# Patient Record
Sex: Male | Born: 1955 | Race: White | Hispanic: No | Marital: Married | Smoking: Never smoker
Health system: Southern US, Community
[De-identification: ages and names within clinical notes are randomized; demographics above are authoritative.]

## PROBLEM LIST (undated history)

## (undated) DIAGNOSIS — E785 Hyperlipidemia, unspecified: Secondary | ICD-10-CM

## (undated) DIAGNOSIS — I10 Essential (primary) hypertension: Secondary | ICD-10-CM

## (undated) DIAGNOSIS — I219 Acute myocardial infarction, unspecified: Secondary | ICD-10-CM

## (undated) DIAGNOSIS — E663 Overweight: Secondary | ICD-10-CM

## (undated) DIAGNOSIS — R361 Hematospermia: Secondary | ICD-10-CM

## (undated) DIAGNOSIS — T8859XA Other complications of anesthesia, initial encounter: Secondary | ICD-10-CM

## (undated) DIAGNOSIS — N529 Male erectile dysfunction, unspecified: Secondary | ICD-10-CM

## (undated) DIAGNOSIS — F32A Depression, unspecified: Secondary | ICD-10-CM

## (undated) DIAGNOSIS — F319 Bipolar disorder, unspecified: Secondary | ICD-10-CM

## (undated) DIAGNOSIS — D352 Benign neoplasm of pituitary gland: Secondary | ICD-10-CM

## (undated) DIAGNOSIS — Z87442 Personal history of urinary calculi: Secondary | ICD-10-CM

## (undated) DIAGNOSIS — M1990 Unspecified osteoarthritis, unspecified site: Secondary | ICD-10-CM

## (undated) DIAGNOSIS — I2510 Atherosclerotic heart disease of native coronary artery without angina pectoris: Secondary | ICD-10-CM

## (undated) HISTORY — DX: Hematospermia: R36.1

## (undated) HISTORY — DX: Male erectile dysfunction, unspecified: N52.9

## (undated) HISTORY — DX: Overweight: E66.3

## (undated) HISTORY — PX: KNEE SURGERY: SHX244

## (undated) HISTORY — DX: Atherosclerotic heart disease of native coronary artery without angina pectoris: I25.10

## (undated) HISTORY — DX: Hyperlipidemia, unspecified: E78.5

## (undated) HISTORY — DX: Benign neoplasm of pituitary gland: D35.2

## (undated) HISTORY — DX: Essential (primary) hypertension: I10

---

## 1997-09-30 DIAGNOSIS — D352 Benign neoplasm of pituitary gland: Secondary | ICD-10-CM

## 1997-09-30 HISTORY — DX: Benign neoplasm of pituitary gland: D35.2

## 2001-09-30 HISTORY — PX: REFRACTIVE SURGERY: SHX103

## 2001-09-30 HISTORY — PX: VASECTOMY: SHX75

## 2005-09-30 HISTORY — PX: OTHER SURGICAL HISTORY: SHX169

## 2006-09-30 HISTORY — PX: COLONOSCOPY: SHX174

## 2008-09-30 HISTORY — PX: HERNIA REPAIR: SHX51

## 2011-06-05 ENCOUNTER — Other Ambulatory Visit: Payer: Self-pay | Admitting: Endocrinology

## 2011-06-05 DIAGNOSIS — D352 Benign neoplasm of pituitary gland: Secondary | ICD-10-CM

## 2012-09-30 DIAGNOSIS — I10 Essential (primary) hypertension: Secondary | ICD-10-CM

## 2012-09-30 HISTORY — DX: Essential (primary) hypertension: I10

## 2012-09-30 HISTORY — PX: CARDIAC CATHETERIZATION: SHX172

## 2013-01-27 ENCOUNTER — Encounter: Payer: Self-pay | Admitting: *Deleted

## 2013-01-27 ENCOUNTER — Ambulatory Visit (INDEPENDENT_AMBULATORY_CARE_PROVIDER_SITE_OTHER): Payer: 59 | Admitting: Cardiology

## 2013-01-27 ENCOUNTER — Encounter: Payer: Self-pay | Admitting: Cardiology

## 2013-01-27 VITALS — BP 144/87 | HR 78 | Ht 70.0 in | Wt 242.0 lb

## 2013-01-27 DIAGNOSIS — R079 Chest pain, unspecified: Secondary | ICD-10-CM

## 2013-01-27 DIAGNOSIS — Z01818 Encounter for other preprocedural examination: Secondary | ICD-10-CM

## 2013-01-27 DIAGNOSIS — I208 Other forms of angina pectoris: Secondary | ICD-10-CM

## 2013-01-27 DIAGNOSIS — I10 Essential (primary) hypertension: Secondary | ICD-10-CM

## 2013-01-27 DIAGNOSIS — D352 Benign neoplasm of pituitary gland: Secondary | ICD-10-CM

## 2013-01-27 NOTE — Patient Instructions (Addendum)
Your physician recommends that you schedule a follow-up appointment in: TO BE DETERMINED FROM TEST  Your physician has requested that you have a stress echocardiogram. For further information please visit [email or web address] Please follow instruction sheet as given.WE WILL CALL YOU WITH RESULTS/NEXT STEPS  Your physician has requested that you have a cardiac catheterization. Cardiac catheterization is used to diagnose and/or treat various heart conditions. Doctors may recommend this procedure for a number of different reasons. The most common reason is to evaluate chest pain. Chest pain can be a symptom of coronary artery disease (CAD), and cardiac catheterization can show whether plaque is narrowing or blocking your heart's arteries. This procedure is also used to evaluate the valves, as well as measure the blood flow and oxygen levels in different parts of your heart. For further information please visit [email or web address] Please follow instruction sheet, as given.  Your physician recommends that you return for lab work in: this week (cbc, bmet,pt, pt/inr) slips printed

## 2013-01-27 NOTE — Progress Notes (Deleted)
Name: Jesse Coleman    DOB: 1956-04-05  Age: 57 y.o.  MR#: [id number]       PCP:  Michiel Sites, MD      Insurance: Payor: Cleatrice Burke  Plan: [place] [employer]  Product Type: *No Product type*    CC:   No chief complaint on file. LIST  VS Filed Vitals:   01/27/13 1317  BP: 144/87  Pulse: 78  Height: 5\' 10"  (1.778 m)  Weight: 242 lb (109.77 kg)    Weights Current Weight  01/27/13 242 lb (109.77 kg)    Blood Pressure  BP Readings from Last 3 Encounters:  01/27/13 144/87     Admit date:  (Not on file) Last encounter with RMR:  Visit date not found   Allergy Latex  Current Outpatient Prescriptions  Medication Sig Dispense Refill  . aspirin EC 81 MG tablet Take 81 mg by mouth daily.      Marland Kitchen atorvastatin (LIPITOR) 40 MG tablet Take 40 mg by mouth daily.       . bromocriptine (PARLODEL) 2.5 MG tablet Take 2.5 mg by mouth daily.       Marland Kitchen LORazepam (ATIVAN) 1 MG tablet Take 1 mg by mouth every 6 (six) hours as needed.       . naproxen (NAPROSYN) 500 MG tablet Take 500 mg by mouth 2 (two) times daily with a meal.      . NITROSTAT 0.4 MG SL tablet Place 0.4 mg under the tongue every 5 (five) minutes as needed.       . vardenafil (LEVITRA) 10 MG tablet Take 10 mg by mouth daily as needed.       No current facility-administered medications for this visit.    Discontinued Meds:   There are no discontinued medications.  There are no active problems to display for this patient.   LABS No results found for this basename: na, k, cl, co2, glucose, bun, creatinine, calcium, gfrnonaa, gfraa   CMP  No results found for this basename: na, k, cl, co2, glucose, bun, creatinine, calcium, prot, albumin, ast, alt, alkphos, bilitot, gfrnonaa, gfraa    No results found for this basename: wbc, hgb, hct, mcv, platelets    Lipid Panel  No results found for this basename: chol, trig, hdl, cholhdl, vldl, ldlcalc    ABG No results found for this basename: phart, pco2, pco2art,  po2, po2art, hco3, tco2, acidbasedef, o2sat     No results found for this basename: TSH   BNP (last 3 results) No results found for this basename: PROBNP,  in the last 8760 hours Cardiac Panel (last 3 results) No results found for this basename: CKTOTAL, CKMB, TROPONINI, RELINDX,  in the last 72 hours  Iron/TIBC/Ferritin No results found for this basename: iron, tibc, ferritin     EKG No orders found for this or any previous visit.   Prior Assessment and Plan    Imaging: No results found.

## 2013-01-28 ENCOUNTER — Encounter (HOSPITAL_COMMUNITY): Payer: Self-pay | Admitting: Cardiology

## 2013-01-28 ENCOUNTER — Ambulatory Visit (HOSPITAL_COMMUNITY)
Admission: RE | Admit: 2013-01-28 | Discharge: 2013-01-28 | Disposition: A | Discharge status: Home / Self Care | Payer: 59 | Source: Ambulatory Visit | Attending: Cardiology | Admitting: Cardiology

## 2013-01-28 ENCOUNTER — Encounter: Payer: Self-pay | Admitting: Cardiology

## 2013-01-28 DIAGNOSIS — R079 Chest pain, unspecified: Secondary | ICD-10-CM | POA: Insufficient documentation

## 2013-01-28 DIAGNOSIS — I10 Essential (primary) hypertension: Secondary | ICD-10-CM | POA: Insufficient documentation

## 2013-01-28 DIAGNOSIS — D352 Benign neoplasm of pituitary gland: Secondary | ICD-10-CM | POA: Insufficient documentation

## 2013-01-28 DIAGNOSIS — I209 Angina pectoris, unspecified: Secondary | ICD-10-CM | POA: Insufficient documentation

## 2013-01-28 NOTE — Progress Notes (Signed)
Stress Lab Nurses Notes - [facility] 01/28/2013 Reason for doing test: Chest Pain Type of test: Stress Echo Nurse performing test: Parke Poisson, RN Nuclear Medicine Tech: Not Applicable Echo Tech: Karrie Doffing MD performing test: R. Dietrich Pates Family MD: Dr. Juleen China Test explained and consent signed: yes IV started: No IV started Symptoms: Chest pain & SOB Treatment/Intervention: None Reason test stopped: fatigue After recovery IV was: NA Patient to return to Nuc. Med at : NA Patient discharged: Home Patient's Condition upon discharge was: stable Comments: During test peak BP 202/82 & HR 141.  Recovery BP 148/88 & HR 88. Symptoms resolved in recovery. Erskine Speed T

## 2013-01-28 NOTE — Progress Notes (Signed)
Patient ID: Jesse Coleman, male   DOB: [birth year], 57 y.o.   MRN: [id number] HPI: Initial Cardiology evaluation for this very nice gentleman referred by Jesse Needle, MD for assessment of exertional angina.  Patient describes a fairly classic history with burning mid substernal chest discomfort elicited reliably by moderate exercise and relieved promptly with rest. He has taken nitroglycerin with perhaps some benefit, but not immediate relief. He denies radiation of discomfort, associated symptoms or any GI symptomatology or history. He has not previously been evaluated by cardiologists except just recently by an associate of Dr. Jacinto Coleman.  Jesse Coleman felt that he is being pressured to undergo cardiac catheterization and is now seeking a second opinion. Symptoms have been present for approximately 6 months and have not been progressive.  Current Outpatient Prescriptions  Medication Sig Dispense Refill  . aspirin EC 81 MG tablet Take 81 mg by mouth daily.      Marland Kitchen atorvastatin (LIPITOR) 40 MG tablet Take 40 mg by mouth daily.       . bromocriptine (PARLODEL) 2.5 MG tablet Take 2.5 mg by mouth daily.       Marland Kitchen LORazepam (ATIVAN) 1 MG tablet Take 1 mg by mouth every 6 (six) hours as needed.       . naproxen (NAPROSYN) 500 MG tablet Take 500 mg by mouth 2 (two) times daily with a meal.      . NITROSTAT 0.4 MG SL tablet Place 0.4 mg under the tongue every 5 (five) minutes as needed.       . vardenafil (LEVITRA) 10 MG tablet Take 10 mg by mouth daily as needed.       No current facility-administered medications for this visit.   Allergies  Allergen Reactions  . Latex     Past Medical History  Diagnosis Date  . Chest pain     History reviewed. No pertinent past surgical history.  History reviewed. No pertinent family history.  History   Social History  . Marital Status: Married    Spouse Name: N/A    Number of Children: N/A  . Years of Education: N/A   Occupational History  . Not on file.   Social  History Main Topics  . Smoking status: Never Smoker   . Smokeless tobacco: Not on file  . Alcohol Use: No  . Drug Use: No  . Sexually Active: Not on file   Other Topics Concern  . Not on file   Social History Narrative  . No narrative on file    ROS: Denies orthopnea, PND, palpitations, lightheadedness or syncope..  All other systems reviewed and are negative.  PHYSICAL EXAM: BP 144/87  Pulse 78  Ht 5\' 10"  (1.778 m)  Wt 109.77 kg (242 lb)  BMI 34.72 kg/m2;  Body mass index is 34.72 kg/(m^2).   General-Well-developed; no acute distress Body Habitus-proportionate weight and height HEENT-[place]/AT; PERRL; EOM intact; conjunctiva and lids nl Neck-No JVD; no carotid bruits Endocrine-No thyromegaly Lungs-Clear lung fields; resonant percussion; normal I-to-E ratio Cardiovascular- normal PMI; normal S1 and S2; systolic ejection murmur Abdomen-BS normal; soft and non-tender without masses or organomegaly Musculoskeletal-No deformities, cyanosis or clubbing Neurologic-Nl cranial nerves; symmetric strength and tone Skin- Warm, no significant lesions Extremities-Nl distal pulses; no edema  EKG:  Tracing performed 01/18/13 obtained and reviewed. Normal sinus rhythm present; borderline left atrial abnormality; slightly delayed R-wave progression; T-wave inversion in leads V3-V4 raising the question of anterior ischemia. No previous tracing for comparison.  Medicine Bow Bing, MD 01/28/2013  9:29 AM  ASSESSMENT AND PLAN

## 2013-01-28 NOTE — Assessment & Plan Note (Addendum)
Current symptoms are highly suggestive of classic angina and thus suggest a very high likelihood of coronary artery disease. The pathophysiology of CAD and implications of developing it were discussed at length with patient and his wife. Possible next steps for testing including stress testing and cardiac catheterization were also considered in detail. Patient is very cognizant of the premature death of his father at an age only one year greater than his current age, and is inclined to ultimately undergo coronary angiography; however, he would feel more comfortable starting with a stress test, which will be performed within the next few days.  I anticipate that the results of that study will be positive then we will proceed expeditiously to an outpatient catheterization. In the meantime, cardiovascular risk factors will be controlled. He's recently been started on moderate doses of atorvastatin, which should be adequate. He is taking aspirin daily. Criteria for pharmacologic treatment of hypertension have been borderline, but a beta blocker will be added once stress testing has been completed.

## 2013-01-28 NOTE — Progress Notes (Signed)
*  PRELIMINARY RESULTS* Echocardiogram 2D Echocardiogram has been performed.  Conrad Atwood 01/28/2013, 10:38 AM

## 2013-01-29 ENCOUNTER — Ambulatory Visit (HOSPITAL_COMMUNITY)
Admission: RE | Admit: 2013-01-29 | Discharge: 2013-01-29 | Disposition: A | Discharge status: Home / Self Care | Payer: 59 | Source: Ambulatory Visit | Attending: Cardiology | Admitting: Cardiology

## 2013-01-29 ENCOUNTER — Encounter: Payer: Self-pay | Admitting: *Deleted

## 2013-01-29 ENCOUNTER — Telehealth: Payer: Self-pay | Admitting: *Deleted

## 2013-01-29 DIAGNOSIS — R079 Chest pain, unspecified: Secondary | ICD-10-CM | POA: Insufficient documentation

## 2013-01-29 NOTE — Telephone Encounter (Signed)
Spoke to pt to advise results/instructions. Pt understood. Pt stated he would come to the office to pick up the packet and have tests performed

## 2013-01-29 NOTE — Telephone Encounter (Signed)
.  left message to have patient return my call to advise Dr RR would like for him to have a Left Heart Cath with [facility] on 02-02-13 at 7:30am, with Dr Dolphus Jenny, pt will need to pick up instruction letter, lab slips and chest x ray slip to have performed today or tomorrow to allow time for results to come back prior to cath apt, placed pt packet at front desk for pick up

## 2013-01-29 NOTE — Addendum Note (Signed)
Addended by: Derry Lory A on: 01/29/2013 12:05 PM   Modules accepted: Orders

## 2013-01-30 LAB — BASIC METABOLIC PANEL
BUN: 13 mg/dL (ref 6–23)
CO2: 26 mEq/L (ref 19–32)
Calcium: 9.4 mg/dL (ref 8.4–10.5)
Chloride: 106 mEq/L (ref 96–112)
Creat: 1.19 mg/dL (ref 0.50–1.35)
Glucose, Bld: 87 mg/dL (ref 70–99)
Potassium: 4.2 mEq/L (ref 3.5–5.3)
Sodium: 139 mEq/L (ref 135–145)

## 2013-01-30 LAB — CBC
HCT: 43 % (ref 39.0–52.0)
Hemoglobin: 15.1 g/dL (ref 13.0–17.0)
MCH: 30.5 pg (ref 26.0–34.0)
MCHC: 35.1 g/dL (ref 30.0–36.0)
MCV: 86.9 fL (ref 78.0–100.0)
Platelets: 178 10*3/uL (ref 150–400)
RBC: 4.95 MIL/uL (ref 4.22–5.81)
RDW: 13.5 % (ref 11.5–15.5)
WBC: 5.7 10*3/uL (ref 4.0–10.5)

## 2013-01-30 LAB — PROTIME-INR
INR: 1.05 (ref <1.50)
Prothrombin Time: 13.7 seconds (ref 11.6–15.2)

## 2013-01-30 LAB — APTT: aPTT: 47 seconds — ABNORMAL HIGH (ref 24–37)

## 2013-02-01 ENCOUNTER — Encounter: Payer: Self-pay | Admitting: *Deleted

## 2013-02-02 ENCOUNTER — Encounter (HOSPITAL_BASED_OUTPATIENT_CLINIC_OR_DEPARTMENT_OTHER): Payer: Self-pay | Admitting: *Deleted

## 2013-02-02 ENCOUNTER — Telehealth: Payer: Self-pay | Admitting: Physician Assistant

## 2013-02-02 ENCOUNTER — Inpatient Hospital Stay (HOSPITAL_BASED_OUTPATIENT_CLINIC_OR_DEPARTMENT_OTHER)
Admission: RE | Admit: 2013-02-02 | Discharge: 2013-02-02 | Disposition: A | Discharge status: Home / Self Care | Payer: 59 | Source: Ambulatory Visit | Attending: Cardiovascular Disease | Admitting: Cardiovascular Disease

## 2013-02-02 ENCOUNTER — Encounter (HOSPITAL_BASED_OUTPATIENT_CLINIC_OR_DEPARTMENT_OTHER)
Admission: RE | Disposition: A | Discharge status: Home / Self Care | Payer: Self-pay | Source: Ambulatory Visit | Attending: Cardiovascular Disease

## 2013-02-02 DIAGNOSIS — I2510 Atherosclerotic heart disease of native coronary artery without angina pectoris: Secondary | ICD-10-CM | POA: Insufficient documentation

## 2013-02-02 DIAGNOSIS — I519 Heart disease, unspecified: Secondary | ICD-10-CM | POA: Insufficient documentation

## 2013-02-02 DIAGNOSIS — I10 Essential (primary) hypertension: Secondary | ICD-10-CM | POA: Diagnosis present

## 2013-02-02 DIAGNOSIS — R079 Chest pain, unspecified: Secondary | ICD-10-CM | POA: Diagnosis present

## 2013-02-02 DIAGNOSIS — I209 Angina pectoris, unspecified: Secondary | ICD-10-CM | POA: Insufficient documentation

## 2013-02-02 SURGERY — JV LEFT HEART CATHETERIZATION WITH CORONARY ANGIOGRAM
Anesthesia: Moderate Sedation

## 2013-02-02 MED ORDER — ONDANSETRON HCL 4 MG/2ML IJ SOLN: 4.0000 mg | Freq: Four times a day (QID) | INTRAMUSCULAR | Status: DC | PRN

## 2013-02-02 MED ORDER — ASPIRIN 81 MG PO CHEW
324.0000 mg | CHEWABLE_TABLET | ORAL | Status: AC
  Administered 2013-02-02: 324 mg via ORAL

## 2013-02-02 MED ORDER — SODIUM CHLORIDE 0.9 % IV SOLN: 250.0000 mL | INTRAVENOUS | Status: DC | PRN

## 2013-02-02 MED ORDER — SODIUM CHLORIDE 0.9 % IV SOLN: INTRAVENOUS | Status: AC

## 2013-02-02 MED ORDER — ACETAMINOPHEN 325 MG PO TABS: 650.0000 mg | ORAL_TABLET | ORAL | Status: DC | PRN

## 2013-02-02 MED ORDER — SODIUM CHLORIDE 0.9 % IJ SOLN: 3.0000 mL | INTRAMUSCULAR | Status: DC | PRN

## 2013-02-02 MED ORDER — SODIUM CHLORIDE 0.9 % IJ SOLN: 3.0000 mL | Freq: Two times a day (BID) | INTRAMUSCULAR | Status: DC

## 2013-02-02 MED ORDER — SODIUM CHLORIDE 0.9 % IV SOLN
INTRAVENOUS | Status: DC
  Administered 2013-02-02: 08:00:00 via INTRAVENOUS

## 2013-02-02 NOTE — Interval H&P Note (Signed)
History and Physical Interval Note:  02/02/2013 8:17 AM  Jesse Coleman  has presented today for cardiac cath with abnormal stress echo, UA. Possible PCI.  The various methods of treatment have been discussed with the patient and family. After consideration of risks, benefits and other options for treatment, the patient has consented to  Procedure(s): JV LEFT HEART CATHETERIZATION WITH CORONARY ANGIOGRAM (N/A) as a surgical intervention .  The patient's history has been reviewed, patient examined, no change in status, stable for surgery.  I have reviewed the patient's chart and labs.  Questions were answered to the patient's satisfaction.     MCALHANY,CHRISTOPHER

## 2013-02-02 NOTE — CV Procedure (Signed)
Cardiac Catheterization Operative Report  Jesse Coleman [id number] 5/6/20148:58 AM Michiel Sites, MD  Procedure Performed:  1. Left Heart Catheterization 2. Selective Coronary Angiography 3. Left ventricular angiogram  Operator: Verne Carrow, MD  Indication:   57 yo male with history of recent chest pains with exertion. Stress echo with LVEF 40%, no worsening of EF with exercise but possible wall motion abnormalities with exercise. Cardiac cath to exclude obstructive CAD.                                    Procedure Details: The risks, benefits, complications, treatment options, and expected outcomes were discussed with the patient. The patient and/or family concurred with the proposed plan, giving informed consent. The patient was brought to the cath lab after IV hydration was begun and oral premedication was given. The patient was further sedated with Versed and Fentanyl. The right groin was prepped and draped in the usual manner. Using the modified Seldinger access technique, a 5 French sheath was placed in the right femoral artery. The RCA had a high, anterior takeoff and was engaged with an AL-1 catheter. The left main was engaged with a JL5 catheter. Selective angiography was performed. A pigtail catheter was used to perform a left ventricular angiogram.  There were no immediate complications. The patient was taken to the recovery area in stable condition.   Hemodynamic Findings: Central aortic pressure: 131/68 Left ventricular pressure: 135/12/23  Angiographic Findings:  Left main: No obstructive disease.   Left Anterior Descending Artery: Large caliber vessel that courses to the apex. The entire proximal and mid vessel has severe calcification. There is diffuse 40% stenosis throughout the proximal and mid vessel but this does not appear to be flow limiting. The distal LAD becomes smaller in caliber and has diffuse 30% stenosis. There are 3 very small caliber  diagonal branches. The first two diagonal branches are 1.0 mm in diameter and each has 70-80% ostial stenosis (too small for PCI).   Circumflex Artery: Large caliber vessel with 20% proximal stenosis. The AV groove Circumflex leads into a moderate caliber left sided posterolateral branch with mild plaque. The first obtuse marginal branch is very small in caliber (1. 0 mm) and has diffuse 90% proximal stenosis. The second obtuse marginal branch is very small in caliber and has severe distal stenosis. (too small for PCI). The third obtuse marginal branch is small to moderate in caliber and has proximal 50% stenosis, mod 40% stenosis.   Right Coronary Artery: Small caliber, non-dominant vessel with 90% proximal stenosis. This vessel appears to be 1.5-1.75 mm in caliber (too small for PCI)  Left Ventricular Angiogram: LVEF 40-45%, global hypokinesis.   Impression: 1. 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 The RCA is small and non-dominant. The branches with high grade disease are too small for PCI.  2. Mild to moderate LV systolic dysfunction  Recommendations: His major epicardial vessels are free of flow limiting disease. His angina is likely from the high grade disease in the very small caliber branch vessels. Will continue medical management. Will add Imdur 30 mg po QHS and Toprol 50 mg po Qdaily. He will need to stop using Levitra while he is on Imdur.        Complications:  None. The patient tolerated the procedure well.

## 2013-02-02 NOTE — Progress Notes (Signed)
Bedrest begins @ 0910, tegaderm dressing applied to right groin bt Army Melia, site level 0.

## 2013-02-02 NOTE — Telephone Encounter (Addendum)
Patient had cardiac catheterization today and was discharged home. He coughed this evening and about a half hour ago, his wife noticed purple ecchymosis coming out very slightly (<1cm) from the top corner and bottom corner of the R side of the gauze. The gauze itself is completely white. There is no seepage of blood. They have not seen any groin swelling. He has no groin pain/discomfort, dizziness, lightheadedness or any other symptoms. He was instructed to leave pressure dressing on until morning so they have not disturbed it. This ecchymosis is nonpalpable and has not changed within the last half hour. She volunteered without any request to send in a picture which we reviewed and promptly deleted, appears to be ecchymosis flush with the skin. I advised that he should remain on bedrest for this evening, avoid any lifting whatsoever, try to avoid coughing, and if he does cough, to hold pressure on his groin and cough very gently. If there is any progression of bruising, any change in being able to palpate the bruising, any groin swelling, staining of gauze with blood, dizziness, groin pain, back/abdominal pain, or any other symptoms concerning to them, they are to proceed to ED. I also told her that if she is welcome to bring him to the ER regardless to have it looked at. She has elected to remain at home with very careful monitoring of the catheterization site, and if there are any changes, they know to go to the ER. She will keep the pressure dressing in place. I also told her to please call our office in the morning to let us know how he is doing. She verbalized understanding and gratitude. Dayna Dunn PA-C   ADDENDUM 02/03/13:  Called patient to see how he was doing. Ecchymosis was marginally larger this morning compared to last night, but no further progression since this morning. They took his bandage off and there was no oozing or swelling. No groin pain. He is taking it easy. I advised him again of the  warning signs above, but told him to continue to monitor and we should hope for improvement. He knows to call back if there is any change from current state. Dayna Dunn PA-C

## 2013-02-02 NOTE — H&P (View-Only) (Signed)
Patient ID: Jesse Coleman, male   DOB: 10/17/1955, 57 y.o.   MRN: [id number] HPI: Initial Cardiology evaluation for this very nice gentleman referred by Walter Kohut, MD for assessment of exertional angina.  Patient describes a fairly classic history with burning mid substernal chest discomfort elicited reliably by moderate exercise and relieved promptly with rest. He has taken nitroglycerin with perhaps some benefit, but not immediate relief. He denies radiation of discomfort, associated symptoms or any GI symptomatology or history. He has not previously been evaluated by cardiologists except just recently by an associate of Dr. Ganji.  Kendrew felt that he is being pressured to undergo cardiac catheterization and is now seeking a second opinion. Symptoms have been present for approximately 6 months and have not been progressive.  Current Outpatient Prescriptions  Medication Sig Dispense Refill  . aspirin EC 81 MG tablet Take 81 mg by mouth daily.      . atorvastatin (LIPITOR) 40 MG tablet Take 40 mg by mouth daily.       . bromocriptine (PARLODEL) 2.5 MG tablet Take 2.5 mg by mouth daily.       . LORazepam (ATIVAN) 1 MG tablet Take 1 mg by mouth every 6 (six) hours as needed.       . naproxen (NAPROSYN) 500 MG tablet Take 500 mg by mouth 2 (two) times daily with a meal.      . NITROSTAT 0.4 MG SL tablet Place 0.4 mg under the tongue every 5 (five) minutes as needed.       . vardenafil (LEVITRA) 10 MG tablet Take 10 mg by mouth daily as needed.       No current facility-administered medications for this visit.   Allergies  Allergen Reactions  . Latex     Past Medical History  Diagnosis Date  . Chest pain     History reviewed. No pertinent past surgical history.  History reviewed. No pertinent family history.  History   Social History  . Marital Status: Married    Spouse Name: N/A    Number of Children: N/A  . Years of Education: N/A   Occupational History  . Not on file.   Social  History Main Topics  . Smoking status: Never Smoker   . Smokeless tobacco: Not on file  . Alcohol Use: No  . Drug Use: No  . Sexually Active: Not on file   Other Topics Concern  . Not on file   Social History Narrative  . No narrative on file    ROS: Denies orthopnea, PND, palpitations, lightheadedness or syncope..  All other systems reviewed and are negative.  PHYSICAL EXAM: BP 144/87  Pulse 78  Ht 5' 10" (1.778 m)  Wt 109.77 kg (242 lb)  BMI 34.72 kg/m2;  Body mass index is 34.72 kg/(m^2).   General-Well-developed; no acute distress Body Habitus-proportionate weight and height HEENT-[place]/AT; PERRL; EOM intact; conjunctiva and lids nl Neck-No JVD; no carotid bruits Endocrine-No thyromegaly Lungs-Clear lung fields; resonant percussion; normal I-to-E ratio Cardiovascular- normal PMI; normal S1 and S2; systolic ejection murmur Abdomen-BS normal; soft and non-tender without masses or organomegaly Musculoskeletal-No deformities, cyanosis or clubbing Neurologic-Nl cranial nerves; symmetric strength and tone Skin- Warm, no significant lesions Extremities-Nl distal pulses; no edema  EKG:  Tracing performed 01/18/13 obtained and reviewed. Normal sinus rhythm present; borderline left atrial abnormality; slightly delayed R-wave progression; T-wave inversion in leads V3-V4 raising the question of anterior ischemia. No previous tracing for comparison.  Brynn Reznik, MD 01/28/2013  9:29 AM    ASSESSMENT AND PLAN  

## 2013-02-02 NOTE — Progress Notes (Signed)
Pt alert and oriented.  Dr Alyson Ingles in to talk to pt and family about the results of test and plan of care.  New RX given to CG for pt.

## 2013-02-05 ENCOUNTER — Emergency Department (HOSPITAL_COMMUNITY)
Admission: EM | Admit: 2013-02-05 | Discharge: 2013-02-05 | Disposition: A | Discharge status: Home / Self Care | Payer: 59 | Attending: Emergency Medicine | Admitting: Emergency Medicine

## 2013-02-05 ENCOUNTER — Encounter (HOSPITAL_COMMUNITY): Payer: Self-pay | Admitting: Emergency Medicine

## 2013-02-05 ENCOUNTER — Telehealth: Payer: Self-pay | Admitting: Physician Assistant

## 2013-02-05 ENCOUNTER — Telehealth: Payer: Self-pay | Admitting: Cardiology

## 2013-02-05 DIAGNOSIS — IMO0002 Reserved for concepts with insufficient information to code with codable children: Secondary | ICD-10-CM | POA: Insufficient documentation

## 2013-02-05 DIAGNOSIS — Z7982 Long term (current) use of aspirin: Secondary | ICD-10-CM | POA: Insufficient documentation

## 2013-02-05 DIAGNOSIS — Y838 Other surgical procedures as the cause of abnormal reaction of the patient, or of later complication, without mention of misadventure at the time of the procedure: Secondary | ICD-10-CM | POA: Insufficient documentation

## 2013-02-05 DIAGNOSIS — Z9889 Other specified postprocedural states: Secondary | ICD-10-CM | POA: Insufficient documentation

## 2013-02-05 DIAGNOSIS — Z79899 Other long term (current) drug therapy: Secondary | ICD-10-CM | POA: Insufficient documentation

## 2013-02-05 DIAGNOSIS — I10 Essential (primary) hypertension: Secondary | ICD-10-CM | POA: Insufficient documentation

## 2013-02-05 DIAGNOSIS — Z87448 Personal history of other diseases of urinary system: Secondary | ICD-10-CM | POA: Insufficient documentation

## 2013-02-05 DIAGNOSIS — T819XXA Unspecified complication of procedure, initial encounter: Secondary | ICD-10-CM

## 2013-02-05 DIAGNOSIS — E669 Obesity, unspecified: Secondary | ICD-10-CM | POA: Insufficient documentation

## 2013-02-05 DIAGNOSIS — T148XXA Other injury of unspecified body region, initial encounter: Secondary | ICD-10-CM

## 2013-02-05 NOTE — ED Provider Notes (Signed)
History     CSN: [id number]  Arrival date & time 02/05/13  1211   First MD Initiated Contact with Patient 02/05/13 1231      Chief Complaint  Patient presents with  . Bleeding/Bruising    (Consider location/radiation/quality/duration/timing/severity/associated sxs/prior treatment) HPI  57 y/o male presents to the ed with concern for his surgical site  After cardiac catheterization on May 6 of 2014.  Patient denies any pain, swelling, tenderness, discharge from the surgical site.  The patient denies any numbness or tingling in his leg.  He denies any shortness of breath, fever, chest pain, nausea, vomiting or diaphoresis.  It does have some abrasion and bruising which has spread laterally out from the groin and over the hip.  Patient states that he was working out yesterday.  He will one-mile outside and then walked 1 mile on the treadmill at a quick pace.  Denies any chest pain or burning at that time.  Patient was concerned that because of the increased bruising the clot at the site of incision may have dislodged.  Past Medical History  Diagnosis Date  . Chest pain     12/2012: Nl CXR  . Hypertension 2014    Lipid profile in 12/2012:190, 104, 65, 104; recent blood pressures marginally elevated  . Prolactin-secreting pituitary adenoma 1999    Medical therapy-1999  . Erectile dysfunction   . Overweight   . Hematospermia   . H/O knee surgery 1989    Past Surgical History  Procedure Laterality Date  . Hernia repair  [address] [place]  . Colonoscopy  [address], [place]  . Dental implant  2007  . Refractive surgery Right 2003  . Vasectomy  2003    Family History  Problem Relation Age of Onset  . Coronary artery disease Father     History  Substance Use Topics  . Smoking status: Never Smoker   . Smokeless tobacco: Not on file  . Alcohol Use: 0.5 oz/week    1 drink(s) per week      Review of Systems Ten systems reviewed and are negative for  acute change, except as noted in the HPI.   Allergies  Latex  Home Medications   Current Outpatient Rx  Name  Route  Sig  Dispense  Refill  . aspirin EC 81 MG tablet   Oral   Take 81 mg by mouth daily.         Marland Kitchen atorvastatin (LIPITOR) 40 MG tablet   Oral   Take 40 mg by mouth daily.          . bromocriptine (PARLODEL) 2.5 MG tablet   Oral   Take 2.5 mg by mouth daily.          Marland Kitchen LORazepam (ATIVAN) 1 MG tablet   Oral   Take 1 mg by mouth every 6 (six) hours as needed.          . naproxen (NAPROSYN) 500 MG tablet   Oral   Take 500 mg by mouth 2 (two) times daily with a meal.         . NITROSTAT 0.4 MG SL tablet   Sublingual   Place 0.4 mg under the tongue every 5 (five) minutes as needed.            BP 134/78  Pulse 78  Temp(Src) 98.5 F (36.9 C)  Resp 16  SpO2 99%  Physical Exam  Nursing note and vitals reviewed.  Constitutional: He appears well-developed and well-nourished. No distress.  HENT:  Head: Normocephalic and atraumatic.  Eyes: Conjunctivae are normal. No scleral icterus.  Neck: Normal range of motion. Neck supple.  Cardiovascular: Normal rate, regular rhythm and normal heart sounds.   Pulmonary/Chest: Effort normal and breath sounds normal. No respiratory distress.  Abdominal: Soft. There is no tenderness.  Genitourinary:     Musculoskeletal: He exhibits no edema.  Neurological: He is alert.  Skin: Skin is warm and dry. He is not diaphoretic.  Psychiatric: His behavior is normal.    ED Course  Procedures (including critical care time)  Labs Reviewed - No data to display No results found.   No diagnosis found.    MDM  1:19 PM BP 137/85  Pulse 63  Temp(Src) 98.5 F (36.9 C)  Resp 16  SpO2 97% Patient with bruising at site of cath in t right groin no pai. No concern for hematoma, seroma, or pseudoaneurysm. No cp/ SOB. No signs of infection. I have advisted tha patient that he should not be exercising unless directed  otherwise by his cardiologist.   The patient appears reasonably screened and/or stabilized for discharge and I doubt any other medical condition or other [facility] requiring further screening, evaluation, or treatment in the ED at this time prior to discharge.        Arthor Captain, PA-C 02/05/13 1321

## 2013-02-05 NOTE — Telephone Encounter (Addendum)
Pt's wife contacted me on my cell phone this morning (which was the phone I had used to call her back the night I was on call) to inquire how to be seen for persistent groin bruising. I attempted to call her back at the number provided but got voicemail twice. On second attempt, I left message instructing her to contact the [place] office for further instructions as he may be able to be seen in office today. Dayna Dunn PA-C  Addendum: was able to reach wife. They have called the [place] office and are awaiting further instructions from their office. Dayna Dunn PA-C

## 2013-02-05 NOTE — Telephone Encounter (Signed)
See prior phone note. Pt's wife contacted me on my cell phone this morning (which was the phone I had used to call her back the night I was on call) to inquire how to be seen for persistent groin bruising. She contacted the  office as I instructed but was waiting to hear back. I then received a followup text and picture of bruising to my cell phone, which the patient stated was per our office's instructions. I spoke with Cala Bradford at the [place] office to confirm that he definitely needs to be seen today to lay the issue to rest since his bruising has persisted and extended. I also explained to the patient's wife that because our cell phones are not linked to the medical record, we generally do not communicate with patients via cell phone/text unless absolutely necessary, thus I will defer further communication to the [place] office. Cala Bradford will speak with Dr. Tenny Craw regarding possible appointment today vs going to ER to be seen. Lashaya Kienitz PA-C

## 2013-02-05 NOTE — ED Notes (Signed)
Had a cardiac cath on 5/6 now having bruising and swelling rt groin pain

## 2013-02-05 NOTE — Telephone Encounter (Signed)
PT HAS CATH ON 02/02/13 AND THE BRUISING IS SPREADING FROM SIGHT TO ACROSS THE HIP AND IN THROUGH THE THIGH AREA, DEEP DARK COLOR. NO PAIN. NO FEVER. JUST VERY BIG.

## 2013-02-05 NOTE — Telephone Encounter (Signed)
Pt wife denies chest pain, SOB,no other sxs, notes BP is running normal, notes bruise is traveling linear, notes this started the night of the procedure, noted inch and a half from port area, discussed with Annabelle Harman on 02-02-13 after discharge and picture sent to [facility] via text, this nurse called Annabelle Harman and she reviewed the picture via test that was seen a few minutes ago, noted pt needs to be seen today and was advised if no apt available for the pt to be seen in the ED, called pt wife and advised our schedule is full and Annabelle Harman reviewed recent picture and advised if pt can not be seen pt needs to evaluated today in the ER, pt agreed and will go to [facility] ER, advised if pt sxs worsen closer to [facility] to please come here, pt wife understood and will call office back with any further assistance if needed

## 2013-02-05 NOTE — ED Notes (Signed)
Pt d/c home in NAD. Pt offered wheelchair to d/c, refused. Pt ambulated with steady gait. NAD noted. Pt verbalized understanding of d/c instructions and importance of follow-up.

## 2013-02-05 NOTE — ED Notes (Signed)
Patient had a cardiac cath done on Feb 02, 2013 at the heart center here at [facility].  The patient said he started working out today and noticed he has some bruising to his right groin.  The patient said he had some bruising to the site, but now it has spread to the right leg and some of his upper thigh.  The patient denies pain.  The bruising appears to be in different stages of healing.

## 2013-02-06 NOTE — ED Provider Notes (Signed)
Medical screening examination/treatment/procedure(s) were performed by non-physician practitioner and as supervising physician I was immediately available for consultation/collaboration.   Benny Lennert, MD 02/06/13 1247

## 2013-02-16 ENCOUNTER — Ambulatory Visit (INDEPENDENT_AMBULATORY_CARE_PROVIDER_SITE_OTHER): Payer: 59 | Admitting: Urology

## 2013-02-16 ENCOUNTER — Encounter: Payer: Self-pay | Admitting: Adult Health

## 2013-02-16 ENCOUNTER — Ambulatory Visit (INDEPENDENT_AMBULATORY_CARE_PROVIDER_SITE_OTHER): Payer: 59 | Admitting: Adult Health

## 2013-02-16 VITALS — BP 140/76 | HR 78 | Ht 70.0 in | Wt 243.0 lb

## 2013-02-16 DIAGNOSIS — I10 Essential (primary) hypertension: Secondary | ICD-10-CM

## 2013-02-16 DIAGNOSIS — N529 Male erectile dysfunction, unspecified: Secondary | ICD-10-CM

## 2013-02-16 DIAGNOSIS — R079 Chest pain, unspecified: Secondary | ICD-10-CM

## 2013-02-16 DIAGNOSIS — R361 Hematospermia: Secondary | ICD-10-CM

## 2013-02-16 MED ORDER — ATORVASTATIN CALCIUM 40 MG PO TABS: 40.0000 mg | ORAL_TABLET | Freq: Every day | ORAL | Status: DC

## 2013-02-16 MED ORDER — METOPROLOL SUCCINATE ER 50 MG PO TB24: 50.0000 mg | ORAL_TABLET | Freq: Every day | ORAL | Status: DC

## 2013-02-16 MED ORDER — RANOLAZINE ER 500 MG PO TB12: 500.0000 mg | ORAL_TABLET | Freq: Two times a day (BID) | ORAL | Status: DC

## 2013-02-16 NOTE — Assessment & Plan Note (Signed)
Slightly elevated on this visit. Will not change any medications unless I see that BP remains elevated on future visits. He is also followed by Dr. Juleen China as PCP. If necessary, will add low dose ACE inhibitor or diuretic.

## 2013-02-16 NOTE — Assessment & Plan Note (Signed)
I have spent over 25 minutes with this patient to discuss his concerns, explain our treatment regimen, and discuss his symptoms. He is given a copy of his EKG, and cath results. He is on MY CHART.  I will d/c the imdur as he refuses to take it. I will start him on Ranexa 500 mg BID. I have explained to him that with multi-vessel disease, he will have angina, but this medication may help with decreasing intensity.We can titrate up to 1000 mg BID if necessary. He is willing to try it and give me feed back after a few days.  He will begin taking metoprolol at HS to avoid daytime fatigue. He will take lipitor at HS as well. NTG is refilled and he is advised to have it refilled every 3-6 months.  He verbalizes understanding of all of these instructions and explanation of his procedure and medications.

## 2013-02-16 NOTE — Patient Instructions (Addendum)
Please take Lipitor at bedtime Take your Metoprolol at bedtime Start Ranexa 500 mg one twice a day. Continue all other medications as listed  Follow up in 3 months with Joni Reining, NP

## 2013-02-16 NOTE — Progress Notes (Signed)
HPI Jesse Coleman is a 57 year old male patient of Dr. Dietrich Pates we are following for ongoing assessment and management of chronic angina, and hypertension. He was last in the office by Dr. Dietrich Pates on 01/28/2013. The patient's symptoms were suggestive of classic angina and high likelihood for coronary artery disease. He was plan for cardiac catheterization.   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 The RCA was small and nondominant. The branches with high-grade disease were too small for PCI. Mild to moderate LV dysfunction was noted. His angina noted to be from high-grade disease in very small-caliber branch vessels. Medical management was recommended .   He has refused isosorbide as he wishes to be able to take Levetra. He is requesting another antianginal instead.He also comes with a lot of questions concerning the cardiac cath results, and is angry that he was not told about the bruising he was experiencing post procedure. He is mistrustful of doctors he says. He continues to have exertional angina. He works out daily in an effort to lose wt. Heavy exertion such as stair climbing is the main culprit. Walking or elliptical is better.   Allergies  Allergen Reactions  . Latex     Current Outpatient Prescriptions  Medication Sig Dispense Refill  . aspirin EC 81 MG tablet Take 81 mg by mouth daily.      Marland Kitchen atorvastatin (LIPITOR) 40 MG tablet Take 40 mg by mouth daily.       . bromocriptine (PARLODEL) 2.5 MG tablet Take 2.5 mg by mouth daily.       Marland Kitchen LORazepam (ATIVAN) 1 MG tablet Take 1 mg by mouth every 6 (six) hours as needed.       . naproxen (NAPROSYN) 500 MG tablet Take 500 mg by mouth 2 (two) times daily with a meal.      . NITROSTAT 0.4 MG SL tablet Place 0.4 mg under the tongue every 5 (five) minutes as needed.       . metoprolol succinate (TOPROL-XL) 50 MG 24 hr tablet        No current  facility-administered medications for this visit.    Past Medical History  Diagnosis Date  . Chest pain     12/2012: Nl CXR  . Hypertension 2014    Lipid profile in 12/2012:190, 104, 65, 104; recent blood pressures marginally elevated  . Prolactin-secreting pituitary adenoma 1999    Medical therapy-1999  . Erectile dysfunction   . Overweight   . Hematospermia   . H/O knee surgery 1989    Past Surgical History  Procedure Laterality Date  . Hernia repair  [address] [place]  . Colonoscopy  [address] [place]  . Dental implant  2007  . Refractive surgery Right 2003  . Vasectomy  2003    ROS:.NROs  PHYSICAL EXAM BP 140/76  Pulse 78  Ht 5\' 10"  (1.778 m)  Wt 243 lb (110.224 kg)  BMI 34.87 kg/m2  General: Well developed, well nourished, in no acute distress Head: Eyes PERRLA, No xanthomas.   Normal cephalic and atramatic  Lungs: Clear bilaterally to auscultation and percussion. Heart: HRRR S1 S2, without MRG.  Pulses are 2+ & equal.            No carotid bruit. No JVD.  No abdominal bruits. No femoral bruits. Abdomen: Bowel sounds are positive, abdomen soft and non-tender without masses  or                  Hernia's noted.Obese. Msk:  Back normal, normal gait. Normal strength and tone for age. Extremities: No clubbing, cyanosis or edema.Old ecchymosis is noted at the cath insertion site, and the right hip.  DP +1 Neuro: Alert and oriented X 3. Psych:  Good affect, responds appropriately  EKG: NSR, LBBB. Rate of 78 bpm. ASSESSMENT AND PLAN

## 2013-05-21 ENCOUNTER — Ambulatory Visit: Payer: 59 | Admitting: Adult Health

## 2013-05-28 ENCOUNTER — Encounter: Payer: Self-pay | Admitting: Adult Health

## 2013-05-28 ENCOUNTER — Ambulatory Visit (INDEPENDENT_AMBULATORY_CARE_PROVIDER_SITE_OTHER): Payer: 59 | Admitting: Adult Health

## 2013-05-28 ENCOUNTER — Ambulatory Visit: Payer: 59 | Admitting: Adult Health

## 2013-05-28 VITALS — BP 144/82 | HR 67 | Ht 70.0 in | Wt 241.0 lb

## 2013-05-28 DIAGNOSIS — I10 Essential (primary) hypertension: Secondary | ICD-10-CM

## 2013-05-28 DIAGNOSIS — R079 Chest pain, unspecified: Secondary | ICD-10-CM

## 2013-05-28 MED ORDER — METOPROLOL SUCCINATE ER 25 MG PO TB24: 25.0000 mg | ORAL_TABLET | Freq: Every day | ORAL | Status: DC

## 2013-05-28 NOTE — Patient Instructions (Addendum)
Your physician recommends that you schedule a follow-up appointment in: 1 month   Your physician has recommended you make the following change in your medication:  1. Decreased metoprolol to 25 mg daily at bedtime.

## 2013-05-28 NOTE — Progress Notes (Deleted)
Name: Jesse Coleman    DOB: 10-29-1955  Age: 57 y.o.  MR#: [id number]       PCP:  Michiel Sites, MD      Insurance: Payor: Cleatrice Burke / Plan: Advertising copywriter / Product Type: *No Product type* /   CC:    Chief Complaint  Patient presents with  . Chest Pain  . Hypertension    VS Filed Vitals:   05/28/13 1320  BP: 144/82  Pulse: 67  Height: 5\' 10"  (1.778 m)  Weight: 241 lb (109.317 kg)    Weights Current Weight  05/28/13 241 lb (109.317 kg)  02/16/13 243 lb (110.224 kg)  02/02/13 242 lb (109.77 kg)    Blood Pressure  BP Readings from Last 3 Encounters:  05/28/13 144/82  02/16/13 140/76  02/05/13 137/85     Admit date:  (Not on file) Last encounter with RMR:  02/16/2013   Allergy Latex  Current Outpatient Prescriptions  Medication Sig Dispense Refill  . aspirin EC 81 MG tablet Take 81 mg by mouth daily.      Marland Kitchen atorvastatin (LIPITOR) 40 MG tablet Take 1 tablet (40 mg total) by mouth at bedtime.      . bromocriptine (PARLODEL) 2.5 MG tablet Take 2.5 mg by mouth daily.       Marland Kitchen LORazepam (ATIVAN) 1 MG tablet Take 1 mg by mouth every 6 (six) hours as needed.       . metoprolol succinate (TOPROL-XL) 50 MG 24 hr tablet Take 1 tablet (50 mg total) by mouth at bedtime.      Marland Kitchen NITROSTAT 0.4 MG SL tablet Place 0.4 mg under the tongue every 5 (five) minutes as needed.       . ranolazine (RANEXA) 500 MG 12 hr tablet Take 1 tablet (500 mg total) by mouth 2 (two) times daily.  60 tablet  6   No current facility-administered medications for this visit.    Discontinued Meds:    Medications Discontinued During This Encounter  Medication Reason  . naproxen (NAPROSYN) 500 MG tablet Error    Patient Active Problem List   Diagnosis Date Noted  . Chest pain   . Hypertension   . Prolactin-secreting pituitary adenoma     LABS    Component Value Date/Time   NA 139 01/29/2013 1204   K 4.2 01/29/2013 1204   CL 106 01/29/2013 1204   CO2 26 01/29/2013 1204   GLUCOSE 87  01/29/2013 1204   BUN 13 01/29/2013 1204   CREATININE 1.19 01/29/2013 1204   CALCIUM 9.4 01/29/2013 1204   CMP     Component Value Date/Time   NA 139 01/29/2013 1204   K 4.2 01/29/2013 1204   CL 106 01/29/2013 1204   CO2 26 01/29/2013 1204   GLUCOSE 87 01/29/2013 1204   BUN 13 01/29/2013 1204   CREATININE 1.19 01/29/2013 1204   CALCIUM 9.4 01/29/2013 1204       Component Value Date/Time   WBC 5.7 01/29/2013 1204   HGB 15.1 01/29/2013 1204   HCT 43.0 01/29/2013 1204   MCV 86.9 01/29/2013 1204    Lipid Panel  No results found for this basename: chol, trig, hdl, cholhdl, vldl, ldlcalc    ABG No results found for this basename: phart, pco2, pco2art, po2, po2art, hco3, tco2, acidbasedef, o2sat     No results found for this basename: TSH   BNP (last 3 results) No results found for this basename: PROBNP,  in the last 8760 hours Cardiac Panel (last  3 results) No results found for this basename: CKTOTAL, CKMB, TROPONINI, RELINDX,  in the last 72 hours  Iron/TIBC/Ferritin No results found for this basename: iron, tibc, ferritin     EKG Orders placed in visit on 02/16/13  . EKG 12-LEAD     Prior Assessment and Plan Problem List as of 05/28/2013     Cardiovascular and Mediastinum   Hypertension   Last Assessment & Plan   02/16/2013 Office Visit Written 02/16/2013  3:40 PM by Jodelle Gross, NP     Slightly elevated on this visit. Will not change any medications unless I see that BP remains elevated on future visits. He is also followed by Dr. Juleen China as PCP. If necessary, will add low dose ACE inhibitor or diuretic.      Endocrine   Prolactin-secreting pituitary adenoma     Other   Chest pain   Last Assessment & Plan   02/16/2013 Office Visit Written 02/16/2013  3:39 PM by Jodelle Gross, NP     I have spent over 25 minutes with this patient to discuss his concerns, explain our treatment regimen, and discuss his symptoms. He is given a copy of his EKG, and cath results. He is on MY CHART.  I  will d/c the imdur as he refuses to take it. I will start him on Ranexa 500 mg BID. I have explained to him that with multi-vessel disease, he will have angina, but this medication may help with decreasing intensity.We can titrate up to 1000 mg BID if necessary. He is willing to try it and give me feed back after a few days.  He will begin taking metoprolol at HS to avoid daytime fatigue. He will take lipitor at HS as well. NTG is refilled and he is advised to have it refilled every 3-6 months.  He verbalizes understanding of all of these instructions and explanation of his procedure and medications.        Imaging: No results found.

## 2013-05-28 NOTE — Progress Notes (Signed)
HPI: [name] is a 57 y/o patient of Dr.Mcalany we are seeing for ongoing assessment and management of CAD, s/p cardiac cath in May of 2014 demonstrating triple vessel disease and ongoing angina.   He is treated medically due to small caliber vessels and is not amenable to PCI. He refused isosorbide as he has ED and is taking Levitra. We started him on Ranexa 500 mg BID on last visit.    He comes today without recurrent angina.  He is able to exercise, walk, and use elliptical trainer without chest pain. His main complaint today is fatigue and depression. He changed his medications to pm dosing which helped some but he states he has no libido and is easily angry and agitated. He wonders if some of the medications are causing his symptoms. Otherwise he is without complaint.   Allergies  Allergen Reactions  . Latex     Current Outpatient Prescriptions  Medication Sig Dispense Refill  . aspirin EC 81 MG tablet Take 81 mg by mouth daily.      Marland Kitchen atorvastatin (LIPITOR) 40 MG tablet Take 1 tablet (40 mg total) by mouth at bedtime.      . bromocriptine (PARLODEL) 2.5 MG tablet Take 2.5 mg by mouth daily.       Marland Kitchen LORazepam (ATIVAN) 1 MG tablet Take 1 mg by mouth every 6 (six) hours as needed.       . metoprolol succinate (TOPROL-XL) 25 MG 24 hr tablet Take 1 tablet (25 mg total) by mouth at bedtime.  30 tablet  6  . NITROSTAT 0.4 MG SL tablet Place 0.4 mg under the tongue every 5 (five) minutes as needed.       . ranolazine (RANEXA) 500 MG 12 hr tablet Take 1 tablet (500 mg total) by mouth 2 (two) times daily.  60 tablet  6   No current facility-administered medications for this visit.    Past Medical History  Diagnosis Date  . Chest pain     12/2012: Nl CXR  . Hypertension 2014    Lipid profile in 12/2012:190, 104, 65, 104; recent blood pressures marginally elevated  . Prolactin-secreting pituitary adenoma 1999    Medical therapy-1999  . Erectile dysfunction   . Overweight(278.02)   .  Hematospermia   . H/O knee surgery 1989    Past Surgical History  Procedure Laterality Date  . Hernia repair  [address] [place]  . Colonoscopy  [address] [place]  . Dental implant  2007  . Refractive surgery Right 2003  . Vasectomy  2003    UEA:VWUJWJ of systems complete and found to be negative unless listed above  PHYSICAL EXAM BP 144/82  Pulse 67  Ht 5\' 10"  (1.778 m)  Wt 241 lb (109.317 kg)  BMI 34.58 kg/m2 General: Well developed, well nourished, in no acute distress Head: Eyes PERRLA, No xanthomas.   Normal cephalic and atramatic  Lungs: Clear bilaterally to auscultation and percussion. Heart: HRRR S1 S2, without MRG.  Pulses are 2+ & equal.            No carotid bruit. No JVD.  No abdominal bruits. No femoral bruits. Abdomen: Bowel sounds are positive, abdomen soft and non-tender without masses or                  Hernia's noted. Msk:  Back normal, normal gait. Normal strength and tone for age. Extremities: No clubbing, cyanosis or edema.  DP +1 Neuro: Alert and oriented X 3. Psych:  Good affect, responds appropriately  ASSESSMENT AND PLAN

## 2013-05-28 NOTE — Assessment & Plan Note (Signed)
He is without further complaint of chest pain. However, he is feeling fatigue and depression with metoprolol, with some changes in his libido. I will decrease metoprolol to 25 mg daily with the hopes of helping him feel better with these BB side affects. If angina returns, can increase his ranexa. Will check BMET first for kidney fx before doing so.   Concerning depression and anger. He may need to discuss with PCP antidepressant or counseling at their descretion.

## 2013-05-28 NOTE — Assessment & Plan Note (Signed)
Blood pressure is well controlled currently. No changes other than decrease in BB.

## 2013-06-01 ENCOUNTER — Ambulatory Visit: Payer: 59 | Admitting: Adult Health

## 2013-06-18 ENCOUNTER — Telehealth: Payer: Self-pay | Admitting: Adult Health

## 2013-06-18 NOTE — Telephone Encounter (Signed)
Patient states that he was to call in a couple weeks to speak with Samara Deist. / tgs

## 2013-06-18 NOTE — Telephone Encounter (Signed)
Called patient back but no answer. Left message on answering machine.

## 2013-06-29 ENCOUNTER — Institutional Professional Consult (permissible substitution): Payer: 59 | Admitting: Internal Medicine

## 2013-07-02 ENCOUNTER — Ambulatory Visit: Payer: 59 | Admitting: Adult Health

## 2013-07-07 ENCOUNTER — Encounter: Payer: Self-pay | Admitting: Internal Medicine

## 2013-07-07 ENCOUNTER — Ambulatory Visit (INDEPENDENT_AMBULATORY_CARE_PROVIDER_SITE_OTHER): Payer: 59 | Admitting: Internal Medicine

## 2013-07-07 VITALS — BP 140/80 | HR 66 | Temp 98.2°F | Ht 70.0 in | Wt 237.0 lb

## 2013-07-07 DIAGNOSIS — R079 Chest pain, unspecified: Secondary | ICD-10-CM

## 2013-07-07 MED ORDER — PANTOPRAZOLE SODIUM 40 MG PO TBEC: 40.0000 mg | DELAYED_RELEASE_TABLET | Freq: Every day | ORAL | Status: DC

## 2013-07-07 NOTE — Patient Instructions (Addendum)
Pantoprazole (protonix) 40 mg   Take 30-60 min before first meal of the day plus take pepcid 20 mg at bedtime if coughing   GERD (REFLUX)  is an extremely common cause of respiratory symptoms, many times with no significant heartburn at all.    It can be treated with medication, but also with lifestyle changes including avoidance of late meals, excessive alcohol, smoking cessation, and avoid fatty foods, chocolate, peppermint, colas, red wine, and acidic juices such as orange juice.  NO MINT OR MENTHOL PRODUCTS SO NO COUGH DROPS  USE SUGARLESS CANDY INSTEAD (jolley ranchers or Stover's)  NO OIL BASED VITAMINS - use powdered substitutes.   Weight control is simply a matter of calorie balance which needs to be tilted in your favor by eating less and exercising more.  To get the most out of exercise, you need to be continuously aware that you are short of breath, but never out of breath, for 30 minutes daily. As you improve, it will actually be easier for you to do the same amount of exercise  in  30 minutes so always push to the level where you are short of breath.  If this does not result in gradual weight reduction then I strongly recommend you see a nutritionist with a food diary x 2 weeks so that we can work out a negative calorie balance which is universally effective in steady weight loss programs.  Think of your calorie balance like you do your bank account where in this case you want the balance to go down so you must take in less calories than you burn up.  It's just that simple:  Hard to do, but easy to understand.  Good luck!

## 2013-07-07 NOTE — Progress Notes (Signed)
Subjective:     Patient ID: Jesse Coleman, male   DOB: 1956-01-25, 57 y.o.   MRN: [id number]  HPI  75 yowm never smoker with tendency ever since he remember of head colds into chest every other year with recurrent episode in Oct 2013 but persistent chest burning with exertion never at rest with w/u consistent with stable AP but can't take ntg due to viagra and referred 07/07/13  by Dr Juleen China for ? Asthma component    07/07/2013 1st [facility] office visit/ [facility] one year hx of  recurrent cp if does more than walking a mile     Not limted by breathing.  No active sinus complaints at present nor cough on no inhalers or pulmonary meds.   His "asthma" symptoms and CP have resolved on lopressor though note he has dramatically curtailed his ex x last 6 months.   No obvious  Pattern in mild day to day or daytime variabilty or assoc   subjective wheeze overt sinus or hb symptoms. No unusual exp hx or h/o childhood pna/ asthma or knowledge of premature birth.  Sleeping ok without nocturnal  or early am exacerbation  of respiratory  c/o's or need for noct saba. Also denies any obvious fluctuation of symptoms with weather or environmental changes or other aggravating or alleviating factors except as outlined above   Current Medications, Allergies, Complete Past Medical History, Past Surgical History, Family History, and Social History were reviewed in [employer] record.  ROS  The following are not active complaints unless bolded sore throat, dysphagia, dental problems, itching, sneezing,  nasal congestion or excess/ purulent secretions, ear ache,   fever, chills, sweats, unintended wt loss, pleuritic or exertional cp, hemoptysis,  orthopnea pnd or leg swelling, presyncope, palpitations, heartburn, abdominal pain, anorexia, nausea, vomiting, diarrhea  or change in bowel or urinary habits, change in stools or urine, dysuria,hematuria,  rash, arthralgias, visual complaints, headache,  numbness weakness or ataxia or problems with walking or coordination,  change in mood/affect or memory.       Review of Systems     Objective:   Physical Exam  Wt Readings from Last 3 Encounters:  07/07/13 237 lb (107.502 kg)  05/28/13 241 lb (109.317 kg)  02/16/13 243 lb (110.224 kg)        HEENT: nl dentition, turbinates, and orophanx. Nl external ear canals without cough reflex   NECK :  without JVD/Nodes/TM/ nl carotid upstrokes bilaterally   LUNGS: no acc muscle use, clear to A and P bilaterally without cough on insp or exp maneuvers   CV:  RRR  no s3 or murmur or increase in P2, no edema   ABD:  soft and nontender with nl excursion in the supine position. No bruits or organomegaly, bowel sounds nl  MS:  warm without deformities, calf tenderness, cyanosis or clubbing  SKIN: warm and dry without lesions    NEURO:  alert, approp, no deficits   cxr 01/29/13  No change in low lung volumes. No acute findings.   Assessment:

## 2013-07-09 ENCOUNTER — Ambulatory Visit (INDEPENDENT_AMBULATORY_CARE_PROVIDER_SITE_OTHER): Payer: 59 | Admitting: Adult Health

## 2013-07-09 ENCOUNTER — Encounter: Payer: Self-pay | Admitting: Adult Health

## 2013-07-09 VITALS — BP 167/90 | HR 63 | Ht 70.0 in | Wt 238.0 lb

## 2013-07-09 DIAGNOSIS — R079 Chest pain, unspecified: Secondary | ICD-10-CM

## 2013-07-09 DIAGNOSIS — I10 Essential (primary) hypertension: Secondary | ICD-10-CM

## 2013-07-09 MED ORDER — RANOLAZINE ER 500 MG PO TB12: 500.0000 mg | ORAL_TABLET | Freq: Two times a day (BID) | ORAL | Status: DC

## 2013-07-09 NOTE — Assessment & Plan Note (Signed)
BP is elevated on this visit. Recheck at 138/88. Will continue medication regimen. He will be seen in one year.

## 2013-07-09 NOTE — Progress Notes (Signed)
HPI: [name] is a 57 year old patient of Dr. Clifton James we are following for ongoing assessment and management of CAD  and hypertension. The patient is status post cardiac catheterization in May of 2014 demonstrating triple vessel disease and ongoing angina. He is being treated medically due to small caliber vessels which were not amenable to PCI. The patient was last in the office in August of 2014, he was able exercise walking elliptical trainer without chest pain. His main complaint on last visit was seen depression. His metoprolol was changed to by mouth dosing but which helped but did not assist with complete resolution of fatigue. He was also having issues with changes in libido and is easily angry and agitated. As the Toprol at that time was decreased to 25 mg daily in the evening with hopes of making him feel better with a walker side effects. The patient returned the plan was to increase his Ranexa. He was also recommended to speak with PCP concerning ongoing depression and need for antidepressant vs. counseling at their discretion.   He is without complaints today. Feels better with lower dose of metoprolol. BP is lower than triage recording.  Allergies  Allergen Reactions  . Latex     Current Outpatient Prescriptions  Medication Sig Dispense Refill  . aspirin EC 81 MG tablet Take 81 mg by mouth daily.      Marland Kitchen atorvastatin (LIPITOR) 40 MG tablet Take 1 tablet (40 mg total) by mouth at bedtime.      . bromocriptine (PARLODEL) 2.5 MG tablet Take 1.25 mg by mouth daily.       Marland Kitchen LORazepam (ATIVAN) 1 MG tablet Take 1 mg by mouth every 6 (six) hours as needed.       . metoprolol succinate (TOPROL-XL) 25 MG 24 hr tablet Take 1 tablet (25 mg total) by mouth at bedtime.  30 tablet  6  . NITROSTAT 0.4 MG SL tablet Place 0.4 mg under the tongue every 5 (five) minutes as needed.       . pantoprazole (PROTONIX) 40 MG tablet Take 1 tablet (40 mg total) by mouth daily. Take 30-60 min before first  meal of the day  30 tablet  2  . ranolazine (RANEXA) 500 MG 12 hr tablet Take 1 tablet (500 mg total) by mouth 2 (two) times daily.  60 tablet  6  . vardenafil (LEVITRA) 10 MG tablet Take 10 mg by mouth daily as needed for erectile dysfunction.       No current facility-administered medications for this visit.    Past Medical History  Diagnosis Date  . Chest pain     12/2012: Nl CXR  . Hypertension 2014    Lipid profile in 12/2012:190, 104, 65, 104; recent blood pressures marginally elevated  . Prolactin-secreting pituitary adenoma 1999    Medical therapy-1999  . Erectile dysfunction   . Overweight(278.02)   . Hematospermia   . H/O knee surgery 1989    Past Surgical History  Procedure Laterality Date  . Hernia repair  [address], [place]  . Colonoscopy  [address], [place]  . Dental implant  2007  . Refractive surgery Right 2003  . Vasectomy  2003    ROS: Review of systems complete and found to be negative unless listed above  PHYSICAL EXAM BP 167/90  Pulse 63  Ht 5\' 10"  (1.778 m)  Wt 238 lb (107.956 kg)  BMI 34.15 kg/m2 General: Well developed, well  nourished, in no acute distress Head: Eyes PERRLA, No xanthomas.   Normal cephalic and atramatic  Lungs: Clear bilaterally to auscultation and percussion. Heart: HRRR S1 S2, without MRG.  Pulses are 2+ & equal.            No carotid bruit. No JVD.  No abdominal bruits. No femoral bruits. Abdomen: Bowel sounds are positive, abdomen soft and non-tender without masses or                  Hernia's noted. Msk:  Back normal, normal gait. Normal strength and tone for age. Extremities: No clubbing, cyanosis or edema.  DP +1 Neuro: Alert and oriented X 3. Psych:  Good affect, responds appropriately    ASSESSMENT AND PLAN

## 2013-07-09 NOTE — Progress Notes (Deleted)
Name: Jesse Coleman    DOB: June 25, 1956  Age: 57 y.o.  MR#: [id number]       PCP:  Michiel Sites, MD      Insurance: Payor: Cleatrice Burke / Plan: Advertising copywriter / Product Type: *No Product type* /   CC:    Chief Complaint  Patient presents with  . Chest Pain  . Hypertension    VS Filed Vitals:   07/09/13 1425  BP: 167/90  Pulse: 63  Height: 5\' 10"  (1.778 m)  Weight: 238 lb (107.956 kg)    Weights Current Weight  07/09/13 238 lb (107.956 kg)  07/07/13 237 lb (107.502 kg)  05/28/13 241 lb (109.317 kg)    Blood Pressure  BP Readings from Last 3 Encounters:  07/09/13 167/90  07/07/13 140/80  05/28/13 144/82     Admit date:  (Not on file) Last encounter with RMR:  06/18/2013   Allergy Latex  Current Outpatient Prescriptions  Medication Sig Dispense Refill  . aspirin EC 81 MG tablet Take 81 mg by mouth daily.      Marland Kitchen atorvastatin (LIPITOR) 40 MG tablet Take 1 tablet (40 mg total) by mouth at bedtime.      . bromocriptine (PARLODEL) 2.5 MG tablet Take 1.25 mg by mouth daily.       Marland Kitchen LORazepam (ATIVAN) 1 MG tablet Take 1 mg by mouth every 6 (six) hours as needed.       . metoprolol succinate (TOPROL-XL) 25 MG 24 hr tablet Take 1 tablet (25 mg total) by mouth at bedtime.  30 tablet  6  . NITROSTAT 0.4 MG SL tablet Place 0.4 mg under the tongue every 5 (five) minutes as needed.       . pantoprazole (PROTONIX) 40 MG tablet Take 1 tablet (40 mg total) by mouth daily. Take 30-60 min before first meal of the day  30 tablet  2  . ranolazine (RANEXA) 500 MG 12 hr tablet Take 1 tablet (500 mg total) by mouth 2 (two) times daily.  60 tablet  6  . vardenafil (LEVITRA) 10 MG tablet Take 10 mg by mouth daily as needed for erectile dysfunction.       No current facility-administered medications for this visit.    Discontinued Meds:   There are no discontinued medications.  Patient Active Problem List   Diagnosis Date Noted  . Chest pain   . Hypertension   .  Prolactin-secreting pituitary adenoma     LABS    Component Value Date/Time   NA 139 01/29/2013 1204   K 4.2 01/29/2013 1204   CL 106 01/29/2013 1204   CO2 26 01/29/2013 1204   GLUCOSE 87 01/29/2013 1204   BUN 13 01/29/2013 1204   CREATININE 1.19 01/29/2013 1204   CALCIUM 9.4 01/29/2013 1204   CMP     Component Value Date/Time   NA 139 01/29/2013 1204   K 4.2 01/29/2013 1204   CL 106 01/29/2013 1204   CO2 26 01/29/2013 1204   GLUCOSE 87 01/29/2013 1204   BUN 13 01/29/2013 1204   CREATININE 1.19 01/29/2013 1204   CALCIUM 9.4 01/29/2013 1204       Component Value Date/Time   WBC 5.7 01/29/2013 1204   HGB 15.1 01/29/2013 1204   HCT 43.0 01/29/2013 1204   MCV 86.9 01/29/2013 1204    Lipid Panel  No results found for this basename: chol, trig, hdl, cholhdl, vldl, ldlcalc    ABG No results found for this basename: phart, pco2, pco2art,  po2, po2art, hco3, tco2, acidbasedef, o2sat     No results found for this basename: TSH   BNP (last 3 results) No results found for this basename: PROBNP,  in the last 8760 hours Cardiac Panel (last 3 results) No results found for this basename: CKTOTAL, CKMB, TROPONINI, RELINDX,  in the last 72 hours  Iron/TIBC/Ferritin No results found for this basename: iron, tibc, ferritin     EKG Orders placed in visit on 02/16/13  . EKG 12-LEAD     Prior Assessment and Plan Problem List as of 07/09/2013     Cardiovascular and Mediastinum   Hypertension   Last Assessment & Plan   05/28/2013 Office Visit Written 05/28/2013  2:23 PM by Jodelle Gross, NP     Blood pressure is well controlled currently. No changes other than decrease in BB.       Endocrine   Prolactin-secreting pituitary adenoma     Other   Chest pain   Last Assessment & Plan   05/28/2013 Office Visit Written 05/28/2013  2:22 PM by Jodelle Gross, NP     He is without further complaint of chest pain. However, he is feeling fatigue and depression with metoprolol, with some changes in his libido. I  will decrease metoprolol to 25 mg daily with the hopes of helping him feel better with these BB side affects. If angina returns, can increase his ranexa. Will check BMET first for kidney fx before doing so.   Concerning depression and anger. He may need to discuss with PCP antidepressant or counseling at their descretion.        Imaging: No results found.

## 2013-07-09 NOTE — Patient Instructions (Signed)
Your physician recommends that you schedule a follow-up appointment in: 1 year You will receive a reminder letter two months in advance reminding you to call and schedule your appointment. If you don't receive this letter, please contact our office.  Your physician recommends that you continue on your current medications as directed. Please refer to the Current Medication list given to you today.   

## 2013-07-09 NOTE — Assessment & Plan Note (Signed)
He is without complaints of chest pain, and is tolerating the Ranexa without difficulty. Metoprolol is now at 25 mg daily. Will not increase the dose. He is requesting samples of Ranexa. We will see him in one month unless he is symptomatic.

## 2013-07-10 ENCOUNTER — Encounter: Payer: Self-pay | Admitting: Internal Medicine

## 2013-07-10 NOTE — Assessment & Plan Note (Addendum)
12/2012: Nl CXR 01/2013 Cardiac cath Impression:  1. 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 The RCA is small and non-dominant. The branches with high grade disease are too small for PCI.  2. Mild to moderate LV systolic dysfunction  Recommendations: His major epicardial vessels are free of flow limiting disease. His angina is likely from the high grade disease in the very small caliber branch vessels. Will continue medical management. Will add Imdur 30 mg po QHS and Toprol 50 mg po Qdaily. He will need to stop using Levitra while he is on Imdur.    The reason he is not longer having cp is that he is no longer exerting.  One of the possibilities here is that some of his atypical cp and "asthma" like symptoms are actually GERD related as I stronlgy doubt he has "cardiac asthma" nor for that matter late onset primary asthma in a never smoker  and since he's never tried gerd rx it is reasonable to try it and gradually increase back his ex as limited by cp to see what if any difference this makes.   Pulmonary f/u however can be prn   See instructions for specific recommendations which were reviewed directly with the patient who was given a copy with highlighter outlining the key components.

## 2013-07-12 ENCOUNTER — Ambulatory Visit: Payer: 59 | Admitting: Adult Health

## 2013-09-06 ENCOUNTER — Ambulatory Visit: Payer: 59 | Admitting: Adult Health

## 2013-09-27 ENCOUNTER — Other Ambulatory Visit: Payer: Self-pay | Admitting: Endocrinology

## 2013-09-27 DIAGNOSIS — R05 Cough: Secondary | ICD-10-CM

## 2013-09-27 DIAGNOSIS — R059 Cough, unspecified: Secondary | ICD-10-CM

## 2013-09-28 ENCOUNTER — Ambulatory Visit
Admission: RE | Admit: 2013-09-28 | Discharge: 2013-09-28 | Disposition: A | Discharge status: Home / Self Care | Payer: 59 | Source: Ambulatory Visit | Attending: Endocrinology | Admitting: Endocrinology

## 2013-09-28 DIAGNOSIS — R05 Cough: Secondary | ICD-10-CM

## 2013-09-28 DIAGNOSIS — R059 Cough, unspecified: Secondary | ICD-10-CM

## 2013-09-28 MED ORDER — IOHEXOL 300 MG/ML  SOLN
75.0000 mL | Freq: Once | INTRAMUSCULAR | Status: AC | PRN
  Administered 2013-09-28: 75 mL via INTRAVENOUS

## 2013-10-21 ENCOUNTER — Telehealth: Payer: Self-pay | Admitting: *Deleted

## 2013-10-21 NOTE — Telephone Encounter (Signed)
Pt needs some renexa samples

## 2013-10-21 NOTE — Telephone Encounter (Signed)
Called pt and left message that we had no Renexa samples at this time.

## 2013-10-28 ENCOUNTER — Encounter: Payer: Self-pay | Admitting: Adult Health

## 2013-10-28 ENCOUNTER — Ambulatory Visit (INDEPENDENT_AMBULATORY_CARE_PROVIDER_SITE_OTHER): Payer: 59 | Admitting: Adult Health

## 2013-10-28 VITALS — BP 137/84 | HR 68 | Ht 70.0 in | Wt 239.0 lb

## 2013-10-28 DIAGNOSIS — R079 Chest pain, unspecified: Secondary | ICD-10-CM

## 2013-10-28 DIAGNOSIS — I10 Essential (primary) hypertension: Secondary | ICD-10-CM

## 2013-10-28 MED ORDER — ISOSORBIDE MONONITRATE ER 30 MG PO TB24: 30.0000 mg | ORAL_TABLET | Freq: Every day | ORAL | Status: DC

## 2013-10-28 NOTE — Assessment & Plan Note (Addendum)
I had a long cough with this patient concerning use of nitrates and Levitra. I discussed with him that the combination of both of these medications can cause severe hypotension. He is willing to take the risk, if he stops the nitrates a few days prior to sexual encounter. I discussed my concerns about recurrent angina off of nitrates. Also discussed that taking him off a medication that is clearly working for him and not interfering with his ED medication is probably the better option.  He does not wish to continue taking Ranexa due to the cost. I have discussed with him the short acting nitrates, but he does not wish to take them 3 times a day . I have given him a prescription for indoor 30 mg daily, with the warning that he does not need to take Levitra and Imdur  on the same day, or within at least 48 hours of use of either. He verbalizes understanding. I doubt that this change will be acceptable to him long-term. He is willing to try it, but I have warned him about possible side effects and reiterated the need not to take these medications together. He verbalizes understanding.

## 2013-10-28 NOTE — Progress Notes (Deleted)
Name: Jesse Coleman    DOB: January 25, 1956  Age: 58 y.o.  MR#: [id number]       PCP:  Dwan Bolt, MD      Insurance: Payor: Onnie Boer / Plan: Theme park manager / Product Type: *No Product type* /   CC:    Chief Complaint  Patient presents with  . Coronary Artery Disease  . Hypertension  Discuss Meds  VS Filed Vitals:   10/28/13 1534  BP: 137/84  Pulse: 68  Height: 5\' 10"  (1.778 m)  Weight: 239 lb (108.41 kg)    Weights Current Weight  10/28/13 239 lb (108.41 kg)  07/09/13 238 lb (107.956 kg)  07/07/13 237 lb (107.502 kg)    Blood Pressure  BP Readings from Last 3 Encounters:  10/28/13 137/84  07/09/13 167/90  07/07/13 140/80     Admit date:  (Not on file) Last encounter with RMR:  07/09/2013   Allergy Latex  Current Outpatient Prescriptions  Medication Sig Dispense Refill  . aspirin EC 81 MG tablet Take 81 mg by mouth daily.      Marland Kitchen atorvastatin (LIPITOR) 40 MG tablet Take 1 tablet (40 mg total) by mouth at bedtime.      . bromocriptine (PARLODEL) 2.5 MG tablet Take 1.25 mg by mouth daily.       Marland Kitchen LORazepam (ATIVAN) 1 MG tablet Take 1 mg by mouth every 6 (six) hours as needed.       . metoprolol succinate (TOPROL-XL) 50 MG 24 hr tablet Take 50 mg by mouth daily. Take with or immediately following a meal.      . NITROSTAT 0.4 MG SL tablet Place 0.4 mg under the tongue every 5 (five) minutes as needed.       . pantoprazole (PROTONIX) 40 MG tablet Take 1 tablet (40 mg total) by mouth daily. Take 30-60 min before first meal of the day  30 tablet  2  . ranolazine (RANEXA) 500 MG 12 hr tablet Take 1 tablet (500 mg total) by mouth 2 (two) times daily.  60 tablet  6  . vardenafil (LEVITRA) 10 MG tablet Take 10 mg by mouth daily as needed for erectile dysfunction.       No current facility-administered medications for this visit.    Discontinued Meds:    Medications Discontinued During This Encounter  Medication Reason  . metoprolol succinate (TOPROL-XL) 25  MG 24 hr tablet Error    Patient Active Problem List   Diagnosis Date Noted  . Chest pain   . Hypertension   . Prolactin-secreting pituitary adenoma     LABS    Component Value Date/Time   NA 139 01/29/2013 1204   K 4.2 01/29/2013 1204   CL 106 01/29/2013 1204   CO2 26 01/29/2013 1204   GLUCOSE 87 01/29/2013 1204   BUN 13 01/29/2013 1204   CREATININE 1.19 01/29/2013 1204   CALCIUM 9.4 01/29/2013 1204   CMP     Component Value Date/Time   NA 139 01/29/2013 1204   K 4.2 01/29/2013 1204   CL 106 01/29/2013 1204   CO2 26 01/29/2013 1204   GLUCOSE 87 01/29/2013 1204   BUN 13 01/29/2013 1204   CREATININE 1.19 01/29/2013 1204   CALCIUM 9.4 01/29/2013 1204       Component Value Date/Time   WBC 5.7 01/29/2013 1204   HGB 15.1 01/29/2013 1204   HCT 43.0 01/29/2013 1204   MCV 86.9 01/29/2013 1204    Lipid Panel  No results found for  this basename: chol, trig, hdl, cholhdl, vldl, ldlcalc    ABG No results found for this basename: phart, pco2, pco2art, po2, po2art, hco3, tco2, acidbasedef, o2sat     No results found for this basename: TSH   BNP (last 3 results) No results found for this basename: PROBNP,  in the last 8760 hours Cardiac Panel (last 3 results) No results found for this basename: CKTOTAL, CKMB, TROPONINI, RELINDX,  in the last 72 hours  Iron/TIBC/Ferritin No results found for this basename: iron, tibc, ferritin     EKG Orders placed in visit on 02/16/13  . EKG 12-LEAD     Prior Assessment and Plan Problem List as of 10/28/2013     Cardiovascular and Mediastinum   Hypertension   Last Assessment & Plan   07/09/2013 Office Visit Written 07/09/2013  2:49 PM by Lendon Colonel, NP     BP is elevated on this visit. Recheck at 138/88. Will continue medication regimen. He will be seen in one year.       Endocrine   Prolactin-secreting pituitary adenoma     Other   Chest pain   Last Assessment & Plan   07/07/2013 Office Visit Edited 07/10/2013  2:42 PM by Tanda Rockers, MD      12/2012: Nl CXR 01/2013 Cardiac cath Impression:  1. 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 The RCA is small and non-dominant. The branches with high grade disease are too small for PCI.  2. Mild to moderate LV systolic dysfunction  Recommendations: His major epicardial vessels are free of flow limiting disease. His angina is likely from the high grade disease in the very small caliber branch vessels. Will continue medical management. Will add Imdur 30 mg po QHS and Toprol 50 mg po Qdaily. He will need to stop using Levitra while he is on Imdur.    The reason he is not longer having cp is that he is no longer exerting.  One of the possibilities here is that some of his atypical cp and "asthma" like symptoms are actually GERD related as I stronlgy doubt he has "cardiac asthma" nor for that matter late onset primary asthma in a never smoker  and since he's never tried gerd rx it is reasonable to try it and gradually increase back his ex as limited by cp to see what if any difference this makes.   Pulmonary f/u however can be prn   See instructions for specific recommendations which were reviewed directly with the patient who was given a copy with highlighter outlining the key components.         Imaging: No results found.

## 2013-10-28 NOTE — Patient Instructions (Addendum)
Your physician recommends that you schedule a follow-up appointment in: 6 months with Dr Virgina Jock will receive a reminder letter two months in advance reminding you to call and schedule your appointment. If you don't receive this letter, please contact our office.  Your physician has recommended you make the following change in your medication:  1. STOP Renexa 2. Start Imdur 30 mg daily      Take with Tylenol the first week  Do not take with Levitra!

## 2013-10-28 NOTE — Progress Notes (Signed)
HPI: [name] is a 58 year old patient of Dr. Angelena Form is going to be followed in [place] office and be est. with Dr. Pennelope Bracken, we are following for ongoing assessment and management of CAD and hypertension. The patient has a history of triple-vessel disease post cardiac catheterization in May of 2014 with ongoing angina. He is being treated medically in the setting of small caliber vessels which are not amenable to PCI. He is being treated with low-dose beta blocker and Ranexa at the last visit in October of 2014 the patient was without discomfort in his chest tolerating medications well.   He comes today unhappy about the cost of Ranexa. He states it is becoming too expensive for him and he does not wish to take it anymore. In the past the patient has not wanted to be on him to work because he states he was interfering with his use of Levitra. He is more interested now in restarting nitrates, and working around the use of Levitra in order to decrease his cost of medications.    He denies recurrent chest pain dyspnea or shortness of breath. He is very irritated concerning the cost of medications and wants [place] to work with him concerning meds adjustments so that he can continue his ED medication.      Allergies  Allergen Reactions  . Latex     Current Outpatient Prescriptions  Medication Sig Dispense Refill  . aspirin EC 81 MG tablet Take 81 mg by mouth daily.      Marland Kitchen atorvastatin (LIPITOR) 40 MG tablet Take 1 tablet (40 mg total) by mouth at bedtime.      . bromocriptine (PARLODEL) 2.5 MG tablet Take 1.25 mg by mouth daily.       Marland Kitchen LORazepam (ATIVAN) 1 MG tablet Take 1 mg by mouth every 6 (six) hours as needed.       . metoprolol succinate (TOPROL-XL) 50 MG 24 hr tablet Take 50 mg by mouth daily. Take with or immediately following a meal.      . NITROSTAT 0.4 MG SL tablet Place 0.4 mg under the tongue every 5 (five) minutes as needed.       . vardenafil (LEVITRA) 10 MG tablet Take 10 mg  by mouth daily as needed for erectile dysfunction.      . isosorbide mononitrate (IMDUR) 30 MG 24 hr tablet Take 1 tablet (30 mg total) by mouth daily.  30 tablet  6   No current facility-administered medications for this visit.    Past Medical History  Diagnosis Date  . Chest pain     12/2012: Nl CXR  . Hypertension 2014    Lipid profile in 12/2012:190, 104, 65, 104; recent blood pressures marginally elevated  . Prolactin-secreting pituitary adenoma 1999    Medical therapy-1999  . Erectile dysfunction   . Overweight   . Hematospermia   . H/O knee surgery 1989    Past Surgical History  Procedure Laterality Date  . Hernia repair  [address] [place]  . Colonoscopy  [address], [place]  . Dental implant  2007  . Refractive surgery Right 2003  . Vasectomy  2003    ROS: Review of systems complete and found to be negative unless listed above  PHYSICAL EXAM BP 137/84  Pulse 68  Ht 5\' 10"  (1.778 m)  Wt 239 lb (108.41 kg)  BMI 34.29 kg/m2  General: Well developed, well nourished, in no acute distress Head:  Eyes PERRLA, No xanthomas.   Normal cephalic and atramatic  Lungs: Clear bilaterally to auscultation and percussion. Heart: HRRR S1 S2, without MRG.  Pulses are 2+ & equal.            No carotid bruit. No JVD.  No abdominal bruits. No femoral bruits. Abdomen: Bowel sounds are positive, abdomen soft and non-tender without masses or                  Hernia's noted. Msk:  Back normal, normal gait. Normal strength and tone for age. Extremities: No clubbing, cyanosis or edema.  DP +1 Neuro: Alert and oriented X 3. Psych:  Good affect, responds appropriately  :  ASSESSMENT AND PLAN

## 2013-10-28 NOTE — Assessment & Plan Note (Signed)
Currently well controlled. Will not make any other changes.

## 2013-12-06 ENCOUNTER — Encounter: Payer: Self-pay | Admitting: Adult Health

## 2013-12-07 ENCOUNTER — Encounter: Payer: Self-pay | Admitting: *Deleted

## 2013-12-07 MED ORDER — ISOSORBIDE MONONITRATE ER 30 MG PO TB24: 30.0000 mg | ORAL_TABLET | Freq: Every day | ORAL | Status: DC

## 2014-03-30 ENCOUNTER — Encounter: Payer: Self-pay | Admitting: Cardiovascular Disease

## 2014-03-30 ENCOUNTER — Ambulatory Visit (INDEPENDENT_AMBULATORY_CARE_PROVIDER_SITE_OTHER): Payer: 59 | Admitting: Cardiovascular Disease

## 2014-03-30 VITALS — BP 128/77 | HR 67 | Ht 70.0 in | Wt 241.0 lb

## 2014-03-30 DIAGNOSIS — I2510 Atherosclerotic heart disease of native coronary artery without angina pectoris: Secondary | ICD-10-CM

## 2014-03-30 DIAGNOSIS — I10 Essential (primary) hypertension: Secondary | ICD-10-CM

## 2014-03-30 NOTE — Patient Instructions (Signed)
Your physician wants you to follow-up in:  6 months. You will receive a reminder letter in the mail two months in advance. If you don't receive a letter, please call our office to schedule the follow-up appointment.   

## 2014-03-30 NOTE — Progress Notes (Signed)
History of Present Illness: 58 yo male with history of CAD, HTN who is here today for cardiology follow up. Cardiac cath 02/02/13 with severe disease in the small non-dominant RCA, small Diagonal branches and small caliber obtuse marginal branches. He has had chronic stable angina while being treated medically in the setting of small caliber vessels which are not amenable to PCI. He has been followed in the [place] office. He was most recently seen in the [place] office 10/28/13. He has been on Ranexa but stopped due to cost. He tried Imdur but did not tolerate due to headaches. He is now back on Ranexa.   He is here today for follow up. He denies chest pain or shortness of breath. He has been walking one mile every 3-4 days. Tolerating all meds.   Primary Care Physician: Anda Kraft  Lipid profile: Followed in primary care  Past Medical History  Diagnosis Date  . Hypertension 2014  . Prolactin-secreting pituitary adenoma 1999    Medical therapy-1999  . Erectile dysfunction   . Overweight(278.02)   . Hematospermia   . CAD (coronary artery disease)     Past Surgical History  Procedure Laterality Date  . Hernia repair  [address], [place]  . Colonoscopy  [address], [place]  . Dental implant  2007  . Refractive surgery Right 2003  . Vasectomy  2003  . Knee surgery      Current Outpatient Prescriptions  Medication Sig Dispense Refill  . aspirin EC 81 MG tablet Take 81 mg by mouth daily.      Marland Kitchen atorvastatin (LIPITOR) 40 MG tablet Take 1 tablet (40 mg total) by mouth at bedtime.      . bromocriptine (PARLODEL) 2.5 MG tablet Take 1.25 mg by mouth daily.       Marland Kitchen LORazepam (ATIVAN) 1 MG tablet Take 1 mg by mouth every 6 (six) hours as needed.       . metoprolol succinate (TOPROL-XL) 50 MG 24 hr tablet Take 50 mg by mouth daily. Take with or immediately following a meal.      . NITROSTAT 0.4 MG SL tablet Place 0.4 mg under the tongue every 5 (five) minutes  as needed.       . ranolazine (RANEXA) 500 MG 12 hr tablet Take 500 mg by mouth 2 (two) times daily.      . vardenafil (LEVITRA) 10 MG tablet Take 10 mg by mouth daily as needed for erectile dysfunction.       No current facility-administered medications for this visit.    Allergies  Allergen Reactions  . Latex     History   Social History  . Marital Status: Married    Spouse Name: N/A    Number of Children: 2  . Years of Education: N/A   Occupational History  . [employer] MANAGER    Social History Main Topics  . Smoking status: Never Smoker   . Smokeless tobacco: Never Used  . Alcohol Use: 0.5 oz/week    1 drink(s) per week  . Drug Use: No  . Sexual Activity: Not on file   Other Topics Concern  . Not on file   Social History Narrative  . No narrative on file    Family History  Problem Relation Age of Onset  . Coronary artery disease Father   . Emphysema Mother     smoked    Review of Systems:  As  stated in the HPI and otherwise negative.   BP 128/77  Pulse 67  Ht 5\' 10"  (1.778 m)  Wt 241 lb (109.317 kg)  BMI 34.58 kg/m2  Physical Examination: General: Well developed, well nourished, NAD HEENT: OP clear, mucus membranes moist SKIN: warm, dry. No rashes. Neuro: No focal deficits Musculoskeletal: Muscle strength 5/5 all ext Psychiatric: Mood and affect normal Neck: No JVD, no carotid bruits, no thyromegaly, no lymphadenopathy. Lungs:Clear bilaterally, no wheezes, rhonci, crackles Cardiovascular: Regular rate and rhythm. No murmurs, gallops or rubs. Abdomen:Soft. Bowel sounds present. Non-tender.  Extremities: No lower extremity edema. Pulses are 2 + in the bilateral DP/PT.  Cardiac cath 02/02/13: Left main: No obstructive disease.  Left Anterior Descending Artery: Large caliber vessel that courses to the apex. The entire proximal and mid vessel has severe calcification. There is diffuse 40% stenosis throughout the proximal and mid vessel but this  does not appear to be flow limiting. The distal LAD becomes smaller in caliber and has diffuse 30% stenosis. There are 3 very small caliber diagonal branches. The first two diagonal branches are 1.0 mm in diameter and each has 70-80% ostial stenosis (too small for PCI).  Circumflex Artery: Large caliber vessel with 20% proximal stenosis. The AV groove Circumflex leads into a moderate caliber left sided posterolateral branch with mild plaque. The first obtuse marginal branch is very small in caliber (1. 0 mm) and has diffuse 90% proximal stenosis. The second obtuse marginal branch is very small in caliber and has severe distal stenosis. (too small for PCI). The third obtuse marginal branch is small to moderate in caliber and has proximal 50% stenosis, mod 40% stenosis.  Right Coronary Artery: Small caliber, non-dominant vessel with 90% proximal stenosis. This vessel appears to be 1.5-1.75 mm in caliber (too small for PCI)  Left Ventricular Angiogram: LVEF 40-45%, global hypokinesis.   EKG: NSR, rate 67 bpm. Poor R wave progression pre-cordial leads.   Assessment and Plan:   1. CAD: Stable. Continue current meds.   2. HTN: BP controlled. No changes.

## 2016-02-13 ENCOUNTER — Ambulatory Visit (INDEPENDENT_AMBULATORY_CARE_PROVIDER_SITE_OTHER): Payer: BLUE CROSS/BLUE SHIELD | Admitting: Urology

## 2016-02-13 DIAGNOSIS — N5201 Erectile dysfunction due to arterial insufficiency: Secondary | ICD-10-CM | POA: Diagnosis not present

## 2018-03-17 ENCOUNTER — Encounter: Payer: Self-pay | Admitting: Cardiology

## 2018-03-23 ENCOUNTER — Telehealth: Payer: Self-pay | Admitting: Cardiology

## 2018-03-23 NOTE — Telephone Encounter (Signed)
Received records from [place] on 03/23/18, Appt 05/05/18 @ 8:40AM. [place]

## 2018-04-29 NOTE — Progress Notes (Signed)
Referring-Jesse Wilson Singer, MD Reason for referral-coronary artery disease  HPI: 62 year old male for evaluation of coronary artery disease at request of Anda Kraft, MD.  Patient was previously followed at [facility].  Stress echocardiogram 2014 showed left bundle branch block and ejection fraction 40 to 45%. Previous cardiac catheterization in 2014 showed stenosis in a small nondominant right coronary artery, small diagonal and small obtuse marginal none of which were deemed candidates for PCI.  He has been treated medically.  Patient states that when he is taking his medications he does not have exertional chest pain.  He denies dyspnea, orthopnea, PND, pedal edema, palpitations or syncope.  Cardiac cath 02/02/13: Left main: No obstructive disease.  Left Anterior Descending Artery: Large caliber vessel that courses to the apex. The entire proximal and mid vessel has severe calcification. There is diffuse 40% stenosis throughout the proximal and mid vessel but this does not appear to be flow limiting. The distal LAD becomes smaller in caliber and has diffuse 30% stenosis. There are 3 very small caliber diagonal branches. The first two diagonal branches are 1.0 mm in diameter and each has 70-80% ostial stenosis (too small for PCI).  Circumflex Artery: Large caliber vessel with 20% proximal stenosis. The AV groove Circumflex leads into a moderate caliber left sided posterolateral branch with mild plaque. The first obtuse marginal branch is very small in caliber (1. 0 mm) and has diffuse 90% proximal stenosis. The second obtuse marginal branch is very small in caliber and has severe distal stenosis. (too small for PCI). The third obtuse marginal branch is small to moderate in caliber and has proximal 50% stenosis, mod 40% stenosis.  Right Coronary Artery: Small caliber, non-dominant vessel with 90% proximal stenosis. This vessel appears to be 1.5-1.75 mm in caliber (too small for PCI)  Left  Ventricular Angiogram: LVEF 40-45%, global hypokinesis.   Current Outpatient Medications  Medication Sig Dispense Refill  . aspirin EC 81 MG tablet Take 81 mg by mouth daily.    . bromocriptine (PARLODEL) 2.5 MG tablet Take 1.25 mg by mouth daily.     . carisoprodol (SOMA) 350 MG tablet   1  . HYDROcodone-acetaminophen (NORCO/VICODIN) 5-325 MG tablet hydrocodone 5 mg-acetaminophen 325 mg tablet    . isosorbide mononitrate (IMDUR) 60 MG 24 hr tablet Take by mouth.    Marland Kitchen lisinopril (PRINIVIL,ZESTRIL) 5 MG tablet lisinopril 5 mg tablet    . LORazepam (ATIVAN) 1 MG tablet Take 1 mg by mouth every 6 (six) hours as needed.     . meloxicam (MOBIC) 15 MG tablet meloxicam 15 mg tablet    . metoprolol succinate (TOPROL-XL) 50 MG 24 hr tablet Take 50 mg by mouth daily. Take with or immediately following a meal.    . NITROSTAT 0.4 MG SL tablet Place 0.4 mg under the tongue every 5 (five) minutes as needed.     . vardenafil (LEVITRA) 10 MG tablet Take 10 mg by mouth daily as needed for erectile dysfunction.    Marland Kitchen atorvastatin (LIPITOR) 40 MG tablet Take 1 tablet (40 mg total) by mouth at bedtime. (Patient not taking: Reported on 05/05/2018)    . ranolazine (RANEXA) 500 MG 12 hr tablet Take 500 mg by mouth 2 (two) times daily.     No current facility-administered medications for this visit.     Allergies  Allergen Reactions  . Latex      Past Medical History:  Diagnosis Date  . CAD (coronary artery disease)   . Erectile  dysfunction   . Hematospermia   . Hyperlipidemia   . Hypertension 2014  . Overweight(278.02)   . Prolactin-secreting pituitary adenoma [facility]) 1999   Medical therapy-1999    Past Surgical History:  Procedure Laterality Date  . COLONOSCOPY  [address] [place]  . Dental implant  2007  . [place]   [place], [place]  . KNEE SURGERY    . REFRACTIVE SURGERY Right 2003  . VASECTOMY  2003    Social History   Socioeconomic History  . Marital status:  Married    Spouse name: Not on file  . Number of children: 2  . Years of education: Not on file  . Highest education level: Not on file  Occupational History  . Occupation: WHOLE SALE PARTS MANAGER    Employer: [employer]  Social Needs  . Financial resource strain: Not on file  . Food insecurity:    Worry: Not on file    Inability: Not on file  . Transportation needs:    Medical: Not on file    Non-medical: Not on file  Tobacco Use  . Smoking status: Never Smoker  . Smokeless tobacco: Never Used  Substance and Sexual Activity  . Alcohol use: Yes    Alcohol/week: 0.6 oz    Types: 1 drink(s) per week  . Drug use: No  . Sexual activity: Not on file  Lifestyle  . Physical activity:    Days per week: Not on file    Minutes per session: Not on file  . Stress: Not on file  Relationships  . Social connections:    Talks on phone: Not on file    Gets together: Not on file    Attends religious service: Not on file    Active member of club or organization: Not on file    Attends meetings of clubs or organizations: Not on file    Relationship status: Not on file  . Intimate partner violence:    Fear of current or ex partner: Not on file    Emotionally abused: Not on file    Physically abused: Not on file    Forced sexual activity: Not on file  Other Topics Concern  . Not on file  Social History Narrative  . Not on file    Family History  Problem Relation Age of Onset  . Coronary artery disease Father   . Emphysema Mother        smoked    ROS: no fevers or chills, productive cough, hemoptysis, dysphasia, odynophagia, melena, hematochezia, dysuria, hematuria, rash, seizure activity, orthopnea, PND, pedal edema, claudication. Remaining systems are negative.  Physical Exam:   Blood pressure (!) 147/86, pulse (!) 55, height 5\' 10"  (1.778 m), weight 240 lb 3.2 oz (109 kg).  General:  Well developed/well nourished in NAD Skin warm/dry Patient not depressed No peripheral  clubbing Back-normal HEENT-normal/normal eyelids Neck supple/normal carotid upstroke bilaterally; no bruits; no JVD; no thyromegaly chest - CTA/ normal expansion CV - RRR/normal S1 and S2; no murmurs, rubs or gallops;  PMI nondisplaced Abdomen -NT/ND, no HSM, no mass, + bowel sounds, no bruit 2+ femoral pulses, no bruits Ext-no edema, chords, 2+ DP Neuro-grossly nonfocal  ECG -sinus rhythm at a rate of 55.  First-degree AV block.  Cannot rule out prior anterior infarct.  Personally reviewed  A/P  1 coronary artery disease-patient has known coronary disease.  He is on a good antianginal regimen.  He is not having exertional  chest pain.  We will therefore continue medical therapy.  Continue aspirin, beta-blocker and nitrates.  Intolerant to statins.  Would have low threshold for catheterization in the future.  2 hyperlipidemia-patient is intolerant to all statins.  I will obtain his most recent lipids from Dr. Baldwin Crown office.  If LDL greater than 70 I will ask him to be evaluated in lipid clinic for [place].  3 hypertension-but pressure is mildly elevated.  Increase lisinopril to 10 mg daily.  In 1 week check potassium and renal function.  Kirk Ruths, MD

## 2018-05-05 ENCOUNTER — Ambulatory Visit (INDEPENDENT_AMBULATORY_CARE_PROVIDER_SITE_OTHER): Payer: BLUE CROSS/BLUE SHIELD | Admitting: Cardiology

## 2018-05-05 ENCOUNTER — Encounter: Payer: Self-pay | Admitting: Cardiology

## 2018-05-05 VITALS — BP 147/86 | HR 55 | Ht 70.0 in | Wt 240.2 lb

## 2018-05-05 DIAGNOSIS — E7800 Pure hypercholesterolemia, unspecified: Secondary | ICD-10-CM | POA: Diagnosis not present

## 2018-05-05 DIAGNOSIS — I10 Essential (primary) hypertension: Secondary | ICD-10-CM

## 2018-05-05 DIAGNOSIS — I2510 Atherosclerotic heart disease of native coronary artery without angina pectoris: Secondary | ICD-10-CM

## 2018-05-05 MED ORDER — LISINOPRIL 10 MG PO TABS: 10.0000 mg | ORAL_TABLET | Freq: Every day | ORAL | 3 refills | Status: DC

## 2018-05-05 NOTE — Patient Instructions (Signed)
Medication Instructions:   INCREASE LISINOPRIL TO 10 MG ONCE DAILY= 2 OF THE 5 MG TABLETS ONCE DAILY  Labwork:  Your physician recommends that you return for lab work in: [place]:  Your physician wants you to follow-up in: [place] will receive a reminder letter in the mail two months in advance. If you don't receive a letter, please call our office to schedule the follow-up appointment.   If you need a refill on your cardiac medications before your next appointment, please call your pharmacy.

## 2018-06-03 ENCOUNTER — Ambulatory Visit (INDEPENDENT_AMBULATORY_CARE_PROVIDER_SITE_OTHER): Payer: BLUE CROSS/BLUE SHIELD | Admitting: Pharmacist Clinician (PhC)/ Clinical Pharmacy Specialist

## 2018-06-03 DIAGNOSIS — E785 Hyperlipidemia, unspecified: Secondary | ICD-10-CM | POA: Insufficient documentation

## 2018-06-03 MED ORDER — EZETIMIBE 10 MG PO TABS: 10.0000 mg | ORAL_TABLET | Freq: Every day | ORAL | 6 refills | Status: DC

## 2018-06-03 NOTE — Progress Notes (Signed)
06/03/2018 Jesse Coleman 11-08-1955 [id number]   HPI:  Jesse Coleman is a 62 y.o. male patient of Dr Stanford Breed, who presents today for a lipid clinic evaluation.  In addition to hyperlipidemia, his medical history is significant for CAD with multiple small vessel stenoses (70-90%), LBBB and hypertension.    Patient has continued to have exertional chest pain over the past few years, but otherwise is feeling well.  His medication list was reviewed and updated in the office today.    Current Medications:  none  Cholesterol Goals:   LDL < 70  Intolerant/previously tried:  Atorvastatin 40 mg - myalgias  Tried other statin drugs while living in [place], no record here of doses   Family history:   Father died from [place] - at 14  2 sisters, 1 fine, 1 died from kidney/liver failure  Diet:   Mostly home cooked; fried food twice weekly; occasional fish; fresh f/v; plenty of rice and potatoes, occasional pasta and bread; not many snack foods  Exercise:    No regular exercise; stamina diminished with heartburn; still no more than 1 mile d/t chest pain startng  Labs:   04/2018:  TC 190, TC 85, HDL 62, LDL 111  07/2017:  TC 214, TG 63, HDL 80, LDL 121  Current Outpatient Medications  Medication Sig Dispense Refill  . aspirin EC 81 MG tablet Take 81 mg by mouth daily.    . bromocriptine (PARLODEL) 2.5 MG tablet Take 1.25 mg by mouth daily.     . carisoprodol (SOMA) 350 MG tablet   1  . HYDROcodone-acetaminophen (NORCO/VICODIN) 5-325 MG tablet hydrocodone 5 mg-acetaminophen 325 mg tablet    . isosorbide mononitrate (IMDUR) 60 MG 24 hr tablet Take by mouth.    Marland Kitchen lisinopril (PRINIVIL,ZESTRIL) 10 MG tablet Take 1 tablet (10 mg total) by mouth daily. 90 tablet 3  . LORazepam (ATIVAN) 1 MG tablet Take 1 mg by mouth every 6 (six) hours as needed.     . meloxicam (MOBIC) 15 MG tablet meloxicam 15 mg tablet    . metoprolol succinate (TOPROL-XL) 50 MG 24 hr tablet Take 100 mg by mouth daily. Take  with or immediately following a meal.     . NITROSTAT 0.4 MG SL tablet Place 0.4 mg under the tongue every 5 (five) minutes as needed.     . vardenafil (LEVITRA) 10 MG tablet Take 10 mg by mouth daily as needed for erectile dysfunction.    Marland Kitchen ezetimibe (ZETIA) 10 MG tablet Take 1 tablet (10 mg total) by mouth daily. 30 tablet 6   No current facility-administered medications for this visit.     Allergies  Allergen Reactions  . Latex     Past Medical History:  Diagnosis Date  . CAD (coronary artery disease)   . Erectile dysfunction   . Hematospermia   . Hyperlipidemia   . Hypertension 2014  . Overweight(278.02)   . Prolactin-secreting pituitary adenoma [facility]) 1999   Medical therapy-1999    There were no vitals taken for this visit.   Hyperlipidemia LDL goal <70 Patient with hyperlipidemia and CAD, treated medically.  Patient has not done well with statins in the past and has no interest in trying low dose.  Reviewed options for ezetimibe, PCSK-9 inhibitors as well as potential bempedoid acid research options.  Patient not interested in PCSK-9 inhibitors at this time, concerned about expense.  Is interested in research option, however has never had prior CV event, so would not qualify for ORION 4 at  this time.  Will start him on ezetimibe 10 mg once daily and repeat labs in 3 months.  He would also like to work on lifestyle modifications.  In three months will repeat labs and determine next step   Tommy Medal PharmD CPP [place] [address] [place], [place] [address]

## 2018-06-03 NOTE — Assessment & Plan Note (Signed)
Patient with hyperlipidemia and CAD, treated medically.  Patient has not done well with statins in the past and has no interest in trying low dose.  Reviewed options for ezetimibe, PCSK-9 inhibitors as well as potential bempedoid acid research options.  Patient not interested in PCSK-9 inhibitors at this time, concerned about expense.  Is interested in research option, however has never had prior CV event, so would not qualify for ORION 4 at this time.  Will start him on ezetimibe 10 mg once daily and repeat labs in 3 months.  He would also like to work on lifestyle modifications.  In three months will repeat labs and determine next step

## 2018-06-03 NOTE — Patient Instructions (Addendum)
Your most recent lipid panel  Total cholesterol  190   (goal < 200)  Triglycerides         85     (goal < 150)  HDL          66     (goal > 40)  LDL         111    (goal < 70)  Start ezetimibe 10 mg once daily.  Repeat labs in 3 months (about the first of December)    Cholesterol Cholesterol is a fat. Your body needs a small amount of cholesterol. Cholesterol (plaque) may build up in your blood vessels (arteries). That makes you more likely to have a heart attack or stroke. You cannot feel your cholesterol level. Having a blood test is the only way to find out if your level is high. Keep your test results. Work with your doctor to keep your cholesterol at a good level. What do the results mean?  Total cholesterol is how much cholesterol is in your blood.  LDL is bad cholesterol. This is the type that can build up. Try to have low LDL.  HDL is good cholesterol. It cleans your blood vessels and carries LDL away. Try to have high HDL.  Triglycerides are fat that the body can store or burn for energy. What are good levels of cholesterol?  Total cholesterol below 200.  LDL below 100 is good for people who have health risks. LDL below 70 is good for people who have very high risks.  HDL above 40 is good. It is best to have HDL of 60 or higher.  Triglycerides below 150. How can I lower my cholesterol? Diet Follow your diet program as told by your doctor.  Choose fish, white meat chicken, or [place] that is roasted or baked. Try not to eat red meat, fried foods, sausage, or lunch meats.  Eat lots of fresh fruits and vegetables.  Choose whole grains, beans, pasta, potatoes, and cereals.  Choose olive oil, corn oil, or canola oil. Only use small amounts.  Try not to eat butter, mayonnaise, shortening, or palm kernel oils.  Try not to eat foods with trans fats.  Choose low-fat or nonfat dairy foods. ? Drink skim or nonfat milk. ? Eat low-fat or nonfat yogurt and cheeses. ? Try  not to drink whole milk or cream. ? Try not to eat ice cream, egg yolks, or full-fat cheeses.  Healthy desserts include angel food cake, ginger snaps, animal crackers, hard candy, popsicles, and low-fat or nonfat frozen yogurt. Try not to eat pastries, cakes, pies, and cookies.  Exercise Follow your exercise program as told by your doctor.  Be more active. Try gardening, walking, and taking the stairs.  Ask your doctor about ways that you can be more active.  Medicine  Take over-the-counter and prescription medicines only as told by your doctor. This information is not intended to replace advice given to you by your health care provider. Make sure you discuss any questions you have with your health care provider. Document Released: 12/13/2008 Document Revised: 04/17/2016 Document Reviewed: 03/28/2016 Elsevier Interactive Patient Education  [employer].

## 2018-06-19 ENCOUNTER — Telehealth: Payer: Self-pay | Admitting: *Deleted

## 2018-06-19 NOTE — Telephone Encounter (Signed)
Left message for patient to call and schedule Lipid consult with the Pharm D

## 2018-06-24 NOTE — Telephone Encounter (Signed)
Left message for patient to call and schedule LIPID consult with Pharm D 

## 2018-07-16 ENCOUNTER — Encounter: Payer: Self-pay | Admitting: Cardiology

## 2018-08-20 ENCOUNTER — Other Ambulatory Visit: Payer: Self-pay | Admitting: Pharmacist Clinician (PhC)/ Clinical Pharmacy Specialist

## 2018-08-20 DIAGNOSIS — E785 Hyperlipidemia, unspecified: Secondary | ICD-10-CM

## 2018-08-20 NOTE — Telephone Encounter (Signed)
Lipid labs, 3 months on ezetimibe and lifestyle changes.

## 2019-01-20 ENCOUNTER — Telehealth: Payer: Self-pay | Admitting: *Deleted

## 2019-01-20 NOTE — Telephone Encounter (Signed)
12/31/18 LMOM @ 10:29 am, re: follow up appointment.

## 2019-04-22 ENCOUNTER — Encounter (HOSPITAL_COMMUNITY): Payer: Self-pay | Admitting: Emergency Medicine

## 2019-04-22 ENCOUNTER — Other Ambulatory Visit: Payer: Self-pay

## 2019-04-22 ENCOUNTER — Inpatient Hospital Stay (HOSPITAL_COMMUNITY)
Admission: EM | Admit: 2019-04-22 | Discharge: 2019-04-24 | DRG: 916 | Disposition: A | Discharge status: Home / Self Care | Payer: BC Managed Care – PPO | Attending: Family Medicine | Admitting: Family Medicine

## 2019-04-22 ENCOUNTER — Emergency Department (HOSPITAL_COMMUNITY): Payer: BC Managed Care – PPO

## 2019-04-22 DIAGNOSIS — T782XXA Anaphylactic shock, unspecified, initial encounter: Secondary | ICD-10-CM

## 2019-04-22 DIAGNOSIS — Z7982 Long term (current) use of aspirin: Secondary | ICD-10-CM

## 2019-04-22 DIAGNOSIS — T7804XA Anaphylactic reaction due to fruits and vegetables, initial encounter: Secondary | ICD-10-CM | POA: Diagnosis not present

## 2019-04-22 DIAGNOSIS — Z79899 Other long term (current) drug therapy: Secondary | ICD-10-CM

## 2019-04-22 DIAGNOSIS — I248 Other forms of acute ischemic heart disease: Secondary | ICD-10-CM | POA: Diagnosis present

## 2019-04-22 DIAGNOSIS — I2510 Atherosclerotic heart disease of native coronary artery without angina pectoris: Secondary | ICD-10-CM | POA: Diagnosis present

## 2019-04-22 DIAGNOSIS — D352 Benign neoplasm of pituitary gland: Secondary | ICD-10-CM | POA: Diagnosis present

## 2019-04-22 DIAGNOSIS — E785 Hyperlipidemia, unspecified: Secondary | ICD-10-CM | POA: Diagnosis present

## 2019-04-22 DIAGNOSIS — I10 Essential (primary) hypertension: Secondary | ICD-10-CM | POA: Diagnosis present

## 2019-04-22 DIAGNOSIS — N179 Acute kidney failure, unspecified: Secondary | ICD-10-CM | POA: Diagnosis present

## 2019-04-22 DIAGNOSIS — Z20828 Contact with and (suspected) exposure to other viral communicable diseases: Secondary | ICD-10-CM | POA: Diagnosis present

## 2019-04-22 DIAGNOSIS — E875 Hyperkalemia: Secondary | ICD-10-CM | POA: Diagnosis present

## 2019-04-22 DIAGNOSIS — R079 Chest pain, unspecified: Secondary | ICD-10-CM

## 2019-04-22 MED ORDER — SODIUM CHLORIDE 0.9 % IV BOLUS
1000.0000 mL | Freq: Once | INTRAVENOUS | Status: AC
  Administered 2019-04-22: 1000 mL via INTRAVENOUS

## 2019-04-22 MED ORDER — EPINEPHRINE 0.3 MG/0.3ML IJ SOAJ
INTRAMUSCULAR | Status: AC
  Administered 2019-04-22: 0.3 mg
  Filled 2019-04-22: qty 0.3

## 2019-04-22 MED ORDER — ASPIRIN 81 MG PO CHEW
324.0000 mg | CHEWABLE_TABLET | Freq: Once | ORAL | Status: AC
  Administered 2019-04-22: 324 mg via ORAL
  Filled 2019-04-22: qty 4

## 2019-04-22 NOTE — ED Notes (Signed)
Pt's son Aaron Edelman  Can be contacted at [phone], and pt's wife Marlowe Kays at [phone]. Please contact Marlowe Kays first.

## 2019-04-22 NOTE — ED Provider Notes (Addendum)
[facility] EMERGENCY DEPARTMENT Provider Note  CSN: [id number] Arrival date & time: 04/22/19 2246  Chief Complaint(s) Allergic Reaction  HPI Jesse Coleman is a 63 y.o. male with a past medical history listed below including hypertension, hyperlipidemia, CAD who presents to the emergency department for allergic reaction to unknown substance, but believes this might be due to home grown tomatoes.  He reports that the urticaria began yesterday morning at 4:00.  These gradually worsened throughout the day.  Around 4 PM he began to have chest pain and shortness of breath described as anginal pain that was substernal pressure, nonradiating.  He reports that typically his anginal pain is with exertion however this was at rest.  Additionally patient felt nauseated and lightheaded.  Reports that his wife took his blood pressure and he had systolics in the 00P.  Instead of calling EMS he went to go lay down.  When he woke up did not feel better, EMS was called.  He took nitroglycerin just prior to arrival.  Patient denies any oral swelling or throat closure.  Denies any prior allergic reactions.  In route with EMS patient was hypotensive.  Was given 500 cc of IV fluids and 50 mg of Benadryl.  HPI   Past Medical History Past Medical History:  Diagnosis Date  . CAD (coronary artery disease)   . Erectile dysfunction   . Hematospermia   . Hyperlipidemia   . Hypertension 2014  . Overweight(278.02)   . 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 Hyperlipidemia LDL goal <70 06/03/2018  . Chest pain   . Hypertension   . Prolactin-secreting pituitary adenoma ([place])    Home Medication(s) Prior to Admission medications   Medication Sig Start Date End Date Taking? Authorizing Provider  amLODipine (NORVASC) 5 MG tablet Take 5 mg by mouth daily.   Yes [provider]  aspirin EC 81 MG  tablet Take 81 mg by mouth daily.   Yes [provider]  bromocriptine (PARLODEL) 2.5 MG tablet Take 1.25 mg by mouth daily.  01/19/13  Yes [provider]  buPROPion (WELLBUTRIN XL) 150 MG 24 hr tablet Take 150 mg by mouth daily.   Yes [provider]  famotidine (PEPCID) 20 MG tablet Take 20 mg by mouth daily.   Yes [provider]  isosorbide mononitrate (IMDUR) 30 MG 24 hr tablet Take 30 mg by mouth daily.   Yes [provider]  lisinopril (PRINIVIL,ZESTRIL) 10 MG tablet Take 1 tablet (10 mg total) by mouth daily. 05/05/18  Yes Lelon Perla, MD  LORazepam (ATIVAN) 1 MG tablet Take 1 mg by mouth every 8 (eight) hours.  01/19/13  Yes [provider]  meloxicam (MOBIC) 15 MG tablet Take 15 mg by mouth daily.    Yes [provider]  metoprolol succinate (TOPROL-XL) 100 MG 24 hr tablet Take 100 mg by mouth daily. Take with or immediately following a meal.   Yes [provider]  NITROSTAT 0.4 MG SL tablet Place 0.4 mg under the tongue every 5 (five) minutes as needed for chest pain.  01/19/13  Yes [provider]  ezetimibe (ZETIA) 10 MG tablet Take 1 tablet (10 mg total) by mouth daily. Patient not taking: Reported on 04/23/2019 06/03/18 04/22/28  Lelon Perla, MD  Past Surgical History Past Surgical History:  Procedure Laterality Date  . COLONOSCOPY  [address] [place]  . Dental implant  2007  . [place]   [place], [place]  . KNEE SURGERY    . REFRACTIVE SURGERY Right 2003  . VASECTOMY  2003   Family History Family History  Problem Relation Age of Onset  . Coronary artery disease Father   . Emphysema Mother        smoked    Social History Social History   Tobacco Use  . Smoking status: Never Smoker  . Smokeless tobacco: Never Used  Substance Use  Topics  . Alcohol use: Yes    Alcohol/week: 1.0 standard drinks    Types: 1 drink(s) per week  . Drug use: No   Allergies Latex  Review of Systems Review of Systems All other systems are reviewed and are negative for acute change except as noted in the HPI  Physical Exam Vital Signs  I have reviewed the triage vital signs BP (!) 80/47   Pulse 71   Resp 18   Ht 5\' 10"  (1.778 m)   Wt 109.8 kg   SpO2 97%   BMI 34.72 kg/m   Physical Exam Vitals signs reviewed.  Constitutional:      General: He is not in acute distress.    Appearance: He is well-developed. He is not diaphoretic.  HENT:     Head: Normocephalic and atraumatic.     Nose: Nose normal.     Mouth/Throat:     Mouth: No angioedema.     Pharynx: No pharyngeal swelling or uvula swelling.  Eyes:     General: No scleral icterus.       Right eye: No discharge.        Left eye: No discharge.     Conjunctiva/sclera: Conjunctivae normal.     Pupils: Pupils are equal, round, and reactive to light.  Neck:     Musculoskeletal: Normal range of motion and neck supple.  Cardiovascular:     Rate and Rhythm: Normal rate and regular rhythm.     Heart sounds: No murmur. No friction rub. No gallop.   Pulmonary:     Effort: Pulmonary effort is normal. No respiratory distress.     Breath sounds: Normal breath sounds. No stridor. No rales.  Abdominal:     General: There is no distension.     Palpations: Abdomen is soft.     Tenderness: There is no abdominal tenderness.  Musculoskeletal:        General: No tenderness.  Skin:    General: Skin is warm and dry.     Findings: Rash present. No erythema. Rash is urticarial (to torso and proximal extremities).  Neurological:     Mental Status: He is alert and oriented to person, place, and time.     ED Results and Treatments Labs (all labs ordered are listed, but only abnormal results are displayed) Labs Reviewed  CBC - Abnormal; Notable for the following components:       Result Value   RBC 2.31 (*)    Hemoglobin 7.5 (*)    HCT 24.4 (*)    MCV 105.6 (*)    Platelets 100 (*)    All other components within normal limits  COMPREHENSIVE METABOLIC PANEL - Abnormal; Notable for the following components:   Chloride 120 (*)    CO2 13 (*)    BUN 24 (*)    Creatinine, Ser  1.42 (*)    Calcium 5.5 (*)    Total Protein 3.1 (*)    Albumin 1.9 (*)    AST 13 (*)    Alkaline Phosphatase 25 (*)    GFR calc non Af Amer 52 (*)    All other components within normal limits  COMPREHENSIVE METABOLIC PANEL - Abnormal; Notable for the following components:   Potassium 5.5 (*)    Chloride 114 (*)    CO2 17 (*)    Glucose, Bld 112 (*)    BUN 33 (*)    Creatinine, Ser 2.34 (*)    Calcium 7.6 (*)    Total Protein 4.4 (*)    Albumin 2.8 (*)    Alkaline Phosphatase 33 (*)    GFR calc non Af Amer 28 (*)    GFR calc Af Amer 33 (*)    All other components within normal limits  MAGNESIUM - Abnormal; Notable for the following components:   Magnesium 1.5 (*)    All other components within normal limits  CBC - Abnormal; Notable for the following components:   WBC 11.4 (*)    All other components within normal limits  POCT I-STAT EG7 - Abnormal; Notable for the following components:   pCO2, Ven 30.9 (*)    pO2, Ven 54.0 (*)    Bicarbonate 16.9 (*)    TCO2 18 (*)    Acid-base deficit 7.0 (*)    Potassium 5.4 (*)    Calcium, Ion 1.06 (*)    All other components within normal limits  TROPONIN I (HIGH SENSITIVITY) - Abnormal; Notable for the following components:   Troponin I (High Sensitivity) 145 (*)    All other components within normal limits  SARS CORONAVIRUS 2 (HOSPITAL ORDER, [facility])  TROPONIN I (HIGH SENSITIVITY)                                                                                                                         EKG  EKG Interpretation  Date/Time:  Thursday April 22 2019 23:10:43 EDT Ventricular Rate:  73 PR  Interval:    QRS Duration: 102 QT Interval:  363 QTC Calculation: 400 R Axis:   50 Text Interpretation:  Sinus rhythm Anterior infarct, old Minimal ST depression, lateral leads ST elevation, consider inferior injury, new from previous Confirmed by Addison Lank [phone]) on 04/23/2019 12:19:35 AM       EKG Interpretation  Date/Time:  Thursday April 22 2019 23:31:43 EDT Ventricular Rate:  72 PR Interval:    QRS Duration: 94 QT Interval:  354 QTC Calculation: 388 R Axis:   77 Text Interpretation:  Sinus rhythm Anterior infarct, old resolved inferior changes Confirmed by Addison Lank [phone]) on 04/23/2019 12:20:19 AM       Radiology Dg Chest Port 1 View  Result Date: 04/22/2019 CLINICAL DATA:  Chest pain EXAM: PORTABLE CHEST 1 VIEW COMPARISON:  01/29/2013 FINDINGS: The heart size and mediastinal contours are within normal  limits. Both lungs are clear. The visualized skeletal structures are unremarkable. IMPRESSION: No active disease. Electronically Signed   By: Inez Catalina M.D.   On: 04/22/2019 23:42    Pertinent labs & imaging results that were available during my care of the patient were reviewed by me and considered in my medical decision making (see chart for details).  Medications Ordered in ED Medications  sodium chloride 0.9 % bolus 1,000 mL (0 mLs Intravenous Stopped 04/23/19 0129)    Followed by  sodium chloride 0.9 % bolus 1,000 mL (1,000 mLs Intravenous New Bag/Given 04/23/19 0044)    Followed by  0.9 %  sodium chloride infusion (has no administration in time range)  EPINEPHrine (ADRENALIN) 4 mg in dextrose 5 % 250 mL (0.016 mg/mL) infusion (has no administration in time range)  sodium chloride 0.9 % bolus 1,000 mL (has no administration in time range)  EPINEPHrine (EPI-PEN) 0.3 mg/0.3 mL injection (0.3 mg  Given 04/22/19 2315)  aspirin chewable tablet 324 mg (324 mg Oral Given 04/22/19 2337)  sodium chloride 0.9 % bolus 1,000 mL (1,000 mLs Intravenous New Bag/Given  04/22/19 2339)  EPINEPHrine (EPI-PEN) injection 0.3 mg (0.3 mg Intramuscular Given 04/23/19 0045)  methylPREDNISolone sodium succinate (SOLU-MEDROL) 125 mg/2 mL injection 125 mg (125 mg Intravenous Given 04/23/19 0052)  famotidine (PEPCID) IVPB 20 mg premix (0 mg Intravenous Stopped 04/23/19 0129)                                                                                                                                    Procedures .Critical Care Performed by: Fatima Blank, MD Authorized by: Fatima Blank, MD   Critical care provider statement:    Critical care time (minutes):  55   Critical care was necessary to treat or prevent imminent or life-threatening deterioration of the following conditions:  Circulatory failure and shock (anaphylactic shock)   Critical care was time spent personally by me on the following activities:  Discussions with consultants, evaluation of patient's response to treatment, examination of patient, ordering and performing treatments and interventions, ordering and review of laboratory studies, ordering and review of radiographic studies, pulse oximetry, re-evaluation of patient's condition, obtaining history from patient or surrogate and review of old charts    (including critical care time)  Medical Decision Making / ED Course I have reviewed the nursing notes for this encounter and the patient's prior records (if available in EHR or on provided paperwork).   Jesse Coleman was evaluated in Emergency Department on 04/23/2019 for the symptoms described in the history of present illness. He was evaluated in the context of the global COVID-19 pandemic, which necessitated consideration that the patient might be at risk for infection with the SARS-CoV-2 virus that causes COVID-19. Institutional protocols and algorithms that pertain to the evaluation of patients at risk for COVID-19 are in a state of rapid change based on information released by  regulatory  bodies including the CDC and federal and state organizations. These policies and algorithms were followed during the patient's care in the ED.  1.  Allergic reaction. Patient is hypotensive which is concerning for anaphylactic reaction. Given additional bolus.  Given epinephrine. Solu-Medrol and Pepcid ordered.  EKG with mild inferior ST segment elevation with reciprocal changes.  This was discussed with Dr. Illene Bolus who was able to see the patient's prior cath that revealed 90% stenosis of the nondominant RCA.  In the setting of hypotension and anaphylaxis he believes that this is attributing to the changes.  Since the EKG was performed prior to the epinephrine he recommended repeating the EKG, which revealed resolved inferior changes.  He did not feel that this meant STEMI criteria and recommended continued medical management.  1:25 AM  Patient initially responded to IV fluids and epinephrine however when IV fluids were completed, patient became hypotensive again.  Additional IV fluids and second dose of epinephrine were ordered.  Initial troponin negative.  Labs notable for hemoglobin of 7.5 and platelets of 100.  Patient also noted to have electrolyte derangements with a calcium of 5.5 (corrected to 7.6).  Also noted to have AKI.  Will repeat labs to confirm.   Regardless it appears that patient will require admission for continued management.  He is showing some improvement on IV fluids.  Will reassess to establish the need for pressors.   2:24 AM  After 3L IVF completed, he became hypotensive again. Will add additional IVF and start pressor. Will admit to ICU.  Repeat labs with better CBC and Ca++, but AKI worse. Trop now elevated.     Final Clinical Impression(s) / ED Diagnoses Final diagnoses:  Chest pain  Anaphylactic shock      This chart was dictated using voice recognition software.  Despite best efforts to proofread,  errors can occur which can change the  documentation meaning.     Fatima Blank, MD 04/23/19 [phone]

## 2019-04-22 NOTE — ED Triage Notes (Addendum)
Pt coming by EMS after developing an allergic reaction early this morning around 0400. Hives began then and got worse throughout the day. Dizziness and nausea began this night. Pt unaware of what could have caused it. States he ate a locally grown tomato Wednesday and wonders if this is what caused the allergic reaction. Pt took 1 nitro at 2210 for shob/chest pain. Received 50mg  Benadryl IV, 4mg  Zofran IV, and 500cc bolus. Airway patent. Pt hypotensive, when fire arrived on scene systolic 24-46. Systolic increased to 28M for EMS

## 2019-04-22 NOTE — ED Notes (Signed)
Pt has hx of left bundle branch block

## 2019-04-23 ENCOUNTER — Inpatient Hospital Stay (HOSPITAL_COMMUNITY): Payer: BC Managed Care – PPO

## 2019-04-23 ENCOUNTER — Encounter (HOSPITAL_COMMUNITY): Payer: Self-pay | Admitting: Physician Assistant

## 2019-04-23 DIAGNOSIS — Z789 Other specified health status: Secondary | ICD-10-CM

## 2019-04-23 DIAGNOSIS — Z79899 Other long term (current) drug therapy: Secondary | ICD-10-CM | POA: Diagnosis not present

## 2019-04-23 DIAGNOSIS — Z20828 Contact with and (suspected) exposure to other viral communicable diseases: Secondary | ICD-10-CM | POA: Diagnosis present

## 2019-04-23 DIAGNOSIS — I361 Nonrheumatic tricuspid (valve) insufficiency: Secondary | ICD-10-CM

## 2019-04-23 DIAGNOSIS — I340 Nonrheumatic mitral (valve) insufficiency: Secondary | ICD-10-CM

## 2019-04-23 DIAGNOSIS — I248 Other forms of acute ischemic heart disease: Secondary | ICD-10-CM

## 2019-04-23 DIAGNOSIS — T7804XA Anaphylactic reaction due to fruits and vegetables, initial encounter: Secondary | ICD-10-CM | POA: Diagnosis present

## 2019-04-23 DIAGNOSIS — I10 Essential (primary) hypertension: Secondary | ICD-10-CM | POA: Diagnosis present

## 2019-04-23 DIAGNOSIS — Z7982 Long term (current) use of aspirin: Secondary | ICD-10-CM | POA: Diagnosis not present

## 2019-04-23 DIAGNOSIS — N179 Acute kidney failure, unspecified: Secondary | ICD-10-CM

## 2019-04-23 DIAGNOSIS — E875 Hyperkalemia: Secondary | ICD-10-CM | POA: Diagnosis present

## 2019-04-23 DIAGNOSIS — I214 Non-ST elevation (NSTEMI) myocardial infarction: Secondary | ICD-10-CM | POA: Diagnosis not present

## 2019-04-23 DIAGNOSIS — E7800 Pure hypercholesterolemia, unspecified: Secondary | ICD-10-CM

## 2019-04-23 DIAGNOSIS — T782XXA Anaphylactic shock, unspecified, initial encounter: Secondary | ICD-10-CM

## 2019-04-23 DIAGNOSIS — D352 Benign neoplasm of pituitary gland: Secondary | ICD-10-CM | POA: Diagnosis present

## 2019-04-23 DIAGNOSIS — I2510 Atherosclerotic heart disease of native coronary artery without angina pectoris: Secondary | ICD-10-CM | POA: Diagnosis present

## 2019-04-23 DIAGNOSIS — E785 Hyperlipidemia, unspecified: Secondary | ICD-10-CM | POA: Diagnosis present

## 2019-04-23 DIAGNOSIS — I25119 Atherosclerotic heart disease of native coronary artery with unspecified angina pectoris: Secondary | ICD-10-CM | POA: Diagnosis not present

## 2019-04-23 LAB — COMPREHENSIVE METABOLIC PANEL
ALT: 10 U/L (ref 0–44)
AST: 26 U/L (ref 15–41)
Albumin: 3 g/dL — ABNORMAL LOW (ref 3.5–5.0)
Alkaline Phosphatase: 34 U/L — ABNORMAL LOW (ref 38–126)
Anion gap: 10 (ref 5–15)
BUN: 35 mg/dL — ABNORMAL HIGH (ref 8–23)
CO2: 13 mmol/L — ABNORMAL LOW (ref 22–32)
Calcium: 7.8 mg/dL — ABNORMAL LOW (ref 8.9–10.3)
Chloride: 113 mmol/L — ABNORMAL HIGH (ref 98–111)
Creatinine, Ser: 2.5 mg/dL — ABNORMAL HIGH (ref 0.61–1.24)
GFR calc Af Amer: 30 mL/min — ABNORMAL LOW (ref >60)
GFR calc non Af Amer: 26 mL/min — ABNORMAL LOW (ref >60)
Glucose, Bld: 243 mg/dL — ABNORMAL HIGH (ref 70–99)
Potassium: 5.1 mmol/L (ref 3.5–5.1)
Sodium: 136 mmol/L (ref 135–145)
Total Bilirubin: 0.4 mg/dL (ref 0.3–1.2)
Total Protein: 4.8 g/dL — ABNORMAL LOW (ref 6.5–8.1)

## 2019-04-23 LAB — CBC
HCT: 24.4 % — ABNORMAL LOW (ref 39.0–52.0)
HCT: 46.3 % (ref 39.0–52.0)
Hemoglobin: 7.5 g/dL — ABNORMAL LOW (ref 13.0–17.0)
Hemoglobin: 15.2 g/dL (ref 13.0–17.0)
MCH: 32.5 pg (ref 26.0–34.0)
MCH: 32 pg (ref 26.0–34.0)
MCHC: 30.7 g/dL (ref 30.0–36.0)
MCHC: 32.8 g/dL (ref 30.0–36.0)
MCV: 105.6 fL — ABNORMAL HIGH (ref 80.0–100.0)
MCV: 97.5 fL (ref 80.0–100.0)
Platelets: 100 K/uL — ABNORMAL LOW (ref 150–400)
Platelets: 226 10*3/uL (ref 150–400)
RBC: 2.31 MIL/uL — ABNORMAL LOW (ref 4.22–5.81)
RBC: 4.75 MIL/uL (ref 4.22–5.81)
RDW: 13.4 % (ref 11.5–15.5)
RDW: 13.5 % (ref 11.5–15.5)
WBC: 4.2 K/uL (ref 4.0–10.5)
WBC: 11.4 10*3/uL — ABNORMAL HIGH (ref 4.0–10.5)
nRBC: 0 % (ref 0.0–0.2)
nRBC: 0 % (ref 0.0–0.2)

## 2019-04-23 LAB — POCT I-STAT EG7
Acid-base deficit: 7 mmol/L — ABNORMAL HIGH (ref 0.0–2.0)
Bicarbonate: 16.9 mmol/L — ABNORMAL LOW (ref 20.0–28.0)
Calcium, Ion: 1.06 mmol/L — ABNORMAL LOW (ref 1.15–1.40)
HCT: 45 % (ref 39.0–52.0)
Hemoglobin: 15.3 g/dL (ref 13.0–17.0)
O2 Saturation: 87 %
Potassium: 5.4 mmol/L — ABNORMAL HIGH (ref 3.5–5.1)
Sodium: 138 mmol/L (ref 135–145)
TCO2: 18 mmol/L — ABNORMAL LOW (ref 22–32)
pCO2, Ven: 30.9 mmHg — ABNORMAL LOW (ref 44.0–60.0)
pH, Ven: 7.347 (ref 7.250–7.430)
pO2, Ven: 54 mmHg — ABNORMAL HIGH (ref 32.0–45.0)

## 2019-04-23 LAB — TROPONIN I (HIGH SENSITIVITY)
Troponin I (High Sensitivity): 2 ng/L
Troponin I (High Sensitivity): 145 ng/L
Troponin I (High Sensitivity): 934 ng/L (ref <18)
Troponin I (High Sensitivity): 1275 ng/L (ref <18)

## 2019-04-23 LAB — COMPREHENSIVE METABOLIC PANEL WITH GFR
ALT: 7 U/L (ref 0–44)
ALT: 9 U/L (ref 0–44)
AST: 13 U/L — ABNORMAL LOW (ref 15–41)
AST: 23 U/L (ref 15–41)
Albumin: 1.9 g/dL — ABNORMAL LOW (ref 3.5–5.0)
Albumin: 2.8 g/dL — ABNORMAL LOW (ref 3.5–5.0)
Alkaline Phosphatase: 25 U/L — ABNORMAL LOW (ref 38–126)
Alkaline Phosphatase: 33 U/L — ABNORMAL LOW (ref 38–126)
Anion gap: 5 (ref 5–15)
Anion gap: 6 (ref 5–15)
BUN: 24 mg/dL — ABNORMAL HIGH (ref 8–23)
BUN: 33 mg/dL — ABNORMAL HIGH (ref 8–23)
CO2: 13 mmol/L — ABNORMAL LOW (ref 22–32)
CO2: 17 mmol/L — ABNORMAL LOW (ref 22–32)
Calcium: 5.5 mg/dL — CL (ref 8.9–10.3)
Calcium: 7.6 mg/dL — ABNORMAL LOW (ref 8.9–10.3)
Chloride: 120 mmol/L — ABNORMAL HIGH (ref 98–111)
Chloride: 114 mmol/L — ABNORMAL HIGH (ref 98–111)
Creatinine, Ser: 1.42 mg/dL — ABNORMAL HIGH (ref 0.61–1.24)
Creatinine, Ser: 2.34 mg/dL — ABNORMAL HIGH (ref 0.61–1.24)
GFR calc Af Amer: >60 mL/min
GFR calc Af Amer: 33 mL/min — ABNORMAL LOW
GFR calc non Af Amer: 52 mL/min — ABNORMAL LOW
GFR calc non Af Amer: 28 mL/min — ABNORMAL LOW
Glucose, Bld: 77 mg/dL (ref 70–99)
Glucose, Bld: 112 mg/dL — ABNORMAL HIGH (ref 70–99)
Potassium: 3.6 mmol/L (ref 3.5–5.1)
Potassium: 5.5 mmol/L — ABNORMAL HIGH (ref 3.5–5.1)
Sodium: 138 mmol/L (ref 135–145)
Sodium: 137 mmol/L (ref 135–145)
Total Bilirubin: 0.8 mg/dL (ref 0.3–1.2)
Total Bilirubin: 0.9 mg/dL (ref 0.3–1.2)
Total Protein: 3.1 g/dL — ABNORMAL LOW (ref 6.5–8.1)
Total Protein: 4.4 g/dL — ABNORMAL LOW (ref 6.5–8.1)

## 2019-04-23 LAB — GLUCOSE, CAPILLARY
Glucose-Capillary: 119 mg/dL — ABNORMAL HIGH (ref 70–99)
Glucose-Capillary: 123 mg/dL — ABNORMAL HIGH (ref 70–99)
Glucose-Capillary: 131 mg/dL — ABNORMAL HIGH (ref 70–99)
Glucose-Capillary: 146 mg/dL — ABNORMAL HIGH (ref 70–99)

## 2019-04-23 LAB — ECHOCARDIOGRAM COMPLETE
Height: 70 in
Weight: 3992.97 oz

## 2019-04-23 LAB — LACTIC ACID, PLASMA
Lactic Acid, Venous: 3.7 mmol/L (ref 0.5–1.9)
Lactic Acid, Venous: 1.9 mmol/L (ref 0.5–1.9)

## 2019-04-23 LAB — HIV ANTIBODY (ROUTINE TESTING W REFLEX): HIV Screen 4th Generation wRfx: NONREACTIVE

## 2019-04-23 LAB — MRSA PCR SCREENING: MRSA by PCR: NEGATIVE

## 2019-04-23 LAB — SARS CORONAVIRUS 2 BY RT PCR (HOSPITAL ORDER, PERFORMED IN ~~LOC~~ HOSPITAL LAB): SARS Coronavirus 2: NEGATIVE

## 2019-04-23 LAB — MAGNESIUM
Magnesium: 1.5 mg/dL — ABNORMAL LOW (ref 1.7–2.4)
Magnesium: 1.8 mg/dL (ref 1.7–2.4)

## 2019-04-23 LAB — HEPARIN LEVEL (UNFRACTIONATED)
Heparin Unfractionated: 0.42 IU/mL (ref 0.30–0.70)
Heparin Unfractionated: 0.58 IU/mL (ref 0.30–0.70)

## 2019-04-23 LAB — HEMOGLOBIN A1C
Hgb A1c MFr Bld: 5.4 % (ref 4.8–5.6)
Mean Plasma Glucose: 108.28 mg/dL

## 2019-04-23 MED ORDER — BUPROPION HCL ER (XL) 150 MG PO TB24
150.0000 mg | ORAL_TABLET | Freq: Every day | ORAL | Status: DC
  Administered 2019-04-23 – 2019-04-24 (×2): 150 mg via ORAL
  Filled 2019-04-23 (×2): qty 1

## 2019-04-23 MED ORDER — FAMOTIDINE IN NACL 20-0.9 MG/50ML-% IV SOLN
20.0000 mg | Freq: Two times a day (BID) | INTRAVENOUS | Status: DC
  Administered 2019-04-23 (×2): 20 mg via INTRAVENOUS
  Filled 2019-04-23 (×3): qty 50

## 2019-04-23 MED ORDER — CALCIUM GLUCONATE-NACL 1-0.675 GM/50ML-% IV SOLN
1.0000 g | Freq: Once | INTRAVENOUS | Status: AC
  Administered 2019-04-23: 09:00:00 1000 mg via INTRAVENOUS
  Filled 2019-04-23: qty 50

## 2019-04-23 MED ORDER — SODIUM CHLORIDE 0.9 % IV SOLN
1000.0000 mL | INTRAVENOUS | Status: DC
  Administered 2019-04-23 – 2019-04-24 (×2): 1000 mL via INTRAVENOUS

## 2019-04-23 MED ORDER — LORAZEPAM 1 MG PO TABS
1.0000 mg | ORAL_TABLET | Freq: Three times a day (TID) | ORAL | Status: DC
  Administered 2019-04-23 – 2019-04-24 (×4): 1 mg via ORAL
  Filled 2019-04-23 (×4): qty 1

## 2019-04-23 MED ORDER — DIPHENHYDRAMINE HCL 50 MG/ML IJ SOLN
25.0000 mg | Freq: Four times a day (QID) | INTRAMUSCULAR | Status: DC
  Administered 2019-04-23 – 2019-04-24 (×5): 25 mg via INTRAVENOUS
  Filled 2019-04-23 (×6): qty 1

## 2019-04-23 MED ORDER — HEPARIN SODIUM (PORCINE) 5000 UNIT/ML IJ SOLN: 5000.0000 [IU] | Freq: Three times a day (TID) | INTRAMUSCULAR | Status: DC

## 2019-04-23 MED ORDER — INSULIN ASPART 100 UNIT/ML ~~LOC~~ SOLN
0.0000 [IU] | SUBCUTANEOUS | Status: DC
  Administered 2019-04-23: 1 [IU] via SUBCUTANEOUS

## 2019-04-23 MED ORDER — FENTANYL CITRATE (PF) 100 MCG/2ML IJ SOLN
50.0000 ug | Freq: Once | INTRAMUSCULAR | Status: DC
  Filled 2019-04-23: qty 2

## 2019-04-23 MED ORDER — HEPARIN BOLUS VIA INFUSION
4000.0000 [IU] | Freq: Once | INTRAVENOUS | Status: AC
  Administered 2019-04-23: 4000 [IU] via INTRAVENOUS
  Filled 2019-04-23: qty 4000

## 2019-04-23 MED ORDER — ISOSORBIDE MONONITRATE ER 30 MG PO TB24: 30.0000 mg | ORAL_TABLET | Freq: Every day | ORAL | Status: DC

## 2019-04-23 MED ORDER — MORPHINE SULFATE (PF) 2 MG/ML IV SOLN
1.0000 mg | INTRAVENOUS | Status: DC | PRN
  Administered 2019-04-23 – 2019-04-24 (×3): 2 mg via INTRAVENOUS
  Filled 2019-04-23 (×3): qty 1

## 2019-04-23 MED ORDER — SODIUM CHLORIDE 0.9 % IV BOLUS (SEPSIS)
1000.0000 mL | Freq: Once | INTRAVENOUS | Status: AC
  Administered 2019-04-23: 1000 mL via INTRAVENOUS

## 2019-04-23 MED ORDER — BROMOCRIPTINE MESYLATE 2.5 MG PO TABS
1.2500 mg | ORAL_TABLET | Freq: Every day | ORAL | Status: DC
  Administered 2019-04-23 – 2019-04-24 (×2): 1.25 mg via ORAL
  Filled 2019-04-23 (×2): qty 1

## 2019-04-23 MED ORDER — FENTANYL CITRATE (PF) 100 MCG/2ML IJ SOLN
50.0000 ug | Freq: Once | INTRAMUSCULAR | Status: AC
  Administered 2019-04-23: 50 ug via INTRAVENOUS

## 2019-04-23 MED ORDER — HEPARIN (PORCINE) 25000 UT/250ML-% IV SOLN
1300.0000 [IU]/h | INTRAVENOUS | Status: DC
  Administered 2019-04-23 (×2): 1400 [IU]/h via INTRAVENOUS
  Filled 2019-04-23 (×3): qty 250

## 2019-04-23 MED ORDER — FAMOTIDINE IN NACL 20-0.9 MG/50ML-% IV SOLN
20.0000 mg | Freq: Once | INTRAVENOUS | Status: AC
Start: 2019-04-23 — End: 2019-04-23
  Administered 2019-04-23: 20 mg via INTRAVENOUS
  Filled 2019-04-23: qty 50

## 2019-04-23 MED ORDER — SODIUM CHLORIDE 0.9 % IV BOLUS
1000.0000 mL | Freq: Once | INTRAVENOUS | Status: AC
  Administered 2019-04-23: 1000 mL via INTRAVENOUS

## 2019-04-23 MED ORDER — METHYLPREDNISOLONE SODIUM SUCC 125 MG IJ SOLR
125.0000 mg | Freq: Once | INTRAMUSCULAR | Status: AC
Start: 2019-04-23 — End: 2019-04-23
  Administered 2019-04-23: 125 mg via INTRAVENOUS
  Filled 2019-04-23: qty 2

## 2019-04-23 MED ORDER — MAGNESIUM SULFATE 2 GM/50ML IV SOLN
2.0000 g | Freq: Once | INTRAVENOUS | Status: AC
  Administered 2019-04-23: 2 g via INTRAVENOUS
  Filled 2019-04-23: qty 50

## 2019-04-23 MED ORDER — ISOSORBIDE MONONITRATE ER 30 MG PO TB24
30.0000 mg | ORAL_TABLET | Freq: Every day | ORAL | Status: DC
  Administered 2019-04-23: 30 mg via ORAL
  Filled 2019-04-23: qty 1

## 2019-04-23 MED ORDER — NITROGLYCERIN 0.4 MG SL SUBL: 0.4000 mg | SUBLINGUAL_TABLET | SUBLINGUAL | Status: DC | PRN

## 2019-04-23 MED ORDER — ASPIRIN EC 81 MG PO TBEC
81.0000 mg | DELAYED_RELEASE_TABLET | Freq: Every day | ORAL | Status: DC
  Administered 2019-04-23 – 2019-04-24 (×2): 81 mg via ORAL
  Filled 2019-04-23 (×2): qty 1

## 2019-04-23 MED ORDER — METHYLPREDNISOLONE SODIUM SUCC 40 MG IJ SOLR
40.0000 mg | Freq: Four times a day (QID) | INTRAMUSCULAR | Status: DC
  Administered 2019-04-23 – 2019-04-24 (×4): 40 mg via INTRAVENOUS
  Filled 2019-04-23 (×4): qty 1

## 2019-04-23 MED ORDER — EPINEPHRINE 0.3 MG/0.3ML IJ SOAJ
0.3000 mg | Freq: Once | INTRAMUSCULAR | Status: AC
Start: 2019-04-23 — End: 2019-04-23
  Administered 2019-04-23: 0.3 mg via INTRAMUSCULAR
  Filled 2019-04-23: qty 0.3

## 2019-04-23 MED ORDER — EPINEPHRINE PF 1 MG/ML IJ SOLN
0.5000 ug/min | INTRAVENOUS | Status: DC
  Administered 2019-04-23: 0.5 ug/min via INTRAVENOUS
  Filled 2019-04-23: qty 4

## 2019-04-23 NOTE — Progress Notes (Signed)
[facility] Progress Note Patient Name: Jesse Coleman DOB: 10/17/1955 MRN: [id number]   Date of Service  04/23/2019  HPI/Events of Note  Pt with intermittent chest pain  eICU Interventions  Morphine 1-2 mg iv Q 3 hours prn chest pain        Frederik Pear 04/23/2019, 6:44 AM

## 2019-04-23 NOTE — Progress Notes (Signed)
ANTICOAGULATION CONSULT NOTE - [facility] for Heparin Indication: chest pain/ACS  Allergies  Allergen Reactions  . Latex Swelling    Patient Measurements: Height: 5\' 10"  (177.8 cm) Weight: 249 lb 9 oz (113.2 kg) IBW/kg (Calculated) : 73 Heparin Dosing Weight: 100kg  Vital Signs: Temp: 98.5 F (36.9 C) (07/24 1200) Temp Source: Oral (07/24 1200) BP: 117/70 (07/24 1000) Pulse Rate: 79 (07/24 1000)  Labs: Recent Labs    04/22/19 2329 04/23/19 0120 04/23/19 0127 04/23/19 0148 04/23/19 0546 04/23/19 0810  HGB 7.5*  --  15.3 15.2  --   --   HCT 24.4*  --  45.0 46.3  --   --   PLT 100*  --   --  226  --   --   CREATININE 1.42* 2.34*  --   --   --  2.50*  TROPONINIHS 2 145*  --   --  934* 1,275*    Estimated Creatinine Clearance: 37.6 mL/min (A) (by C-G formula based on SCr of 2.5 mg/dL (H)).   Medical History: Past Medical History:  Diagnosis Date  . CAD (coronary artery disease)   . Erectile dysfunction   . Hematospermia   . Hyperlipidemia   . Hypertension 2014  . Overweight(278.02)   . Prolactin-secreting pituitary adenoma [facility]) 1999   Medical therapy-1999    Medications:  Medications Prior to Admission  Medication Sig Dispense Refill Last Dose  . amLODipine (NORVASC) 5 MG tablet Take 5 mg by mouth daily.   04/22/2019 at Unknown time  . aspirin EC 81 MG tablet Take 81 mg by mouth daily.   04/22/2019 at Unknown time  . bromocriptine (PARLODEL) 2.5 MG tablet Take 1.25 mg by mouth daily.    04/22/2019 at Unknown time  . buPROPion (WELLBUTRIN XL) 150 MG 24 hr tablet Take 150 mg by mouth daily.   04/22/2019 at Unknown time  . famotidine (PEPCID) 20 MG tablet Take 20 mg by mouth daily.   04/22/2019 at Unknown time  . isosorbide mononitrate (IMDUR) 30 MG 24 hr tablet Take 30 mg by mouth daily.   04/22/2019 at Unknown time  . lisinopril (PRINIVIL,ZESTRIL) 10 MG tablet Take 1 tablet (10 mg total) by mouth daily. 90 tablet 3 04/22/2019 at Unknown  time  . LORazepam (ATIVAN) 1 MG tablet Take 1 mg by mouth every 8 (eight) hours.    04/22/2019 at Unknown time  . meloxicam (MOBIC) 15 MG tablet Take 15 mg by mouth daily.    04/22/2019 at Unknown time  . metoprolol succinate (TOPROL-XL) 100 MG 24 hr tablet Take 100 mg by mouth daily. Take with or immediately following a meal.   04/22/2019 at 0800  . NITROSTAT 0.4 MG SL tablet Place 0.4 mg under the tongue every 5 (five) minutes as needed for chest pain.    04/22/2019 at Unknown time  . ezetimibe (ZETIA) 10 MG tablet Take 1 tablet (10 mg total) by mouth daily. (Patient not taking: Reported on 04/23/2019) 30 tablet 6 Not Taking at Unknown time   Scheduled:  . aspirin EC  81 mg Oral Daily  . bromocriptine  1.25 mg Oral Daily  . buPROPion  150 mg Oral Daily  . diphenhydrAMINE  25 mg Intravenous Q6H  . insulin aspart  0-9 Units Subcutaneous Q4H  . isosorbide mononitrate  30 mg Oral Daily  . LORazepam  1 mg Oral Q8H  . methylPREDNISolone (SOLU-MEDROL) injection  40 mg Intravenous Q6H   Infusions:  . sodium chloride 125  mL/hr at 04/23/19 1000  . famotidine (PEPCID) IV 100 mL/hr at 04/23/19 1000  . heparin 1,400 Units/hr (04/23/19 1000)    Assessment: 63yo male admitted for allergic reaction and hypotension, reports CP associated with SOB though at rest unlike his usual angina which occurs with activity, troponin rising with EKG changes which may be d/t allergic reaction, to begin heparin pending further w/u.  A Heparin level this afternoon is therapeutic (HL 0.42, goal of 0.3-0.7). Cardiology consulting and evaluating for need to take for cardiac cath. No bleeding noted at this time.   Goal of Therapy:  Heparin level 0.3-0.7 units/ml Monitor platelets by anticoagulation protocol: Yes   Plan:  - Continue Heparin drip at 1400 units/hr (14 ml/hr) - Will continue to monitor for any signs/symptoms of bleeding and will follow up with heparin level in 6 hours to confirm therapeutic  Thank you for  allowing pharmacy to be a part of this patient's care.  Alycia Rossetti, PharmD, BCPS Clinical Pharmacist Clinical phone for 04/23/2019: [phone] 04/23/2019 2:35 PM   **Pharmacist phone directory can now be found on [email or web address] ([email or web address]).  Listed under [place].

## 2019-04-23 NOTE — ED Notes (Signed)
Informed NP about pt's tongue swelling. Awaiting orders

## 2019-04-23 NOTE — ED Provider Notes (Addendum)
Called to patient's room for chest pain described as substernal pressure that is nonradiating.  Typical for his anginal pain though now at rest.    Patient is diaphoretic in obvious discomfort.   Radial pulses intact bilaterally   Patient's blood pressure now with systolics in the 588F.  Repeat EKG notable for left bundle branch block, which she has a history of however this is an acute change from his EKG earlier in his encounter today.  We will titrate epi drip down to try to wean him off as this may be contributing to stress related demand ischemia.  Patient provided with 50 mics of fentanyl.  Contacted critical care and informed of patient's status.  They agreed with steps taken.  Asked that we continue to wean him off the epi and start hep.   CRITICAL CARE Performed by: Grayce Sessions Cardama Total critical care time: 20 minutes Critical care time was exclusive of separately billable procedures and treating other patients. Critical care was necessary to treat or prevent imminent or life-threatening deterioration. Critical care was time spent personally by me on the following activities: development of treatment plan with patient and/or surrogate as well as nursing, discussions with consultants, evaluation of patient's response to treatment, examination of patient, obtaining history from patient or surrogate, ordering and performing treatments and interventions, ordering and review of laboratory studies, ordering and review of radiographic studies, pulse oximetry and re-evaluation of patient's condition.       Fatima Blank, MD 04/23/19 [phone]

## 2019-04-23 NOTE — Progress Notes (Addendum)
VSS. Pt remains off epi infusion. Appreciate cardiology following and recs. Echo pending. Pt may transfer to [facility]. PCCM will sign off and [facility] will assume care 7/25. D/W Dr Doristine Bosworth.   Francine Graven, MSN, AGACNP  Pager [phone] or if no answer [phone] [facility] Pulmonary & Critical Care

## 2019-04-23 NOTE — Progress Notes (Signed)
ANTICOAGULATION CONSULT NOTE - Initial Consult  Pharmacy Consult for heparin Indication: chest pain/ACS  Allergies  Allergen Reactions  . Latex Swelling    Patient Measurements: Height: 5\' 10"  (177.8 cm) Weight: 242 lb (109.8 kg) IBW/kg (Calculated) : 73 Heparin Dosing Weight: 100kg  Vital Signs: BP: 129/81 (07/24 0505) Pulse Rate: 82 (07/24 0507)  Labs: Recent Labs    04/22/19 2329 04/23/19 0120 04/23/19 0127 04/23/19 0148  HGB 7.5*  --  15.3 15.2  HCT 24.4*  --  45.0 46.3  PLT 100*  --   --  226  CREATININE 1.42* 2.34*  --   --   TROPONINIHS 2 145*  --   --     Estimated Creatinine Clearance: 39.6 mL/min (A) (by C-G formula based on SCr of 2.34 mg/dL (H)).   Medical History: Past Medical History:  Diagnosis Date  . CAD (coronary artery disease)   . Erectile dysfunction   . Hematospermia   . Hyperlipidemia   . Hypertension 2014  . Overweight(278.02)   . Prolactin-secreting pituitary adenoma [facility]) 1999   Medical therapy-1999    Medications:  (Not in a hospital admission)  Scheduled:  . diphenhydrAMINE  25 mg Intravenous Q6H  . methylPREDNISolone (SOLU-MEDROL) injection  40 mg Intravenous Q6H   Infusions:  . sodium chloride 1,000 mL (04/23/19 0130)  . epinephrine 3.5 mcg/min (04/23/19 0410)  . famotidine (PEPCID) IV    . magnesium sulfate bolus IVPB 2 g (04/23/19 0439)    Assessment: 63yo male admitted for allergic reaction and hypotension, reports CP associated with SOB though at rest unlike his usual angina which occurs with activity, troponin rising with EKG changes which may be d/t allergic reaction, to begin heparin during further w/u.  Goal of Therapy:  Heparin level 0.3-0.7 units/ml Monitor platelets by anticoagulation protocol: Yes   Plan:  Will give heparin 4000 units IV bolus x1 followed by gtt at 1400 units/hr and monitor heparin levels and CBC.  Wynona Neat, PharmD, BCPS  04/23/2019,5:21 AM

## 2019-04-23 NOTE — H&P (Signed)
NAME:  Jesse Coleman, MRN:  [id number], DOB:  08-01-1956, LOS: 0 ADMISSION DATE:  04/22/2019, CONSULTATION DATE: 7/24 REFERRING MD:  Dr. Leonette Monarch EDP, CHIEF COMPLAINT: Anaphylaxis  Brief History   63 year old male admitted with presumed anaphylactic shock after consuming a new variety of tomato.  Required epinephrine infusion.  Admitted to ICU.  History of present illness   63 year old male with past medical history as below, which is significant for coronary artery disease, hypertension, and hyperlipidemia.  On 7/22 he and his wife were at the grocery store and purchased a variety of tomatoes which were new to him.  He ate them in the p.m. hours of 7/22.  Around 4 AM on 7/23 he noticed hives forming.  He did take Benadryl at that time however, has continued to worsen throughout the day.  Around 4 PM he became very tired and was having episodes of dizziness which prompted him to call EMS.  He also complained of chest pain and shortness of breath which prompted him to take sublingual nitroglycerin.  Upon EMS arrival he was hypotensive with systolic blood pressure to the 60s.  He was treated with IV fluids and IM epinephrine injection which were initially successful at improving his blood pressure.  However, it did continue to decline requiring the initiation of epinephrine infusion after 4 total liters of normal saline.  PCCM asked to admit to ICU due to epinephrine infusion.  Of note the patient never experienced any oral symptoms or throat swelling.  Laboratory evaluation in the emergency department significant for creatinine 2.34, potassium 5.5, magnesium 1.5, high-sensitivity troponin 145.  EKG in the emergency department significant for mild inferior segment ST elevation.  This was discussed with cardiologist on-call who felt that known lesions in the setting of profound hypotension were the likely culprit.  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   Significant Hospital Events   7/24 admit  Consults:    Procedures:    Significant Diagnostic Tests:    Micro Data:    Antimicrobials:     Interim history/subjective:    Objective   Blood pressure (!) 89/52, pulse 70, resp. rate 15, height 5\' 10"  (1.778 m), weight 109.8 kg, SpO2 97 %.       No intake or output data in the 24 hours ending 04/23/19 0356 Filed Weights   04/22/19 2253  Weight: 109.8 kg    Examination: General: middle aged adult male in NAD HENT: [place]/AT, PERRL, no JVD Lungs: Clear bilateral breath sounds Cardiovascular: RRR, no MRG Abdomen: Soft, non-tender, nondistended Extremities: Urticaria to arms, legs, groin.  Neuro: Alert, oriented, non-focal  Resolved Hospital Problem list     Assessment & Plan:   Anaphylactic shock: felt to be secondary to "local organic tomatoes" he has never had before. He cannot describe any other new food/substance he has encountered.  - Admit to ICU - Telemetry monitoring - Continue epinpehrine infusion for MAP goal >/ 42mmHg - If epi dose continues to rise will need CVL placed.  - Benadryl, solumedrol, pepcid scheduled  Inferior ST segment elevation: mild. 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 Cardiology has reviewed EKG and feel his old lesion in the setting of shock is responsible for EKG change.  - support BP - Repeat EKG in AM - Continue to trend troponin.   AKI: Hypomagnesemia Hypocalcemia (corrects to  8.6) Hyperkalemia - continue to support blood pressure - s/p 4 L IVF - Repeat BMP in a few hours.   History of hypertension - hold home amlodipine, imdur, lisinopril, metoprolol  Pituitary tumor: not amenable to surgical intervention - holding home bromocriptine for now.    Best practice:  Diet: NPO Pain/Anxiety/Delirium  protocol (if indicated): NA VAP protocol (if indicated): NA DVT prophylaxis: SQ heparin GI prophylaxis: Pepcid Glucose control: NA Mobility: BR Code Status: FULL Family Communication: Patient updated at length Disposition: ICU   Labs   CBC: Recent Labs  Lab 04/22/19 2329 04/23/19 0127 04/23/19 0148  WBC 4.2  --  11.4*  HGB 7.5* 15.3 15.2  HCT 24.4* 45.0 46.3  MCV 105.6*  --  97.5  PLT 100*  --  536    Basic Metabolic Panel: Recent Labs  Lab 04/22/19 2329 04/23/19 0120 04/23/19 0127  NA 138 137 138  K 3.6 5.5* 5.4*  CL 120* 114*  --   CO2 13* 17*  --   GLUCOSE 77 112*  --   BUN 24* 33*  --   CREATININE 1.42* 2.34*  --   CALCIUM 5.5* 7.6*  --   MG  --  1.5*  --    GFR: Estimated Creatinine Clearance: 39.6 mL/min (A) (by C-G formula based on SCr of 2.34 mg/dL (H)). Recent Labs  Lab 04/22/19 2329 04/23/19 0148  WBC 4.2 11.4*    Liver Function Tests: Recent Labs  Lab 04/22/19 2329 04/23/19 0120  AST 13* 23  ALT 7 9  ALKPHOS 25* 33*  BILITOT 0.8 0.9  PROT 3.1* 4.4*  ALBUMIN 1.9* 2.8*   No results for input(s): LIPASE, AMYLASE in the last 168 hours. No results for input(s): AMMONIA in the last 168 hours.  ABG    Component Value Date/Time   HCO3 16.9 (L) 04/23/2019 0127   TCO2 18 (L) 04/23/2019 0127   ACIDBASEDEF 7.0 (H) 04/23/2019 0127   O2SAT 87.0 04/23/2019 0127     Coagulation Profile: No results for input(s): INR, PROTIME in the last 168 hours.  Cardiac Enzymes: No results for input(s): CKTOTAL, CKMB, CKMBINDEX, TROPONINI in the last 168 hours.  HbA1C: No results found for: HGBA1C  CBG: No results for input(s): GLUCAP in the last 168 hours.  Review of Systems:   Bolds are positive at time of patient encounter Constitutional: weight loss, gain, night sweats, Fevers, chills, fatigue .  HEENT: headaches, Sore throat, sneezing, nasal congestion, post nasal drip, Difficulty swallowing, Tooth/dental problems, visual complaints visual  changes, ear ache CV:  chest pain, radiates:,Orthopnea, PND, swelling in lower extremities, dizziness, palpitations, syncope.  GI  heartburn, indigestion, abdominal pain, nausea, vomiting, diarrhea, change in bowel habits, loss of appetite, bloody stools.  Resp: cough, productive:, hemoptysis, dyspnea, chest pain, pleuritic.  Skin: rash urticaria or itching or icterus GU: dysuria, change in color of urine, urgency or frequency. flank pain, hematuria  MS: joint pain or swelling. decreased range of motion  Psych: change in mood or affect. depression or anxiety.  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   Surgical History    Past Surgical History:  Procedure Laterality Date  . COLONOSCOPY  [address] [place]  . Dental implant  2007  . [place]   [place], [place]  .  KNEE SURGERY    . REFRACTIVE SURGERY Right 2003  . VASECTOMY  2003     Social History   reports that he has never smoked. He has never used smokeless tobacco. He reports current alcohol use of about 1.0 standard drinks of alcohol per week. He reports that he does not use drugs.   Family History   His family history includes Coronary artery disease in his father; Emphysema in his mother.   Allergies Allergies  Allergen Reactions  . Latex Swelling     Home Medications  Prior to Admission medications   Medication Sig Start Date End Date Taking? Authorizing Provider  amLODipine (NORVASC) 5 MG tablet Take 5 mg by mouth daily.   Yes [provider]  aspirin EC 81 MG tablet Take 81 mg by mouth daily.   Yes [provider]  bromocriptine (PARLODEL) 2.5 MG tablet Take 1.25 mg by mouth daily.  01/19/13  Yes [provider]  buPROPion (WELLBUTRIN XL) 150 MG 24 hr tablet Take 150 mg by mouth daily.   Yes [provider]  famotidine (PEPCID) 20 MG tablet Take 20 mg by mouth daily.   Yes [provider]  isosorbide mononitrate (IMDUR) 30 MG 24 hr tablet Take 30 mg by mouth daily.   Yes [provider]  lisinopril (PRINIVIL,ZESTRIL) 10 MG tablet Take 1 tablet (10 mg total) by mouth daily. 05/05/18  Yes Lelon Perla, MD  LORazepam (ATIVAN) 1 MG tablet Take 1 mg by mouth every 8 (eight) hours.  01/19/13  Yes [provider]  meloxicam (MOBIC) 15 MG tablet Take 15 mg by mouth daily.    Yes [provider]  metoprolol succinate (TOPROL-XL) 100 MG 24 hr tablet Take 100 mg by mouth daily. Take with or immediately following a meal.   Yes [provider]  NITROSTAT 0.4 MG SL tablet Place 0.4 mg under the tongue every 5 (five) minutes as needed for chest pain.  01/19/13  Yes [provider]  ezetimibe (ZETIA) 10 MG tablet Take 1 tablet (10 mg total) by mouth daily. Patient not taking: Reported on 04/23/2019 06/03/18 04/22/28  Lelon Perla, MD     Critical care time: 30 mins     Georgann Housekeeper, AGACNP-BC [place] Pager [phone] or [phone]  04/23/2019 4:28 AM

## 2019-04-23 NOTE — Progress Notes (Addendum)
[facility] Progress Note Patient Name: Jesse Coleman DOB: 1956/08/10 MRN: [id number]   Date of Service  04/23/2019  HPI/Events of Note  Anaphylactic shock on epinephrine infusion.  eICU Interventions  Admit to ICU, wean off epi as tolerated, iv fluid bolus as needed, Monitor blood pressure closely. Continue with steroids, Benadryl, Pepcid.  New patient evaluation completed.        Kerry Kass Ogan 04/23/2019, 3:22 AM

## 2019-04-23 NOTE — ED Notes (Signed)
Report given to [facility] RN. All questions answered

## 2019-04-23 NOTE — Progress Notes (Signed)
ANTICOAGULATION CONSULT NOTE - [facility] for Heparin Indication: chest pain/ACS  Allergies  Allergen Reactions  . Latex Swelling    Patient Measurements: Height: 5\' 10"  (177.8 cm) Weight: 249 lb 9 oz (113.2 kg) IBW/kg (Calculated) : 73 Heparin Dosing Weight: 100kg  Vital Signs: Temp: 98.2 F (36.8 C) (07/24 1929) Temp Source: Oral (07/24 1929) BP: 130/76 (07/24 1929) Pulse Rate: 91 (07/24 1929)  Labs: Recent Labs    04/22/19 2329 04/23/19 0120 04/23/19 0127 04/23/19 0148 04/23/19 0546 04/23/19 0810 04/23/19 1356 04/23/19 2026  HGB 7.5*  --  15.3 15.2  --   --   --   --   HCT 24.4*  --  45.0 46.3  --   --   --   --   PLT 100*  --   --  226  --   --   --   --   HEPARINUNFRC  --   --   --   --   --   --  0.42 0.58  CREATININE 1.42* 2.34*  --   --   --  2.50*  --   --   TROPONINIHS 2 145*  --   --  934* 1,275*  --   --     Estimated Creatinine Clearance: 37.6 mL/min (A) (by C-G formula based on SCr of 2.5 mg/dL (H)).   Medical History: Past Medical History:  Diagnosis Date  . CAD (coronary artery disease)   . Erectile dysfunction   . Hematospermia   . Hyperlipidemia   . Hypertension 2014  . Overweight(278.02)   . Prolactin-secreting pituitary adenoma [facility]) 1999   Medical therapy-1999    Medications:  Medications Prior to Admission  Medication Sig Dispense Refill Last Dose  . amLODipine (NORVASC) 5 MG tablet Take 5 mg by mouth daily.   04/22/2019 at Unknown time  . aspirin EC 81 MG tablet Take 81 mg by mouth daily.   04/22/2019 at Unknown time  . bromocriptine (PARLODEL) 2.5 MG tablet Take 1.25 mg by mouth daily.    04/22/2019 at Unknown time  . buPROPion (WELLBUTRIN XL) 150 MG 24 hr tablet Take 150 mg by mouth daily.   04/22/2019 at Unknown time  . famotidine (PEPCID) 20 MG tablet Take 20 mg by mouth daily.   04/22/2019 at Unknown time  . isosorbide mononitrate (IMDUR) 30 MG 24 hr tablet Take 30 mg by mouth daily.   04/22/2019 at  Unknown time  . lisinopril (PRINIVIL,ZESTRIL) 10 MG tablet Take 1 tablet (10 mg total) by mouth daily. 90 tablet 3 04/22/2019 at Unknown time  . LORazepam (ATIVAN) 1 MG tablet Take 1 mg by mouth every 8 (eight) hours.    04/22/2019 at Unknown time  . meloxicam (MOBIC) 15 MG tablet Take 15 mg by mouth daily.    04/22/2019 at Unknown time  . metoprolol succinate (TOPROL-XL) 100 MG 24 hr tablet Take 100 mg by mouth daily. Take with or immediately following a meal.   04/22/2019 at 0800  . NITROSTAT 0.4 MG SL tablet Place 0.4 mg under the tongue every 5 (five) minutes as needed for chest pain.    04/22/2019 at Unknown time  . ezetimibe (ZETIA) 10 MG tablet Take 1 tablet (10 mg total) by mouth daily. (Patient not taking: Reported on 04/23/2019) 30 tablet 6 Not Taking at Unknown time   Scheduled:  . aspirin EC  81 mg Oral Daily  . bromocriptine  1.25 mg Oral Daily  . buPROPion  150 mg Oral Daily  . diphenhydrAMINE  25 mg Intravenous Q6H  . insulin aspart  0-9 Units Subcutaneous Q4H  . LORazepam  1 mg Oral Q8H  . methylPREDNISolone (SOLU-MEDROL) injection  40 mg Intravenous Q6H   Infusions:  . sodium chloride 125 mL/hr at 04/23/19 1400  . famotidine (PEPCID) IV Stopped (04/23/19 1024)  . heparin 1,400 Units/hr (04/23/19 2050)    Assessment: 63yo male admitted for allergic reaction and hypotension, reports CP associated with SOB though at rest unlike his usual angina which occurs with activity, troponin rising with EKG changes which may be d/t allergic reaction, to begin heparin pending further w/u.  A Heparin level this evening is therapeutic (HL 0.58, goal of 0.3-0.7). Cardiology consulting and evaluating for need to take for cardiac cath. No bleeding noted at this time.   Goal of Therapy:  Heparin level 0.3-0.7 units/ml Monitor platelets by anticoagulation protocol: Yes   Plan:  - Continue Heparin drip at 1400 units/hr (14 ml/hr) - Will continue to monitor for any signs/symptoms of bleeding   -Daily heparin level and CBC  Thank you for allowing pharmacy to be a part of this patient's care.  Marguerite Olea, [facility] Clinical Pharmacist Phone [phone]  04/23/2019 9:01 PM

## 2019-04-23 NOTE — ED Notes (Signed)
Pt's epip drip decreased per verbal MD orders

## 2019-04-23 NOTE — Progress Notes (Addendum)
NAME:  Jesse Coleman, MRN:  [id number], DOB:  [birth year], LOS: 0 ADMISSION DATE:  04/22/2019, CONSULTATION DATE: 7/24 REFERRING MD:  Dr. Leonette Monarch EDP, CHIEF COMPLAINT: Anaphylaxis  Brief History   63 year old male admitted with presumed anaphylactic shock after consuming a new variety of tomato.  Required epinephrine infusion.  Admitted to ICU.  History of present illness   63 year old male with past medical history as below, which is significant for coronary artery disease, hypertension, and hyperlipidemia.  On 7/22 he and his wife were at the grocery store and purchased a variety of tomatoes which were new to him.  He ate them in the p.m. hours of 7/22.  Around 4 AM on 7/23 he noticed hives forming.  He did take Benadryl at that time however, has continued to worsen throughout the day.  Around 4 PM he became very tired and was having episodes of dizziness which prompted him to call EMS.  He also complained of chest pain and shortness of breath which prompted him to take sublingual nitroglycerin.  Upon EMS arrival he was hypotensive with systolic blood pressure to the 60s.  He was treated with IV fluids and IM epinephrine injection which were initially successful at improving his blood pressure.  However, it did continue to decline requiring the initiation of epinephrine infusion after 4 total liters of normal saline.  PCCM asked to admit to ICU due to epinephrine infusion.  Of note the patient never experienced any oral symptoms or throat swelling.  Laboratory evaluation in the emergency department significant for creatinine 2.34, potassium 5.5, magnesium 1.5, high-sensitivity troponin 145.  EKG in the emergency department significant for mild inferior segment ST elevation.  This was discussed with cardiologist on-call who felt that known lesions in the setting of profound hypotension were the likely culprit.  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   Significant Hospital Events   7/24 admit  Consults:  Cardiology   Procedures:    Significant Diagnostic Tests:    Micro Data:  MRSA surveillance negative COVID-19 negative  Antimicrobials:   NA  Interim history/subjective:  C/O angina. "I know what fixes it. I need my little blue bill." Pt identifies Imdur in home med bag at bedside as the medicine he takes for chronic angina. Epi gtt weaned off.  VSS.  Continues to have mild hives.  No pruritus.  Objective   Blood pressure 127/74, pulse 88, temperature 98.4 F (36.9 C), temperature source Oral, resp. rate 16, height 5\' 10"  (1.778 m), weight 113.2 kg, SpO2 95 %.        Intake/Output Summary (Last 24 hours) at 04/23/2019 0758 Last data filed at 04/23/2019 0700 Gross per 24 hour  Intake 6687.41 ml  Output -  Net 6687.41 ml   Filed Weights   04/22/19 2253 04/23/19 0628  Weight: 109.8 kg 113.2 kg    Examination: General: Well-developed, well-nourished middle-age male, semi-Fowlers in bed.  No apparent distress. HENT: Normocephalic.  Pupils equal round reactive to light.  Moist mucous membranes. Neck: No JVD Lungs: BBS present, clear, full, nonlabored Cardiovascular: RRR. S1S2. No MRG Abdomen: + BS x4. SNT/ND Extremities: No edema.  Moves all extremities well.   Skin: Mild urticaria to arms, legs, groin and across upper back.  Neuro: A&Ox4. No focal deficits.   Resolved Hospital Problem list     Assessment & Plan:   Anaphylactic  shock: felt to be secondary to "local organic tomatoes" he has never had before. He cannot describe any other new food/substance he has encountered.  He has never had episodes like this before.  Been off of epi infusion.  Vital signs stable. - Telemetry monitoring -Continue benadryl, solumedrol, pepcid scheduled. Change to PO 7/25.    Inferior ST segment elevation: mild. 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 Cardiology reviewed EKG and felt his old lesion in the setting of shock is responsible for EKG change.  -Cardiology to see today. Appreciate consultation.  -Restart Imdur, aspirin.  -prn nitroglycerin sublingual  -Hold off restart of ACE inhibitor due to AKI and beta-blocker for now -Continue to trend troponin.  -Continue heparin infusion for now   AKI: A.m. labs pending Hypomagnesemia-labs pending Hypocalcemia Hyperkalemia -Follow-up renal panel and electrolytes, replace as needed  History of hypertension - hold home amlodipine, lisinopril, metoprolol now.  Restart as appropriate.  Pituitary tumor: not amenable to surgical intervention -resume home bromocriptine     Best practice:  Diet:NPO for now until eval by cardiology  Pain/Anxiety/Delirium protocol (if indicated): NA VAP protocol (if indicated): NA DVT prophylaxis: heparin GI prophylaxis: Pepcid Glucose control: SSI while on steroids.  Mobility: BR Code Status: FULL Family Communication: Patient updated at length Disposition: Continue to monitor in ICU.   Labs   CBC: Recent Labs  Lab 04/22/19 2329 04/23/19 0127 04/23/19 0148  WBC 4.2  --  11.4*  HGB 7.5* 15.3 15.2  HCT 24.4* 45.0 46.3  MCV 105.6*  --  97.5  PLT 100*  --  086    Basic Metabolic Panel: Recent Labs  Lab 04/22/19 2329 04/23/19 0120 04/23/19 0127  NA 138 137 138  K 3.6 5.5* 5.4*  CL 120* 114*  --   CO2 13* 17*  --   GLUCOSE 77 112*  --   BUN 24* 33*  --   CREATININE 1.42* 2.34*  --   CALCIUM 5.5* 7.6*  --   MG  --  1.5*  --    GFR: Estimated Creatinine Clearance: 40.2 mL/min (A) (by C-G formula based on SCr of 2.34 mg/dL (H)). Recent Labs  Lab 04/22/19 2329 04/23/19 0148  WBC 4.2 11.4*    Liver Function Tests: Recent Labs  Lab 04/22/19 2329 04/23/19 0120  AST 13* 23  ALT 7 9  ALKPHOS 25* 33*  BILITOT 0.8 0.9  PROT 3.1*  4.4*  ALBUMIN 1.9* 2.8*  Troponin 145>>934   Review of Systems:   Bolds are positive at time of patient encounter Constitutional: weight loss, gain, night sweats, Fevers, chills, fatigue .  HEENT: headaches, Sore throat, sneezing, nasal congestion, post nasal drip, Difficulty swallowing, Tooth/dental problems, visual complaints visual changes, ear ache CV:  chest pain, radiates:,Orthopnea, PND, swelling in lower extremities, dizziness, palpitations, syncope.  GI  heartburn, indigestion, abdominal pain, nausea, vomiting, diarrhea, change in bowel habits, loss of appetite, bloody stools.  Resp: cough, productive:, hemoptysis, dyspnea, chest pain, pleuritic.  Skin: rash urticaria or itching or icterus GU: dysuria, change in color of urine, urgency or frequency. flank pain, hematuria  MS: joint pain or swelling. decreased range of motion  Psych: change in mood or affect. depression or anxiety.  Neuro: difficulty with speech, weakness, numbness, ataxia     I spent 45 minutes in direct patient care including reviewing data,  discussing with other providers, assessment, planning and stabilization and documentation. Time is exclusive to this patient and does not include  procedures.    Francine Graven, MSN, AGACNP  Pager [phone] or if no answer [phone] [facility] Pulmonary & Critical Care

## 2019-04-23 NOTE — ED Notes (Signed)
ED TO INPATIENT HANDOFF REPORT  ED Nurse Name and Phone #: [phone] Lucita Ferrara Name/Age/Gender Jesse Coleman 63 y.o. male Room/Bed: 028C/028C  Code Status   Code Status: Full Code  Home/SNF/Other Home Patient oriented to: self, place, time and situation Is this baseline? Yes   Triage Complete: Triage complete  Chief Complaint Allergic Reaction   Triage Note Pt coming by EMS after developing an allergic reaction early this morning around 0400. Hives began then and got worse throughout the day. Dizziness and nausea began this night. Pt unaware of what could have caused it. States he ate a locally grown tomato Wednesday and wonders if this is what caused the allergic reaction. Pt took 1 nitro at 2210 for shob/chest pain. Received 50mg  Benadryl IV, 4mg  Zofran IV, and 500cc bolus. Airway patent. Pt hypotensive, when fire arrived on scene systolic 45-40. Systolic increased to 98J for EMS   Allergies Allergies  Allergen Reactions  . Latex Swelling    Level of Care/Admitting Diagnosis ED Disposition    ED Disposition Condition [facility] Area: [place] [[id number]]  Level of Care: ICU [6]  Covid Evaluation: Confirmed COVID Negative  Diagnosis: Anaphylaxis [[id number]]  Admitting Physician: Aldean Jewett [[id number]]  Attending Physician: Aldean Jewett [[id number]]  Estimated length of stay: 3 - 4 days  Certification:: I certify this patient will need inpatient services for at least 2 midnights  PT Class (Do Not Modify): Inpatient [101]  PT Acc Code (Do Not Modify): Private [1]       B Medical/Surgery History Past Medical History:  Diagnosis Date  . CAD (coronary artery disease)   . Erectile dysfunction   . Hematospermia   . Hyperlipidemia   . Hypertension 2014  . Overweight(278.02)   . 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 COLONOSCOPY  [address] [place]  . Dental implant  2007  . [facility]   [place], [place]  . KNEE SURGERY    . REFRACTIVE SURGERY Right 2003  . VASECTOMY  2003     A IV Location/Drains/Wounds Patient Lines/Drains/Airways Status   Active Line/Drains/Airways    Name:   Placement date:   Placement time:   Site:   Days:   Peripheral IV 02/02/13 Left Wrist   02/02/13    0741    Wrist   2271   Peripheral IV 04/22/19 Left;Posterior Hand   04/22/19    -    Hand   1   Peripheral IV 04/23/19 Right Antecubital   04/23/19    0050    Antecubital   less than 1          Intake/Output Last 24 hours No intake or output data in the 24 hours ending 04/23/19 0442  Labs/Imaging Results for orders placed or performed during the hospital encounter of 04/22/19 (from the past 48 hour(s))  CBC     Status: Abnormal   Collection Time: 04/22/19 11:29 PM  Result Value Ref Range   WBC 4.2 4.0 - 10.5 K/uL    Comment: QUESTIONABLE RESULTS, RECOMMEND RECOLLECT TO VERIFY RN Amora Sheehy GRINDSTASS AT 0145 ON 04/23/2019 BY MESSAN HOUEGNIFIO    RBC 2.31 (L) 4.22 - 5.81 MIL/uL    Comment: QUESTIONABLE RESULTS, RECOMMEND RECOLLECT TO VERIFY   Hemoglobin 7.5 (L) 13.0 - 17.0 g/dL    Comment: QUESTIONABLE RESULTS, RECOMMEND RECOLLECT TO VERIFY  HCT 24.4 (L) 39.0 - 52.0 %    Comment: QUESTIONABLE RESULTS, RECOMMEND RECOLLECT TO VERIFY   MCV 105.6 (H) 80.0 - 100.0 fL    Comment: QUESTIONABLE RESULTS, RECOMMEND RECOLLECT TO VERIFY   MCH 32.5 26.0 - 34.0 pg    Comment: QUESTIONABLE RESULTS, RECOMMEND RECOLLECT TO VERIFY   MCHC 30.7 30.0 - 36.0 g/dL    Comment: QUESTIONABLE RESULTS, RECOMMEND RECOLLECT TO VERIFY   RDW 13.4 11.5 - 15.5 %    Comment: QUESTIONABLE RESULTS, RECOMMEND RECOLLECT TO VERIFY   Platelets 100 (L) 150 - 400 K/uL    Comment: REPEATED TO VERIFY PLATELET COUNT CONFIRMED BY SMEAR SPECIMEN CHECKED FOR CLOTS Immature Platelet Fraction may be clinically indicated, consider ordering this additional  test [id number] QUESTIONABLE RESULTS, RECOMMEND RECOLLECT TO VERIFY    nRBC 0.0 0.0 - 0.2 %    Comment: QUESTIONABLE RESULTS, RECOMMEND RECOLLECT TO VERIFY Performed at [facility], [place] [address], [place], [place] [address]   Comprehensive metabolic panel     Status: Abnormal   Collection Time: 04/22/19 11:29 PM  Result Value Ref Range   Sodium 138 135 - 145 mmol/L   Potassium 3.6 3.5 - 5.1 mmol/L   Chloride 120 (H) 98 - 111 mmol/L   CO2 13 (L) 22 - 32 mmol/L   Glucose, Bld 77 70 - 99 mg/dL   BUN 24 (H) 8 - 23 mg/dL   Creatinine, Ser 1.42 (H) 0.61 - 1.24 mg/dL   Calcium 5.5 (LL) 8.9 - 10.3 mg/dL    Comment: CRITICAL RESULT CALLED TO, READ BACK BY AND VERIFIED WITH: Musab Wingard,S RN 04/23/2019 0054 JORDANS    Total Protein 3.1 (L) 6.5 - 8.1 g/dL   Albumin 1.9 (L) 3.5 - 5.0 g/dL   AST 13 (L) 15 - 41 U/L   ALT 7 0 - 44 U/L   Alkaline Phosphatase 25 (L) 38 - 126 U/L   Total Bilirubin 0.8 0.3 - 1.2 mg/dL   GFR calc non Af Amer 52 (L) >60 mL/min   GFR calc Af Amer >60 >60 mL/min   Anion gap 5 5 - 15    Comment: Performed at [facility], [place] [address], [place], [place] [address]  Troponin I (High Sensitivity)     Status: None   Collection Time: 04/22/19 11:29 PM  Result Value Ref Range   Troponin I (High Sensitivity) 2 <18 ng/L    Comment: (NOTE) Elevated high sensitivity troponin I (hsTnI) values and significant  changes across serial measurements may suggest ACS but many other  chronic and acute conditions are known to elevate hsTnI results.  Refer to the Links section for chest pain algorithms and additional  guidance. Performed at [facility], [place] [address], [place], [place] [address]   Troponin I (High Sensitivity)     Status: Abnormal   Collection Time: 04/23/19  1:20 AM  Result Value Ref Range   Troponin I (High Sensitivity) 145 (HH) <18 ng/L    Comment: CRITICAL RESULT CALLED TO, READ BACK BY AND VERIFIED WITH: Pedram Goodchild,S RN 04/23/2019 0213  JORDANS (NOTE) Elevated high sensitivity troponin I (hsTnI) values and significant  changes across serial measurements may suggest ACS but many other  chronic and acute conditions are known to elevate hsTnI results.  Refer to the Links section for chest pain algorithms and additional  guidance. Performed at [facility], [place] [address], [place], [place] [address]   Comprehensive metabolic panel     Status:  Abnormal   Collection Time: 04/23/19  1:20 AM  Result Value Ref Range   Sodium 137 135 - 145 mmol/L   Potassium 5.5 (H) 3.5 - 5.1 mmol/L    Comment: DELTA CHECK NOTED   Chloride 114 (H) 98 - 111 mmol/L   CO2 17 (L) 22 - 32 mmol/L   Glucose, Bld 112 (H) 70 - 99 mg/dL   BUN 33 (H) 8 - 23 mg/dL   Creatinine, Ser 2.34 (H) 0.61 - 1.24 mg/dL   Calcium 7.6 (L) 8.9 - 10.3 mg/dL    Comment: DELTA CHECK NOTED   Total Protein 4.4 (L) 6.5 - 8.1 g/dL   Albumin 2.8 (L) 3.5 - 5.0 g/dL   AST 23 15 - 41 U/L   ALT 9 0 - 44 U/L   Alkaline Phosphatase 33 (L) 38 - 126 U/L   Total Bilirubin 0.9 0.3 - 1.2 mg/dL   GFR calc non Af Amer 28 (L) >60 mL/min   GFR calc Af Amer 33 (L) >60 mL/min   Anion gap 6 5 - 15    Comment: Performed at [facility], [place] [address], [place], [place] [address]  Magnesium     Status: Abnormal   Collection Time: 04/23/19  1:20 AM  Result Value Ref Range   Magnesium 1.5 (L) 1.7 - 2.4 mg/dL    Comment: Performed at [facility] [address] [place], [place] [address]  SARS Coronavirus 2 (CEPHEID - Performed in [place] [facility] lab), Hosp Order     Status: None   Collection Time: 04/23/19  1:24 AM   Specimen: Nasopharyngeal Swab  Result Value Ref Range   SARS Coronavirus 2 NEGATIVE NEGATIVE    Comment: (NOTE) If result is NEGATIVE SARS-CoV-2 target nucleic acids are NOT DETECTED. The SARS-CoV-2 RNA is generally detectable in upper and lower  respiratory specimens during the acute phase of infection. The lowest  concentration of SARS-CoV-2  viral copies this assay can detect is 250  copies / mL. A negative result does not preclude SARS-CoV-2 infection  and should not be used as the sole basis for treatment or other  patient management decisions.  A negative result may occur with  improper specimen collection / handling, submission of specimen other  than nasopharyngeal swab, presence of viral mutation(s) within the  areas targeted by this assay, and inadequate number of viral copies  (<250 copies / mL). A negative result must be combined with clinical  observations, patient history, and epidemiological information. If result is POSITIVE SARS-CoV-2 target nucleic acids are DETECTED. The SARS-CoV-2 RNA is generally detectable in upper and lower  respiratory specimens dur ing the acute phase of infection.  Positive  results are indicative of active infection with SARS-CoV-2.  Clinical  correlation with patient history and other diagnostic information is  necessary to determine patient infection status.  Positive results do  not rule out bacterial infection or co-infection with other viruses. If result is PRESUMPTIVE POSTIVE SARS-CoV-2 nucleic acids MAY BE PRESENT.   A presumptive positive result was obtained on the submitted specimen  and confirmed on repeat testing.  While 2019 novel coronavirus  (SARS-CoV-2) nucleic acids may be present in the submitted sample  additional confirmatory testing may be necessary for epidemiological  and / or clinical management purposes  to differentiate between  SARS-CoV-2 and other Sarbecovirus currently known to infect humans.  If clinically indicated additional testing with an alternate test  methodology [phone]) is advised. The SARS-CoV-2 RNA  is generally  detectable in upper and lower respiratory sp ecimens during the acute  phase of infection. The expected result is Negative. Fact Sheet for Patients:  StrictlyIdeas.no Fact Sheet for Healthcare  Providers: BankingDealers.co.za This test is not yet approved or cleared by the [place] FDA and has been authorized for detection and/or diagnosis of SARS-CoV-2 by FDA under an Emergency Use Authorization (EUA).  This EUA will remain in effect (meaning this test can be used) for the duration of the COVID-19 declaration under Section 564(b)(1) of the Act, 21 U.S.C. section 360bbb-3(b)(1), unless the authorization is terminated or revoked sooner. Performed at [facility], [place] [address], [place], [place] [address]   POCT I-Stat EG7     Status: Abnormal   Collection Time: 04/23/19  1:27 AM  Result Value Ref Range   pH, Ven 7.347 7.250 - 7.430   pCO2, Ven 30.9 (L) 44.0 - 60.0 mmHg   pO2, Ven 54.0 (H) 32.0 - 45.0 mmHg   Bicarbonate 16.9 (L) 20.0 - 28.0 mmol/L   TCO2 18 (L) 22 - 32 mmol/L   O2 Saturation 87.0 %   Acid-base deficit 7.0 (H) 0.0 - 2.0 mmol/L   Sodium 138 135 - 145 mmol/L   Potassium 5.4 (H) 3.5 - 5.1 mmol/L   Calcium, Ion 1.06 (L) 1.15 - 1.40 mmol/L   HCT 45.0 39.0 - 52.0 %   Hemoglobin 15.3 13.0 - 17.0 g/dL   Patient temperature HIDE    Sample type VENOUS   CBC     Status: Abnormal   Collection Time: 04/23/19  1:48 AM  Result Value Ref Range   WBC 11.4 (H) 4.0 - 10.5 K/uL   RBC 4.75 4.22 - 5.81 MIL/uL   Hemoglobin 15.2 13.0 - 17.0 g/dL   HCT 46.3 39.0 - 52.0 %   MCV 97.5 80.0 - 100.0 fL   MCH 32.0 26.0 - 34.0 pg   MCHC 32.8 30.0 - 36.0 g/dL   RDW 13.5 11.5 - 15.5 %   Platelets 226 150 - 400 K/uL   nRBC 0.0 0.0 - 0.2 %    Comment: Performed at [facility], [place] [address], [place], [place] [address]   Dg Chest Port 1 View  Result Date: 04/22/2019 CLINICAL DATA:  Chest pain EXAM: PORTABLE CHEST 1 VIEW COMPARISON:  01/29/2013 FINDINGS: The heart size and mediastinal contours are within normal limits. Both lungs are clear. The visualized skeletal structures are unremarkable. IMPRESSION: No active disease. Electronically Signed    By: Inez Catalina M.D.   On: 04/22/2019 23:42    Pending Labs Unresulted Labs (From admission, onward)    Start     Ordered   04/24/19 0500  CBC  Tomorrow morning,   R     04/23/19 0422   04/24/19 0354  Basic metabolic panel  Tomorrow morning,   R     04/23/19 0422   04/24/19 0500  Magnesium  Tomorrow morning,   R     04/23/19 0422   04/23/19 0800  Comprehensive metabolic panel  Once,   STAT     04/23/19 0422   04/23/19 0800  Magnesium  Once,   STAT     04/23/19 0422   04/23/19 0800  Lactic acid, plasma  STAT Now then every 3 hours,   R (with STAT occurrences)     04/23/19 0422   04/23/19 0420  HIV antibody (Routine Testing)  Once,   STAT     04/23/19 0422  Vitals/Pain Today's Vitals   04/23/19 0345 04/23/19 0350 04/23/19 0400 04/23/19 0405  BP: (!) 86/53 (!) 89/52 (!) 85/65 (!) 87/65  Pulse: 67 70 70 67  Resp: 16 15 20 16   SpO2: 96% 97% 96% 95%  Weight:      Height:      PainSc:        Isolation Precautions No active isolations  Medications Medications  sodium chloride 0.9 % bolus 1,000 mL (0 mLs Intravenous Stopped 04/23/19 0129)    Followed by  sodium chloride 0.9 % bolus 1,000 mL (0 mLs Intravenous Stopped 04/23/19 0140)    Followed by  0.9 %  sodium chloride infusion (1,000 mLs Intravenous New Bag/Given 04/23/19 0130)  EPINEPHrine (ADRENALIN) 4 mg in dextrose 5 % 250 mL (0.016 mg/mL) infusion (3.5 mcg/min Intravenous Rate/Dose Change 04/23/19 0410)  magnesium sulfate IVPB 2 g 50 mL (2 g Intravenous New Bag/Given 04/23/19 0439)  heparin injection 5,000 Units (has no administration in time range)  famotidine (PEPCID) IVPB 20 mg premix (has no administration in time range)  methylPREDNISolone sodium succinate (SOLU-MEDROL) 40 mg/mL injection 40 mg (has no administration in time range)  diphenhydrAMINE (BENADRYL) injection 25 mg (25 mg Intravenous Given 04/23/19 0429)  EPINEPHrine (EPI-PEN) 0.3 mg/0.3 mL injection (0.3 mg  Given 04/22/19 2315)  aspirin  chewable tablet 324 mg (324 mg Oral Given 04/22/19 2337)  sodium chloride 0.9 % bolus 1,000 mL (1,000 mLs Intravenous New Bag/Given 04/22/19 2339)  EPINEPHrine (EPI-PEN) injection 0.3 mg (0.3 mg Intramuscular Given 04/23/19 0045)  methylPREDNISolone sodium succinate (SOLU-MEDROL) 125 mg/2 mL injection 125 mg (125 mg Intravenous Given 04/23/19 0052)  famotidine (PEPCID) IVPB 20 mg premix (0 mg Intravenous Stopped 04/23/19 0129)  sodium chloride 0.9 % bolus 1,000 mL (1,000 mLs Intravenous New Bag/Given 04/23/19 0233)    Mobility walks with person assist Low fall risk   Focused Assessments Cardiac Assessment Handoff:    No results found for: CKTOTAL, CKMB, CKMBINDEX, TROPONINI No results found for: DDIMER Does the Patient currently have chest pain? No     R Recommendations: See Admitting Provider Note  Report given to:   Additional Notes: N/A

## 2019-04-23 NOTE — Progress Notes (Signed)
Patient arrived via stretcher to [phone]. Patient alert and oriented X4. Pupils equal and reactive. Moves all four extremities. Patient walked from stretcher to bed with  No issues. Patient hooked up to cardiac monitor. VSS. Afebrile. Patient has two PIV's, 20g L hand and 20g R AC, Flushes with flahsback. Rt PIV has Heparin @1400  units and NS@125 , LT PIV has Epi @1 .5. Patient is on room air with O2 sats of 96%. Rash noted on chest, bilateral arms, back, groin and neck. Patient c/o chest pain. Elink notified and morphine ordered and given. Patient resting in bed with urinal, call bell, bedside table, and personal cell phone within reach. Patient also has home meds on bedside table in a ziplock bag. This passed on to primary day shift nurse.

## 2019-04-23 NOTE — ED Notes (Signed)
MD informed of trop

## 2019-04-23 NOTE — Consult Note (Addendum)
Cardiology Consultation:   Patient ID: Jesse Coleman; [id number]; Mar 03, 1956   Admit date: 04/22/2019 Date of Consult: 04/23/2019  Primary Care Provider: Reynold Bowen, MD Primary Cardiologist: Kirk Ruths, MD 05/05/2018 Primary Electrophysiologist:  None   Patient Profile:   Jesse Coleman is a 63 y.o. male with a hx of LBBB, CAD >>2014 cath treated medically, HTN, HLD intol statins, remote hx pituitary adenoma, who is being seen today for the evaluation of abnormal ECG, chest pain at the request of Dr Vaughan Browner.  History of Present Illness:   Jesse Coleman was admitted 07/24 am w/ anaphylaxis reaction to tomatoes. Required Epi gtt. He developed intermittent CP overnight>>ECG abnl>>cards asked to see.   The allergic reaction started about 4 AM yesterday.  He took Benadryl and dealt with the symptoms.    At approximately 10 PM yesterday, he was feeling lightheaded and dizzy.  His wife and he both tried to get a blood pressure, but could not.    He developed severe chest pain, 10/10, his usual angina.  He took a sublingual nitroglycerin which helped the chest pain and it eventually resolved.  He is not sure if the chest pain and nitroglycerin were before or after he was unable to get a blood pressure.  EMS was called.  Systolic blood pressure was in the 60s when EMS arrived, he has been given IV fluids and epinephrine with improvement in his condition.  He has never had an allergic reaction like that before.  He thinks it was from some local tomatoes, but has never had any kind of allergic reaction to tomatoes before.  Currently, he has hives over the upper two thirds of his body including his entire torso, that itch.  He is not currently having chest pain or shortness of breath.  Prior to his acute illness, he would rarely get chest pain.  His medications do a good job at controlling his angina.  He is careful to take them as directed.  When he wants to do something strenuous, he will  take half an Imdur prior to the exertion and then can take the other half later if he needs to.  He has not had to take a nitroglycerin in probably 6 months.  Does not wake with lower extremity edema, no orthopnea or PND.   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 Authorizing Provider  amLODipine (NORVASC) 5 MG tablet Take 5 mg by mouth daily.   Yes [provider]  aspirin EC 81 MG tablet Take 81 mg by mouth daily.   Yes [provider]  bromocriptine (PARLODEL) 2.5 MG tablet Take 1.25 mg by mouth daily.  01/19/13  Yes [provider]  buPROPion (WELLBUTRIN XL) 150 MG 24 hr tablet Take 150 mg by mouth daily.   Yes [provider]  famotidine (PEPCID) 20 MG tablet Take 20 mg by mouth daily.   Yes [provider]  isosorbide mononitrate (IMDUR) 30 MG 24 hr tablet Take 30 mg by mouth daily.   Yes [provider]  lisinopril (PRINIVIL,ZESTRIL) 10 MG tablet Take 1 tablet (10 mg total) by mouth daily. 05/05/18  Yes Lelon Perla, MD  LORazepam (ATIVAN) 1 MG tablet Take 1 mg by mouth every 8 (eight) hours.  01/19/13  Yes [provider]  meloxicam (MOBIC) 15 MG tablet Take 15 mg by mouth daily.    Yes [provider]  metoprolol succinate  (TOPROL-XL) 100 MG 24 hr tablet Take 100 mg by mouth daily. Take with or immediately following a meal.   Yes [provider]  NITROSTAT 0.4 MG SL tablet Place 0.4 mg under the tongue every 5 (five) minutes as needed for chest pain.  01/19/13  Yes [provider]  ezetimibe (ZETIA) 10 MG tablet Take 1 tablet (10 mg total) by mouth daily. Patient not taking: Reported on 04/23/2019 06/03/18 04/22/28  Lelon Perla, MD    Inpatient Medications: Scheduled Meds:  aspirin EC  81 mg Oral Daily   bromocriptine  1.25 mg Oral Daily   buPROPion  150 mg Oral Daily   diphenhydrAMINE  25 mg Intravenous Q6H   insulin aspart  0-9 Units Subcutaneous Q4H   isosorbide mononitrate  30 mg Oral Daily   LORazepam  1 mg Oral Q8H   methylPREDNISolone (SOLU-MEDROL) injection  40 mg Intravenous Q6H   Continuous Infusions:  sodium chloride 125 mL/hr at 04/23/19 1000   famotidine (PEPCID) IV 100 mL/hr at 04/23/19 1000   heparin 1,400 Units/hr (04/23/19 1000)   PRN Meds: morphine injection, nitroGLYCERIN  Allergies:    Allergies  Allergen Reactions   Latex Swelling    Social History:   Social History   Socioeconomic History   Marital status: Married    Spouse name: Not on file   Number of children: 2   Years of education: Not on file   Highest education level: Not on file  Occupational History   Occupation: Real estate Scientist, clinical (histocompatibility and immunogenetics): [employer]: Not on file   Food insecurity    Worry: Not on file    Inability: Not on file   Transportation needs    Medical: Not on file    Non-medical: Not on file  Tobacco Use   Smoking status: Never Smoker   Smokeless tobacco: Never Used  Substance and Sexual Activity   Alcohol use: Yes    Alcohol/week: 1.0 standard drinks    Types: 1 drink(s) per week   Drug use: No   Sexual activity: Not on file  Lifestyle   Physical activity    Days per week: Not on file     Minutes per session: Not on file   Stress: Not on file  Relationships   Social connections    Talks on phone: Not on file    Gets together: Not on file    Attends religious service: Not on file    Active member of club or organization: Not on file    Attends meetings of clubs or organizations: Not on file    Relationship status: Not on file   Intimate partner violence    Fear of  current or ex partner: Not on file    Emotionally abused: Not on file    Physically abused: Not on file    Forced sexual activity: Not on file  Other Topics Concern   Not on file  Social History Narrative   Not on file    Family History:   Family History  Problem Relation Age of Onset   Coronary artery disease Father 22       sudden cardiac death   Emphysema Mother        smoked   Family Status:  Family Status  Relation Name Status   Father  Deceased at age 34       Coronary artery disease   Mother  Deceased       Unknown    ROS:  Please see the history of present illness.  All other ROS reviewed and negative.     Physical Exam/Data:   Vitals:   04/23/19 0720 04/23/19 0800 04/23/19 0900 04/23/19 1000  BP: 127/74 119/63 120/73 117/70  Pulse: 88 86 82 79  Resp: 16 14 17 15   Temp:  97.9 F (36.6 C)    TempSrc:  Oral    SpO2: 95% 97% 96% 94%  Weight:      Height:        Intake/Output Summary (Last 24 hours) at 04/23/2019 1054 Last data filed at 04/23/2019 1000 Gross per 24 hour  Intake 7114.09 ml  Output --  Net 7114.09 ml   Filed Weights   04/22/19 2253 04/23/19 0628  Weight: 109.8 kg 113.2 kg   Body mass index is 35.81 kg/m.  General:  Well nourished, well developed, male in mild distress HEENT: normal Lymph: no adenopathy Neck: no JVD Endocrine:  No thryomegaly Vascular: No carotid bruits; 4/4 extremity pulses 2+, without bruits  Cardiac:  normal S1, S2; RRR; no murmur  Lungs:  clear to auscultation bilaterally, no wheezing, rhonchi or rales  Abd: soft,  nontender, no hepatomegaly  Ext: no edema Musculoskeletal:  No deformities, BUE and BLE strength normal and equal Skin: warm and dry; Hives over entire torso and head, some on legs as well Neuro:  CNs 2-12 intact, no focal abnormalities noted Psych:  Normal affect   EKG:  The EKG was personally reviewed and demonstrates:  07/23 23:10, SR, HR 73, subtle inferior ST elevation, hard to see due to wavy baseline w/ upright T waves, no LBBB w/ QRS duration 102 ms 07/23, 23:31 ECG is SR, inferior ST changes have resolved, lead III T wave is inverted 07/24 ECG is pending  05/05/2018 ECG is S brady, HR 55, QRS duration 132 ms w/ IVCD vs LBBB morphology, T waves inverted in lead III Telemetry:  Telemetry was personally reviewed and demonstrates:  SR  Relevant CV Studies:  STRESS ECHO: 01/28/2013 - Stress ECG conclusions: QRS electrical alternans occurred  during exercise, with shortening of PR interval and  possible prexcitation. Marked increase in baseline ST-T  abnormalities The stress ECG wasnon-diagnostic for  ischemia due to the presence of LBBB.  - Staged echo: There was echocardiographic evidence for  stress-induced ischemia.   CATH: 02/02/2013 Angiographic Findings: Left main: No obstructive disease.  Left Anterior Descending Artery: Large caliber vessel that courses to the apex. The entire proximal and mid vessel has severe calcification. There is diffuse 40% stenosis throughout the proximal and mid vessel but this does not appear to be flow limiting. The distal LAD becomes smaller in caliber and has diffuse 30% stenosis. There  are 3 very small caliber diagonal branches. The first two diagonal branches are 1.0 mm in diameter and each has 70-80% ostial stenosis (too small for PCI).  Circumflex Artery: Large caliber vessel with 20% proximal stenosis. The AV groove Circumflex leads into a moderate caliber left sided posterolateral branch with mild plaque. The first obtuse marginal  branch is very small in caliber (1. 0 mm) and has diffuse 90% proximal stenosis. The second obtuse marginal branch is very small in caliber and has severe distal stenosis. (too small for PCI). The third obtuse marginal branch is small to moderate in caliber and has proximal 50% stenosis, mod 40% stenosis.  Right Coronary Artery: Small caliber, non-dominant vessel with 90% proximal stenosis. This vessel appears to be 1.5-1.75 mm in caliber (too small for PCI)  Left Ventricular Angiogram: LVEF 40-45%, global hypokinesis.  Impression: 1. 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 The RCA is small and non-dominant. The branches with high grade disease are too small for PCI.  2. Mild to moderate LV systolic dysfunction  Recommendations: His major epicardial vessels are free of flow limiting disease. His angina is likely from the high grade disease in the very small caliber branch vessels. Will continue medical management. Will add Imdur 30 mg po QHS and Toprol 50 mg po Qdaily. He will need to stop using Levitra while he is on Imdur.   Laboratory Data:  Chemistry Recent Labs  Lab 04/22/19 2329 04/23/19 0120 04/23/19 0127 04/23/19 0810  NA 138 137 138 136  K 3.6 5.5* 5.4* 5.1  CL 120* 114*  --  113*  CO2 13* 17*  --  13*  GLUCOSE 77 112*  --  243*  BUN 24* 33*  --  35*  CREATININE 1.42* 2.34*  --  2.50*  CALCIUM 5.5* 7.6*  --  7.8*  GFRNONAA 52* 28*  --  26*  GFRAA >60 33*  --  30*  ANIONGAP 5 6  --  10    Lab Results  Component Value Date   ALT 10 04/23/2019   AST 26 04/23/2019   ALKPHOS 34 (L) 04/23/2019   BILITOT 0.4 04/23/2019   Hematology Recent Labs  Lab 04/22/19 2329 04/23/19 0127 04/23/19 0148  WBC 4.2  --  11.4*  RBC 2.31*  --  4.75  HGB 7.5* 15.3 15.2  HCT 24.4* 45.0 46.3  MCV 105.6*  --  97.5  MCH 32.5  --  32.0  MCHC 30.7  --  32.8  RDW 13.4  --  13.5  PLT 100*  --  226   Cardiac Enzymes  High Sensitivity Troponin:     Recent Labs  Lab 04/22/19 2329 04/23/19 0120 04/23/19 0546 04/23/19 0810  TROPONINIHS 2 145* 934* 1,275*      Magnesium:  Magnesium  Date Value Ref Range Status  04/23/2019 1.8 1.7 - 2.4 mg/dL Final    Comment:    Performed at [facility], [place] [address] [place], [place] [address]     Radiology/Studies:  Dg Chest Port 1 View  Result Date: 04/22/2019 CLINICAL DATA:  Chest pain EXAM: PORTABLE CHEST 1 VIEW COMPARISON:  01/29/2013 FINDINGS: The heart size and mediastinal contours are within normal limits. Both lungs are clear. The visualized skeletal structures are unremarkable. IMPRESSION: No active disease. Electronically Signed   By: Inez Catalina M.D.   On: 04/22/2019 23:42    Assessment and Plan:   1. NSTEMI - severe CP yesterday in the  setting of anaphylactic shock and low BP - may have occluded severely diseased but small RCA. - sx improved w/ SL NTG x 1 - no additional CP despite home rx held and pt on Epi gtt - on ASA, heparin, intol statins - no nitrates or BB or BP meds 2nd shock - would restart BB first and other meds gradually as pt improves  - will order echo - MD to review cath films and see if repeat cath would be helpful - otherwise, continue medical therapy - he is currently NPO  2. Hyperlipidemia, intol all statins - sent staff message to PharmD about [place] study - otherwise, will refer to lipid clinic to try to get Repatha at reasonable cost.  Otherwise, per CCM Active Problems:   Anaphylactic shock   For questions or updates, please contact [facility] Please consult [email or web address] for contact info under Cardiology/STEMI.   SignedRosaria Ferries, PA-C  04/23/2019 10:54 AM

## 2019-04-24 DIAGNOSIS — I25119 Atherosclerotic heart disease of native coronary artery with unspecified angina pectoris: Secondary | ICD-10-CM

## 2019-04-24 LAB — CBC
HCT: 38.6 % — ABNORMAL LOW (ref 39.0–52.0)
Hemoglobin: 12.8 g/dL — ABNORMAL LOW (ref 13.0–17.0)
MCH: 31.8 pg (ref 26.0–34.0)
MCHC: 33.2 g/dL (ref 30.0–36.0)
MCV: 96 fL (ref 80.0–100.0)
Platelets: 149 10*3/uL — ABNORMAL LOW (ref 150–400)
RBC: 4.02 MIL/uL — ABNORMAL LOW (ref 4.22–5.81)
RDW: 13.8 % (ref 11.5–15.5)
WBC: 7 10*3/uL (ref 4.0–10.5)
nRBC: 0 % (ref 0.0–0.2)

## 2019-04-24 LAB — BASIC METABOLIC PANEL
Anion gap: 5 (ref 5–15)
BUN: 31 mg/dL — ABNORMAL HIGH (ref 8–23)
CO2: 18 mmol/L — ABNORMAL LOW (ref 22–32)
Calcium: 8.4 mg/dL — ABNORMAL LOW (ref 8.9–10.3)
Chloride: 115 mmol/L — ABNORMAL HIGH (ref 98–111)
Creatinine, Ser: 1.67 mg/dL — ABNORMAL HIGH (ref 0.61–1.24)
GFR calc Af Amer: 49 mL/min — ABNORMAL LOW (ref >60)
GFR calc non Af Amer: 43 mL/min — ABNORMAL LOW (ref >60)
Glucose, Bld: 137 mg/dL — ABNORMAL HIGH (ref 70–99)
Potassium: 4.6 mmol/L (ref 3.5–5.1)
Sodium: 138 mmol/L (ref 135–145)

## 2019-04-24 LAB — GLUCOSE, CAPILLARY
Glucose-Capillary: 113 mg/dL — ABNORMAL HIGH (ref 70–99)
Glucose-Capillary: 119 mg/dL — ABNORMAL HIGH (ref 70–99)

## 2019-04-24 LAB — MAGNESIUM: Magnesium: 2 mg/dL (ref 1.7–2.4)

## 2019-04-24 LAB — HEPARIN LEVEL (UNFRACTIONATED)
Heparin Unfractionated: 0.78 IU/mL — ABNORMAL HIGH (ref 0.30–0.70)
Heparin Unfractionated: 0.6 IU/mL (ref 0.30–0.70)

## 2019-04-24 MED ORDER — DIPHENHYDRAMINE HCL 50 MG/ML IJ SOLN
25.0000 mg | Freq: Four times a day (QID) | INTRAMUSCULAR | Status: DC | PRN
  Administered 2019-04-24: 25 mg via INTRAVENOUS

## 2019-04-24 MED ORDER — FAMOTIDINE 20 MG PO TABS: 20.0000 mg | ORAL_TABLET | Freq: Two times a day (BID) | ORAL | 0 refills | Status: DC

## 2019-04-24 MED ORDER — EZETIMIBE 10 MG PO TABS: 10.0000 mg | ORAL_TABLET | Freq: Every day | ORAL | Status: DC

## 2019-04-24 MED ORDER — PREDNISONE 50 MG PO TABS: 60.0000 mg | ORAL_TABLET | Freq: Every day | ORAL | Status: DC

## 2019-04-24 MED ORDER — METOPROLOL SUCCINATE ER 100 MG PO TB24
100.0000 mg | ORAL_TABLET | Freq: Every day | ORAL | Status: DC
  Administered 2019-04-24: 100 mg via ORAL
  Filled 2019-04-24: qty 1

## 2019-04-24 MED ORDER — PREDNISONE 10 MG PO TABS: ORAL_TABLET | ORAL | 0 refills | Status: DC

## 2019-04-24 MED ORDER — FAMOTIDINE 20 MG PO TABS
20.0000 mg | ORAL_TABLET | Freq: Two times a day (BID) | ORAL | Status: DC
  Filled 2019-04-24 (×2): qty 1

## 2019-04-24 MED ORDER — EPINEPHRINE 0.3 MG/0.3ML IJ SOAJ: 0.3000 mg | INTRAMUSCULAR | 0 refills | Status: AC | PRN

## 2019-04-24 MED ORDER — ISOSORBIDE MONONITRATE ER 30 MG PO TB24
15.0000 mg | ORAL_TABLET | Freq: Every day | ORAL | Status: DC
  Administered 2019-04-24: 15 mg via ORAL
  Filled 2019-04-24: qty 1

## 2019-04-24 MED ORDER — EZETIMIBE 10 MG PO TABS: 10.0000 mg | ORAL_TABLET | Freq: Every day | ORAL | 0 refills | Status: DC

## 2019-04-24 NOTE — Progress Notes (Signed)
ANTICOAGULATION CONSULT NOTE - [place] for Heparin Indication: chest pain/ACS  Allergies  Allergen Reactions  . Latex Swelling    Patient Measurements: Height: 5\' 10"  (177.8 cm) Weight: 249 lb 1.9 oz (113 kg) IBW/kg (Calculated) : 73 Heparin Dosing Weight: 100kg  Vital Signs: Temp: 97.7 F (36.5 C) (07/25 0243) Temp Source: Oral (07/25 0243) BP: 134/79 (07/25 0243) Pulse Rate: 94 (07/25 0243)  Labs: Recent Labs    04/22/19 2329 04/23/19 0120 04/23/19 0127 04/23/19 0148 04/23/19 0546 04/23/19 0810 04/23/19 1356 04/23/19 2026 04/24/19 0247  HGB 7.5*  --  15.3 15.2  --   --   --   --  12.8*  HCT 24.4*  --  45.0 46.3  --   --   --   --  38.6*  PLT 100*  --   --  226  --   --   --   --  149*  HEPARINUNFRC  --   --   --   --   --   --  0.42 0.58 0.78*  CREATININE 1.42* 2.34*  --   --   --  2.50*  --   --  1.67*  TROPONINIHS 2 145*  --   --  934* 1,275*  --   --   --     Estimated Creatinine Clearance: 56.3 mL/min (A) (by C-G formula based on SCr of 1.67 mg/dL (H)).   Medical History: Past Medical History:  Diagnosis Date  . CAD (coronary artery disease)   . Erectile dysfunction   . Hematospermia   . Hyperlipidemia   . Hypertension 2014  . Overweight(278.02)   . 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 amLODipine (NORVASC) 5 MG tablet Take 5 mg by mouth daily.   04/22/2019 at Unknown time  . aspirin EC 81 MG tablet Take 81 mg by mouth daily.   04/22/2019 at Unknown time  . bromocriptine (PARLODEL) 2.5 MG tablet Take 1.25 mg by mouth daily.    04/22/2019 at Unknown time  . buPROPion (WELLBUTRIN XL) 150 MG 24 hr tablet Take 150 mg by mouth daily.   04/22/2019 at Unknown time  . famotidine (PEPCID) 20 MG tablet Take 20 mg by mouth daily.   04/22/2019 at Unknown time  . isosorbide mononitrate (IMDUR) 30 MG 24 hr  tablet Take 30 mg by mouth daily.   04/22/2019 at Unknown time  . lisinopril (PRINIVIL,ZESTRIL) 10 MG tablet Take 1 tablet (10 mg total) by mouth daily. 90 tablet 3 04/22/2019 at Unknown time  . LORazepam (ATIVAN) 1 MG tablet Take 1 mg by mouth every 8 (eight) hours.    04/22/2019 at Unknown time  . meloxicam (MOBIC) 15 MG tablet Take 15 mg by mouth daily.    04/22/2019 at Unknown time  . metoprolol succinate (TOPROL-XL) 100 MG 24 hr tablet Take 100 mg by mouth daily. Take with or immediately following a meal.   04/22/2019 at 0800  . NITROSTAT 0.4 MG SL tablet Place 0.4 mg under the tongue every 5 (five) minutes as needed for chest pain.    04/22/2019 at Unknown time  . ezetimibe (ZETIA) 10 MG tablet Take 1 tablet (10 mg total) by mouth daily. (Patient not taking: Reported on 04/23/2019) 30 tablet 6 Not Taking at Unknown time   Scheduled:  . aspirin EC  81 mg Oral Daily  .  bromocriptine  1.25 mg Oral Daily  . buPROPion  150 mg Oral Daily  . famotidine  20 mg Oral BID  . insulin aspart  0-9 Units Subcutaneous Q4H  . isosorbide mononitrate  15 mg Oral Daily  . LORazepam  1 mg Oral Q8H  . methylPREDNISolone (SOLU-MEDROL) injection  40 mg Intravenous Q6H  . metoprolol succinate  100 mg Oral Daily  . [START ON 04/25/2019] predniSONE  60 mg Oral Q breakfast   Infusions:  . heparin 1,300 Units/hr (04/24/19 0600)    Assessment: 63yo male admitted for allergic reaction and hypotension, reports CP associated with SOB though at rest unlike his usual angina which occurs with activity, troponin rising with EKG changes which may be d/t allergic reaction, to begin heparin pending further w/u.  Heparin level this morning is therapeutic (HL 0.6, goal of 0.3-0.7). Cardiology consulting and evaluating for need to take for cardiac cath. No bleeding noted at this time.   Goal of Therapy:  Heparin level 0.3-0.7 units/ml Monitor platelets by anticoagulation protocol: Yes   Plan:  - Continue Heparin drip at 1400  units/hr (14 ml/hr) - Will continue to monitor for any signs/symptoms of bleeding  - Daily heparin level and CBC  Thank you for allowing pharmacy to be a part of this patient's care.  Erin Hearing PharmD., BCPS Clinical Pharmacist 04/24/2019 9:28 AM

## 2019-04-24 NOTE — Plan of Care (Signed)
  Problem: Education: Goal: Knowledge of General Education information will improve Description: Including pain rating scale, medication(s)/side effects and non-pharmacologic comfort measures Outcome: Adequate for Discharge   Problem: Health Behavior/Discharge Planning: Goal: Ability to manage health-related needs will improve Outcome: Adequate for Discharge   Problem: Clinical Measurements: Goal: Ability to maintain clinical measurements within normal limits will improve Outcome: Adequate for Discharge Goal: Will remain free from infection Outcome: Adequate for Discharge Goal: Diagnostic test results will improve Outcome: Adequate for Discharge Goal: Respiratory complications will improve Outcome: Adequate for Discharge Goal: Cardiovascular complication will be avoided Outcome: Adequate for Discharge   Problem: Nutrition: Goal: Adequate nutrition will be maintained Outcome: Adequate for Discharge   Problem: Activity: Goal: Risk for activity intolerance will decrease Outcome: Adequate for Discharge   Problem: Coping: Goal: Level of anxiety will decrease Outcome: Adequate for Discharge   Problem: Elimination: Goal: Will not experience complications related to bowel motility Outcome: Adequate for Discharge Goal: Will not experience complications related to urinary retention Outcome: Adequate for Discharge   Problem: Pain Managment: Goal: General experience of comfort will improve Outcome: Adequate for Discharge   Problem: Safety: Goal: Ability to remain free from injury will improve Outcome: Adequate for Discharge   Problem: Skin Integrity: Goal: Risk for impaired skin integrity will decrease Outcome: Adequate for Discharge

## 2019-04-24 NOTE — Discharge Summary (Signed)
Physician Discharge Summary  Jesse Coleman  QPY:[id number]  DOB: 02/17/1956  DOA: 04/22/2019 PCP: Reynold Bowen, MD  Admit date: 04/22/2019 Discharge date: 04/24/2019  Admitted From: Home Disposition: Home  Recommendations for Outpatient Follow-up:  1. Follow up with PCP in 1-2 weeks 2. Please obtain BMP/CBC in one week to monitor renal function. 3. Follow-up with cardiology  Discharge Condition: None CODE STATUS: Full code Diet recommendation: Heart Healthy   Brief/Interim Summary: For full details see H&P/Progress note, but in brief, Jesse Coleman is a 63 year old male with past medical history of CAD, hypertension, pituitary adenoma, and hyper lipidemia who was brought in via ambulance to the emergency department after having episodes of dizziness and weakness after having a variety of tomatoes which were new to him.  He also was having chest pain and shortness of breath which prompted him to take a sublingual nitroglycerin.  Upon EMS arrival he was found to be hypotensive with systolic blood pressure to the 60s he was treated with IV fluids and epinephrine which improved his blood pressure however subsequently blood pressure started to decline and epinephrine infusion was started.  Patient was admitted to the ICU with working diagnosis of possible anaphylaxis shock due to tomatoes.  ED work-up showed creatinine of 2.34, potassium 5.5, troponin I 45, EKG with mild inferior segment ST elevation.  Cardiology was consulted who felt that this was due to demand ischemia and no further cardiology work-up was needed.  Patient was treated with Solu-Medrol, Pepcid and Benadryl IV, along with IV fluids.  He was weaned off epi infusion, taper off steroids will monitor.  Patient was transferred out of the ICU, renal function improved and cardiology recommended outpatient follow-up.  Patient was deemed stable for discharge home.  Subjective: Patient seen and examined, he reported having mild chest pain  this morning which he related to the stress of being here, and not having his medications.  Patient does not have any respiratory concerns.  He denies any shortness of breath, cough and rash.  His blood pressure is up.  Renal function improved.  Discharge Diagnoses/Hospital Course:  Active Problems:   Anaphylactic shock  Anaphylactic shock Felt to be secondary to tomatoes.  Will discharge on prednisone taper for 14 days.  Will prescribe EpiPen.  Recommended to avoid tomatoes and tomato based products. Continue OTC Benadryl as needed and Pepcid 20 mg twice daily..  Commended to follow-up with PCP within the next week.  Demand ischemia in setting of anaphylaxis and hypotension, leading to ST segment depression/CAD Cardiology was consulted felt to be secondary to anaphylaxis shock.  Echocardiogram showed normal LVEF at 60 to 65% without regional wall motion abnormality.  They recommended follow-up as an outpatient and continue home medication.  Will resume ASA, Imdur and Toprol XL.  Will hold on lisinopril due to AKI, until creatinine return back to baseline.  AKI Due to hypotensive episode, creatinine peaked at 2.34, creatinine upon discharge 1.67.  We will continue to hold lisinopril and meloxicam.  Avoid nephrotoxic agent.  Advised to stay well-hydrated.  He needs to follow-up with PCP for renal function check.  Hypertension Initially admitted for hypotension, blood pressure medications were hold during hospital stay.  At discharge blood pressure was normalized and Imdur and metoprolol were resumed.  Amlodipine still on hold, can resume if blood pressure start rising, otherwise await until seen by PCP.  Lisinopril will continue on hold until creatinine is back to baseline.    Hyperlipidemia Intolerant to statin, currently on Zetia.  Cardiology will check if patient can be placed on PCSK9 inhibitor as an outpatient.  Pituitary adenoma Stable on bromocriptine.  All other chronic medical  condition were stable during the hospitalization.  On the day of the discharge the patient's vitals were stable, and no other acute medical condition were reported by patient. the patient was felt safe to be discharge to home  Discharge Instructions  You were cared for by a hospitalist during your hospital stay. If you have any questions about your discharge medications or the care you received while you were in the hospital after you are discharged, you can call the unit and asked to speak with the hospitalist on call if the hospitalist that took care of you is not available. Once you are discharged, your primary care physician will handle any further medical issues. Please note that NO REFILLS for any discharge medications will be authorized once you are discharged, as it is imperative that you return to your primary care physician (or establish a relationship with a primary care physician if you do not have one) for your aftercare needs so that they can reassess your need for medications and monitor your lab values.  Discharge Instructions    Call MD for:  difficulty breathing, headache or visual disturbances   Complete by: As directed    Call MD for:  extreme fatigue   Complete by: As directed    Call MD for:  hives   Complete by: As directed    Call MD for:  persistant dizziness or light-headedness   Complete by: As directed    Call MD for:  persistant nausea and vomiting   Complete by: As directed    Call MD for:  redness, tenderness, or signs of infection (pain, swelling, redness, odor or green/yellow discharge around incision site)   Complete by: As directed    Call MD for:  severe uncontrolled pain   Complete by: As directed    Call MD for:  temperature >100.4   Complete by: As directed    Diet - low sodium heart healthy   Complete by: As directed    Increase activity slowly   Complete by: As directed      Allergies as of 04/24/2019      Reactions   Latex Swelling       Medication List    STOP taking these medications   amLODipine 5 MG tablet Commonly known as: NORVASC   lisinopril 10 MG tablet Commonly known as: ZESTRIL   meloxicam 15 MG tablet Commonly known as: MOBIC     TAKE these medications   aspirin EC 81 MG tablet Take 81 mg by mouth daily.   bromocriptine 2.5 MG tablet Commonly known as: PARLODEL Take 1.25 mg by mouth daily.   buPROPion 150 MG 24 hr tablet Commonly known as: WELLBUTRIN XL Take 150 mg by mouth daily.   EPINEPHrine 0.3 mg/0.3 mL Soaj injection Commonly known as: EPI-PEN Inject 0.3 mLs (0.3 mg total) into the muscle as needed for anaphylaxis.   ezetimibe 10 MG tablet Commonly known as: ZETIA Take 1 tablet (10 mg total) by mouth daily.   famotidine 20 MG tablet Commonly known as: PEPCID Take 1 tablet (20 mg total) by mouth 2 (two) times daily. What changed: when to take this   isosorbide mononitrate 30 MG 24 hr tablet Commonly known as: IMDUR Take 30 mg by mouth daily.   LORazepam 1 MG tablet Commonly known as: ATIVAN Take 1 mg by mouth  every 8 (eight) hours.   metoprolol succinate 100 MG 24 hr tablet Commonly known as: TOPROL-XL Take 100 mg by mouth daily. Take with or immediately following a meal.   Nitrostat 0.4 MG SL tablet Generic drug: nitroGLYCERIN Place 0.4 mg under the tongue every 5 (five) minutes as needed for chest pain.   predniSONE 10 MG tablet Commonly known as: DELTASONE Take 4 tablets for 3 days; Take 3 tablets for 4 days; Take 2 tablets for 3 days; Take 1 tablet for 4 days       Allergies  Allergen Reactions  . Latex Swelling    Consultations:  Cardiology   Procedures/Studies: Dg Chest Port 1 View  Result Date: 04/22/2019 CLINICAL DATA:  Chest pain EXAM: PORTABLE CHEST 1 VIEW COMPARISON:  01/29/2013 FINDINGS: The heart size and mediastinal contours are within normal limits. Both lungs are clear. The visualized skeletal structures are unremarkable. IMPRESSION: No  active disease. Electronically Signed   By: Inez Catalina M.D.   On: 04/22/2019 23:42   Echocardiogram done on 7/24 shows LVEF 60 to 65%.   Discharge Exam: Vitals:   04/24/19 0243 04/24/19 1015  BP: 134/79 (!) 149/74  Pulse: 94   Resp: (!) 24 (!) 22  Temp: 97.7 F (36.5 C)   SpO2: 97% 97%   Vitals:   04/23/19 1929 04/23/19 2333 04/24/19 0243 04/24/19 1015  BP: 130/76 131/83 134/79 (!) 149/74  Pulse: 91 88 94   Resp: 18 18 (!) 24 (!) 22  Temp: 98.2 F (36.8 C) 98.6 F (37 C) 97.7 F (36.5 C)   TempSrc: Oral Oral Oral   SpO2: 98% 98% 97% 97%  Weight:   113 kg   Height:        General: Pt is alert, awake, not in acute distress Cardiovascular: RRR, S1/S2 +, no rubs, no gallops Respiratory: CTA bilaterally, no wheezing, no rhonchi Abdominal: Soft, NT, ND, bowel sounds + Extremities: no edema, no cyanosis   The results of significant diagnostics from this hospitalization (including imaging, microbiology, ancillary and laboratory) are listed below for reference.     Microbiology: Recent Results (from the past 240 hour(s))  SARS Coronavirus 2 (CEPHEID - Performed in [place] hospital lab), Hosp Order     Status: None   Collection Time: 04/23/19  1:24 AM   Specimen: Nasopharyngeal Swab  Result Value Ref Range Status   SARS Coronavirus 2 NEGATIVE NEGATIVE Final    Comment: (NOTE) If result is NEGATIVE SARS-CoV-2 target nucleic acids are NOT DETECTED. The SARS-CoV-2 RNA is generally detectable in upper and lower  respiratory specimens during the acute phase of infection. The lowest  concentration of SARS-CoV-2 viral copies this assay can detect is 250  copies / mL. A negative result does not preclude SARS-CoV-2 infection  and should not be used as the sole basis for treatment or other  patient management decisions.  A negative result may occur with  improper specimen collection / handling, submission of specimen other  than nasopharyngeal swab, presence of viral  mutation(s) within the  areas targeted by this assay, and inadequate number of viral copies  (<250 copies / mL). A negative result must be combined with clinical  observations, patient history, and epidemiological information. If result is POSITIVE SARS-CoV-2 target nucleic acids are DETECTED. The SARS-CoV-2 RNA is generally detectable in upper and lower  respiratory specimens dur ing the acute phase of infection.  Positive  results are indicative of active infection with SARS-CoV-2.  Clinical  correlation with  patient history and other diagnostic information is  necessary to determine patient infection status.  Positive results do  not rule out bacterial infection or co-infection with other viruses. If result is PRESUMPTIVE POSTIVE SARS-CoV-2 nucleic acids MAY BE PRESENT.   A presumptive positive result was obtained on the submitted specimen  and confirmed on repeat testing.  While 2019 novel coronavirus  (SARS-CoV-2) nucleic acids may be present in the submitted sample  additional confirmatory testing may be necessary for epidemiological  and / or clinical management purposes  to differentiate between  SARS-CoV-2 and other Sarbecovirus currently known to infect humans.  If clinically indicated additional testing with an alternate test  methodology [phone]) is advised. The SARS-CoV-2 RNA is generally  detectable in upper and lower respiratory sp ecimens during the acute  phase of infection. The expected result is Negative. Fact Sheet for Patients:  StrictlyIdeas.no Fact Sheet for Healthcare Providers: BankingDealers.co.za This test is not yet approved or cleared by the [place] FDA and has been authorized for detection and/or diagnosis of SARS-CoV-2 by FDA under an Emergency Use Authorization (EUA).  This EUA will remain in effect (meaning this test can be used) for the duration of the COVID-19 declaration under Section 564(b)(1)  of the Act, 21 U.S.C. section 360bbb-3(b)(1), unless the authorization is terminated or revoked sooner. Performed at [facility], [place] [address], [place], [place] [address]   MRSA PCR Screening     Status: None   Collection Time: 04/23/19  6:33 AM   Specimen: Nasal Mucosa; Nasopharyngeal  Result Value Ref Range Status   MRSA by PCR NEGATIVE NEGATIVE Final    Comment:        The GeneXpert MRSA Assay (FDA approved for NASAL specimens only), is one component of a comprehensive MRSA colonization surveillance program. It is not intended to diagnose MRSA infection nor to guide or monitor treatment for MRSA infections. Performed at [facility], [place] [address], [place], [place] [address]      Labs: BNP (last 3 results) No results for input(s): BNP in the last 8760 hours. Basic Metabolic Panel: Recent Labs  Lab 04/22/19 2329 04/23/19 0120 04/23/19 0127 04/23/19 0810 04/24/19 0247  NA 138 137 138 136 138  K 3.6 5.5* 5.4* 5.1 4.6  CL 120* 114*  --  113* 115*  CO2 13* 17*  --  13* 18*  GLUCOSE 77 112*  --  243* 137*  BUN 24* 33*  --  35* 31*  CREATININE 1.42* 2.34*  --  2.50* 1.67*  CALCIUM 5.5* 7.6*  --  7.8* 8.4*  MG  --  1.5*  --  1.8 2.0   Liver Function Tests: Recent Labs  Lab 04/22/19 2329 04/23/19 0120 04/23/19 0810  AST 13* 23 26  ALT 7 9 10   ALKPHOS 25* 33* 34*  BILITOT 0.8 0.9 0.4  PROT 3.1* 4.4* 4.8*  ALBUMIN 1.9* 2.8* 3.0*   No results for input(s): LIPASE, AMYLASE in the last 168 hours. No results for input(s): AMMONIA in the last 168 hours. CBC: Recent Labs  Lab 04/22/19 2329 04/23/19 0127 04/23/19 0148 04/24/19 0247  WBC 4.2  --  11.4* 7.0  HGB 7.5* 15.3 15.2 12.8*  HCT 24.4* 45.0 46.3 38.6*  MCV 105.6*  --  97.5 96.0  PLT 100*  --  226 149*   Cardiac Enzymes: No results for input(s): CKTOTAL, CKMB, CKMBINDEX, TROPONINI in the last 168 hours. BNP: Invalid input(s): POCBNP CBG: Recent Labs  Lab 04/23/19  1609  04/23/19 2003 04/23/19 2334 04/24/19 0304 04/24/19 0812  GLUCAP 123* 131* 119* 113* 119*   D-Dimer No results for input(s): DDIMER in the last 72 hours. Hgb A1c Recent Labs    04/23/19 1100  HGBA1C 5.4   Lipid Profile No results for input(s): CHOL, HDL, LDLCALC, TRIG, CHOLHDL, LDLDIRECT in the last 72 hours. Thyroid function studies No results for input(s): TSH, T4TOTAL, T3FREE, THYROIDAB in the last 72 hours.  Invalid input(s): FREET3 Anemia work up No results for input(s): VITAMINB12, FOLATE, FERRITIN, TIBC, IRON, RETICCTPCT in the last 72 hours. Urinalysis No results found for: COLORURINE, APPEARANCEUR, [place], [place], GLUCOSEU, [place], [place], KETONESUR, PROTEINUR, UROBILINOGEN, NITRITE, LEUKOCYTESUR Sepsis Labs Invalid input(s): PROCALCITONIN,  WBC,  LACTICIDVEN Microbiology Recent Results (from the past 240 hour(s))  SARS Coronavirus 2 (CEPHEID - Performed in [place] hospital lab), Hosp Order     Status: None   Collection Time: 04/23/19  1:24 AM   Specimen: Nasopharyngeal Swab  Result Value Ref Range Status   SARS Coronavirus 2 NEGATIVE NEGATIVE Final    Comment: (NOTE) If result is NEGATIVE SARS-CoV-2 target nucleic acids are NOT DETECTED. The SARS-CoV-2 RNA is generally detectable in upper and lower  respiratory specimens during the acute phase of infection. The lowest  concentration of SARS-CoV-2 viral copies this assay can detect is 250  copies / mL. A negative result does not preclude SARS-CoV-2 infection  and should not be used as the sole basis for treatment or other  patient management decisions.  A negative result may occur with  improper specimen collection / handling, submission of specimen other  than nasopharyngeal swab, presence of viral mutation(s) within the  areas targeted by this assay, and inadequate number of viral copies  (<250 copies / mL). A negative result must be combined with clinical  observations, patient history, and  epidemiological information. If result is POSITIVE SARS-CoV-2 target nucleic acids are DETECTED. The SARS-CoV-2 RNA is generally detectable in upper and lower  respiratory specimens dur ing the acute phase of infection.  Positive  results are indicative of active infection with SARS-CoV-2.  Clinical  correlation with patient history and other diagnostic information is  necessary to determine patient infection status.  Positive results do  not rule out bacterial infection or co-infection with other viruses. If result is PRESUMPTIVE POSTIVE SARS-CoV-2 nucleic acids MAY BE PRESENT.   A presumptive positive result was obtained on the submitted specimen  and confirmed on repeat testing.  While 2019 novel coronavirus  (SARS-CoV-2) nucleic acids may be present in the submitted sample  additional confirmatory testing may be necessary for epidemiological  and / or clinical management purposes  to differentiate between  SARS-CoV-2 and other Sarbecovirus currently known to infect humans.  If clinically indicated additional testing with an alternate test  methodology [phone]) is advised. The SARS-CoV-2 RNA is generally  detectable in upper and lower respiratory sp ecimens during the acute  phase of infection. The expected result is Negative. Fact Sheet for Patients:  StrictlyIdeas.no Fact Sheet for Healthcare Providers: BankingDealers.co.za This test is not yet approved or cleared by the [place] FDA and has been authorized for detection and/or diagnosis of SARS-CoV-2 by FDA under an Emergency Use Authorization (EUA).  This EUA will remain in effect (meaning this test can be used) for the duration of the COVID-19 declaration under Section 564(b)(1) of the Act, 21 U.S.C. section 360bbb-3(b)(1), unless the authorization is terminated or revoked sooner. Performed at [facility], [place]  [address], [place], [place] [address]   MRSA PCR  Screening     Status: None   Collection Time: 04/23/19  6:33 AM   Specimen: Nasal Mucosa; Nasopharyngeal  Result Value Ref Range Status   MRSA by PCR NEGATIVE NEGATIVE Final    Comment:        The GeneXpert MRSA Assay (FDA approved for NASAL specimens only), is one component of a comprehensive MRSA colonization surveillance program. It is not intended to diagnose MRSA infection nor to guide or monitor treatment for MRSA infections. Performed at [facility], [place] [address], [place], [place] [address]      Time coordinating discharge:  30 minutes  SIGNED:  Chipper Oman, MD  [facility] 04/24/2019, 3:15 PM  Pager please text page via  [email or web address]  Note - This record has been created using [employer]. Chart creation errors have been sought, but may not always have been located. Such creation errors do not reflect on the standard of medical care.

## 2019-04-24 NOTE — Progress Notes (Signed)
Pt d/c home, discharge instructions given, all questions answered to pt satisfaction.

## 2019-04-24 NOTE — Progress Notes (Signed)
Progress Note  Patient Name: Jesse Coleman Date of Encounter: 04/24/2019  Primary Cardiologist: Buford Dresser, MD  Subjective   No chest pain or shortness of breath, no pruritus or nausea.  Has not ambulated yet.  Inpatient Medications    Scheduled Meds: . aspirin EC  81 mg Oral Daily  . bromocriptine  1.25 mg Oral Daily  . buPROPion  150 mg Oral Daily  . famotidine  20 mg Oral BID  . insulin aspart  0-9 Units Subcutaneous Q4H  . isosorbide mononitrate  15 mg Oral Daily  . LORazepam  1 mg Oral Q8H  . methylPREDNISolone (SOLU-MEDROL) injection  40 mg Intravenous Q6H  . metoprolol succinate  100 mg Oral Daily  . [START ON 04/25/2019] predniSONE  60 mg Oral Q breakfast    PRN Meds: diphenhydrAMINE, morphine injection, nitroGLYCERIN   Vital Signs    Vitals:   04/23/19 1850 04/23/19 1929 04/23/19 2333 04/24/19 0243  BP: (!) 141/81 130/76 131/83 134/79  Pulse: 94 91 88 94  Resp: '13 18 18 ' (!) 24  Temp:  98.2 F (36.8 C) 98.6 F (37 C) 97.7 F (36.5 C)  TempSrc:  Oral Oral Oral  SpO2: 96% 98% 98% 97%  Weight:    113 kg  Height:        Intake/Output Summary (Last 24 hours) at 04/24/2019 1058 Last data filed at 04/24/2019 0600 Gross per 24 hour  Intake 2358.81 ml  Output 1000 ml  Net 1358.81 ml   Last 3 Weights 04/24/2019 04/23/2019 04/22/2019  Weight (lbs) 249 lb 1.9 oz 249 lb 9 oz 242 lb  Weight (kg) 113 kg 113.2 kg 109.77 kg      Telemetry    Sinus rhythm. Personally Reviewed.  ECG    Tracing from 04/22/2019 shows sinus rhythm with possible old infarct pattern.  Personally Reviewed.  Physical Exam   GEN: No acute distress.   Neck: No JVD Cardiac: RRR, no murmurs or gallops.  Respiratory: Clear to auscultation bilaterally. GI: Soft, nontender, non-distended  MS: No edema; No deformity. Neuro:  Nonfocal  Psych: Normal affect   Labs    High Sensitivity Troponin:   Recent Labs  Lab 04/22/19 2329 04/23/19 0120 04/23/19 0546 04/23/19 0810   TROPONINIHS 2 145* 934* 1,275*      Chemistry Recent Labs  Lab 04/22/19 2329 04/23/19 0120 04/23/19 0127 04/23/19 0810 04/24/19 0247  NA 138 137 138 136 138  K 3.6 5.5* 5.4* 5.1 4.6  CL 120* 114*  --  113* 115*  CO2 13* 17*  --  13* 18*  GLUCOSE 77 112*  --  243* 137*  BUN 24* 33*  --  35* 31*  CREATININE 1.42* 2.34*  --  2.50* 1.67*  CALCIUM 5.5* 7.6*  --  7.8* 8.4*  PROT 3.1* 4.4*  --  4.8*  --   ALBUMIN 1.9* 2.8*  --  3.0*  --   AST 13* 23  --  26  --   ALT 7 9  --  10  --   ALKPHOS 25* 33*  --  34*  --   BILITOT 0.8 0.9  --  0.4  --   GFRNONAA 52* 28*  --  26* 43*  GFRAA >60 33*  --  30* 49*  ANIONGAP 5 6  --  10 5     Hematology Recent Labs  Lab 04/22/19 2329 04/23/19 0127 04/23/19 0148 04/24/19 0247  WBC 4.2  --  11.4* 7.0  RBC 2.31*  --  4.75 4.02*  HGB 7.5* 15.3 15.2 12.8*  HCT 24.4* 45.0 46.3 38.6*  MCV 105.6*  --  97.5 96.0  MCH 32.5  --  32.0 31.8  MCHC 30.7  --  32.8 33.2  RDW 13.4  --  13.5 13.8  PLT 100*  --  226 149*    Radiology    Dg Chest Port 1 View  Result Date: 04/22/2019 CLINICAL DATA:  Chest pain EXAM: PORTABLE CHEST 1 VIEW COMPARISON:  01/29/2013 FINDINGS: The heart size and mediastinal contours are within normal limits. Both lungs are clear. The visualized skeletal structures are unremarkable. IMPRESSION: No active disease. Electronically Signed   By: Inez Catalina M.D.   On: 04/22/2019 23:42    Cardiac Studies   Echocardiogram 04/23/2019: 1. The left ventricle has normal systolic function with an ejection fraction of 60-65%. The cavity size was normal. There is moderate concentric left ventricular hypertrophy. Left ventricular diastolic Doppler parameters are consistent with impaired  relaxation.  2. The right ventricle has normal systolic function. The cavity was normal. There is no increase in right ventricular wall thickness. Right ventricular systolic pressure is normal with an estimated pressure of 30.9 mmHg.  3. Left atrial  size was moderately dilated.  4. The aortic valve is tricuspid. Mild thickening of the aortic valve. Mild calcification of the aortic valve. No stenosis of the aortic valve.  5. The aorta is normal in size and structure.  Patient Profile     63 y.o. male with a history of previously documented cardiomyopathy, left bundle branch block, hypertension, hyperlipidemia with statin intolerance, and CAD with most significant stenoses involving small branch vessels and a small nondominant RCA that has been managed medically.  He presents with anaphylactic reaction requiring epinephrine due to severe hypotension, possibly related to tomatoes.  In the setting he has manifested evidence of demand ischemia and ECG abnormalities although echocardiography shows normal LVEF and he is currently symptomatically stable.  Baseline medical therapy was reintroduced today.  Assessment & Plan    1.  Demand ischemia in the setting of anaphylaxis and hypotension, high-sensitivity troponin I up to 1275 (think 1.2 by prior troponin scale).  He does not report any active chest pain at this time and was started back on his baseline cardiac regimen today including ASA, Imdur, and Toprol-XL.  Follow-up echocardiogram shows normal LVEF at 60-65% without regional wall motion abnormalities.  2.  Hyperlipidemia with statin intolerance.  He has been on Zetia as an outpatient and also had follow-up in the lipid clinic last year.  May be a candidate for PCSK 9 inhibitor if adequately covered by insurance at this point.  3.  CAD by previous cardiac catheterization in 2014.  At that time he had obstructive stenoses in branch vessels including diagonal distribution, obtuse marginal, also a small nondominant RCA.  He has been managed medically.  4.  Status post anaphylactic reaction with shock, possibly secondary to tomatoes.  He has been off epinephrine and otherwise treated with Benadryl, Solu-Medrol, and H2 blockers.  He is symptomatically  stable at this time.  Discussed with patient and also via Facetime with his wife at home.  Continue aspirin, Imdur, Toprol-XL, add back Zetia as well.  Depending on blood pressure would add back Norvasc next.  He has had transient worsening in renal insufficiency with creatinine up to 2.5, down to 1.67 today.  Hold off on reintroducing lisinopril until back to baseline.  We will get a follow-up ECG today and he should  ambulate in the hall.  No further cardiac testing is anticipated at this time in the absence of worsening angina symptoms in normal physiologic state.  He states that he would like to go home later this afternoon, if that is the case he will need close outpatient follow-up and should have an EpiPen at discharge.  Treatment regimen taper for recent allergic reaction to be determined by primary team.  He would like to see Dr. Harrell Gave for cardiology follow-up having met her yesterday and this should be arranged within the next few weeks.   For questions or updates, please contact [place] Please consult [email or web address] for contact info under     Signed, Rozann Lesches, MD  04/24/2019, 10:58 AM

## 2019-04-24 NOTE — Progress Notes (Signed)
ANTICOAGULATION CONSULT NOTE - Follow Up Consult  Pharmacy Consult for heparin Indication: chest pain/ACS  Labs: Recent Labs    04/22/19 2329 04/23/19 0120 04/23/19 0127 04/23/19 0148 04/23/19 0546 04/23/19 0810 04/23/19 1356 04/23/19 2026 04/24/19 0247  HGB 7.5*  --  15.3 15.2  --   --   --   --  12.8*  HCT 24.4*  --  45.0 46.3  --   --   --   --  38.6*  PLT 100*  --   --  226  --   --   --   --  149*  HEPARINUNFRC  --   --   --   --   --   --  0.42 0.58 0.78*  CREATININE 1.42* 2.34*  --   --   --  2.50*  --   --  1.67*  TROPONINIHS 2 145*  --   --  934* 1,275*  --   --   --     Assessment: 63yo male supratherapeutic on heparin after two levels at goal though h had been trending up; no gtt issues or signs of bleeding per RN.  Goal of Therapy:  Heparin level 0.3-0.7 units/ml   Plan:  Will decrease heparin gtt by 1 unit/kg/hr to 1300 units/hr and check level in 6 hours.    Wynona Neat, PharmD, BCPS  04/24/2019,3:47 AM

## 2019-05-21 ENCOUNTER — Encounter: Payer: Self-pay | Admitting: Cardiology

## 2019-05-21 ENCOUNTER — Other Ambulatory Visit: Payer: Self-pay

## 2019-05-21 ENCOUNTER — Ambulatory Visit (INDEPENDENT_AMBULATORY_CARE_PROVIDER_SITE_OTHER): Payer: Medicare Other | Admitting: Cardiology

## 2019-05-21 VITALS — BP 161/93 | HR 63 | Ht 70.0 in | Wt 231.0 lb

## 2019-05-21 DIAGNOSIS — R0602 Shortness of breath: Secondary | ICD-10-CM | POA: Diagnosis not present

## 2019-05-21 DIAGNOSIS — I255 Ischemic cardiomyopathy: Secondary | ICD-10-CM

## 2019-05-21 DIAGNOSIS — I2510 Atherosclerotic heart disease of native coronary artery without angina pectoris: Secondary | ICD-10-CM | POA: Diagnosis not present

## 2019-05-21 DIAGNOSIS — Z7189 Other specified counseling: Secondary | ICD-10-CM

## 2019-05-21 DIAGNOSIS — E785 Hyperlipidemia, unspecified: Secondary | ICD-10-CM

## 2019-05-21 DIAGNOSIS — I119 Hypertensive heart disease without heart failure: Secondary | ICD-10-CM

## 2019-05-21 DIAGNOSIS — I10 Essential (primary) hypertension: Secondary | ICD-10-CM

## 2019-05-21 DIAGNOSIS — I5189 Other ill-defined heart diseases: Secondary | ICD-10-CM

## 2019-05-21 NOTE — Progress Notes (Signed)
Cardiology Office Note:    Date:  05/21/2019   ID:  Jesse Coleman, DOB April 19, 1956, MRN [id number]  PCP:  Reynold Bowen, MD  Cardiologist:  Buford Dresser, MD PhD  Referring MD: Reynold Bowen, MD   CC: Post hospitalization follow up, shortness of breath  History of Present Illness:    Jesse Coleman is a 63 y.o. male with a hx of LBBB, CAD (2014), HTN, obesity who is seen for post hospital follow up. I met him during a recent hospitalization for anaphylaxis, for which he was discharged on 04/24/2019. Notes reviewed. He had demand ischemia during this event 2/2 anaphylaxis.  Cardiac history, obtained from the patient and Care Everywhere: -History of chest pain starting 06/2012, progressive. Symptoms were burning chest pain that spread to shoulders, arms, and back. Worse with exertion, better with rest. Associated with shortness of breath and diaphoresis -Echo stress test 01/2013 showed LBBB, EF 40-45% with global hypokinesis.  -Cath 01/2013 showed small caliber CAD in nondominant RCA, diagonal, and OM not amenable to PCI.  -treated with medical therapy (metoprolol, aspirin, atorvastatin, ranolazine). Declined imdur due to interaction with PDE5 inhibitors. Had SL NG, felt terrible when he took this. -amlodipine added by Dr. Baron Hamper at [facility], also recommended medical management. Amlodipine stopped due to LE edema. Metoprolol succinate uptitrated.  -was on lisinopril and metoprolol succinate for systolic dysfunction. No repeat echo available.  Patient concerns today: Shortness of breath: feels like he can't walk more than 30 yards without shortness of breath. Feels like his airways are "fuzzy", more severe since. Used to be able to walk more than a mile easily. No chest tightness during th.ese events. Doesn't think he has had an echo since 2014 at his initial stress echo  BP: has been tracking at home. Wife is tracking with cuff (not machine), 120/70-80. Reviewed best ways to check  BP and he is following this. Hrs in the 60s as well. Has a history of white coat hypertension.   Lipids: He is not sure why he stopped zetia, does not recall any reaction to it. Multiple statin intolerances in the past. Discussed that guidelines suggest high intensity statin with LDL <70. Per review of records: Atorvastatin--myalgias Rosuvastatin 2018 ([facility])--myalgias  Discussed PCSK9 inhibitors and bemepdoic acid today. He is nervous about an injection that he can't "take back" if he has a reaction. He went through a prior authorization for repatha in 2018 but it was going to be very expensive.   CAD/angina: Was on ranexa in the past, not sure why he stopped it. Was on metoprolol and amlodipine for anti anginal medication prior to his anaphylaxis. Still on imdur. Has not had any chest pain recently. Reviewed today contraindication for meloxicam in known CAD.  CV risk: Family history (father with MI late 109s), HTN, HLD, age, gender, obesity. Never smoker, no diabetes. No CKD, had AKI during anaphylaxis admission.  Denies chest pain, shortness of breath at rest. No PND, orthopnea, LE edema or unexpected weight gain. No syncope or palpitations.  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   bromocriptine (PARLODEL) 2.5 MG tablet Take 1.25 mg by mouth daily.    buPROPion (WELLBUTRIN XL) 150 MG 24 hr tablet Take 150 mg by mouth daily.   EPINEPHrine 0.3 mg/0.3 mL IJ SOAJ injection Inject 0.3 mLs (0.3 mg total) into the muscle as needed for anaphylaxis.   famotidine (PEPCID) 20 MG tablet Take 1 tablet (20 mg total) by mouth 2 (two) times daily.   isosorbide mononitrate (IMDUR) 30 MG 24 hr tablet Take 30 mg by mouth daily.   LORazepam (ATIVAN) 1 MG tablet Take 1 mg by mouth every 8 (eight) hours.    metoprolol succinate (TOPROL-XL) 100 MG 24 hr tablet Take 100 mg by mouth daily. Take with or immediately following a meal.   NITROSTAT 0.4 MG SL tablet Place 0.4 mg under the tongue every 5 (five) minutes as needed for chest pain.    No current facility-administered medications on file prior to visit.      Allergies:   Latex   Social History   Socioeconomic History   Marital status: Married    Spouse name: Not on file   Number of children: 2   Years of education: Not on file   Highest education level: Not on file  Occupational History   Occupation: Real estate Scientist, clinical (histocompatibility and immunogenetics): [place] resource strain: Not on file   Food insecurity    Worry: Not on file    Inability: Not on file   Transportation needs    Medical: Not on file    Non-medical: Not on file  Tobacco Use   Smoking status: Never Smoker   Smokeless tobacco: Never Used  Substance and Sexual Activity   Alcohol use: Yes    Alcohol/week: 1.0 standard drinks    Types: 1 drink(s) per week   Drug use: No   Sexual activity: Not on file  Lifestyle   Physical activity    Days per week: Not on file    Minutes per session: Not on file   Stress: Not on file  Relationships   Social connections    Talks on phone: Not on file    Gets together: Not on file    Attends religious service: Not on file     Active member of club or organization: Not on file    Attends meetings of clubs or organizations: Not on file    Relationship status: Not on file  Other Topics Concern   Not on file  Social History Narrative   Not on file     Family History: The patient's family history includes Coronary artery disease (age of onset: 55) in his father; Emphysema in his mother.  ROS:   Please see the history of present illness.  Additional pertinent ROS: Constitutional: Negative for chills, fever, night sweats, unintentional weight loss  HENT: Negative for ear pain and hearing loss.   Eyes: Negative for loss of vision and eye pain.  Respiratory: Negative for cough, sputum, wheezing. Positive for dyspnea on  exertion.   Cardiovascular: See HPI. Gastrointestinal: Negative for abdominal pain, melena, and hematochezia.  Genitourinary: Negative for dysuria and hematuria.  Musculoskeletal: Negative for falls and myalgias.  Skin: Negative for itching and rash since recent anaphylaxis. Neurological: Negative for focal weakness, focal sensory changes and loss of consciousness.  Endo/Heme/Allergies: Does not bruise/bleed easily.     EKGs/Labs/Other Studies Reviewed:    The following studies were reviewed today: Prior notes and studies in care everywhere  EKG:  EKG is personally reviewed.  The ekg ordered 04/24/2019 demonstrates NSR, LBBB  Recent Labs: 04/23/2019: ALT 10 04/24/2019: BUN 31; Creatinine, Ser 1.67; Hemoglobin 12.8; Magnesium 2.0; Platelets 149; Potassium 4.6; Sodium 138  Recent Lipid Panel No results found for: CHOL, TRIG, HDL, CHOLHDL, VLDL, LDLCALC, LDLDIRECT  Physical Exam:    VS:  BP (!) 161/93    Pulse 63    Ht '5\' 10"'  (1.778 m)    Wt 231 lb (104.8 kg)    SpO2 97%    BMI 33.15 kg/m     Wt Readings from Last 3 Encounters:  05/21/19 231 lb (104.8 kg)  04/24/19 249 lb 1.9 oz (113 kg)  05/05/18 240 lb 3.2 oz (109 kg)     GEN: Well nourished, well developed in no acute  distress HEENT: Normal, moist mucous membranes NECK: No JVD CARDIAC: regular rhythm, normal S1 and S2, no murmurs, rubs, gallops.  VASCULAR: Radial and DP pulses 2+ bilaterally. No carotid bruits RESPIRATORY:  Clear to auscultation without rales, wheezing or rhonchi  ABDOMEN: Soft, non-tender, non-distended MUSCULOSKELETAL:  Ambulates independently SKIN: Warm and dry, no edema NEUROLOGIC:  Alert and oriented x 3. No focal neuro deficits noted. PSYCHIATRIC:  Normal affect    ASSESSMENT:    1. SOB (shortness of breath)   2. Hyperlipidemia LDL goal <70   3. Essential hypertension   4. Coronary artery disease involving native coronary artery of native heart without angina pectoris   5. Cardiac risk counseling   6. Counseling on health promotion and disease prevention   7. Ischemic cardiomyopathy   8. 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    PLAN:   Shortness of breath: normal exam today. Primarily dyspnea on exertion, though he does report a "fuzzy" feeling in his breathing at baseline. -appears euvolemic on exam today -given progressive symptoms, reported prior systolic dysfunction (EF 33-82%), will recheck echo -has known distal CAD not amenable to PCI. Could consider lexiscan (given LBBB) to determine extent of ischemia -has appt with his PCP next week, had labs drawn today. Plans to discuss PFTs with PCP -question whether this is residual recovery from his anaphylaxis, though he did not have significant respiratory symptoms at the time  CAD, with history of refractory angina: now without angina -continue imdur -continue metoprolol succinate -amlodipine stopped while admitted. Currently chest pain free, but consider restarting if angina recurs -on ranexa in the past, unclear why he stopped -counseled that NSAIDs are contraindicated in CAD, including meloxicam  Ischemic cardiomyopathy with history of systolic dysfunction (EF 50-53%) -NYHA class II-III today -appears  euvolemic on exam -continue metoprolol succinate -was on lisinopril prior to hospital admission. Held due to AKI from anaphylaxis. Had labs drawn for PCP today, follow. If kidney function normalized, restarted lisinopril vs. Change to ARB -follow echo results as above  Hypertension: reports good control at home, history of white coat hypertension. Given his concomitant CV disease, would be helpful to have actual log (does not use machine) or possibly ambulatory BP monitoring. -elevated BP here  but reports good control at home -anticipate restarting ACEi/ARB after PCP labs sent, which will help with BP. Will need to monitor kidney function after  Hyperlipidemia: goal LDL <70. On no meds. Had lipids drawn for PCP today, he will send me results -intolerant of atorvastatin and rosuvastatin in the past (myalgia) -he is not sure why he stopped ezetimibe -nervous re: PCSK9 inhibitors, both cost and side effects -discussed nexletol pros/cons. He will consider. Can refer to PharmD clinic -Wishes to discuss lipid options at our follow up visit -if TG >150, candidate for vascepa  Cardiac risk counseling and prevention recommendations: -recommend heart healthy/Mediterranean diet, with whole grains, fruits, vegetable, fish, lean meats, nuts, and olive oil. Limit salt. -recommend moderate walking, 3-5 times/week for 30-50 minutes each session. Aim for at least 150 minutes.week. Goal should be pace of 3 miles/hours, or walking 1.5 miles in 30 minutes -recommend avoidance of tobacco products. Avoid excess alcohol.  Plan for follow up: 6 weeks  TIME SPENT WITH PATIENT: 40 minutes of direct patient care. More than 50% of that time was spent on coordination of care and counseling regarding history, symptoms, risk factor management, medication options.  Buford Dresser, MD, PhD [place]   [facility]   Medication Adjustments/Labs and Tests Ordered: Current medicines are reviewed at length with  the patient today.  Concerns regarding medicines are outlined above.  Orders Placed This Encounter  Procedures   ECHOCARDIOGRAM COMPLETE   No orders of the defined types were placed in this encounter.   Patient Instructions  Medication Instructions:  Your Physician recommend you continue on your current medication as directed.    If you need a refill on your cardiac medications before your next appointment, please call your pharmacy.   Lab work: None  Testing/Procedures: Your physician has requested that you have an echocardiogram. Echocardiography is a painless test that uses sound waves to create images of your heart. It provides your doctor with information about the size and shape of your heart and how well your hearts chambers and valves are working. This procedure takes approximately one hour. There are no restrictions for this procedure. [place] [address]   Follow-Up: At [facility], you and your health needs are our priority.  As part of our continuing mission to provide you with exceptional heart care, we have created designated Provider Care Teams.  These Care Teams include your primary Cardiologist (physician) and Advanced Practice Providers (APPs -  Physician Assistants and Nurse Practitioners) who all work together to provide you with the care you need, when you need it. You will need a follow up appointment in 6 weeks.  Please call our office 2 months in advance to schedule this appointment.  You may see Buford Dresser, MD or one of the following Advanced Practice Providers on your designated Care Team:   Rosaria Ferries, PA-C  Jory Sims, DNP, ANP  Nexletol (bempedoic acid) as pill for cholesterol, versus PCSK9 inhibitors.    Signed, Buford Dresser, MD PhD 05/21/2019 5:33 PM    [facility]

## 2019-05-21 NOTE — Patient Instructions (Addendum)
Medication Instructions:  Your Physician recommend you continue on your current medication as directed.    If you need a refill on your cardiac medications before your next appointment, please call your pharmacy.   Lab work: None  Testing/Procedures: Your physician has requested that you have an echocardiogram. Echocardiography is a painless test that uses sound waves to create images of your heart. It provides your doctor with information about the size and shape of your heart and how well your heart's chambers and valves are working. This procedure takes approximately one hour. There are no restrictions for this procedure. [place] [address]   Follow-Up: At [facility], you and your health needs are our priority.  As part of our continuing mission to provide you with exceptional heart care, we have created designated Provider Care Teams.  These Care Teams include your primary Cardiologist (physician) and Advanced Practice Providers (APPs -  Physician Assistants and Nurse Practitioners) who all work together to provide you with the care you need, when you need it. You will need a follow up appointment in 6 weeks.  Please call our office 2 months in advance to schedule this appointment.  You may see Buford Dresser, MD or one of the following Advanced Practice Providers on your designated Care Team:   Rosaria Ferries, PA-C . Jory Sims, DNP, ANP  Nexletol (bempedoic acid) as pill for cholesterol, versus PCSK9 inhibitors.

## 2019-05-26 ENCOUNTER — Other Ambulatory Visit: Payer: Self-pay

## 2019-05-26 ENCOUNTER — Ambulatory Visit (HOSPITAL_COMMUNITY): Payer: Medicare Other | Attending: Cardiovascular Disease

## 2019-05-26 DIAGNOSIS — R0602 Shortness of breath: Secondary | ICD-10-CM | POA: Insufficient documentation

## 2019-05-27 ENCOUNTER — Other Ambulatory Visit (HOSPITAL_COMMUNITY): Payer: Medicare Other

## 2019-06-09 ENCOUNTER — Ambulatory Visit (INDEPENDENT_AMBULATORY_CARE_PROVIDER_SITE_OTHER): Payer: Medicare Other | Admitting: Pulmonary Disease

## 2019-06-09 ENCOUNTER — Encounter: Payer: Self-pay | Admitting: Pulmonary Disease

## 2019-06-09 ENCOUNTER — Other Ambulatory Visit: Payer: Self-pay

## 2019-06-09 VITALS — BP 132/80 | HR 67 | Temp 97.4°F | Ht 70.0 in | Wt 230.8 lb

## 2019-06-09 DIAGNOSIS — R0600 Dyspnea, unspecified: Secondary | ICD-10-CM

## 2019-06-09 LAB — CBC WITH DIFFERENTIAL/PLATELET
Basophils Absolute: 0 10*3/uL (ref 0.0–0.1)
Basophils Relative: 0.4 % (ref 0.0–3.0)
Eosinophils Absolute: 0.1 10*3/uL (ref 0.0–0.7)
Eosinophils Relative: 1.9 % (ref 0.0–5.0)
HCT: 45.9 % (ref 39.0–52.0)
Hemoglobin: 15.2 g/dL (ref 13.0–17.0)
Lymphocytes Relative: 28.9 % (ref 12.0–46.0)
Lymphs Abs: 1.6 10*3/uL (ref 0.7–4.0)
MCHC: 33.1 g/dL (ref 30.0–36.0)
MCV: 96.1 fl (ref 78.0–100.0)
Monocytes Absolute: 0.7 10*3/uL (ref 0.1–1.0)
Monocytes Relative: 12.8 % — ABNORMAL HIGH (ref 3.0–12.0)
Neutro Abs: 3.1 10*3/uL (ref 1.4–7.7)
Neutrophils Relative %: 56 % (ref 43.0–77.0)
Platelets: 203 10*3/uL (ref 150.0–400.0)
RBC: 4.77 Mil/uL (ref 4.22–5.81)
RDW: 14.7 % (ref 11.5–15.5)
WBC: 5.5 10*3/uL (ref 4.0–10.5)

## 2019-06-09 LAB — BRAIN NATRIURETIC PEPTIDE: Pro B Natriuretic peptide (BNP): 102 pg/mL — ABNORMAL HIGH (ref 0.0–100.0)

## 2019-06-09 NOTE — Progress Notes (Addendum)
Jesse Coleman    [id number]    10/04/1955  Primary Care Physician:South, Annie Main, MD  Referring Physician: Reynold Bowen, [address],  [place] [address]  Chief complaint: Consult for dyspnea  HPI: 63 year old with history of hypertension, coronary artery disease, hyperlipidemia.  Complains of dyspnea for the past 3 to 4 years.  Reports an aspiration event about 4 years ago before onset of symptoms.  Has occasional hoarseness of voice, wheezing, chest congestion.  Denies any cough  He was evaluated at [facility] pulmonary a couple of years ago and was thought he had acid reflux.  He does have occasional heartburn symptoms but is well controlled on antiacid medication.  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  He was on epinephrine drip at that time.  Noted to have chest pain with elevated troponins and evaluated by cardiology for coronary artery disease which is treated medically.    Pets: Has a dog.  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  Currently works as a Cabin crew Exposures: No known exposures.  No mold, hot tub, Jacuzzi Smoking history: Never smoker Travel history: Previously lived in [place].  Moved to [place] around 2010.  No significant recent travel Relevant family history: No significant family history of lung disease  Outpatient Encounter Medications as of 06/09/2019  Medication Sig  . aspirin EC 81 MG tablet Take 81 mg by mouth daily.  . bromocriptine (PARLODEL) 2.5 MG tablet Take 1.25 mg by mouth daily.   Marland Kitchen buPROPion (WELLBUTRIN XL) 150 MG 24 hr tablet Take 150 mg by mouth daily.  . cholecalciferol (VITAMIN D3) 25 MCG (1000 UT) tablet Take 1,000 Units by mouth daily.  Marland Kitchen EPINEPHrine 0.3 mg/0.3 mL IJ SOAJ injection Inject 0.3 mLs (0.3 mg total) into the muscle as needed for anaphylaxis.  . famotidine (PEPCID) 20 MG tablet Take 1 tablet (20 mg total) by mouth 2 (two) times daily.   . isosorbide mononitrate (IMDUR) 30 MG 24 hr tablet Take 30 mg by mouth daily.  Marland Kitchen LORazepam (ATIVAN) 1 MG tablet Take 1 mg by mouth every 8 (eight) hours.   . metoprolol succinate (TOPROL-XL) 100 MG 24 hr tablet Take 100 mg by mouth daily. Take with or immediately following a meal.  . Multiple Vitamin (MULTIVITAMIN) capsule Take 1 capsule by mouth daily.  Marland Kitchen NITROSTAT 0.4 MG SL tablet Place 0.4 mg under the tongue every 5 (five) minutes as needed for chest pain.   . vitamin B-12 (CYANOCOBALAMIN) 1000 MCG tablet Take 1,000 mcg by mouth daily.   No facility-administered encounter medications on file as of 06/09/2019.     Allergies as of 06/09/2019 - Review Complete 06/09/2019  Allergen Reaction Noted  . Latex Swelling 01/27/2013    Past Medical History:  Diagnosis Date  . CAD (coronary artery disease)   . Erectile dysfunction   . Hematospermia   . Hyperlipidemia   . Hypertension 2014  . Overweight(278.02)   . 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 CARDIAC CATHETERIZATION  2014   LAD 40%, D1&D2 70-80% (1.57mm), CFX 20%, OM1 & OM2 90% (1.O mm), OM3 50%, RCA 90% (1.5-1.75 mm), EF 40-45%, med rx  . COLONOSCOPY  [address] [place]  . Dental implant  2007  . [place]   [place], [place]  . KNEE  SURGERY    . REFRACTIVE SURGERY Right 2003  . VASECTOMY  2003    Family History  Problem Relation Age of Onset  . Coronary artery disease Father 28       sudden cardiac death  . Emphysema Mother        smoked    Social History   Socioeconomic History  . Marital status: Married    Spouse name: Not on file  . Number of children: 2  . Years of education: Not on file  . Highest education level: Not on file  Occupational History  . Occupation: Real estate Scientist, clinical (histocompatibility and immunogenetics): [employer]   Social Needs  . Financial resource strain: Not on file  . Food insecurity     Worry: Not on file    Inability: Not on file  . Transportation needs    Medical: Not on file    Non-medical: Not on file  Tobacco Use  . Smoking status: Never Smoker  . Smokeless tobacco: Never Used  Substance and Sexual Activity  . Alcohol use: Yes    Alcohol/week: 1.0 standard drinks    Types: 1 drink(s) per week  . Drug use: No  . Sexual activity: Not on file  Lifestyle  . Physical activity    Days per week: Not on file    Minutes per session: Not on file  . Stress: Not on file  Relationships  . Social Herbalist on phone: Not on file    Gets together: Not on file    Attends religious service: Not on file    Active member of club or organization: Not on file    Attends meetings of clubs or organizations: Not on file    Relationship status: Not on file  . Intimate partner violence    Fear of current or ex partner: Not on file    Emotionally abused: Not on file    Physically abused: Not on file    Forced sexual activity: Not on file  Other Topics Concern  . Not on file  Social History Narrative  . Not on file    Review of systems: Review of Systems  Constitutional: Negative for fever and chills.  HENT: Negative.   Eyes: Negative for blurred vision.  Respiratory: as per HPI  Cardiovascular: Negative for chest pain and palpitations.  Gastrointestinal: Negative for vomiting, diarrhea, blood per rectum. Genitourinary: Negative for dysuria, urgency, frequency and hematuria.  Musculoskeletal: Negative for myalgias, back pain and joint pain.  Skin: Negative for itching and rash.  Neurological: Negative for dizziness, tremors, focal weakness, seizures and loss of consciousness.  Endo/Heme/Allergies: Negative for environmental allergies.  Psychiatric/Behavioral: Negative for depression, suicidal ideas and hallucinations.  All other systems reviewed and are negative.  Physical Exam: Blood pressure 132/80, pulse 67, temperature (!) 97.4 F (36.3 C),  temperature source Temporal, height 5\' 10"  (1.778 m), weight 230 lb 12.8 oz (104.7 kg), SpO2 99 %. Gen:      No acute distress HEENT:  EOMI, sclera anicteric Neck:     No masses; no thyromegaly Lungs:    Clear to auscultation bilaterally; normal respiratory effort CV:         Regular rate and rhythm; no murmurs Abd:      + bowel sounds; soft, non-tender; no palpable masses, no distension Ext:    No edema; adequate peripheral perfusion Skin:      Warm and dry; no rash Neuro: alert and oriented x  3 Psych: normal mood and affect  Data Reviewed: Imaging: CT chest 09/28/2013- no pulmonary abnormalities, coronary atherosclerosis Chest x-ray 04/22/2019- no active cardiopulmonary disease I have reviewed the images personally  Labs: CBC 06/09/2019-WBC 5.5, eos 1.9%, absolute eosinophil count 104  SARS-CoV-2 04/23/2019-negative  Assessment:  Consult for dyspnea Unclear etiology of his symptoms He is a non-smoker, symptoms atypical for asthma Check labs including CBC differential, IgE, d-dimer today Schedule pulmonary function test and CT chest for further evaluation  Discussed trial of inhalers today with patient but he would like to hold off until we get the results back on these tests.  Plan/Recommendations: - CBC, IgE, d-dimer - PFTs, CT chest  Marshell Garfinkel MD [facility] 06/09/2019, 2:25 PM  CC: Reynold Bowen, MD

## 2019-06-09 NOTE — Patient Instructions (Addendum)
We will check some blood test today including CBC differential, IgE, d-dimer and BNP We will schedule you for pulmonary function testing high-resolution CT for evaluation of your persistent symptoms of dyspnea Follow-up in 2 to 4 weeks for review of tests and plan for next labs.

## 2019-06-10 ENCOUNTER — Encounter (HOSPITAL_COMMUNITY): Payer: Self-pay | Admitting: Emergency Medicine

## 2019-06-10 ENCOUNTER — Inpatient Hospital Stay (HOSPITAL_COMMUNITY): Payer: Medicare Other

## 2019-06-10 ENCOUNTER — Ambulatory Visit
Admission: RE | Admit: 2019-06-10 | Discharge: 2019-06-10 | Disposition: A | Discharge status: Home / Self Care | Payer: Medicare Other | Source: Ambulatory Visit | Attending: Pulmonary Disease | Admitting: Pulmonary Disease

## 2019-06-10 ENCOUNTER — Inpatient Hospital Stay (HOSPITAL_COMMUNITY)
Admission: EM | Admit: 2019-06-10 | Discharge: 2019-06-13 | DRG: 175 | Disposition: A | Discharge status: Home / Self Care | Payer: Medicare Other | Attending: Internal Medicine | Admitting: Internal Medicine

## 2019-06-10 ENCOUNTER — Telehealth: Payer: Self-pay | Admitting: *Deleted

## 2019-06-10 ENCOUNTER — Other Ambulatory Visit: Payer: Self-pay

## 2019-06-10 ENCOUNTER — Other Ambulatory Visit: Payer: Self-pay | Admitting: *Deleted

## 2019-06-10 DIAGNOSIS — R7989 Other specified abnormal findings of blood chemistry: Secondary | ICD-10-CM

## 2019-06-10 DIAGNOSIS — E785 Hyperlipidemia, unspecified: Secondary | ICD-10-CM | POA: Diagnosis present

## 2019-06-10 DIAGNOSIS — Z86711 Personal history of pulmonary embolism: Secondary | ICD-10-CM

## 2019-06-10 DIAGNOSIS — N189 Chronic kidney disease, unspecified: Secondary | ICD-10-CM | POA: Diagnosis present

## 2019-06-10 DIAGNOSIS — I10 Essential (primary) hypertension: Secondary | ICD-10-CM | POA: Diagnosis not present

## 2019-06-10 DIAGNOSIS — R0902 Hypoxemia: Secondary | ICD-10-CM | POA: Diagnosis present

## 2019-06-10 DIAGNOSIS — I361 Nonrheumatic tricuspid (valve) insufficiency: Secondary | ICD-10-CM | POA: Diagnosis not present

## 2019-06-10 DIAGNOSIS — Z8249 Family history of ischemic heart disease and other diseases of the circulatory system: Secondary | ICD-10-CM

## 2019-06-10 DIAGNOSIS — I2699 Other pulmonary embolism without acute cor pulmonale: Secondary | ICD-10-CM | POA: Diagnosis not present

## 2019-06-10 DIAGNOSIS — Z87892 Personal history of anaphylaxis: Secondary | ICD-10-CM | POA: Diagnosis not present

## 2019-06-10 DIAGNOSIS — Z825 Family history of asthma and other chronic lower respiratory diseases: Secondary | ICD-10-CM | POA: Diagnosis not present

## 2019-06-10 DIAGNOSIS — I255 Ischemic cardiomyopathy: Secondary | ICD-10-CM | POA: Diagnosis present

## 2019-06-10 DIAGNOSIS — I2609 Other pulmonary embolism with acute cor pulmonale: Principal | ICD-10-CM | POA: Diagnosis present

## 2019-06-10 DIAGNOSIS — K219 Gastro-esophageal reflux disease without esophagitis: Secondary | ICD-10-CM | POA: Diagnosis present

## 2019-06-10 DIAGNOSIS — D352 Benign neoplasm of pituitary gland: Secondary | ICD-10-CM | POA: Diagnosis present

## 2019-06-10 DIAGNOSIS — I25119 Atherosclerotic heart disease of native coronary artery with unspecified angina pectoris: Secondary | ICD-10-CM | POA: Diagnosis present

## 2019-06-10 DIAGNOSIS — I340 Nonrheumatic mitral (valve) insufficiency: Secondary | ICD-10-CM

## 2019-06-10 DIAGNOSIS — I447 Left bundle-branch block, unspecified: Secondary | ICD-10-CM | POA: Diagnosis present

## 2019-06-10 DIAGNOSIS — I8290 Acute embolism and thrombosis of unspecified vein: Secondary | ICD-10-CM

## 2019-06-10 DIAGNOSIS — Z20828 Contact with and (suspected) exposure to other viral communicable diseases: Secondary | ICD-10-CM | POA: Diagnosis present

## 2019-06-10 DIAGNOSIS — Z91040 Latex allergy status: Secondary | ICD-10-CM | POA: Diagnosis not present

## 2019-06-10 DIAGNOSIS — I129 Hypertensive chronic kidney disease with stage 1 through stage 4 chronic kidney disease, or unspecified chronic kidney disease: Secondary | ICD-10-CM | POA: Diagnosis present

## 2019-06-10 DIAGNOSIS — I2694 Multiple subsegmental pulmonary emboli without acute cor pulmonale: Secondary | ICD-10-CM | POA: Diagnosis not present

## 2019-06-10 LAB — CBC WITH DIFFERENTIAL/PLATELET
Abs Immature Granulocytes: 0.02 10*3/uL (ref 0.00–0.07)
Basophils Absolute: 0 10*3/uL (ref 0.0–0.1)
Basophils Relative: 0 %
Eosinophils Absolute: 0.1 10*3/uL (ref 0.0–0.5)
Eosinophils Relative: 1 %
HCT: 46.4 % (ref 39.0–52.0)
Hemoglobin: 14.9 g/dL (ref 13.0–17.0)
Immature Granulocytes: 0 %
Lymphocytes Relative: 27 %
Lymphs Abs: 1.6 10*3/uL (ref 0.7–4.0)
MCH: 31.8 pg (ref 26.0–34.0)
MCHC: 32.1 g/dL (ref 30.0–36.0)
MCV: 98.9 fL (ref 80.0–100.0)
Monocytes Absolute: 0.6 10*3/uL (ref 0.1–1.0)
Monocytes Relative: 11 %
Neutro Abs: 3.5 10*3/uL (ref 1.7–7.7)
Neutrophils Relative %: 61 %
Platelets: 187 10*3/uL (ref 150–400)
RBC: 4.69 MIL/uL (ref 4.22–5.81)
RDW: 13.6 % (ref 11.5–15.5)
WBC: 5.8 10*3/uL (ref 4.0–10.5)
nRBC: 0 % (ref 0.0–0.2)

## 2019-06-10 LAB — TROPONIN I (HIGH SENSITIVITY)
Troponin I (High Sensitivity): 12 ng/L (ref <18)
Troponin I (High Sensitivity): 12 ng/L (ref <18)

## 2019-06-10 LAB — BRAIN NATRIURETIC PEPTIDE: B Natriuretic Peptide: 111.6 pg/mL — ABNORMAL HIGH (ref 0.0–100.0)

## 2019-06-10 LAB — SARS CORONAVIRUS 2 BY RT PCR (HOSPITAL ORDER, PERFORMED IN ~~LOC~~ HOSPITAL LAB): SARS Coronavirus 2: NEGATIVE

## 2019-06-10 LAB — D-DIMER, QUANTITATIVE: D-Dimer, Quant: 4.44 mcg/mL FEU — ABNORMAL HIGH (ref <0.50)

## 2019-06-10 LAB — BASIC METABOLIC PANEL
Anion gap: 7 (ref 5–15)
BUN: 14 mg/dL (ref 8–23)
CO2: 22 mmol/L (ref 22–32)
Calcium: 9.5 mg/dL (ref 8.9–10.3)
Chloride: 107 mmol/L (ref 98–111)
Creatinine, Ser: 1.45 mg/dL — ABNORMAL HIGH (ref 0.61–1.24)
GFR calc Af Amer: 58 mL/min — ABNORMAL LOW (ref >60)
GFR calc non Af Amer: 50 mL/min — ABNORMAL LOW (ref >60)
Glucose, Bld: 88 mg/dL (ref 70–99)
Potassium: 4.3 mmol/L (ref 3.5–5.1)
Sodium: 136 mmol/L (ref 135–145)

## 2019-06-10 LAB — ECHOCARDIOGRAM LIMITED
Height: 70 in
Weight: 3692.79 oz

## 2019-06-10 LAB — IGE: IgE (Immunoglobulin E), Serum: 204 kU/L — ABNORMAL HIGH (ref <=114)

## 2019-06-10 LAB — HEPARIN LEVEL (UNFRACTIONATED): Heparin Unfractionated: 1.01 IU/mL — ABNORMAL HIGH (ref 0.30–0.70)

## 2019-06-10 MED ORDER — SODIUM CHLORIDE 0.9 % IV SOLN: 250.0000 mL | INTRAVENOUS | Status: DC | PRN

## 2019-06-10 MED ORDER — ONDANSETRON HCL 4 MG/2ML IJ SOLN: 4.0000 mg | Freq: Four times a day (QID) | INTRAMUSCULAR | Status: DC | PRN

## 2019-06-10 MED ORDER — HEPARIN (PORCINE) 25000 UT/250ML-% IV SOLN
1700.0000 [IU]/h | INTRAVENOUS | Status: DC
  Administered 2019-06-10: 1700 [IU]/h via INTRAVENOUS
  Filled 2019-06-10: qty 250

## 2019-06-10 MED ORDER — LORAZEPAM 1 MG PO TABS
1.0000 mg | ORAL_TABLET | Freq: Three times a day (TID) | ORAL | Status: DC | PRN
  Administered 2019-06-10 – 2019-06-12 (×3): 1 mg via ORAL
  Filled 2019-06-10 (×3): qty 1

## 2019-06-10 MED ORDER — SODIUM CHLORIDE 0.9% FLUSH: 3.0000 mL | INTRAVENOUS | Status: DC | PRN

## 2019-06-10 MED ORDER — ISOSORBIDE MONONITRATE ER 30 MG PO TB24: 30.0000 mg | ORAL_TABLET | Freq: Every day | ORAL | Status: DC

## 2019-06-10 MED ORDER — SODIUM CHLORIDE 0.9% FLUSH
3.0000 mL | Freq: Two times a day (BID) | INTRAVENOUS | Status: DC
  Administered 2019-06-12 – 2019-06-13 (×2): 3 mL via INTRAVENOUS

## 2019-06-10 MED ORDER — ADULT MULTIVITAMIN W/MINERALS CH
1.0000 | ORAL_TABLET | Freq: Every day | ORAL | Status: DC
  Administered 2019-06-10 – 2019-06-12 (×3): 1 via ORAL
  Filled 2019-06-10 (×3): qty 1

## 2019-06-10 MED ORDER — FAMOTIDINE 20 MG PO TABS
20.0000 mg | ORAL_TABLET | Freq: Two times a day (BID) | ORAL | Status: DC
  Administered 2019-06-10 – 2019-06-13 (×6): 20 mg via ORAL
  Filled 2019-06-10 (×6): qty 1

## 2019-06-10 MED ORDER — VITAMIN D 25 MCG (1000 UNIT) PO TABS
1000.0000 [IU] | ORAL_TABLET | Freq: Every day | ORAL | Status: DC
  Administered 2019-06-10 – 2019-06-12 (×3): 1000 [IU] via ORAL
  Filled 2019-06-10 (×3): qty 1

## 2019-06-10 MED ORDER — ALBUTEROL SULFATE (2.5 MG/3ML) 0.083% IN NEBU: 2.5000 mg | INHALATION_SOLUTION | RESPIRATORY_TRACT | Status: DC | PRN

## 2019-06-10 MED ORDER — BUPROPION HCL ER (XL) 150 MG PO TB24
150.0000 mg | ORAL_TABLET | Freq: Every day | ORAL | Status: DC
  Administered 2019-06-10 – 2019-06-12 (×3): 150 mg via ORAL
  Filled 2019-06-10 (×4): qty 1

## 2019-06-10 MED ORDER — ONDANSETRON HCL 4 MG PO TABS: 4.0000 mg | ORAL_TABLET | Freq: Four times a day (QID) | ORAL | Status: DC | PRN

## 2019-06-10 MED ORDER — MULTIVITAMINS PO CAPS: 1.0000 | ORAL_CAPSULE | Freq: Every day | ORAL | Status: DC

## 2019-06-10 MED ORDER — HEPARIN BOLUS VIA INFUSION
6500.0000 [IU] | Freq: Once | INTRAVENOUS | Status: AC
  Administered 2019-06-10: 6500 [IU] via INTRAVENOUS
  Filled 2019-06-10: qty 6500

## 2019-06-10 MED ORDER — METOPROLOL SUCCINATE ER 100 MG PO TB24
100.0000 mg | ORAL_TABLET | Freq: Every day | ORAL | Status: DC
  Administered 2019-06-11 – 2019-06-13 (×3): 100 mg via ORAL
  Filled 2019-06-10 (×3): qty 1

## 2019-06-10 MED ORDER — VITAMIN B-12 1000 MCG PO TABS
1000.0000 ug | ORAL_TABLET | Freq: Every day | ORAL | Status: DC
  Administered 2019-06-10 – 2019-06-12 (×3): 1000 ug via ORAL
  Filled 2019-06-10 (×3): qty 1

## 2019-06-10 MED ORDER — HEPARIN (PORCINE) 25000 UT/250ML-% IV SOLN
1250.0000 [IU]/h | INTRAVENOUS | Status: DC
  Administered 2019-06-10 – 2019-06-11 (×2): 1400 [IU]/h via INTRAVENOUS
  Administered 2019-06-12: 1250 [IU]/h via INTRAVENOUS
  Filled 2019-06-10 (×3): qty 250

## 2019-06-10 MED ORDER — BROMOCRIPTINE MESYLATE 2.5 MG PO TABS
1.2500 mg | ORAL_TABLET | Freq: Every day | ORAL | Status: DC
  Administered 2019-06-10 – 2019-06-12 (×3): 1.25 mg via ORAL
  Filled 2019-06-10 (×4): qty 1

## 2019-06-10 MED ORDER — NITROGLYCERIN 0.4 MG SL SUBL: 0.4000 mg | SUBLINGUAL_TABLET | SUBLINGUAL | Status: DC | PRN

## 2019-06-10 MED ORDER — ISOSORBIDE DINITRATE 30 MG PO TABS
30.0000 mg | ORAL_TABLET | Freq: Two times a day (BID) | ORAL | Status: DC
  Administered 2019-06-10 – 2019-06-13 (×6): 30 mg via ORAL
  Filled 2019-06-10 (×8): qty 1

## 2019-06-10 MED ORDER — IOPAMIDOL (ISOVUE-370) INJECTION 76%
75.0000 mL | Freq: Once | INTRAVENOUS | Status: AC | PRN
  Administered 2019-06-10: 75 mL via INTRAVENOUS

## 2019-06-10 NOTE — Consult Note (Addendum)
Cardiology Consultation:   Patient ID: Jesse Coleman MRN: [id number]; DOB: Dec 21, 1955  Admit date: 06/10/2019 Date of Consult: 06/10/2019  Primary Care Provider: Reynold Bowen, MD Primary Cardiologist: Buford Dresser, MD  Primary Electrophysiologist:  None    Patient Profile:   Jesse Coleman is a 63 y.o. male with a hx of LBBB, CAD ('57), HTN, HL intolerant to statins, and remote pituitary adenoma who is being seen today for the evaluation of abnormal EKG and + troponin at the request of Dr. Laren Everts.  History of Present Illness:   Jesse Coleman is a 63 yo male with PMH noted above. Followed by Dr. Harrell Gave as an outpatient. Cath back in 2014 with small caliber CAD in an nondominant RCA, diag and OM not amenable to PCI. Treated medically with ASA, stain, BB and Ranexa. Most recently was admitted on 04/23/19 with anaphylaxis requiring an epi gtt. Developed chest pain with elevated troponins. This was felt to be demand ischemia in the setting of acute illness. Echo showed normal LVEF at 60-65% with no rWMA.   He was last seen in the office on 05/21/19 with Dr. Harrell Gave. Reported continued episodes of shortness of breath, but no further chest tightness. Given his ongoing shortness of breath a repeat echo was ordered with normal EF with mild LVH. It was noted he has been intolerant to statins 2/2 to myalgias, and had stopped his Zetia (unclear why). He was referred for lipid clinic to discuss PCSK9s.   He presented to the ED with worsening exertional shortness of breath. He was being followed as an outpatient for this by pulmonology. He was called today by the office with an elevated Ddimer of 4.4 and told to come to the ED. CT angio showed bilateral pulmonary emboli with suspicion of right heart strain. EKG showed SB with nonspecific TW changes in lateral leads. Other labs showed stable electrolytes, Cr 1.45, BNP 111.6, Hgb 14.9, HsT 12. Denies any chest pain prior to admission.  In  talking with the patient he reports son with hx of recurrent DVT but he has been work up for coagulopathy without any significant findings. Patient has never had any clotting issues prior to this event. He was placed on IV heparin while in the ED. PCCM was consulted on admission who agreed with heparin with plans for [facility].   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 Authorizing Provider  acetaminophen (TYLENOL 8 HOUR ARTHRITIS PAIN) 650 MG CR tablet Take 650 mg by mouth 2 (two) times daily as needed for pain.    Yes [provider]  aspirin EC 81 MG tablet Take 81 mg by mouth  daily.   Yes [provider]  bromocriptine (PARLODEL) 2.5 MG tablet Take 1.25 mg by mouth at bedtime.  01/19/13  Yes [provider]  buPROPion (WELLBUTRIN XL) 150 MG 24 hr tablet Take 150 mg by mouth at bedtime.    Yes [provider]  cholecalciferol (VITAMIN D3) 25 MCG (1000 UT) tablet Take 1,000 Units by mouth at bedtime.    Yes [provider]  EPINEPHrine 0.3 mg/0.3 mL IJ SOAJ injection Inject 0.3 mLs (0.3 mg total) into the muscle as needed for anaphylaxis. 04/24/19  Yes Patrecia Pour, Christean Grief, MD  famotidine (PEPCID) 20 MG tablet Take 1 tablet (20 mg  total) by mouth 2 (two) times daily. 04/24/19  Yes Patrecia Pour, Christean Grief, MD  isosorbide dinitrate (ISORDIL) 30 MG tablet Take 30 mg by mouth 2 (two) times daily.    Yes [provider]  LORazepam (ATIVAN) 1 MG tablet Take 1 mg by mouth every 8 (eight) hours as needed for anxiety.  01/19/13  Yes [provider]  metoprolol succinate (TOPROL-XL) 100 MG 24 hr tablet Take 100 mg by mouth daily. Take with or immediately following a meal.   Yes [provider]  Multiple Vitamin (MULTIVITAMIN) capsule Take 1 capsule by mouth at bedtime.    Yes [provider]  NITROSTAT 0.4 MG SL tablet Place 0.4 mg under the tongue every 5 (five) minutes as needed for chest pain.  01/19/13  Yes [provider]  vitamin B-12 (CYANOCOBALAMIN) 1000 MCG tablet Take 1,000 mcg by mouth at bedtime.    Yes [provider]    Inpatient Medications: Scheduled Meds:  bromocriptine  1.25 mg Oral Daily   buPROPion  150 mg Oral QHS   cholecalciferol  1,000 Units Oral QHS   famotidine  20 mg Oral BID   isosorbide mononitrate  30 mg Oral Daily   metoprolol succinate  100 mg Oral Daily   multivitamin with minerals  1 tablet Oral QHS   sodium chloride flush  3 mL Intravenous Q12H   vitamin B-12  1,000 mcg Oral QHS   Continuous Infusions:  sodium chloride     heparin 1,700 Units/hr (06/10/19 1424)   PRN Meds: sodium chloride, albuterol, LORazepam, nitroGLYCERIN, ondansetron **OR** ondansetron (ZOFRAN) IV, sodium chloride flush  Allergies:    Allergies  Allergen Reactions   Latex Swelling and Other (See Comments)    Lips swell, but no breathing issues    Social History:   Social History   Socioeconomic History   Marital status: Married    Spouse name: Not on file   Number of children: 2   Years of education: Not on file   Highest education level: Not on file  Occupational History   Occupation: Real estate Scientist, clinical (histocompatibility and immunogenetics): [place] resource strain: Not on file   Food insecurity    Worry: Not on file    Inability: Not on file   Transportation needs    Medical: Not on file    Non-medical: Not on file  Tobacco Use   Smoking status: Never Smoker   Smokeless tobacco: Never Used  Substance and Sexual Activity   Alcohol use: Yes    Alcohol/week: 1.0 standard drinks    Types: 1 drink(s) per week   Drug use: No   Sexual activity: Not on file  Lifestyle   Physical activity    Days per week: Not on file  Minutes per session: Not on file   Stress: Not on file  Relationships   Social connections    Talks on phone: Not on file    Gets together: Not on file    Attends religious service: Not on file    Active member of club or organization: Not on file    Attends meetings of clubs or organizations: Not on file    Relationship status: Not on file   Intimate partner violence    Fear of current or ex partner: Not on file    Emotionally abused: Not on file    Physically abused: Not on file    Forced sexual activity: Not on file  Other Topics Concern   Not on file  Social History Narrative   Not on file    Family History:    Family History  Problem Relation Age of Onset   Coronary artery disease Father 18       sudden cardiac death   Emphysema Mother        smoked     ROS:  Please see the history of present illness.   All other ROS reviewed and negative.     Physical Exam/Data:   Vitals:   06/10/19 1237 06/10/19 1302 06/10/19 1400 06/10/19 1600  BP: (!) 189/98 (!) 178/96 128/85 131/74  Pulse: 62 62 (!) 56 (!) 58  Resp: 18 17 18 17   Temp: 98.4 F (36.9 C)     TempSrc: Oral     SpO2: 96% 97% 95% 95%  Weight:      Height:       No intake or output data in the 24 hours ending 06/10/19 1619 Last 3 Weights 06/10/2019 06/09/2019 05/21/2019  Weight (lbs) 230 lb 12.8 oz 230 lb 12.8 oz 231 lb  Weight (kg) 104.69 kg 104.69 kg 104.781 kg     Body mass index is  33.12 kg/m.  General:  Well nourished, well developed, in no acute distress HEENT: normal Lymph: no adenopathy Neck: no JVD Vascular: No carotid bruits; FA pulses 2+ bilaterally without bruits  Cardiac:  normal S1, S2; RRR; no murmur Lungs:  clear to auscultation bilaterally, no wheezing, rhonchi or rales  Abd: soft, nontender, no hepatomegaly  Ext: no edema Musculoskeletal:  No deformities, BUE and BLE strength normal and equal Skin: warm and dry  Neuro:  CNs 2-12 intact, no focal abnormalities noted Psych:  Normal affect   EKG:  The EKG was personally reviewed and demonstrates: SB with nonspecific TW changes in lateral leads.  Relevant CV Studies:  CATH: 02/02/2013 Angiographic Findings: Left main: No obstructive disease.  Left Anterior Descending Artery: Large caliber vessel that courses to the apex. The entire proximal and mid vessel has severe calcification. There is diffuse 40% stenosis throughout the proximal and mid vessel but this does not appear to be flow limiting. The distal LAD becomes smaller in caliber and has diffuse 30% stenosis. There are 3 very small caliber diagonal branches. The first two diagonal branches are 1.0 mm in diameter and each has 70-80% ostial stenosis (too small for PCI).  Circumflex Artery: Large caliber vessel with 20% proximal stenosis. The AV groove Circumflex leads into a moderate caliber left sided posterolateral branch with mild plaque. The first obtuse marginal branch is very small in caliber (1. 0 mm) and has diffuse 90% proximal stenosis. The second obtuse marginal branch is very small in caliber and has severe distal stenosis. (too small for PCI). The third obtuse marginal branch  is small to moderate in caliber and has proximal 50% stenosis, mod 40% stenosis.  Right Coronary Artery: Small caliber, non-dominant vessel with 90% proximal stenosis. This vessel appears to be 1.5-1.75 mm in caliber (too small for PCI)  Left Ventricular Angiogram: LVEF  40-45%, global hypokinesis. Impression: 1. 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 The RCA is small and non-dominant. The branches with high grade disease are too small for PCI.  2. Mild to moderate LV systolic dysfunction  Recommendations: His major epicardial vessels are free of flow limiting disease. His angina is likely from the high grade disease in the very small caliber branch vessels. Will continue medical management. Will add Imdur 30 mg po QHS and Toprol 50 mg po Qdaily. He will need to stop using Levitra while he is on Imdur.   TTE: 05/26/19  IMPRESSIONS    1. The left ventricle has normal systolic function, with an ejection fraction of 55-60%. The cavity size was normal. There is mildly increased left ventricular wall thickness. Left ventricular diastolic Doppler parameters are consistent with impaired  relaxation.  2. The right ventricle has normal systolic function. The cavity was normal. There is no increase in right ventricular wall thickness.  3. Right atrial size was mildly dilated.  4. The mitral valve is grossly normal. Mild thickening of the mitral valve leaflet.  5. The tricuspid valve is grossly normal.  6. The aortic valve is tricuspid. Mild calcification of the aortic valve. Aortic valve regurgitation is mild by color flow Doppler. No stenosis of the aortic valve.  7. The aorta is abnormal unless otherwise noted.  8. There is mild dilatation of the ascending aorta.  9. The atrial septum is grossly normal. 10. The average left ventricular global longitudinal strain is -12.4 %.  Laboratory Data:  High Sensitivity Troponin:   Recent Labs  Lab 06/10/19 1340  TROPONINIHS 12     Chemistry Recent Labs  Lab 06/10/19 1340  NA 136  K 4.3  CL 107  CO2 22  GLUCOSE 88  BUN 14  CREATININE 1.45*  CALCIUM 9.5  GFRNONAA 50*  GFRAA 58*  ANIONGAP 7    No results for input(s): PROT, ALBUMIN, AST, ALT, ALKPHOS, BILITOT in the  last 168 hours. Hematology Recent Labs  Lab 06/09/19 1453 06/10/19 1340  WBC 5.5 5.8  RBC 4.77 4.69  HGB 15.2 14.9  HCT 45.9 46.4  MCV 96.1 98.9  MCH  --  31.8  MCHC 33.1 32.1  RDW 14.7 13.6  PLT 203.0 187   BNP Recent Labs  Lab 06/09/19 1453 06/10/19 1340  BNP  --  111.6*  PROBNP 102.0*  --     DDimer  Recent Labs  Lab 06/09/19 1453  DDIMER 4.44*     Radiology/Studies:  Ct Angio Chest Pe W Or Wo Contrast  Addendum Date: 06/10/2019   ADDENDUM REPORT: 06/10/2019 12:26 ADDENDUM: Critical Value/emergent results were called by telephone at the time of interpretation on 06/10/2019 at 1204 hours to providerPRAVEEN [facility] , who verbally acknowledged these results. Electronically Signed   By: Genevie Ann M.D.   On: 06/10/2019 12:26   Result Date: 06/10/2019 CLINICAL DATA:  63 year old male with shortness of breath, chest pain and abnormal D-dimer. Creatinine was obtained on site at [facility] at [address] Results: Creatinine 1.5 mg/dL. EXAM: CT ANGIOGRAPHY CHEST WITH CONTRAST TECHNIQUE: Multidetector CT imaging of the chest was performed using the standard protocol during bolus administration of intravenous  contrast. Multiplanar CT image reconstructions and MIPs were obtained to evaluate the vascular anatomy. CONTRAST:  78mL ISOVUE-370 IOPAMIDOL (ISOVUE-370) INJECTION 76% COMPARISON:  CT chest 09/28/2013. Portable chest 04/22/2019. FINDINGS: Cardiovascular: Good contrast bolus timing in the pulmonary arterial tree. Positive for pulmonary artery thrombus at the left hilum. Clot extends into the left upper lobe and lingula branches (series 5, image 112). Bulky thrombus in the left lower lobe pulmonary artery branches No saddle embolus, but there is also bulky thrombus at the distal right main pulmonary artery extending into the right upper lobe and right lower lobe branches. Additionally there is inflow artifact versus thrombus in the right inferior pulmonary vein on series  5, image 158. RV / LV ratio = 0.9-1. Overall cardiac size is stable since 2014 with no pericardial effusion. Extensive calcified coronary artery atherosclerosis and/or stents. Grossly negative aorta. Mediastinum/Nodes: No mediastinal lymphadenopathy. Lungs/Pleura: Major airways are patent. No pleural effusion. No pulmonary infarct. No abnormal pulmonary opacity. Upper Abdomen: Difficult to exclude thrombus in the IVC on series 4, image 90, although the IVC has a similar size and caliber to the 2014 comparison. Negative visible liver, gallbladder, spleen, pancreas, adrenal glands and bowel in the upper abdomen. Right upper pole renal cyst with simple fluid density incidentally noted. Musculoskeletal: Bulky and flowing endplate osteophytes throughout the thoracic spine. No acute osseous abnormality identified. Review of the MIP images confirms the above findings. IMPRESSION: 1. Positive for acute pulmonary artery with fairly extensive bilateral thrombus. No saddle embolus, but suspicion of right heart strain on the basis of abnormal RV/LV ratio. 2. No pulmonary infarct or pleural effusion. 3. Questionable thrombus in the IVC and the right inferior pulmonary vein. 4. Calcified coronary artery atherosclerosis. Electronically Signed: By: Genevie Ann M.D. On: 06/10/2019 12:03    Assessment and Plan:   Jesse Coleman is a 63 y.o. male with a hx of LBBB, CAD ('74), HTN, HL intolerant to statins, and remote pituitary adenoma who is being seen today for the evaluation of abnormal EKG and + troponin at the request of Dr. Laren Everts.  1. Acute bilateral PE: presented with worsening shortness of breath since his prior admission back in July. Seen at pulmonology office yesterday with labs drawn and Ddimer noted at 4.4 today. CT angio + for bilateral pulmonary emboli with right heart strain. Placed on IV heparin while in the ED. Vitals are stable on exam. Echo in process. Will need LE dopplers as part of work up, consider coag work  up as well given family hx.  2. CAD: hx of CAD on cath from 2014 not amenable to PCI. Has been managed medically since that time. EKG without ischemic changes. HsT neg (12). No reported chest pain prior to admission. No further work up planned at this time.   3. HTN: on BB therapy  4. HL: hx of statin intolerance. Being followed as an outpatient with plans for lipid clinic referral.   For questions or updates, please contact [place] Please consult [email or web address] for contact info under    Signed, Reino Bellis, NP  06/10/2019 4:19 PM  History and all data above reviewed.  Patient examined.  I agree with the findings as above.  The patient presents with acute pulmonary embolism.  He has a history of CAD managed medically.  He had a recent hospitalization for anaphylactic shock probably related to food (tomatoes).  He saw pulmonary the other day as he has continued SOB although he says that it is slowly getting better.  He had a d dimer as part of his work up and this was found to be elevated.  He was referred for a CT and he is found to have PE with significant clot burden.  He does have some CT suggestion of RV strain but no severe RV dysfunction on echo and he is hemodynamically stable.  He has no acute EKG changes.  There is no significant elevation of the hs Trop.    He denies any pain.    He has The patient exam reveals COR:RRR, no RV lift  ,  Lungs: Clear  ,  Abd: Positive bowel sounds, no rebound no guarding, Ext No edema  .  All available labs, radiology testing, previous records reviewed. Agree with documented assessment and plan.   PE:  Noted to have this and now on appropriate therapy.  No suggestion of hemodynamic compromise.  Continue current therapy.  The question will be whether this is provoked or unprovoked.  Further work up per primary service.   He will have a follow up echo as an out patient.    Jeneen Rinks Woodrow Dulski  5:44 PM  06/10/2019

## 2019-06-10 NOTE — Consult Note (Addendum)
Patient name: Jesse Coleman Medical record number: [id number] Date of birth: August 09, 1956 Age: 63 y.o. Gender: male PCP: Jesse Bowen, MD  Date: 06/10/2019 Reason for Consult: Pulmonary embolism Referring Physician: Merton Coleman  HPI: Jesse Coleman is a 63 yo M w/ PMH of HTN, CAD, GERD, HLD who presents with chief complaint of dyspnea. He was in his usual state of health until July 2020 when he was admitted to [facility] for anaphylaxis secondary to local tomatoes vs bug bite. He had a short hospitalization stay requiring epinephrine gtt at the time. Since discharge, he states he has been having exertional dyspnea worse than prior. He states he has been working this up in outpatient setting and was told to come to [facility] after his lab drawn at Jesse Coleman's office yesterday showed elevated d-dimer and bilateral pulmonary emboli on CTA. He states he had no recent travel, surgery or prolonged immobilization. He mentions that he has a son who has significant recurrent DVT history but he has been worked up for inheritable coagulopathy without significant findings. He denies any prior tobacco history or leg swelling or calf pain.   Past Medical History:  Diagnosis Date  . CAD (coronary artery disease)   . Erectile dysfunction   . Hematospermia   . Hyperlipidemia   . Hypertension 2014  . Overweight(278.02)   . 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 CARDIAC CATHETERIZATION  2014   LAD 40%, D1&D2 70-80% (1.64mm), CFX 20%, OM1 & OM2 90% (1.O mm), OM3 50%, RCA 90% (1.5-1.75 mm), EF 40-45%, med rx  . COLONOSCOPY  [address] [place]  . Dental implant  2007  . [place], [place]  . KNEE SURGERY    . REFRACTIVE SURGERY Right 2003  . VASECTOMY  2003    Family History  Problem Relation Age of Onset  . Coronary artery disease Father 62       sudden cardiac death  . Emphysema Mother         smoked    Social History:  reports that he has never smoked. He has never used smokeless tobacco. He reports current alcohol use of about 1.0 standard drinks of alcohol per week. He reports that he does not use drugs.  Allergies:  Allergies  Allergen Reactions  . Latex Swelling and Other (See Comments)    Lips swell, but no breathing issues    Medications: I have reviewed the patient's current medications.  Pertinent items noted in HPI and remainder of comprehensive ROS otherwise negative.  Temp:  [98.4 F (36.9 C)] 98.4 F (36.9 C) (09/10 1237) Pulse Rate:  [56-62] 56 (09/10 1400) Resp:  [17-18] 18 (09/10 1400) BP: (128-189)/(85-98) 128/85 (09/10 1400) SpO2:  [95 %-97 %] 95 % (09/10 1400) Weight:  [104.7 kg] 104.7 kg (09/10 1236) No intake or output data in the 24 hours ending 06/10/19 1549 Physical exam  Gen: Well-developed, Obese, NAD HEENT: NCAT head, hearing intact, EOMI, MMM Neck: supple, ROM intact, no JVD CV: RRR, S1, S2 normal, No rubs, no murmurs, no gallops Pulm: Distant breath sounds, No rales, no wheezes, Barrel chested Abd: Soft, BS+, NT, Distended Extm: ROM intact, Peripheral pulses intact, No peripheral edema, No calf pain Skin: Dry, Warm, normal turgor  LAB RESULTS BMET    Component Value Date/Time   NA 136 06/10/2019 1340   K 4.3 06/10/2019 1340   CL  107 06/10/2019 1340   CO2 22 06/10/2019 1340   GLUCOSE 88 06/10/2019 1340   BUN 14 06/10/2019 1340   CREATININE 1.45 (H) 06/10/2019 1340   CREATININE 1.19 01/29/2013 1204   CALCIUM 9.5 06/10/2019 1340   GFRNONAA 50 (L) 06/10/2019 1340   GFRAA 58 (L) 06/10/2019 1340   CBC    Component Value Date/Time   WBC 5.8 06/10/2019 1340   RBC 4.69 06/10/2019 1340   HGB 14.9 06/10/2019 1340   HCT 46.4 06/10/2019 1340   PLT 187 06/10/2019 1340   MCV 98.9 06/10/2019 1340   MCH 31.8 06/10/2019 1340   MCHC 32.1 06/10/2019 1340   RDW 13.6 06/10/2019 1340   LYMPHSABS 1.6 06/10/2019 1340   MONOABS  0.6 06/10/2019 1340   EOSABS 0.1 06/10/2019 1340   BASOSABS 0.0 06/10/2019 1340   ABG    Component Value Date/Time   HCO3 16.9 (L) 04/23/2019 0127   TCO2 18 (L) 04/23/2019 0127   ACIDBASEDEF 7.0 (H) 04/23/2019 0127   O2SAT 87.0 04/23/2019 0127   Radiology CTA Chest personally reviewed: bilateral pulmonary embolism R>L  Assessment and plan:  Dyspnea 2/2 Bilateral Pulmonary Edema Confirmed on CTA chest. Currently 96% on RA. On heparin. No obvious inciting event. Unprovoked PE. Family history significant for son with DVTs. Would benefit from outpatient f/u with heme/onc. Troponin negative. EKG w/ T wave inversion on lead 3.  - Admit to medicine - Referral to heme/onc - F/u with pulm outpatient - C/w heparin gtt can switch to oral anticoagulation prior to discharge - O2 [place] as needed to keep O2 sat >90  Attending attestation to follow  Jesse Better, MD PGY2, [place] IM Pager: [phone]  Attending Note:  63 year old male with PMH above presenting with an acute PE.  PCCM consulted for that.  On exam, comfortable with clear lungs.  I reviewed CT myself, bilateral PEs noted.  Discussed with PCCM resident.    PE:  - Heparin  - PO anti-coag  Hypoxemia:  - Titrate O2 for sat of 88-92%  DVT:  - Lower ext doppler check  - Hypocoag work up  PCCM will sign off, please call back if needed.  Patient seen and examined, agree with above note.  I dictated the care and orders written for this patient under my direction.  Jesse Coleman, [place]

## 2019-06-10 NOTE — Progress Notes (Signed)
  Echocardiogram 2D Echocardiogram has been performed.  Marybelle Killings 06/10/2019, 5:21 PM

## 2019-06-10 NOTE — ED Triage Notes (Signed)
Patient sent from PCP due to bilateral PEs with right heart strain. Patient denies any chest pain and shortness of breath with exertion.

## 2019-06-10 NOTE — Telephone Encounter (Signed)
CT scan reviewed with radiologist.  There is bilateral PE with large clot burden and suggestion of right heart strain.  Possible clot in the IVC and inferior pulmonary vein.  I called the patient and told him to go immediately to the emergency room at [facility].  Triage nurse in emergency room called and informed about his pending arrival.  Marshell Garfinkel MD [facility] 06/10/2019, 12:12 PM

## 2019-06-10 NOTE — Progress Notes (Signed)
Pt was transferred to 4e16 from ED. Pt ambulated to bed. A&Ox4, 98% O2 sat on RA. BP was elevated 160/80 d/t ambulation. CHG bath given. Telebox-16 placed, CCMD notified. Pt was educated to the unit and room. Call bell within reach. Pt is resting.   Fransico Reason, RN

## 2019-06-10 NOTE — Progress Notes (Signed)
ANTICOAGULATION CONSULT NOTE - Initial Consult  Pharmacy Consult for Heparin Indication: pulmonary embolus  Allergies  Allergen Reactions  . Latex Swelling and Other (See Comments)    Lips swell, but no breathing issues    Patient Measurements: Height: 5\' 10"  (177.8 cm) Weight: 227 lb (103 kg) IBW/kg (Calculated) : 73 Heparin Dosing Weight: 95.3 kg  Vital Signs: Temp: 98.7 F (37.1 C) (09/10 1952) Temp Source: Oral (09/10 1952) BP: 148/92 (09/10 1952) Pulse Rate: 65 (09/10 1952)  Labs: Recent Labs    06/09/19 1453 06/10/19 1340 06/10/19 1458 06/10/19 2014  HGB 15.2 14.9  --   --   HCT 45.9 46.4  --   --   PLT 203.0 187  --   --   HEPARINUNFRC  --   --   --  1.01*  CREATININE  --  1.45*  --   --   TROPONINIHS  --  12 12  --     Estimated Creatinine Clearance: 61.1 mL/min (A) (by C-G formula based on SCr of 1.45 mg/dL (H)).   Medical History: Past Medical History:  Diagnosis Date  . CAD (coronary artery disease)   . Erectile dysfunction   . Hematospermia   . Hyperlipidemia   . Hypertension 2014  . Overweight(278.02)   . 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 bromocriptine  1.25 mg Oral QHS  . buPROPion  150 mg Oral QHS  . cholecalciferol  1,000 Units Oral QHS  . famotidine  20 mg Oral BID  . isosorbide dinitrate  30 mg Oral BID  . metoprolol succinate  100 mg Oral Daily  . multivitamin with minerals  1 tablet Oral QHS  . sodium chloride flush  3 mL Intravenous Q12H  . vitamin B-12  1,000 mcg Oral QHS   Infusions:  . sodium chloride    . heparin 1,700 Units/hr (06/10/19 1424)    Assessment: 62 y.o. male presenting to ED after having CTA of chest with positive bilateral PEs with right heart strain. PMH includes CAD, ischemic cardiomyopathy, HTN, HLD. No anticoagulation PTA. Pharmacy consulted for heparin dosing.   The patient's heparin level this evening resulted as SUPRAtherapeutic  (HL 1.01, goal of 0.3-0.7). No issues or bleeding noted per discussion with the RN.   Upon chart review, the patient was recently admitted in June and was on Heparin for CP/ACS. At that time - the heparin was therapeutic at a rate of 1300-1400 units/hr.   Goal of Therapy:  Heparin level 0.3-0.7 units/ml Monitor platelets by anticoagulation protocol: Yes   Plan:  - Hold heparin infusion for 1 hour - Reduce the heparin drip rate 1400 units/hr starting at 2230 - Will continue to monitor for any signs/symptoms of bleeding and will follow up with heparin level in 6 hours after restarting  Thank you for allowing pharmacy to be a part of this patient's care.  Alycia Rossetti, PharmD, BCPS Clinical Pharmacist Clinical phone for 06/10/2019: [phone] 06/10/2019 9:30 PM   **Pharmacist phone directory can now be found on [email or web address] ([email or web address]).  Listed under [employer].

## 2019-06-10 NOTE — H&P (Signed)
[facility]                                                                                    Patient Demographics  Jesse Coleman, is a 63 y.o. male  CSN: [id number]  MRN: [id number]  DOB - 01-Jun-1956  Admit Date - 06/10/2019  Outpatient Primary MD for the patient is Reynold Bowen, MD   With History of -  Past Medical History:  Diagnosis Date  . CAD (coronary artery disease)   . Erectile dysfunction   . Hematospermia   . Hyperlipidemia   . Hypertension 2014  . Overweight(278.02)   . 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 CARDIAC CATHETERIZATION  2014   LAD 40%, D1&D2 70-80% (1.66mm), CFX 20%, OM1 & OM2 90% (1.O mm), OM3 50%, RCA 90% (1.5-1.75 mm), EF 40-45%, med rx  . COLONOSCOPY  [address], [place]  . Dental implant  2007  . [place]   [place], [place]  . KNEE SURGERY    . REFRACTIVE SURGERY Right 2003  . VASECTOMY  2003    in for   Chief Complaint  Patient presents with  . Pulmonary Embolism     HPI  Jesse Coleman  is a 63 y.o. male, with past medical history significant for CAD with ischemic Myopathy ejection fraction 40 to 45% by cardiac catheterization in 2014 with history of remote knee surgery in the past presenting ago with few weeks history of worsening shortness of breath.  He was admitted 1 month ago to the hospital for anaphylactic reaction and was treated with steroids.  He was following with Dr. Vaughan Browner yesterday and had labs drawn and his D-dimers were elevated at 4.4, CT angiogram was done showing bilateral pulmonary emboli, patient was started on heparin. In addition to that, blood work in the emergency room showed mild elevation in troponin chronic renal insufficiency which is actually improving.  Otherwise CBC was within normal ranges.     Review of Systems    In addition to the HPI above,  No  Fever-chills, No Headache, No changes with Vision or hearing, No problems swallowing food or Liquids, No Chest pain, Cough  No Abdominal pain, No Nausea or Vommitting, Bowel movements are regular, No Blood in stool or Urine, No dysuria, No new skin rashes or bruises, No new joints pains-aches,  No new weakness, tingling, numbness in any extremity, No recent weight gain or loss, No polyuria, polydypsia or polyphagia, No significant Mental Stressors.  All other systems were reviewed and were negative   Social History Social History   Tobacco Use  . Smoking status: Never Smoker  . Smokeless tobacco: Never Used  Substance Use Topics  . Alcohol use: Yes    Alcohol/week: 1.0 standard drinks    Types: 1 drink(s) per week     Family History Family History  Problem Relation Age of Onset  . Coronary artery disease Father 54       sudden cardiac death  . Emphysema Mother        smoked  Prior to Admission medications   Medication Sig Start Date End Date Taking? Authorizing Provider  acetaminophen (TYLENOL 8 HOUR ARTHRITIS PAIN) 650 MG CR tablet Take 650 mg by mouth 2 (two) times daily as needed.    Yes [provider]  bromocriptine (PARLODEL) 2.5 MG tablet Take 1.25 mg by mouth daily.  01/19/13  Yes [provider]  buPROPion (WELLBUTRIN XL) 150 MG 24 hr tablet Take 150 mg by mouth at bedtime.    Yes [provider]  cholecalciferol (VITAMIN D3) 25 MCG (1000 UT) tablet Take 1,000 Units by mouth at bedtime.    Yes [provider]  EPINEPHrine 0.3 mg/0.3 mL IJ SOAJ injection Inject 0.3 mLs (0.3 mg total) into the muscle as needed for anaphylaxis. 04/24/19  Yes Patrecia Pour, Christean Grief, MD  famotidine (PEPCID) 20 MG tablet Take 1 tablet (20 mg total) by mouth 2 (two) times daily. 04/24/19  Yes Patrecia Pour, Christean Grief, MD  isosorbide mononitrate (IMDUR) 30 MG 24 hr tablet Take 30 mg by mouth daily.   Yes [provider]  LORazepam (ATIVAN) 1 MG  tablet Take 1 mg by mouth every 8 (eight) hours as needed for anxiety.  01/19/13  Yes [provider]  metoprolol succinate (TOPROL-XL) 100 MG 24 hr tablet Take 100 mg by mouth daily. Take with or immediately following a meal.   Yes [provider]  Multiple Vitamin (MULTIVITAMIN) capsule Take 1 capsule by mouth at bedtime.    Yes [provider]  NITROSTAT 0.4 MG SL tablet Place 0.4 mg under the tongue every 5 (five) minutes as needed for chest pain.  01/19/13  Yes [provider]  vitamin B-12 (CYANOCOBALAMIN) 1000 MCG tablet Take 1,000 mcg by mouth at bedtime.    Yes [provider]  aspirin EC 81 MG tablet Take 81 mg by mouth daily.    [provider]    Allergies  Allergen Reactions  . Latex Swelling    Physical Exam  Vitals  Blood pressure 128/85, pulse (!) 56, temperature 98.4 F (36.9 C), temperature source Oral, resp. rate 18, height 5\' 10"  (1.778 m), weight 104.7 kg, SpO2 95 %.    Data Review  CBC Recent Labs  Lab 06/09/19 1453 06/10/19 1340  WBC 5.5 5.8  HGB 15.2 14.9  HCT 45.9 46.4  PLT 203.0 187  MCV 96.1 98.9  MCH  --  31.8  MCHC 33.1 32.1  RDW 14.7 13.6  LYMPHSABS 1.6 1.6  MONOABS 0.7 0.6  EOSABS 0.1 0.1  BASOSABS 0.0 0.0   ------------------------------------------------------------------------------------------------------------------  Chemistries  Recent Labs  Lab 06/10/19 1340  NA 136  K 4.3  CL 107  CO2 22  GLUCOSE 88  BUN 14  CREATININE 1.45*  CALCIUM 9.5   ------------------------------------------------------------------------------------------------------------------ estimated creatinine clearance is 61.6 mL/min (A) (by C-G formula based on SCr of 1.45 mg/dL (H)). ------------------------------------------------------------------------------------------------------------------ No results for input(s): TSH, T4TOTAL, T3FREE, THYROIDAB in the last 72 hours.  Invalid input(s):  FREET3   Coagulation profile No results for input(s): INR, PROTIME in the last 168 hours. ------------------------------------------------------------------------------------------------------------------- Recent Labs    06/09/19 1453  DDIMER 4.44*   -------------------------------------------------------------------------------------------------------------------  Cardiac Enzymes No results for input(s): CKMB, TROPONINI, MYOGLOBIN in the last 168 hours.  Invalid input(s): CK ------------------------------------------------------------------------------------------------------------------ Invalid input(s): POCBNP   ---------------------------------------------------------------------------------------------------------------  Urinalysis No results found for: COLORURINE, APPEARANCEUR, LABSPEC, PHURINE, GLUCOSEU, HGBUR, BILIRUBINUR, KETONESUR, PROTEINUR, UROBILINOGEN, NITRITE, LEUKOCYTESUR  ----------------------------------------------------------------------------------------------------------------     Imaging results:   Ct Angio Chest Pe W Or  Wo Contrast  Addendum Date: 06/10/2019   ADDENDUM REPORT: 06/10/2019 12:26 ADDENDUM: Critical Value/emergent results were called by telephone at the time of interpretation on 06/10/2019 at 1204 hours to providerPRAVEEN [facility] , who verbally acknowledged these results. Electronically Signed   By: Genevie Ann M.D.   On: 06/10/2019 12:26   Result Date: 06/10/2019 CLINICAL DATA:  63 year old male with shortness of breath, chest pain and abnormal D-dimer. Creatinine was obtained on site at [facility] at [address] Results: Creatinine 1.5 mg/dL. EXAM: CT ANGIOGRAPHY CHEST WITH CONTRAST TECHNIQUE: Multidetector CT imaging of the chest was performed using the standard protocol during bolus administration of intravenous contrast. Multiplanar CT image reconstructions and MIPs were obtained to evaluate the vascular anatomy. CONTRAST:   72mL ISOVUE-370 IOPAMIDOL (ISOVUE-370) INJECTION 76% COMPARISON:  CT chest 09/28/2013. Portable chest 04/22/2019. FINDINGS: Cardiovascular: Good contrast bolus timing in the pulmonary arterial tree. Positive for pulmonary artery thrombus at the left hilum. Clot extends into the left upper lobe and lingula branches (series 5, image 112). Bulky thrombus in the left lower lobe pulmonary artery branches No saddle embolus, but there is also bulky thrombus at the distal right main pulmonary artery extending into the right upper lobe and right lower lobe branches. Additionally there is inflow artifact versus thrombus in the right inferior pulmonary vein on series 5, image 158. RV / LV ratio = 0.9-1. Overall cardiac size is stable since 2014 with no pericardial effusion. Extensive calcified coronary artery atherosclerosis and/or stents. Grossly negative aorta. Mediastinum/Nodes: No mediastinal lymphadenopathy. Lungs/Pleura: Major airways are patent. No pleural effusion. No pulmonary infarct. No abnormal pulmonary opacity. Upper Abdomen: Difficult to exclude thrombus in the IVC on series 4, image 90, although the IVC has a similar size and caliber to the 2014 comparison. Negative visible liver, gallbladder, spleen, pancreas, adrenal glands and bowel in the upper abdomen. Right upper pole renal cyst with simple fluid density incidentally noted. Musculoskeletal: Bulky and flowing endplate osteophytes throughout the thoracic spine. No acute osseous abnormality identified. Review of the MIP images confirms the above findings. IMPRESSION: 1. Positive for acute pulmonary artery with fairly extensive bilateral thrombus. No saddle embolus, but suspicion of right heart strain on the basis of abnormal RV/LV ratio. 2. No pulmonary infarct or pleural effusion. 3. Questionable thrombus in the IVC and the right inferior pulmonary vein. 4. Calcified coronary artery atherosclerosis. Electronically Signed: By: Genevie Ann M.D. On: 06/10/2019  12:03    My personal review of EKG:  Assessment & Plan  Bilateral pulmonary emboli/RV strain Start heparin Check echo nebs Pulmonary consulted   Acute coronary syndrome with history of CAD Serial troponin Check EKG in a.m. 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

## 2019-06-10 NOTE — ED Provider Notes (Addendum)
[facility] EMERGENCY DEPARTMENT Provider Note   CSN: [id number] Arrival date & time: 06/10/19  1231     History   Chief Complaint Chief Complaint  Patient presents with  . Pulmonary Embolism    HPI Jesse Coleman is a 63 y.o. male with PMHx CAD, ischemic cardiomyopathy, HTN, HLD, who presents to the ED today after having CTA of his chest with positive bilateral PEs.  He reports that his shortness of breath has been worse in the last 2 to 3 weeks.  He endorses that he was admitted to the hospital a month ago for allergic reaction since then has had worsening shortness of breath.  She was seen by cardiology on 8/21 after hospital admission.  At that point he had endorsed worsening shortness of breath and inability to walk more than 80 yards without shortness of breath.  And again by pulmonology Dr. Vaughan Browner yesterday with labs drawn including d-dimer which was elevated.  Patient had CTA of his chest done today with findings of bilateral PEs and sent to the ED for further evaluation.  Patient has no other complaints besides shortness of breath.      The history is provided by the patient and medical records.    Past Medical History:  Diagnosis Date  . CAD (coronary artery disease)   . Erectile dysfunction   . Hematospermia   . Hyperlipidemia   . Hypertension 2014  . Overweight(278.02)   . 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 Coronary artery disease involving native coronary artery of native heart without angina pectoris 05/21/2019  . Ischemic cardiomyopathy 05/21/2019  . 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 05/21/2019  . Anaphylactic shock 04/23/2019  . AKI (acute kidney injury) ([place])   . Hyperkalemia   . Hyperlipidemia LDL goal <70 06/03/2018  . Chest pain   . Hypertension   . Prolactin-secreting pituitary adenoma [facility])     Past Surgical History:   Procedure Laterality Date  . CARDIAC CATHETERIZATION  2014   LAD 40%, D1&D2 70-80% (1.57mm), CFX 20%, OM1 & OM2 90% (1.O mm), OM3 50%, RCA 90% (1.5-1.75 mm), EF 40-45%, med rx  . COLONOSCOPY  [address] [place]  . Dental implant  2007  . [place]   [place], [place]  . KNEE SURGERY    . REFRACTIVE SURGERY Right 2003  . VASECTOMY  2003        Home Medications    Prior to Admission medications   Medication Sig Start Date End Date Taking? Authorizing Provider  acetaminophen (TYLENOL 8 HOUR ARTHRITIS PAIN) 650 MG CR tablet Take 650 mg by mouth 2 (two) times daily as needed.    Yes [provider]  bromocriptine (PARLODEL) 2.5 MG tablet Take 1.25 mg by mouth daily.  01/19/13  Yes [provider]  buPROPion (WELLBUTRIN XL) 150 MG 24 hr tablet Take 150 mg by mouth at bedtime.    Yes [provider]  cholecalciferol (VITAMIN D3) 25 MCG (1000 UT) tablet Take 1,000 Units by mouth at bedtime.    Yes [provider]  EPINEPHrine 0.3 mg/0.3 mL IJ SOAJ injection Inject 0.3 mLs (0.3 mg total) into the muscle as needed for anaphylaxis. 04/24/19  Yes Patrecia Pour, Christean Grief, MD  famotidine (PEPCID) 20 MG tablet Take 1 tablet (20 mg total) by mouth 2 (two) times daily. 04/24/19  Yes Doreatha Lew, MD  isosorbide mononitrate (IMDUR) 30 MG 24 hr tablet Take 30 mg by mouth daily.   Yes [provider]  LORazepam (ATIVAN) 1 MG tablet Take 1 mg by mouth every 8 (eight) hours as needed for anxiety.  01/19/13  Yes [provider]  metoprolol succinate (TOPROL-XL) 100 MG 24 hr tablet Take 100 mg by mouth daily. Take with or immediately following a meal.   Yes [provider]  Multiple Vitamin (MULTIVITAMIN) capsule Take 1 capsule by mouth at bedtime.    Yes [provider]  NITROSTAT 0.4 MG SL tablet Place 0.4 mg under the tongue every 5 (five) minutes as needed for chest pain.  01/19/13  Yes [provider]   vitamin B-12 (CYANOCOBALAMIN) 1000 MCG tablet Take 1,000 mcg by mouth at bedtime.    Yes [provider]  aspirin EC 81 MG tablet Take 81 mg by mouth daily.    [provider]    Family History Family History  Problem Relation Age of Onset  . Coronary artery disease Father 43       sudden cardiac death  . Emphysema Mother        smoked    Social History Social History   Tobacco Use  . Smoking status: Never Smoker  . Smokeless tobacco: Never Used  Substance Use Topics  . Alcohol use: Yes    Alcohol/week: 1.0 standard drinks    Types: 1 drink(s) per week  . Drug use: No     Allergies   Latex   Review of Systems Review of Systems  Respiratory: Positive for shortness of breath.   All other systems reviewed and are negative.    Physical Exam Updated Vital Signs BP (!) 178/96 (BP Location: Right Arm)   Pulse 62   Temp 98.4 F (36.9 C) (Oral)   Resp 17   Ht 5\' 10"  (1.778 m)   Wt 104.7 kg   SpO2 97%   BMI 33.12 kg/m   Physical Exam Vitals signs and nursing note reviewed.  Constitutional:      Appearance: He is not ill-appearing.  HENT:     Head: Normocephalic and atraumatic.  Eyes:     Conjunctiva/sclera: Conjunctivae normal.  Neck:     Musculoskeletal: Neck supple.  Cardiovascular:     Rate and Rhythm: Normal rate and regular rhythm.     Pulses: Normal pulses.  Pulmonary:     Effort: Pulmonary effort is normal.     Breath sounds: Normal breath sounds. No wheezing, rhonchi or rales.  Abdominal:     Palpations: Abdomen is soft.     Tenderness: There is no abdominal tenderness. There is no guarding or rebound.  Musculoskeletal:     Right lower leg: No edema.     Left lower leg: No edema.  Skin:    General: Skin is warm and dry.  Neurological:     Mental Status: He is alert.      ED Treatments / Results  Labs (all labs ordered are listed, but only abnormal results are displayed) Labs Reviewed  BASIC METABOLIC PANEL -  Abnormal; Notable for the following components:      Result Value   Creatinine, Ser 1.45 (*)    GFR calc non Af Amer 50 (*)    GFR calc Af Amer 58 (*)    All other components within normal limits  BRAIN NATRIURETIC PEPTIDE - Abnormal; Notable for the following components:   B Natriuretic Peptide 111.6 (*)  All other components within normal limits  SARS CORONAVIRUS 2 (HOSPITAL ORDER, [place] [facility])  CBC WITH DIFFERENTIAL/PLATELET  HEPARIN LEVEL (UNFRACTIONATED)  TROPONIN I (HIGH SENSITIVITY)  TROPONIN I (HIGH SENSITIVITY)    EKG EKG Interpretation  Date/Time:  Thursday June 10 2019 12:39:42 EDT Ventricular Rate:  59 PR Interval:  200 QRS Duration: 118 QT Interval:  406 QTC Calculation: 401 R Axis:   -7 Text Interpretation:  Sinus bradycardia Anterior infarct , age undetermined T wave abnormality, consider lateral ischemia Abnormal ECG Confirmed by Sherwood Gambler [phone]) on 06/10/2019 1:12:03 PM   Radiology Ct Angio Chest Pe W Or Wo Contrast  Addendum Date: 06/10/2019   ADDENDUM REPORT: 06/10/2019 12:26 ADDENDUM: Critical Value/emergent results were called by telephone at the time of interpretation on 06/10/2019 at 1204 hours to provider[facility]AVEEN [facility] , who verbally acknowledged these results. Electronically Signed   By: Genevie Ann M.D.   On: 06/10/2019 12:26   Result Date: 06/10/2019 CLINICAL DATA:  63 year old male with shortness of breath, chest pain and abnormal D-dimer. Creatinine was obtained on site at [facility] at [address] Results: Creatinine 1.5 mg/dL. EXAM: CT ANGIOGRAPHY CHEST WITH CONTRAST TECHNIQUE: Multidetector CT imaging of the chest was performed using the standard protocol during bolus administration of intravenous contrast. Multiplanar CT image reconstructions and MIPs were obtained to evaluate the vascular anatomy. CONTRAST:  54mL ISOVUE-370 IOPAMIDOL (ISOVUE-370) INJECTION 76% COMPARISON:  CT chest 09/28/2013.  Portable chest 04/22/2019. FINDINGS: Cardiovascular: Good contrast bolus timing in the pulmonary arterial tree. Positive for pulmonary artery thrombus at the left hilum. Clot extends into the left upper lobe and lingula branches (series 5, image 112). Bulky thrombus in the left lower lobe pulmonary artery branches No saddle embolus, but there is also bulky thrombus at the distal right main pulmonary artery extending into the right upper lobe and right lower lobe branches. Additionally there is inflow artifact versus thrombus in the right inferior pulmonary vein on series 5, image 158. RV / LV ratio = 0.9-1. Overall cardiac size is stable since 2014 with no pericardial effusion. Extensive calcified coronary artery atherosclerosis and/or stents. Grossly negative aorta. Mediastinum/Nodes: No mediastinal lymphadenopathy. Lungs/Pleura: Major airways are patent. No pleural effusion. No pulmonary infarct. No abnormal pulmonary opacity. Upper Abdomen: Difficult to exclude thrombus in the IVC on series 4, image 90, although the IVC has a similar size and caliber to the 2014 comparison. Negative visible liver, gallbladder, spleen, pancreas, adrenal glands and bowel in the upper abdomen. Right upper pole renal cyst with simple fluid density incidentally noted. Musculoskeletal: Bulky and flowing endplate osteophytes throughout the thoracic spine. No acute osseous abnormality identified. Review of the MIP images confirms the above findings. IMPRESSION: 1. Positive for acute pulmonary artery with fairly extensive bilateral thrombus. No saddle embolus, but suspicion of right heart strain on the basis of abnormal RV/LV ratio. 2. No pulmonary infarct or pleural effusion. 3. Questionable thrombus in the IVC and the right inferior pulmonary vein. 4. Calcified coronary artery atherosclerosis. Electronically Signed: By: Genevie Ann M.D. On: 06/10/2019 12:03    Procedures .Critical Care Performed by: Eustaquio Maize, PA-C Authorized  by: Eustaquio Maize, PA-C   Critical care provider statement:    Critical care time (minutes):  40   Critical care was necessary to treat or prevent imminent or life-threatening deterioration of the following conditions:  Circulatory failure   Critical care was time spent personally by me on the following activities:  Discussions with consultants, evaluation of  patient's response to treatment, examination of patient, ordering and performing treatments and interventions, ordering and review of laboratory studies, ordering and review of radiographic studies, pulse oximetry, re-evaluation of patient's condition, obtaining history from patient or surrogate and review of old charts   (including critical care time)  Medications Ordered in ED Medications  heparin ADULT infusion 100 units/mL (25000 units/257mL sodium chloride 0.45%) (1,700 Units/hr Intravenous New Bag/Given 06/10/19 1424)  heparin bolus via infusion 6,500 Units (6,500 Units Intravenous Bolus from Bag 06/10/19 1426)     Initial Impression / Assessment and Plan / ED Course  I have reviewed the triage vital signs and the nursing notes.  Pertinent labs & imaging results that were available during my care of the patient were reviewed by me and considered in my medical decision making (see chart for details).  Clinical Course as of Jun 09 1446  Thu Jun 10, 2019  1431 At baseline  Creatinine(!): 1.45 [MV]    Clinical Course User Index [MV] Eustaquio Maize, PA-C   63 year old male who presents the ED today after having CTA chest done with findings of bilateral PEs.  Patient had worsening shortness of breath after admission to the hospital for allergic reaction in July.  Seen by cardiology and pulmonology with elevated d dimer and positive CTA. Sent here for further eval. Will obtain baseline screening labs today including troponin and BNP.  Heparin pharmacy consult ordered.  Will need to admit at this time.  COVID swab ordered.   2:47  PM Discussed case with hospitalist Dr. Laren Everts who agrees to accept patient for admission.       Final Clinical Impressions(s) / ED Diagnoses   Final diagnoses:  Other acute pulmonary embolism with acute cor pulmonale [facility])    ED Discharge Orders    None       Eustaquio Maize, PA-C 06/10/19 1611    Eustaquio Maize, PA-C 06/10/19 1611    Sherwood Gambler, MD 06/11/19 [phone]

## 2019-06-10 NOTE — Progress Notes (Addendum)
ANTICOAGULATION CONSULT NOTE - Initial Consult  Pharmacy Consult for Heparin Indication: pulmonary embolus  Allergies  Allergen Reactions  . Latex Swelling    Patient Measurements: Height: 5\' 10"  (177.8 cm) Weight: 230 lb 12.8 oz (104.7 kg) IBW/kg (Calculated) : 73 Heparin Dosing Weight: 95.3 kg  Vital Signs: Temp: 98.4 F (36.9 C) (09/10 1237) Temp Source: Oral (09/10 1237) BP: 189/98 (09/10 1237) Pulse Rate: 62 (09/10 1237)  Labs: Recent Labs    06/09/19 1453  HGB 15.2  HCT 45.9  PLT 203.0    CrCl cannot be calculated (Patient's most recent lab result is older than the maximum 21 days allowed.).   Medical History: Past Medical History:  Diagnosis Date  . CAD (coronary artery disease)   . Erectile dysfunction   . Hematospermia   . Hyperlipidemia   . Hypertension 2014  . Overweight(278.02)   . Prolactin-secreting pituitary adenoma [facility]) 1999   Medical therapy-1999    Medications:  Scheduled:  . heparin  6,500 Units Intravenous Once   Infusions:  . heparin      Assessment: 63 y.o. male presenting to ED after having CTA of chest with positive bilateral PEs with right heart strain. PMH includes CAD, ischemic cardiomyopathy, HTN, HLD. No anticoagulation PTA. Hgb 15.2, Plts 203. Pharmacy consulted for heparin dosing.   Goal of Therapy:  Heparin level 0.3-0.7 units/ml Monitor platelets by anticoagulation protocol: Yes   Plan:  Heparin bolus 6500 units Heparin gtt 1700 units/hr Check 6hr initial level Daily INR and CBC Monitor s/sx of bleeding  Lorel Monaco, PharmD PGY1 Ambulatory Care Resident [employer] # [phone]

## 2019-06-10 NOTE — Telephone Encounter (Signed)
[place]:  Patient's d-dimer is elevated. Will need to evaluate for pulmonary embolism as a cause for dyspnea  Please cancel the high-resolution CT and order a CT angiogram.   Pt aware

## 2019-06-11 ENCOUNTER — Inpatient Hospital Stay (HOSPITAL_COMMUNITY): Payer: Medicare Other

## 2019-06-11 DIAGNOSIS — I2699 Other pulmonary embolism without acute cor pulmonale: Secondary | ICD-10-CM

## 2019-06-11 LAB — BASIC METABOLIC PANEL
Anion gap: 7 (ref 5–15)
BUN: 14 mg/dL (ref 8–23)
CO2: 24 mmol/L (ref 22–32)
Calcium: 9.5 mg/dL (ref 8.9–10.3)
Chloride: 107 mmol/L (ref 98–111)
Creatinine, Ser: 1.64 mg/dL — ABNORMAL HIGH (ref 0.61–1.24)
GFR calc Af Amer: 50 mL/min — ABNORMAL LOW (ref >60)
GFR calc non Af Amer: 43 mL/min — ABNORMAL LOW (ref >60)
Glucose, Bld: 80 mg/dL (ref 70–99)
Potassium: 4.3 mmol/L (ref 3.5–5.1)
Sodium: 138 mmol/L (ref 135–145)

## 2019-06-11 LAB — CBC
HCT: 42.4 % (ref 39.0–52.0)
Hemoglobin: 13.7 g/dL (ref 13.0–17.0)
MCH: 31.6 pg (ref 26.0–34.0)
MCHC: 32.3 g/dL (ref 30.0–36.0)
MCV: 97.7 fL (ref 80.0–100.0)
Platelets: 182 10*3/uL (ref 150–400)
RBC: 4.34 MIL/uL (ref 4.22–5.81)
RDW: 13.4 % (ref 11.5–15.5)
WBC: 5.9 10*3/uL (ref 4.0–10.5)
nRBC: 0 % (ref 0.0–0.2)

## 2019-06-11 LAB — HEPARIN LEVEL (UNFRACTIONATED)
Heparin Unfractionated: 0.67 IU/mL (ref 0.30–0.70)
Heparin Unfractionated: 0.66 IU/mL (ref 0.30–0.70)

## 2019-06-11 LAB — ANTITHROMBIN III: AntiThromb III Func: 85 % (ref 75–120)

## 2019-06-11 NOTE — Progress Notes (Signed)
[place] for Heparin Indication: pulmonary embolus  Allergies  Allergen Reactions  . Latex Swelling and Other (See Comments)    Lips swell, but no breathing issues    Patient Measurements: Height: 5\' 10"  (177.8 cm) Weight: 227 lb (103 kg) IBW/kg (Calculated) : 73 Heparin Dosing Weight: 95.3 kg  Vital Signs: Temp: 97.9 F (36.6 C) (09/10 2324) Temp Source: Oral (09/10 2324) BP: 131/87 (09/10 2324) Pulse Rate: 60 (09/10 2324)  Labs: Recent Labs    06/09/19 1453 06/10/19 1340 06/10/19 1458 06/10/19 2014 06/11/19 0439  HGB 15.2 14.9  --   --  13.7  HCT 45.9 46.4  --   --  42.4  PLT 203.0 187  --   --  182  HEPARINUNFRC  --   --   --  1.01* 0.67  CREATININE  --  1.45*  --   --   --   TROPONINIHS  --  12 12  --   --     Estimated Creatinine Clearance: 61.1 mL/min (A) (by C-G formula based on SCr of 1.45 mg/dL (H)).   Medical History: Past Medical History:  Diagnosis Date  . CAD (coronary artery disease)   . Erectile dysfunction   . Hematospermia   . Hyperlipidemia   . Hypertension 2014  . Overweight(278.02)   . Prolactin-secreting pituitary adenoma [facility]) 1999   Medical therapy-1999    Medications:  Scheduled:  . bromocriptine  1.25 mg Oral QHS  . buPROPion  150 mg Oral QHS  . cholecalciferol  1,000 Units Oral QHS  . famotidine  20 mg Oral BID  . isosorbide dinitrate  30 mg Oral BID  . metoprolol succinate  100 mg Oral Daily  . multivitamin with minerals  1 tablet Oral QHS  . sodium chloride flush  3 mL Intravenous Q12H  . vitamin B-12  1,000 mcg Oral QHS   Infusions:  . sodium chloride    . heparin 1,400 Units/hr (06/10/19 2315)    Assessment: 63 y.o. male presenting to ED after having CTA of chest with positive bilateral PEs with right heart strain. PMH includes CAD, ischemic cardiomyopathy, HTN, HLD. No anticoagulation PTA. Pharmacy consulted for heparin dosing.   9/11 AM update:  Heparin level is  therapeutic this AM after rate decrease  Goal of Therapy:  Heparin level 0.3-0.7 units/ml Monitor platelets by anticoagulation protocol: Yes   Plan:  -Cont heparin drip at 1400 units/hr -Confirmatory heparin level at [place], PharmD, [place] Pharmacist Phone: [phone]

## 2019-06-11 NOTE — Progress Notes (Signed)
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  He was admitted 1 month ago to the hospital for anaphylactic reaction and was treated with a steroid.  Patient was following with Dr. Tammi Klippel day prior to admission and had lab drawn and d-dimer was elevated at 4.4, CT angiogram was done showing bilateral pulmonary emboli patient was a started on heparin drip.   Assessment & Plan:   Active Problems:   Pulmonary embolism (HCC)   1-Bilateral pulmonary emboli with right ventricular strain: CT angio bilatreal PE, question clot IVC.  Evaluated by cardiology and CCM. Echocardiogram showed moderate right ventricular dysfunction, I have ask CCM to comment on further  plan of treatment. Continue with heparin drip for at least 48 hours. Follow Dopplers of lower extremity. We will check hypercoagulable panel, he will need protein C and S drawn as an outpatient.  History of coronary artery disease: Abnormal EKG and troponin levels likely related to PE. Cardiology following.  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       Estimated body mass index is 32.57 kg/m as calculated from the following:   Height as of this encounter: 5\' 10"  (1.778 m).   Weight as of this encounter: 103 kg.   DVT prophylaxis: Heparin drip Code Status: Full code Family Communication: Discussed with patient Disposition Plan: Remain in the hospital for IV heparin large blood clot burden Consultants:   CCM  Cardiology  Procedures:   Echo: Moderate  right-sided ventricular dysfunction  Doppler  Antimicrobials:  None  Subjective: He report feeling okay denies worsening shortness of breath denies chest pain.  Objective: Vitals:   06/10/19 1952 06/10/19 2324 06/11/19 0530 06/11/19 0820  BP: (!) 148/92 131/87 (!) 141/84 (!) 141/80  Pulse: 65 60 62 64  Resp: 18 16 (!) 21 18  Temp: 98.7 F (37.1 C) 97.9 F (36.6 C) 97.7 F (36.5 C) 97.9 F (36.6 C)  TempSrc: Oral Oral Oral Oral  SpO2: 96% 98% 96% 96%  Weight:      Height:        Intake/Output Summary (Last 24 hours) at 06/11/2019 0842 Last data filed at 06/11/2019 0350 Gross per 24 hour  Intake 184.07 ml  Output --  Net 184.07 ml   Filed Weights   06/10/19 1236 06/10/19 1857  Weight: 104.7 kg 103 kg    Examination:  General exam: Appears calm and comfortable  Respiratory system: Clear to auscultation. Respiratory effort normal. Cardiovascular system: S1 & S2 heard, RRR. No JVD, murmurs, rubs, gallops or clicks. No pedal edema. Gastrointestinal system: Abdomen is nondistended, soft and nontender. No organomegaly or masses felt. Normal bowel sounds heard. Central nervous system: Alert and oriented. No focal neurological deficits. Extremities: Symmetric 5 x 5 power. Skin: No rashes, lesions or ulcers Psychiatry: Judgement and insight appear normal. Mood & affect appropriate.     Data Reviewed: I have personally reviewed following labs and imaging studies  CBC: Recent Labs  Lab 06/09/19 1453 06/10/19 1340 06/11/19 0439  WBC 5.5 5.8 5.9  NEUTROABS 3.1 3.5  --   HGB 15.2 14.9 13.7  HCT 45.9 46.4 42.4  MCV 96.1 98.9 97.7  PLT 203.0 187 [id number]   Basic Metabolic Panel: Recent Labs  Lab 06/10/19 1340  NA 136  K 4.3  CL 107  CO2 22  GLUCOSE 88  BUN 14  CREATININE 1.45*  CALCIUM 9.5   GFR: Estimated Creatinine Clearance: 61.1 mL/min (A) (by C-G formula based on SCr of 1.45 mg/dL (H)). Liver Function Tests: No results for input(s): AST, ALT, ALKPHOS,  BILITOT, PROT, ALBUMIN in the last 168 hours. No results for input(s): LIPASE, AMYLASE in the last 168 hours. No results for input(s): AMMONIA in the last 168 hours. Coagulation Profile: No results for input(s): INR, PROTIME in the last 168 hours. Cardiac Enzymes: No results for input(s): CKTOTAL, CKMB, CKMBINDEX, TROPONINI in the last 168 hours. BNP (last 3 results) Recent Labs    06/09/19 1453  PROBNP 102.0*   HbA1C: No results for input(s): HGBA1C in the last 72 hours. CBG: No results for input(s): GLUCAP in the last 168 hours. Lipid Profile: No results for input(s): CHOL, HDL, LDLCALC, TRIG, CHOLHDL, LDLDIRECT in the last 72 hours. Thyroid Function Tests: No results for input(s): TSH, T4TOTAL, FREET4, T3FREE, THYROIDAB in the last 72 hours. Anemia Panel: No results for input(s): VITAMINB12, FOLATE, FERRITIN, TIBC, IRON, RETICCTPCT in the last 72 hours. Sepsis Labs: No results for input(s): PROCALCITON, LATICACIDVEN in the last 168 hours.  Recent Results (from the past 240 hour(s))  SARS Coronavirus 2 [facility] order, Performed in [facility] hospital lab) Nasopharyngeal Nasopharyngeal Swab     Status: None   Collection Time: 06/10/19  1:10 PM   Specimen: Nasopharyngeal Swab  Result Value Ref Range Status   SARS Coronavirus 2 NEGATIVE NEGATIVE Final    Comment: (NOTE) If result is NEGATIVE SARS-CoV-2 target nucleic acids are NOT DETECTED. The SARS-CoV-2 RNA is generally detectable in upper and lower  respiratory specimens during the acute phase of infection. The lowest  concentration of SARS-CoV-2 viral copies this assay can detect is 250  copies / mL. A negative result does not preclude SARS-CoV-2 infection  and should not be used as the sole basis for treatment or other  patient management decisions.  A negative result may occur with  improper specimen collection / handling, submission of specimen other  than nasopharyngeal swab, presence of viral mutation(s) within the   areas targeted by this assay, and inadequate number of viral copies  (<250 copies / mL). A negative result must be combined with clinical  observations, patient history, and epidemiological information. If result is POSITIVE SARS-CoV-2 target nucleic acids are DETECTED. The SARS-CoV-2 RNA is generally detectable in upper and lower  respiratory specimens dur ing the acute phase of infection.  Positive  results are indicative of active infection with SARS-CoV-2.  Clinical  correlation with patient history and other diagnostic information is  necessary to determine patient infection status.  Positive results do  not rule out bacterial infection or co-infection with other viruses. If result is PRESUMPTIVE POSTIVE SARS-CoV-2 nucleic acids MAY BE PRESENT.   A presumptive positive result was obtained on the submitted specimen  and confirmed on repeat testing.  While 2019 novel coronavirus  (SARS-CoV-2) nucleic acids may be present in the submitted sample  additional confirmatory testing may be necessary for epidemiological  and / or clinical management purposes  to differentiate between  SARS-CoV-2 and other Sarbecovirus currently known to infect humans.  If clinically indicated additional testing with an alternate test  methodology [phone]) is advised. The SARS-CoV-2  RNA is generally  detectable in upper and lower respiratory sp ecimens during the acute  phase of infection. The expected result is Negative. Fact Sheet for Patients:  StrictlyIdeas.no Fact Sheet for Healthcare Providers: BankingDealers.co.za This test is not yet approved or cleared by the [place] FDA and has been authorized for detection and/or diagnosis of SARS-CoV-2 by FDA under an Emergency Use Authorization (EUA).  This EUA will remain in effect (meaning this test can be used) for the duration of the COVID-19 declaration under Section 564(b)(1) of the Act, 21  U.S.C. section 360bbb-3(b)(1), unless the authorization is terminated or revoked sooner. Performed at [facility], [place] [address] [place],  [address]          Radiology Studies: Ct Angio Chest Pe W Or Wo Contrast  Addendum Date: 06/10/2019   ADDENDUM REPORT: 06/10/2019 12:26 ADDENDUM: Critical Value/emergent results were called by telephone at the time of interpretation on 06/10/2019 at 1204 hours to providerPRAVEEN [facility] , who verbally acknowledged these results. Electronically Signed   By: Genevie Ann M.D.   On: 06/10/2019 12:26   Result Date: 06/10/2019 CLINICAL DATA:  63 year old male with shortness of breath, chest pain and abnormal D-dimer. Creatinine was obtained on site at [facility] at [address] Results: Creatinine 1.5 mg/dL. EXAM: CT ANGIOGRAPHY CHEST WITH CONTRAST TECHNIQUE: Multidetector CT imaging of the chest was performed using the standard protocol during bolus administration of intravenous contrast. Multiplanar CT image reconstructions and MIPs were obtained to evaluate the vascular anatomy. CONTRAST:  85mL ISOVUE-370 IOPAMIDOL (ISOVUE-370) INJECTION 76% COMPARISON:  CT chest 09/28/2013. Portable chest 04/22/2019. FINDINGS: Cardiovascular: Good contrast bolus timing in the pulmonary arterial tree. Positive for pulmonary artery thrombus at the left hilum. Clot extends into the left upper lobe and lingula branches (series 5, image 112). Bulky thrombus in the left lower lobe pulmonary artery branches No saddle embolus, but there is also bulky thrombus at the distal right main pulmonary artery extending into the right upper lobe and right lower lobe branches. Additionally there is inflow artifact versus thrombus in the right inferior pulmonary vein on series 5, image 158. RV / LV ratio = 0.9-1. Overall cardiac size is stable since 2014 with no pericardial effusion. Extensive calcified coronary artery atherosclerosis and/or stents. Grossly negative aorta.  Mediastinum/Nodes: No mediastinal lymphadenopathy. Lungs/Pleura: Major airways are patent. No pleural effusion. No pulmonary infarct. No abnormal pulmonary opacity. Upper Abdomen: Difficult to exclude thrombus in the IVC on series 4, image 90, although the IVC has a similar size and caliber to the 2014 comparison. Negative visible liver, gallbladder, spleen, pancreas, adrenal glands and bowel in the upper abdomen. Right upper pole renal cyst with simple fluid density incidentally noted. Musculoskeletal: Bulky and flowing endplate osteophytes throughout the thoracic spine. No acute osseous abnormality identified. Review of the MIP images confirms the above findings. IMPRESSION: 1. Positive for acute pulmonary artery with fairly extensive bilateral thrombus. No saddle embolus, but suspicion of right heart strain on the basis of abnormal RV/LV ratio. 2. No pulmonary infarct or pleural effusion. 3. Questionable thrombus in the IVC and the right inferior pulmonary vein. 4. Calcified coronary artery atherosclerosis. Electronically Signed: By: Genevie Ann M.D. On: 06/10/2019 12:03        Scheduled Meds:  bromocriptine  1.25 mg Oral QHS   buPROPion  150 mg Oral QHS   cholecalciferol  1,000 Units Oral QHS   famotidine  20 mg Oral BID   isosorbide dinitrate  30 mg  Oral BID   metoprolol succinate  100 mg Oral Daily   multivitamin with minerals  1 tablet Oral QHS   sodium chloride flush  3 mL Intravenous Q12H   vitamin B-12  1,000 mcg Oral QHS   Continuous Infusions:  sodium chloride     heparin 1,400 Units/hr (06/10/19 2315)     LOS: 1 day    Time spent: 35 minutes    Elmarie Shiley, MD [facility] Pager [phone]  If 7PM-7AM, please contact night-coverage [email or web address] Password [facility] 06/11/2019, 8:42 AM

## 2019-06-11 NOTE — Progress Notes (Signed)
[place] for Heparin Indication: pulmonary embolus  Allergies  Allergen Reactions  . Latex Swelling and Other (See Comments)    Lips swell, but no breathing issues    Patient Measurements: Height: 5\' 10"  (177.8 cm) Weight: 227 lb (103 kg) IBW/kg (Calculated) : 73 Heparin Dosing Weight: 95.3 kg  Vital Signs: Temp: 98.3 F (36.8 C) (09/11 1146) Temp Source: Oral (09/11 1146) BP: 142/86 (09/11 1146) Pulse Rate: 67 (09/11 1146)  Labs: Recent Labs    06/09/19 1453 06/10/19 1340 06/10/19 1458 06/10/19 2014 06/11/19 0439 06/11/19 1141  HGB 15.2 14.9  --   --  13.7  --   HCT 45.9 46.4  --   --  42.4  --   PLT 203.0 187  --   --  182  --   HEPARINUNFRC  --   --   --  1.01* 0.67 0.66  CREATININE  --  1.45*  --   --  1.64*  --   TROPONINIHS  --  12 12  --   --   --     Estimated Creatinine Clearance: 54 mL/min (A) (by C-G formula based on SCr of 1.64 mg/dL (H)).   Medical History: Past Medical History:  Diagnosis Date  . CAD (coronary artery disease)   . Erectile dysfunction   . Hematospermia   . Hyperlipidemia   . Hypertension 2014  . Overweight(278.02)   . 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 bromocriptine  1.25 mg Oral QHS  . buPROPion  150 mg Oral QHS  . cholecalciferol  1,000 Units Oral QHS  . famotidine  20 mg Oral BID  . isosorbide dinitrate  30 mg Oral BID  . metoprolol succinate  100 mg Oral Daily  . multivitamin with minerals  1 tablet Oral QHS  . sodium chloride flush  3 mL Intravenous Q12H  . vitamin B-12  1,000 mcg Oral QHS   Infusions:  . sodium chloride    . heparin 1,400 Units/hr (06/10/19 2315)    Assessment: 63 y.o. male presenting to ED after having CTA of chest with positive bilateral PEs with right heart strain. PMH includes CAD, ischemic cardiomyopathy, HTN, HLD. No anticoagulation PTA. Pharmacy consulted for heparin dosing.    Confirmatory level therapeutic at 0.66, CBC wnl  Goal of Therapy:  Heparin level 0.3-0.7 units/ml Monitor platelets by anticoagulation protocol: Yes   Plan:  Cont heparin drip at 1400 units/hr Daily heparin level, CBC, s/s bleeding  Bertis Ruddy, PharmD Clinical Pharmacist Please check AMION for all [place] numbers 06/11/2019 12:33 PM

## 2019-06-11 NOTE — Consult Note (Signed)
Patient name: Jesse Coleman Medical record number: [id number] Date of birth: May 10, 1956 Age: 63 y.o. Gender: male PCP: Reynold Bowen, MD  Date: 06/11/2019 Reason for Consult: Pulmonary embolism Referring Physician: Merton Border  HPI: Jesse Coleman is a 64 yo M w/ PMH of HTN, CAD, GERD, HLD who presents with chief complaint of dyspnea. He was in his usual state of health until July 2020 when he was admitted to [name] for anaphylaxis secondary to local tomatoes vs bug bite. He had a short hospitalization stay requiring epinephrine gtt at the time. Since discharge, he states he has been having exertional dyspnea worse than prior. He states he has been working this up in outpatient setting and was told to come to [facility] after his lab drawn at Jesse Coleman's office yesterday showed elevated d-dimer and bilateral pulmonary emboli on CTA. He states he had no recent travel, surgery or prolonged immobilization. He mentions that he has a son who has significant recurrent DVT history but he has been worked up for inheritable coagulopathy without significant findings. He denies any prior tobacco history or leg swelling or calf pain.  S: No events overnight, feels the same  PE:  Temp:  [97.7 F (36.5 C)-98.7 F (37.1 C)] 97.9 F (36.6 C) (09/11 0820) Pulse Rate:  [56-66] 64 (09/11 0820) Resp:  [15-21] 18 (09/11 0820) BP: (128-189)/(74-98) 141/80 (09/11 0820) SpO2:  [94 %-98 %] 96 % (09/11 0820) Weight:  [103 kg-104.7 kg] 103 kg (09/10 1857)  Intake/Output Summary (Last 24 hours) at 06/11/2019 1108 Last data filed at 06/11/2019 1000 Gross per 24 hour  Intake 424.07 ml  Output -  Net 424.07 ml   Physical exam  Gen: Well developed male, NAD HEENT: [place]/AT, PERRL, EOM-I and MMM Neck: supple, ROM intact, no JVD CV: RRR, Nl S1/S2 and -M/R/G Pulm: Decreased bilaterally Abd: Soft, NT, ND and +BS Extm: ROM intact, Peripheral pulses intact, No peripheral edema, No calf pain Skin: Dry, Warm, normal turgor   I reviewed chest CT myself, bilateral PEs noted  Echo noted with RV strain  LAB RESULTS BMET    Component Value Date/Time   NA 138 06/11/2019 0439   K 4.3 06/11/2019 0439   CL 107 06/11/2019 0439   CO2 24 06/11/2019 0439   GLUCOSE 80 06/11/2019 0439   BUN 14 06/11/2019 0439   CREATININE 1.64 (H) 06/11/2019 0439   CREATININE 1.19 01/29/2013 1204   CALCIUM 9.5 06/11/2019 0439   GFRNONAA 43 (L) 06/11/2019 0439   GFRAA 50 (L) 06/11/2019 0439   CBC    Component Value Date/Time   WBC 5.9 06/11/2019 0439   RBC 4.34 06/11/2019 0439   HGB 13.7 06/11/2019 0439   HCT 42.4 06/11/2019 0439   PLT 182 06/11/2019 0439   MCV 97.7 06/11/2019 0439   MCH 31.6 06/11/2019 0439   MCHC 32.3 06/11/2019 0439   RDW 13.4 06/11/2019 0439   LYMPHSABS 1.6 06/10/2019 1340   MONOABS 0.6 06/10/2019 1340   EOSABS 0.1 06/10/2019 1340   BASOSABS 0.0 06/10/2019 1340   ABG    Component Value Date/Time   HCO3 16.9 (L) 04/23/2019 0127   TCO2 18 (L) 04/23/2019 0127   ACIDBASEDEF 7.0 (H) 04/23/2019 0127   O2SAT 87.0 04/23/2019 0127   Radiology CTA Chest personally reviewed: bilateral pulmonary embolism R>L  Assessment and plan:  63 year old male with PMH above presenting with an acute PE.    PE:  - Heparin  - PO anti-coag  - RV strain noted,  no lytics given clinical presentation of RA, no hemodynamic compromise  Hypoxemia:  - Off O2 now  DVT:  - Lower ext doppler check  - Hypocoag work up  PCCM will sign off, please call back if needed.  Rush Farmer, M.D. [facility] Pulmonary/Critical Care Medicine. Pager: [phone]. After hours pager: [phone].

## 2019-06-11 NOTE — Progress Notes (Signed)
[facility] will sign off.   Medication Recommendations:  Per primary team Other recommendations (labs, testing, etc):  NA Follow up as an outpatient:  We will arrange follow up for repeat echo in the future.  Follow up being arranged and message sent to Buford Dresser, MD

## 2019-06-11 NOTE — Progress Notes (Signed)
Bilateral lower extremity venous duplex has been completed. Preliminary results can be found in CV Proc through chart review.   06/11/19 12:31 PM Jesse Coleman RVT

## 2019-06-12 LAB — CARDIOLIPIN ANTIBODIES, IGG, IGM, IGA
Anticardiolipin IgA: <9 APL U/mL (ref 0–11)
Anticardiolipin IgG: <9 GPL U/mL (ref 0–14)
Anticardiolipin IgM: 14 MPL U/mL — ABNORMAL HIGH (ref 0–12)

## 2019-06-12 LAB — BASIC METABOLIC PANEL
Anion gap: 7 (ref 5–15)
BUN: 12 mg/dL (ref 8–23)
CO2: 23 mmol/L (ref 22–32)
Calcium: 9.4 mg/dL (ref 8.9–10.3)
Chloride: 108 mmol/L (ref 98–111)
Creatinine, Ser: 1.45 mg/dL — ABNORMAL HIGH (ref 0.61–1.24)
GFR calc Af Amer: 58 mL/min — ABNORMAL LOW (ref >60)
GFR calc non Af Amer: 50 mL/min — ABNORMAL LOW (ref >60)
Glucose, Bld: 85 mg/dL (ref 70–99)
Potassium: 4.3 mmol/L (ref 3.5–5.1)
Sodium: 138 mmol/L (ref 135–145)

## 2019-06-12 LAB — CBC
HCT: 42.4 % (ref 39.0–52.0)
Hemoglobin: 13.8 g/dL (ref 13.0–17.0)
MCH: 31.7 pg (ref 26.0–34.0)
MCHC: 32.5 g/dL (ref 30.0–36.0)
MCV: 97.2 fL (ref 80.0–100.0)
Platelets: 162 10*3/uL (ref 150–400)
RBC: 4.36 MIL/uL (ref 4.22–5.81)
RDW: 13.4 % (ref 11.5–15.5)
WBC: 5.8 10*3/uL (ref 4.0–10.5)
nRBC: 0 % (ref 0.0–0.2)

## 2019-06-12 LAB — HEPARIN LEVEL (UNFRACTIONATED)
Heparin Unfractionated: 0.76 [IU]/mL — ABNORMAL HIGH (ref 0.30–0.70)
Heparin Unfractionated: 0.47 [IU]/mL (ref 0.30–0.70)
Heparin Unfractionated: 0.6 IU/mL (ref 0.30–0.70)

## 2019-06-12 LAB — BETA-2-GLYCOPROTEIN I ABS, IGG/M/A
Beta-2 Glyco I IgG: <9 GPI IgG units (ref 0–20)
Beta-2-Glycoprotein I IgA: <9 GPI IgA units (ref 0–25)
Beta-2-Glycoprotein I IgM: <9 GPI IgM units (ref 0–32)

## 2019-06-12 LAB — HOMOCYSTEINE: Homocysteine: 16.1 umol/L (ref 0.0–17.2)

## 2019-06-12 NOTE — Progress Notes (Signed)
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  He was admitted 1 month ago to the hospital for anaphylactic reaction and was treated with a steroid.  Patient was following with Dr. Tammi Klippel day prior to admission and had lab drawn and d-dimer was elevated at 4.4, CT angiogram was done showing bilateral pulmonary emboli patient was a started on heparin drip.   Assessment & Plan:   Active Problems:   Pulmonary embolism (HCC)   1-Bilateral pulmonary emboli with right ventricular strain: CT angio bilatreal PE, question clot IVC.  Evaluated by cardiology and CCM. Echocardiogram showed moderate right ventricular dysfunction, I have ask CCM to comment on further  plan of treatment. Continue with heparin drip for at least 48 hours. Dopplers of lower extremity negative for DVT Checking hypercoagulable panel, he will need protein C and S drawn as an outpatient. Labs pending.  Vitals stable, will probably transition to Eliquis tomorrow.   History of coronary artery disease: Abnormal EKG and troponin levels likely related to PE. Cardiology recommend follow ECHO out patient.   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       Estimated body mass index is 32.57 kg/m as calculated from the following:   Height as of this encounter: 5\' 10"  (1.778 m).   Weight as of this encounter: 103 kg.   DVT prophylaxis: Heparin drip Code Status: Full code Family Communication: Discussed with patient Disposition Plan: Remain in the hospital for  IV heparin large blood clot burden Consultants:   CCM  Cardiology  Procedures:   Echo: Moderate right-sided ventricular dysfunction  Doppler  Antimicrobials:  None  Subjective: Dyspnea stable, denies chest pain.   Objective: Vitals:   06/11/19 2000 06/12/19 0009 06/12/19 0453 06/12/19 0845  BP: (!) 163/85 132/82 (!) 146/82 (!) 159/91  Pulse: 70 63 66 64  Resp: 16 14 13 17   Temp: 98.3 F (36.8 C) 97.8 F (36.6 C) 97.7 F (36.5 C) 97.7 F (36.5 C)  TempSrc: Oral Oral Oral Oral  SpO2: 96% 92% 93% 96%  Weight:      Height:        Intake/Output Summary (Last 24 hours) at 06/12/2019 1030 Last data filed at 06/11/2019 1700 Gross per 24 hour  Intake 240 ml  Output --  Net 240 ml   Filed Weights   06/10/19 1236 06/10/19 1857  Weight: 104.7 kg 103 kg    Examination:  General exam: NAD  Respiratory system: CTA Cardiovascular system: S 1, S 2 RRR Gastrointestinal system: BS present, soft, nt Central nervous system: Non focal.  Extremities: Symmetric power.  Skin: no rashes.   Data Reviewed: I have personally reviewed following labs and imaging studies  CBC: Recent Labs  Lab 06/09/19 1453 06/10/19 1340 06/11/19 0439 06/12/19 0325  WBC 5.5 5.8 5.9 5.8  NEUTROABS 3.1 3.5  --   --   HGB 15.2 14.9 13.7 13.8  HCT 45.9 46.4 42.4 42.4  MCV 96.1 98.9 97.7 97.2  PLT 203.0 187 182 0000000   Basic Metabolic Panel: Recent Labs  Lab 06/10/19 1340 06/11/19 0439 06/12/19 0325  NA 136 138 138  K 4.3 4.3 4.3  CL 107 107 108  CO2 22 24 23   GLUCOSE 88 80 85  BUN 14 14 12   CREATININE 1.45* 1.64* 1.45*  CALCIUM 9.5 9.5 9.4   GFR: Estimated Creatinine Clearance: 61.1 mL/min (A) (by C-G formula based on SCr of 1.45 mg/dL (H)). Liver Function Tests: No results for input(s): AST, ALT, ALKPHOS, BILITOT, PROT, ALBUMIN in the last 168 hours. No results for input(s): LIPASE, AMYLASE in the last 168 hours. No results for input(s): AMMONIA in the last 168  hours. Coagulation Profile: No results for input(s): INR, PROTIME in the last 168 hours. Cardiac Enzymes: No results for input(s): CKTOTAL, CKMB, CKMBINDEX, TROPONINI in the last 168 hours. BNP (last 3 results) Recent Labs    06/09/19 1453  PROBNP 102.0*   HbA1C: No results for input(s): HGBA1C in the last 72 hours. CBG: No results for input(s): GLUCAP in the last 168 hours. Lipid Profile: No results for input(s): CHOL, HDL, LDLCALC, TRIG, CHOLHDL, LDLDIRECT in the last 72 hours. Thyroid Function Tests: No results for input(s): TSH, T4TOTAL, FREET4, T3FREE, THYROIDAB in the last 72 hours. Anemia Panel: No results for input(s): VITAMINB12, FOLATE, FERRITIN, TIBC, IRON, RETICCTPCT in the last 72 hours. Sepsis Labs: No results for input(s): PROCALCITON, LATICACIDVEN in the last 168 hours.  Recent Results (from the past 240 hour(s))  SARS Coronavirus 2 [facility] order, Performed in [facility] hospital lab) Nasopharyngeal Nasopharyngeal Swab     Status: None   Collection Time: 06/10/19  1:10 PM   Specimen: Nasopharyngeal Swab  Result Value Ref Range Status   SARS Coronavirus 2 NEGATIVE NEGATIVE Final    Comment: (NOTE) If result is NEGATIVE SARS-CoV-2 target nucleic acids are NOT DETECTED. The SARS-CoV-2 RNA is generally detectable in upper and lower  respiratory specimens during the acute phase of infection. The lowest  concentration of SARS-CoV-2 viral copies this assay can detect is 250  copies / mL. A negative result does not preclude SARS-CoV-2 infection  and should not be used as the sole basis for treatment or other  patient management decisions.  A negative result may occur with  improper specimen collection / handling, submission of specimen other  than nasopharyngeal swab, presence of viral mutation(s) within the  areas targeted by this assay, and inadequate number of viral copies  (<250 copies / mL). A negative result must be combined with clinical  observations,  patient history, and epidemiological information. If result is POSITIVE SARS-CoV-2 target nucleic acids are DETECTED. The SARS-CoV-2 RNA is generally detectable in upper and lower  respiratory specimens dur ing the acute phase of infection.  Positive  results are indicative of active infection with SARS-CoV-2.  Clinical  correlation with patient history and other diagnostic information is  necessary to determine patient infection status.  Positive results do  not rule out bacterial infection or co-infection with other viruses. If result is PRESUMPTIVE POSTIVE SARS-CoV-2 nucleic acids MAY BE PRESENT.   A presumptive positive result was obtained on the submitted specimen  and confirmed on repeat testing.  While 2019 novel coronavirus  (SARS-CoV-2) nucleic acids may be present in the submitted sample  additional confirmatory testing may be necessary for epidemiological  and / or clinical management purposes  to differentiate between  SARS-CoV-2 and other Sarbecovirus currently known to infect humans.  If clinically indicated additional testing with an alternate test  methodology [phone]) is advised. The SARS-CoV-2 RNA is generally  detectable in upper and lower respiratory  sp ecimens during the acute  phase of infection. The expected result is Negative. Fact Sheet for Patients:  StrictlyIdeas.no Fact Sheet for Healthcare Providers: BankingDealers.co.za This test is not yet approved or cleared by the [place] FDA and has been authorized for detection and/or diagnosis of SARS-CoV-2 by FDA under an Emergency Use Authorization (EUA).  This EUA will remain in effect (meaning this test can be used) for the duration of the COVID-19 declaration under Section 564(b)(1) of the Act, 21 U.S.C. section 360bbb-3(b)(1), unless the authorization is terminated or revoked sooner. Performed at [facility], [place] [address], [place],  [place] [address]          Radiology Studies: Ct Angio Chest Pe W Or Wo Contrast  Addendum Date: 06/10/2019   ADDENDUM REPORT: 06/10/2019 12:26 ADDENDUM: Critical Value/emergent results were called by telephone at the time of interpretation on 06/10/2019 at 1204 hours to provider[facility]AVEEN [facility] , who verbally acknowledged these results. Electronically Signed   By: Genevie Ann M.D.   On: 06/10/2019 12:26   Result Date: 06/10/2019 CLINICAL DATA:  63 year old male with shortness of breath, chest pain and abnormal D-dimer. Creatinine was obtained on site at [place] at [address] Results: Creatinine 1.5 mg/dL. EXAM: CT ANGIOGRAPHY CHEST WITH CONTRAST TECHNIQUE: Multidetector CT imaging of the chest was performed using the standard protocol during bolus administration of intravenous contrast. Multiplanar CT image reconstructions and MIPs were obtained to evaluate the vascular anatomy. CONTRAST:  64mL ISOVUE-370 IOPAMIDOL (ISOVUE-370) INJECTION 76% COMPARISON:  CT chest 09/28/2013. Portable chest 04/22/2019. FINDINGS: Cardiovascular: Good contrast bolus timing in the pulmonary arterial tree. Positive for pulmonary artery thrombus at the left hilum. Clot extends into the left upper lobe and lingula branches (series 5, image 112). Bulky thrombus in the left lower lobe pulmonary artery branches No saddle embolus, but there is also bulky thrombus at the distal right main pulmonary artery extending into the right upper lobe and right lower lobe branches. Additionally there is inflow artifact versus thrombus in the right inferior pulmonary vein on series 5, image 158. RV / LV ratio = 0.9-1. Overall cardiac size is stable since 2014 with no pericardial effusion. Extensive calcified coronary artery atherosclerosis and/or stents. Grossly negative aorta. Mediastinum/Nodes: No mediastinal lymphadenopathy. Lungs/Pleura: Major airways are patent. No pleural effusion. No pulmonary infarct. No abnormal pulmonary  opacity. Upper Abdomen: Difficult to exclude thrombus in the IVC on series 4, image 90, although the IVC has a similar size and caliber to the 2014 comparison. Negative visible liver, gallbladder, spleen, pancreas, adrenal glands and bowel in the upper abdomen. Right upper pole renal cyst with simple fluid density incidentally noted. Musculoskeletal: Bulky and flowing endplate osteophytes throughout the thoracic spine. No acute osseous abnormality identified. Review of the MIP images confirms the above findings. IMPRESSION: 1. Positive for acute pulmonary artery with fairly extensive bilateral thrombus. No saddle embolus, but suspicion of right heart strain on the basis of abnormal RV/LV ratio. 2. No pulmonary infarct or pleural effusion. 3. Questionable thrombus in the IVC and the right inferior pulmonary vein. 4. Calcified coronary artery atherosclerosis. Electronically Signed: By: Genevie Ann M.D. On: 06/10/2019 12:03   Vas [place] Lower Extremity Venous (dvt)  Result Date: 06/12/2019  Lower Venous Study Indications: Pulmonary embolism.  Risk Factors: None identified. Comparison Study: No prior studies. Performing Technologist: Oliver Hum RVT  Examination Guidelines: A complete evaluation includes B-mode imaging, spectral Doppler, color Doppler, and power Doppler as needed of all accessible portions of  each vessel. Bilateral testing is considered an integral part of a complete examination. Limited examinations for reoccurring indications may be performed as noted.  +---------+---------------+---------+-----------+----------+--------------+  RIGHT     Compressibility Phasicity Spontaneity Properties Thrombus Aging  +---------+---------------+---------+-----------+----------+--------------+  CFV       Full            Yes       Yes                                    +---------+---------------+---------+-----------+----------+--------------+  SFJ       Full                                                              +---------+---------------+---------+-----------+----------+--------------+  FV Prox   Full                                                             +---------+---------------+---------+-----------+----------+--------------+  FV Mid    Full                                                             +---------+---------------+---------+-----------+----------+--------------+  FV Distal Full                                                             +---------+---------------+---------+-----------+----------+--------------+  PFV       Full                                                             +---------+---------------+---------+-----------+----------+--------------+  POP       Full            Yes       Yes                                    +---------+---------------+---------+-----------+----------+--------------+  PTV       Full                                                             +---------+---------------+---------+-----------+----------+--------------+  PERO      Full                                                             +---------+---------------+---------+-----------+----------+--------------+   +---------+---------------+---------+-----------+----------+--------------+  LEFT      Compressibility Phasicity Spontaneity Properties Thrombus Aging  +---------+---------------+---------+-----------+----------+--------------+  CFV       Full            Yes       Yes                                    +---------+---------------+---------+-----------+----------+--------------+  SFJ       Full                                                             +---------+---------------+---------+-----------+----------+--------------+  FV Prox   Full                                                             +---------+---------------+---------+-----------+----------+--------------+  FV Mid    Full                                                              +---------+---------------+---------+-----------+----------+--------------+  FV Distal Full                                                             +---------+---------------+---------+-----------+----------+--------------+  PFV       Full                                                             +---------+---------------+---------+-----------+----------+--------------+  POP       Full            Yes       Yes                                    +---------+---------------+---------+-----------+----------+--------------+  PTV       Full                                                             +---------+---------------+---------+-----------+----------+--------------+  PERO      Full                                                             +---------+---------------+---------+-----------+----------+--------------+  Summary: Right: There is no evidence of deep vein thrombosis in the lower extremity. No cystic structure found in the popliteal fossa. Left: There is no evidence of deep vein thrombosis in the lower extremity. No cystic structure found in the popliteal fossa.  *See table(s) above for measurements and observations. Electronically signed by Servando Snare MD on 06/12/2019 at 12:15:11 AM.    Final         Scheduled Meds:  bromocriptine  1.25 mg Oral QHS   buPROPion  150 mg Oral QHS   cholecalciferol  1,000 Units Oral QHS   famotidine  20 mg Oral BID   isosorbide dinitrate  30 mg Oral BID   metoprolol succinate  100 mg Oral Daily   multivitamin with minerals  1 tablet Oral QHS   sodium chloride flush  3 mL Intravenous Q12H   vitamin B-12  1,000 mcg Oral QHS   Continuous Infusions:  sodium chloride     heparin 1,250 Units/hr (06/12/19 0424)     LOS: 2 days    Time spent: 35 minutes    Elmarie Shiley, MD [facility] Pager [phone]  If 7PM-7AM, please contact night-coverage [email or web address] Password [email or web address] 06/12/2019, 10:30 AM

## 2019-06-12 NOTE — Progress Notes (Signed)
[place] for Heparin Indication: pulmonary embolus  Allergies  Allergen Reactions  . Latex Swelling and Other (See Comments)    Lips swell, but no breathing issues    Patient Measurements: Height: 5\' 10"  (177.8 cm) Weight: 227 lb (103 kg) IBW/kg (Calculated) : 73 Heparin Dosing Weight: 95.3 kg  Vital Signs: Temp: 97.8 F (36.6 C) (09/12 0009) Temp Source: Oral (09/12 0009) BP: 132/82 (09/12 0009) Pulse Rate: 63 (09/12 0009)  Labs: Recent Labs    06/10/19 1340 06/10/19 1458  06/11/19 0439 06/11/19 1141 06/12/19 0325  HGB 14.9  --   --  13.7  --  13.8  HCT 46.4  --   --  42.4  --  42.4  PLT 187  --   --  182  --  162  HEPARINUNFRC  --   --    < > 0.67 0.66 0.76*  CREATININE 1.45*  --   --  1.64*  --   --   TROPONINIHS 12 12  --   --   --   --    < > = values in this interval not displayed.    Estimated Creatinine Clearance: 54 mL/min (A) (by C-G formula based on SCr of 1.64 mg/dL (H)).   Medical History: Past Medical History:  Diagnosis Date  . CAD (coronary artery disease)   . Erectile dysfunction   . Hematospermia   . Hyperlipidemia   . Hypertension 2014  . Overweight(278.02)   . 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 bromocriptine  1.25 mg Oral QHS  . buPROPion  150 mg Oral QHS  . cholecalciferol  1,000 Units Oral QHS  . famotidine  20 mg Oral BID  . isosorbide dinitrate  30 mg Oral BID  . metoprolol succinate  100 mg Oral Daily  . multivitamin with minerals  1 tablet Oral QHS  . sodium chloride flush  3 mL Intravenous Q12H  . vitamin B-12  1,000 mcg Oral QHS   Infusions:  . sodium chloride    . heparin 1,400 Units/hr (06/11/19 1830)    Assessment: 63 y.o. male presenting to ED after having CTA of chest with positive bilateral PEs with right heart strain. PMH includes CAD, ischemic cardiomyopathy, HTN, HLD. No anticoagulation PTA. Pharmacy  consulted for heparin dosing.   9/12 AM update:  Heparin level is elevated this AM No issues per RN CBC stable  Goal of Therapy:  Heparin level 0.3-0.7 units/ml Monitor platelets by anticoagulation protocol: Yes   Plan:  -Dec heparin to 1250 units/hr -Re-check heparin level at [place], PharmD, [place] Pharmacist Phone: [phone]

## 2019-06-12 NOTE — Progress Notes (Addendum)
[facility] for Heparin Indication: pulmonary embolus  Allergies  Allergen Reactions  . Latex Swelling and Other (See Comments)    Lips swell, but no breathing issues    Patient Measurements: Height: 5\' 10"  (177.8 cm) Weight: 227 lb (103 kg) IBW/kg (Calculated) : 73 Heparin Dosing Weight: 95.3 kg  Vital Signs: Temp: 98.4 F (36.9 C) (09/12 1225) Temp Source: Oral (09/12 1225) BP: 132/76 (09/12 1225) Pulse Rate: 66 (09/12 1225)  Labs: Recent Labs    06/10/19 1340 06/10/19 1458  06/11/19 0439 06/11/19 1141 06/12/19 0325 06/12/19 1148  HGB 14.9  --   --  13.7  --  13.8  --   HCT 46.4  --   --  42.4  --  42.4  --   PLT 187  --   --  182  --  162  --   HEPARINUNFRC  --   --    < > 0.67 0.66 0.76* 0.47  CREATININE 1.45*  --   --  1.64*  --  1.45*  --   TROPONINIHS 12 12  --   --   --   --   --    < > = values in this interval not displayed.    Estimated Creatinine Clearance: 61.1 mL/min (A) (by C-G formula based on SCr of 1.45 mg/dL (H)).   Medical History: Past Medical History:  Diagnosis Date  . CAD (coronary artery disease)   . Erectile dysfunction   . Hematospermia   . Hyperlipidemia   . Hypertension 2014  . Overweight(278.02)   . 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 bromocriptine  1.25 mg Oral QHS  . buPROPion  150 mg Oral QHS  . cholecalciferol  1,000 Units Oral QHS  . famotidine  20 mg Oral BID  . isosorbide dinitrate  30 mg Oral BID  . metoprolol succinate  100 mg Oral Daily  . multivitamin with minerals  1 tablet Oral QHS  . sodium chloride flush  3 mL Intravenous Q12H  . vitamin B-12  1,000 mcg Oral QHS   Infusions:  . sodium chloride    . heparin 1,250 Units/hr (06/12/19 1226)    Assessment: 63 y.o. male presenting to ED after having CTA of chest with positive bilateral PEs with right heart strain. PMH includes CAD, ischemic  cardiomyopathy, HTN, HLD. No anticoagulation PTA. Pharmacy consulted for heparin dosing.   Heparin level is therapeutic (0.47) on rate on heparin rate of 1,250 units/hr. CBC is stable. No signs/symptoms of bleeding per RN.   Goal of Therapy:  Heparin level 0.3-0.7 units/ml Monitor platelets by anticoagulation protocol: Yes   Plan:  -Continue heparin at 1,250 units/hr -Re-check confirmatory heparin level at 1800 -Monitor daily heparin levels and CBC -Watch for signs/symptoms of bleeding  Sherren Kerns, PharmD PGY1 Acute Care Pharmacy Resident [phone] 1:21 PM 06/12/19

## 2019-06-12 NOTE — Progress Notes (Signed)
[place] for Heparin Indication: pulmonary embolus  Allergies  Allergen Reactions  . Latex Swelling and Other (See Comments)    Lips swell, but no breathing issues    Patient Measurements: Height: 5\' 10"  (177.8 cm) Weight: 227 lb (103 kg) IBW/kg (Calculated) : 73 Heparin Dosing Weight: 95.3 kg  Vital Signs: Temp: 98 F (36.7 C) (09/12 1935) Temp Source: Oral (09/12 1935) BP: 165/84 (09/12 1935) Pulse Rate: 64 (09/12 1935)  Labs: Recent Labs    06/10/19 1340 06/10/19 1458  06/11/19 0439  06/12/19 0325 06/12/19 1148 06/12/19 1821  HGB 14.9  --   --  13.7  --  13.8  --   --   HCT 46.4  --   --  42.4  --  42.4  --   --   PLT 187  --   --  182  --  162  --   --   HEPARINUNFRC  --   --    < > 0.67   < > 0.76* 0.47 0.60  CREATININE 1.45*  --   --  1.64*  --  1.45*  --   --   TROPONINIHS 12 12  --   --   --   --   --   --    < > = values in this interval not displayed.    Estimated Creatinine Clearance: 61.1 mL/min (A) (by C-G formula based on SCr of 1.45 mg/dL (H)).   Medical History: Past Medical History:  Diagnosis Date  . CAD (coronary artery disease)   . Erectile dysfunction   . Hematospermia   . Hyperlipidemia   . Hypertension 2014  . Overweight(278.02)   . 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 bromocriptine  1.25 mg Oral QHS  . buPROPion  150 mg Oral QHS  . cholecalciferol  1,000 Units Oral QHS  . famotidine  20 mg Oral BID  . isosorbide dinitrate  30 mg Oral BID  . metoprolol succinate  100 mg Oral Daily  . multivitamin with minerals  1 tablet Oral QHS  . sodium chloride flush  3 mL Intravenous Q12H  . vitamin B-12  1,000 mcg Oral QHS   Infusions:  . sodium chloride    . heparin 1,250 Units/hr (06/12/19 1226)    Assessment: 63 y.o. male presenting to ED after having CTA of chest with positive bilateral PEs with right heart strain. PMH  includes CAD, ischemic cardiomyopathy, HTN, HLD. No anticoagulation PTA. Pharmacy consulted for heparin dosing.  -heparin level remains at goal   Goal of Therapy:  Heparin level 0.3-0.7 units/ml Monitor platelets by anticoagulation protocol: Yes   Plan:  -Continue heparin at 1,250 units/hr -Monitor daily heparin levels and CBC  Hildred Laser, PharmD Clinical Pharmacist **Pharmacist phone directory can now be found on [email or web address] (PW [email or web address]).  Listed under [place].

## 2019-06-13 LAB — CBC
HCT: 42.6 % (ref 39.0–52.0)
Hemoglobin: 13.9 g/dL (ref 13.0–17.0)
MCH: 31.3 pg (ref 26.0–34.0)
MCHC: 32.6 g/dL (ref 30.0–36.0)
MCV: 95.9 fL (ref 80.0–100.0)
Platelets: 154 10*3/uL (ref 150–400)
RBC: 4.44 MIL/uL (ref 4.22–5.81)
RDW: 13.2 % (ref 11.5–15.5)
WBC: 5.5 10*3/uL (ref 4.0–10.5)
nRBC: 0 % (ref 0.0–0.2)

## 2019-06-13 LAB — HEXAGONAL PHASE PHOSPHOLIPID: Hexagonal Phase Phospholipid: 8 s (ref 0–11)

## 2019-06-13 LAB — HEPARIN LEVEL (UNFRACTIONATED): Heparin Unfractionated: 0.51 IU/mL (ref 0.30–0.70)

## 2019-06-13 LAB — LUPUS ANTICOAGULANT PANEL
DRVVT: 40.3 s (ref 0.0–47.0)
PTT Lupus Anticoagulant: 57.5 s — ABNORMAL HIGH (ref 0.0–51.9)

## 2019-06-13 LAB — PTT-LA MIX: PTT-LA Mix: 49.9 s — ABNORMAL HIGH (ref 0.0–48.9)

## 2019-06-13 MED ORDER — APIXABAN 5 MG PO TABS: 5.0000 mg | ORAL_TABLET | Freq: Two times a day (BID) | ORAL | Status: DC

## 2019-06-13 MED ORDER — APIXABAN 5 MG PO TABS: 10.0000 mg | ORAL_TABLET | Freq: Two times a day (BID) | ORAL | 0 refills | Status: DC

## 2019-06-13 MED ORDER — APIXABAN 5 MG PO TABS: 5.0000 mg | ORAL_TABLET | Freq: Two times a day (BID) | ORAL | 1 refills | Status: DC

## 2019-06-13 MED ORDER — APIXABAN 5 MG PO TABS
10.0000 mg | ORAL_TABLET | Freq: Two times a day (BID) | ORAL | Status: DC
  Administered 2019-06-13: 10 mg via ORAL
  Filled 2019-06-13 (×2): qty 2

## 2019-06-13 NOTE — Discharge Summary (Signed)
Physician Discharge Summary  Jesse Coleman [id number] DOB: 1955-11-01 DOA: 06/10/2019  PCP: Reynold Bowen, MD  Admit date: 06/10/2019 Discharge date: 06/13/2019  Admitted From: Home  Disposition: Home  Recommendations for Outpatient Follow-up:  1. Follow up with PCP in 1-2 weeks 2. Please obtain BMP/CBC in one week 3. Please follow up on the following pending results: Please follow-up hypercoagulable panel, lupus anticoagulant. 4. 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  He was admitted 1 month ago to the hospital for anaphylactic reaction and was treated with a steroid.  Patient was following with Dr. Tammi Klippel day prior to admission and had lab drawn and d-dimer was elevated at 4.4, CT angiogram was done showing bilateral pulmonary emboli patient was a started on heparin drip.  1-Bilateral pulmonary emboli with right ventricular strain: CT angio bilatreal PE, question clot IVC.  -Evaluated by cardiology and CCM. -Echocardiogram showed moderate right ventricular dysfunction, I have ask CCM to comment on further  plan of treatment.  CCM continue with heparin and transition to oral anticoagulation.  No need for thrombolytics, patient vitals stable. -Patient was treated with heparin drip for more than - 48 hours. -Dopplers of lower extremity negative for DVT -Checking hypercoagulable panel, he will need protein C and S drawn as an outpatient. Labs pending.  Anticardiolipin IgM at 14 indeterminate, IgG negative, IgA negative.  Homocystine normal at 16, beta-2  glycol IgG negative.  Lupus anticoagulant pending, factor V Leiden and prothrombin gene mutation pending -Patient was transitioned to Eliquis today.  Plan to discharge later this afternoon if he is stable.  History of coronary artery disease: Abnormal EKG and troponin levels likely related to PE. Cardiology recommend follow ECHO out patient.   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      Discharge Diagnoses:  Active Problems:   Pulmonary embolism [facility])    Discharge Instructions  Discharge Instructions    Diet - low sodium heart healthy   Complete by: As directed    Increase activity slowly   Complete by: As directed      Allergies as of 06/13/2019      Reactions   Latex Swelling, Other (See Comments)   Lips swell, but no breathing issues      Medication List    TAKE these medications   apixaban 5 MG Tabs tablet Commonly known as: ELIQUIS Take 2 tablets (10 mg total) by mouth 2 (two) times daily for 7 days.   apixaban 5 MG Tabs tablet Commonly known as: ELIQUIS Take 1 tablet (5 mg total) by mouth 2 (two) times daily. Start taking on: June 20, 2019   aspirin EC 81 MG tablet Take 81 mg by mouth daily.   bromocriptine 2.5 MG tablet Commonly known as: PARLODEL Take 1.25 mg by mouth at bedtime.   buPROPion 150 MG 24 hr tablet Commonly known as: WELLBUTRIN XL Take 150 mg by mouth at bedtime.   cholecalciferol 25 MCG (1000 UT) tablet Commonly known as: VITAMIN D3 Take 1,000 Units by mouth at bedtime.   EPINEPHrine 0.3  mg/0.3 mL Soaj injection Commonly known as: EPI-PEN Inject 0.3 mLs (0.3 mg total) into the muscle as needed for anaphylaxis.   famotidine 20 MG tablet Commonly known as: PEPCID Take 1 tablet (20 mg total) by mouth 2 (two) times daily.   isosorbide dinitrate 30 MG tablet Commonly known as: ISORDIL Take 30 mg by  mouth 2 (two) times daily.   LORazepam 1 MG tablet Commonly known as: ATIVAN Take 1 mg by mouth every 8 (eight) hours as needed for anxiety.   metoprolol succinate 100 MG 24 hr tablet Commonly known as: TOPROL-XL Take 100 mg by mouth daily. Take with or immediately following a meal.   multivitamin capsule Take 1 capsule by mouth at bedtime.   Nitrostat 0.4 MG SL tablet Generic drug: nitroGLYCERIN Place 0.4 mg under the tongue every 5 (five) minutes as needed for chest pain.   Tylenol 8 Hour Arthritis Pain 650 MG CR tablet Generic drug: acetaminophen Take 650 mg by mouth 2 (two) times daily as needed for pain.   vitamin B-12 1000 MCG tablet Commonly known as: CYANOCOBALAMIN Take 1,000 mcg by mouth at bedtime.       Allergies  Allergen Reactions  . Latex Swelling and Other (See Comments)    Lips swell, but no breathing issues    Consultations:  Cardiology  CCM   Procedures/Studies: Ct Angio Chest Pe W Or Wo Contrast  Addendum Date: 06/10/2019   ADDENDUM REPORT: 06/10/2019 12:26 ADDENDUM: Critical Value/emergent results were called by telephone at the time of interpretation on 06/10/2019 at 1204 hours to [facility] , who verbally acknowledged these results. Electronically Signed   By: Genevie Ann M.D.   On: 06/10/2019 12:26   Result Date: 06/10/2019 CLINICAL DATA:  63 year old male with shortness of breath, chest pain and abnormal D-dimer. Creatinine was obtained on site at [facility] at [address] Results: Creatinine 1.5 mg/dL. EXAM: CT ANGIOGRAPHY CHEST WITH CONTRAST TECHNIQUE: Multidetector CT imaging of the chest was performed using the standard protocol during bolus administration of intravenous contrast. Multiplanar CT image reconstructions and MIPs were obtained to evaluate the vascular anatomy. CONTRAST:  45mL ISOVUE-370 IOPAMIDOL (ISOVUE-370) INJECTION 76% COMPARISON:  CT chest 09/28/2013. Portable chest 04/22/2019. FINDINGS:  Cardiovascular: Good contrast bolus timing in the pulmonary arterial tree. Positive for pulmonary artery thrombus at the left hilum. Clot extends into the left upper lobe and lingula branches (series 5, image 112). Bulky thrombus in the left lower lobe pulmonary artery branches No saddle embolus, but there is also bulky thrombus at the distal right main pulmonary artery extending into the right upper lobe and right lower lobe branches. Additionally there is inflow artifact versus thrombus in the right inferior pulmonary vein on series 5, image 158. RV / LV ratio = 0.9-1. Overall cardiac size is stable since 2014 with no pericardial effusion. Extensive calcified coronary artery atherosclerosis and/or stents. Grossly negative aorta. Mediastinum/Nodes: No mediastinal lymphadenopathy. Lungs/Pleura: Major airways are patent. No pleural effusion. No pulmonary infarct. No abnormal pulmonary opacity. Upper Abdomen: Difficult to exclude thrombus in the IVC on series 4, image 90, although the IVC has a similar size and caliber to the 2014 comparison. Negative visible liver, gallbladder, spleen, pancreas, adrenal glands and bowel in the upper abdomen. Right upper pole renal cyst with simple fluid density incidentally noted. Musculoskeletal: Bulky and flowing endplate osteophytes throughout the thoracic spine. No acute osseous abnormality identified. Review of the MIP images confirms the above findings. IMPRESSION: 1. Positive for acute pulmonary artery  with fairly extensive bilateral thrombus. No saddle embolus, but suspicion of right heart strain on the basis of abnormal RV/LV ratio. 2. No pulmonary infarct or pleural effusion. 3. Questionable thrombus in the IVC and the right inferior pulmonary vein. 4. Calcified coronary artery atherosclerosis. Electronically Signed: By: Genevie Ann M.D. On: 06/10/2019 12:03   Vas [place] Lower Extremity Venous (dvt)  Result Date: 06/12/2019  Lower Venous Study Indications: Pulmonary embolism.   Risk Factors: None identified. Comparison Study: No prior studies. Performing Technologist: Oliver Hum RVT  Examination Guidelines: A complete evaluation includes B-mode imaging, spectral Doppler, color Doppler, and power Doppler as needed of all accessible portions of each vessel. Bilateral testing is considered an integral part of a complete examination. Limited examinations for reoccurring indications may be performed as noted.  +---------+---------------+---------+-----------+----------+--------------+ RIGHT    CompressibilityPhasicitySpontaneityPropertiesThrombus Aging +---------+---------------+---------+-----------+----------+--------------+ CFV      Full           Yes      Yes                                 +---------+---------------+---------+-----------+----------+--------------+ SFJ      Full                                                        +---------+---------------+---------+-----------+----------+--------------+ FV Prox  Full                                                        +---------+---------------+---------+-----------+----------+--------------+ FV Mid   Full                                                        +---------+---------------+---------+-----------+----------+--------------+ FV DistalFull                                                        +---------+---------------+---------+-----------+----------+--------------+ PFV      Full                                                        +---------+---------------+---------+-----------+----------+--------------+ POP      Full           Yes      Yes                                 +---------+---------------+---------+-----------+----------+--------------+ PTV      Full                                                        +---------+---------------+---------+-----------+----------+--------------+  PERO     Full                                                         +---------+---------------+---------+-----------+----------+--------------+   +---------+---------------+---------+-----------+----------+--------------+ LEFT     CompressibilityPhasicitySpontaneityPropertiesThrombus Aging +---------+---------------+---------+-----------+----------+--------------+ CFV      Full           Yes      Yes                                 +---------+---------------+---------+-----------+----------+--------------+ SFJ      Full                                                        +---------+---------------+---------+-----------+----------+--------------+ FV Prox  Full                                                        +---------+---------------+---------+-----------+----------+--------------+ FV Mid   Full                                                        +---------+---------------+---------+-----------+----------+--------------+ FV DistalFull                                                        +---------+---------------+---------+-----------+----------+--------------+ PFV      Full                                                        +---------+---------------+---------+-----------+----------+--------------+ POP      Full           Yes      Yes                                 +---------+---------------+---------+-----------+----------+--------------+ PTV      Full                                                        +---------+---------------+---------+-----------+----------+--------------+ PERO     Full                                                        +---------+---------------+---------+-----------+----------+--------------+  Summary: Right: There is no evidence of deep vein thrombosis in the lower extremity. No cystic structure found in the popliteal fossa. Left: There is no evidence of deep vein thrombosis in the lower extremity. No cystic structure found in the popliteal fossa.   *See table(s) above for measurements and observations. Electronically signed by Servando Snare MD on 06/12/2019 at 12:15:11 AM.    Final    Subjective: He is feeling well, denies chest pain no worsening shortness of breath.  Discharge Exam: Vitals:   06/13/19 0428 06/13/19 0855  BP: (!) 143/84 (!) 148/85  Pulse: 69 62  Resp: 17 16  Temp: 97.8 F (36.6 C)   SpO2: 98% 97%     General: Pt is alert, awake, not in acute distress Cardiovascular: RRR, S1/S2 +, no rubs, no gallops Respiratory: CTA bilaterally, no wheezing, no rhonchi Abdominal: Soft, NT, ND, bowel sounds + Extremities: no edema, no cyanosis    The results of significant diagnostics from this hospitalization (including imaging, microbiology, ancillary and laboratory) are listed below for reference.     Microbiology: Recent Results (from the past 240 hour(s))  SARS Coronavirus 2 [facility] order, Performed in [facility] hospital lab) Nasopharyngeal Nasopharyngeal Swab     Status: None   Collection Time: 06/10/19  1:10 PM   Specimen: Nasopharyngeal Swab  Result Value Ref Range Status   SARS Coronavirus 2 NEGATIVE NEGATIVE Final    Comment: (NOTE) If result is NEGATIVE SARS-CoV-2 target nucleic acids are NOT DETECTED. The SARS-CoV-2 RNA is generally detectable in upper and lower  respiratory specimens during the acute phase of infection. The lowest  concentration of SARS-CoV-2 viral copies this assay can detect is 250  copies / mL. A negative result does not preclude SARS-CoV-2 infection  and should not be used as the sole basis for treatment or other  patient management decisions.  A negative result may occur with  improper specimen collection / handling, submission of specimen other  than nasopharyngeal swab, presence of viral mutation(s) within the  areas targeted by this assay, and inadequate number of viral copies  (<250 copies / mL). A negative result must be combined with clinical  observations, patient  history, and epidemiological information. If result is POSITIVE SARS-CoV-2 target nucleic acids are DETECTED. The SARS-CoV-2 RNA is generally detectable in upper and lower  respiratory specimens dur ing the acute phase of infection.  Positive  results are indicative of active infection with SARS-CoV-2.  Clinical  correlation with patient history and other diagnostic information is  necessary to determine patient infection status.  Positive results do  not rule out bacterial infection or co-infection with other viruses. If result is PRESUMPTIVE POSTIVE SARS-CoV-2 nucleic acids MAY BE PRESENT.   A presumptive positive result was obtained on the submitted specimen  and confirmed on repeat testing.  While 2019 novel coronavirus  (SARS-CoV-2) nucleic acids may be present in the submitted sample  additional confirmatory testing may be necessary for epidemiological  and / or clinical management purposes  to differentiate between  SARS-CoV-2 and other Sarbecovirus currently known to infect humans.  If clinically indicated additional testing with an alternate test  methodology [phone]) is advised. The SARS-CoV-2 RNA is generally  detectable in upper and lower respiratory sp ecimens during the acute  phase of infection. The expected result is Negative. Fact Sheet for Patients:  StrictlyIdeas.no Fact Sheet for Healthcare Providers: BankingDealers.co.za This test is not yet approved or cleared by the [place] FDA and has been authorized for  detection and/or diagnosis of SARS-CoV-2 by FDA under an Emergency Use Authorization (EUA).  This EUA will remain in effect (meaning this test can be used) for the duration of the COVID-19 declaration under Section 564(b)(1) of the Act, 21 U.S.C. section 360bbb-3(b)(1), unless the authorization is terminated or revoked sooner. Performed at [facility], [place] [address] [place], [place] [address]       Labs: BNP (last 3 results) Recent Labs    06/10/19 1340  BNP 0000000*   Basic Metabolic Panel: Recent Labs  Lab 06/10/19 1340 06/11/19 0439 06/12/19 0325  NA 136 138 138  K 4.3 4.3 4.3  CL 107 107 108  CO2 22 24 23   GLUCOSE 88 80 85  BUN 14 14 12   CREATININE 1.45* 1.64* 1.45*  CALCIUM 9.5 9.5 9.4   Liver Function Tests: No results for input(s): AST, ALT, ALKPHOS, BILITOT, PROT, ALBUMIN in the last 168 hours. No results for input(s): LIPASE, AMYLASE in the last 168 hours. No results for input(s): AMMONIA in the last 168 hours. CBC: Recent Labs  Lab 06/09/19 1453 06/10/19 1340 06/11/19 0439 06/12/19 0325 06/13/19 0254  WBC 5.5 5.8 5.9 5.8 5.5  NEUTROABS 3.1 3.5  --   --   --   HGB 15.2 14.9 13.7 13.8 13.9  HCT 45.9 46.4 42.4 42.4 42.6  MCV 96.1 98.9 97.7 97.2 95.9  PLT 203.0 187 182 162 154   Cardiac Enzymes: No results for input(s): CKTOTAL, CKMB, CKMBINDEX, TROPONINI in the last 168 hours. BNP: Invalid input(s): POCBNP CBG: No results for input(s): GLUCAP in the last 168 hours. D-Dimer No results for input(s): DDIMER in the last 72 hours. Hgb A1c No results for input(s): HGBA1C in the last 72 hours. Lipid Profile No results for input(s): CHOL, HDL, LDLCALC, TRIG, CHOLHDL, LDLDIRECT in the last 72 hours. Thyroid function studies No results for input(s): TSH, T4TOTAL, T3FREE, THYROIDAB in the last 72 hours.  Invalid input(s): FREET3 Anemia work up No results for input(s): VITAMINB12, FOLATE, FERRITIN, TIBC, IRON, RETICCTPCT in the last 72 hours. Urinalysis No results found for: COLORURINE, APPEARANCEUR, LABSPEC, [place], GLUCOSEU, [place], [place], KETONESUR, PROTEINUR, UROBILINOGEN, NITRITE, LEUKOCYTESUR Sepsis Labs Invalid input(s): PROCALCITONIN,  WBC,  [place] [facility] Recent Results (from the past 240 hour(s))  SARS Coronavirus 2 [facility] order, Performed in [facility] hospital lab) Nasopharyngeal Nasopharyngeal Swab     Status: None    Collection Time: 06/10/19  1:10 PM   Specimen: Nasopharyngeal Swab  Result Value Ref Range Status   SARS Coronavirus 2 NEGATIVE NEGATIVE Final    Comment: (NOTE) If result is NEGATIVE SARS-CoV-2 target nucleic acids are NOT DETECTED. The SARS-CoV-2 RNA is generally detectable in upper and lower  respiratory specimens during the acute phase of infection. The lowest  concentration of SARS-CoV-2 viral copies this assay can detect is 250  copies / mL. A negative result does not preclude SARS-CoV-2 infection  and should not be used as the sole basis for treatment or other  patient management decisions.  A negative result may occur with  improper specimen collection / handling, submission of specimen other  than nasopharyngeal swab, presence of viral mutation(s) within the  areas targeted by this assay, and inadequate number of viral copies  (<250 copies / mL). A negative result must be combined with clinical  observations, patient history, and epidemiological information. If result is POSITIVE SARS-CoV-2 target nucleic acids are DETECTED. The SARS-CoV-2 RNA is generally detectable in upper and lower  respiratory specimens dur ing  the acute phase of infection.  Positive  results are indicative of active infection with SARS-CoV-2.  Clinical  correlation with patient history and other diagnostic information is  necessary to determine patient infection status.  Positive results do  not rule out bacterial infection or co-infection with other viruses. If result is PRESUMPTIVE POSTIVE SARS-CoV-2 nucleic acids MAY BE PRESENT.   A presumptive positive result was obtained on the submitted specimen  and confirmed on repeat testing.  While 2019 novel coronavirus  (SARS-CoV-2) nucleic acids may be present in the submitted sample  additional confirmatory testing may be necessary for epidemiological  and / or clinical management purposes  to differentiate between  SARS-CoV-2 and other Sarbecovirus  currently known to infect humans.  If clinically indicated additional testing with an alternate test  methodology [phone]) is advised. The SARS-CoV-2 RNA is generally  detectable in upper and lower respiratory sp ecimens during the acute  phase of infection. The expected result is Negative. Fact Sheet for Patients:  StrictlyIdeas.no Fact Sheet for Healthcare Providers: BankingDealers.co.za This test is not yet approved or cleared by the [place] FDA and has been authorized for detection and/or diagnosis of SARS-CoV-2 by FDA under an Emergency Use Authorization (EUA).  This EUA will remain in effect (meaning this test can be used) for the duration of the COVID-19 declaration under Section 564(b)(1) of the Act, 21 U.S.C. section 360bbb-3(b)(1), unless the authorization is terminated or revoked sooner. Performed at [facility], [place] [address], [place], [place] [address]      Time coordinating discharge: 40 minutes  SIGNED:   Elmarie Shiley, MD  [facility]

## 2019-06-13 NOTE — Discharge Instructions (Signed)
Information on my medicine - ELIQUIS (apixaban)  This medication education was reviewed with me or my healthcare representative as part of my discharge preparation.  The pharmacist that spoke with me during my hospital stay was:  Werner Lean, [facility], [facility]  Why was Eliquis prescribed for you? Eliquis was prescribed to treat blood clots that may have been found in the veins of your legs (deep vein thrombosis) or in your lungs (pulmonary embolism) and to reduce the risk of them occurring again.  What do You need to know about Eliquis ? The starting dose is 10 mg (two 5 mg tablets) taken TWICE daily for the FIRST SEVEN (7) DAYS, then on (06/20/19)  the dose is reduced to ONE 5 mg tablet taken TWICE daily.  Eliquis may be taken with or without food.   Try to take the dose about the same time in the morning and in the evening. If you have difficulty swallowing the tablet whole please discuss with your pharmacist how to take the medication safely.  Take Eliquis exactly as prescribed and DO NOT stop taking Eliquis without talking to the doctor who prescribed the medication.  Stopping may increase your risk of developing a new blood clot.  Refill your prescription before you run out.  After discharge, you should have regular check-up appointments with your healthcare provider that is prescribing your Eliquis.    What do you do if you miss a dose? If a dose of ELIQUIS is not taken at the scheduled time, take it as soon as possible on the same day and twice-daily administration should be resumed. The dose should not be doubled to make up for a missed dose.  Important Safety Information A possible side effect of Eliquis is bleeding. You should call your healthcare provider right away if you experience any of the following: ? Bleeding from an injury or your nose that does not stop. ? Unusual colored urine (red or dark brown) or unusual colored stools (red or black). ? Unusual bruising for unknown  reasons. ? A serious fall or if you hit your head (even if there is no bleeding).  Some medicines may interact with Eliquis and might increase your risk of bleeding or clotting while on Eliquis. To help avoid this, consult your healthcare provider or pharmacist prior to using any new prescription or non-prescription medications, including herbals, vitamins, non-steroidal anti-inflammatory drugs (NSAIDs) and supplements.  This website has more information on Eliquis (apixaban): http://www.eliquis.com/eliquis/home

## 2019-06-13 NOTE — Progress Notes (Signed)
Patient given discharge instructions medication list and follow up appointments given to patient. Reviewed Eliquis instructions with patient and patient verbalized understanding. IV and tele dcd will discharge home as ordered and transported to exit with nursing staff and wheel chair. Jaqwon Manfred, Bettina Gavia RN

## 2019-06-13 NOTE — Progress Notes (Signed)
[place] for Heparin Indication: pulmonary embolus  Allergies  Allergen Reactions  . Latex Swelling and Other (See Comments)    Lips swell, but no breathing issues    Patient Measurements: Height: 5\' 10"  (177.8 cm) Weight: 227 lb (103 kg) IBW/kg (Calculated) : 73  Vital Signs: Temp: 97.8 F (36.6 C) (09/13 0428) Temp Source: Oral (09/13 0428) BP: 143/84 (09/13 0428) Pulse Rate: 69 (09/13 0428)  Labs: Recent Labs    06/10/19 1340 06/10/19 1458  06/11/19 0439  06/12/19 0325 06/12/19 1148 06/12/19 1821 06/13/19 0254  HGB 14.9  --   --  13.7  --  13.8  --   --  13.9  HCT 46.4  --   --  42.4  --  42.4  --   --  42.6  PLT 187  --   --  182  --  162  --   --  154  HEPARINUNFRC  --   --    < > 0.67   < > 0.76* 0.47 0.60 0.51  CREATININE 1.45*  --   --  1.64*  --  1.45*  --   --   --   TROPONINIHS 12 12  --   --   --   --   --   --   --    < > = values in this interval not displayed.    Estimated Creatinine Clearance: 61.1 mL/min (A) (by C-G formula based on SCr of 1.45 mg/dL (H)).   Medical History: Past Medical History:  Diagnosis Date  . CAD (coronary artery disease)   . Erectile dysfunction   . Hematospermia   . Hyperlipidemia   . Hypertension 2014  . Overweight(278.02)   . 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 apixaban  10 mg Oral BID   Followed by  . [START ON 06/20/2019] apixaban  5 mg Oral BID  . bromocriptine  1.25 mg Oral QHS  . buPROPion  150 mg Oral QHS  . cholecalciferol  1,000 Units Oral QHS  . famotidine  20 mg Oral BID  . isosorbide dinitrate  30 mg Oral BID  . metoprolol succinate  100 mg Oral Daily  . multivitamin with minerals  1 tablet Oral QHS  . sodium chloride flush  3 mL Intravenous Q12H  . vitamin B-12  1,000 mcg Oral QHS   Infusions:  . sodium chloride      Assessment: 63 y.o. male presenting to ED after having CTA of  chest with positive bilateral PEs with right heart strain. PMH includes CAD, ischemic cardiomyopathy, HTN, HLD. No anticoagulation PTA. Pharmacy consulted for apixaban dosing.   Pt was on heparin, which was therapeutic for the last 3 days. Patient now transitioning to apixaban. Hg/hct and plts are all wnls.   Goal of Therapy:  Monitor platelets by anticoagulation protocol: Yes   Plan:  -Start apixaban 10 mg BID x 7 days, then apixaban 5 mg BID -Monitor CBC as needed -Watch for signs/symptoms of bleeding -Orders placed, pharmacy will D/C consult since no dose adjustment is anticipated.. Call back if needed.   Sherren Kerns, PharmD PGY1 Acute Care Pharmacy Resident [phone] 8:51 AM 06/13/19

## 2019-06-13 NOTE — Progress Notes (Signed)
Pt wallked 400 feet in hallway with o2 sating at 97% on room air. Patient breathing comfortably and is back in his room.

## 2019-06-13 NOTE — Care Management (Signed)
Patient's copay for Eliquis is $10 for the 7 day prescription.  Patient given free 30 day card to use this week or next week when he fills the 30 day prescription.  Eliquis is in stock at pharmacy.

## 2019-06-16 LAB — PROTHROMBIN GENE MUTATION

## 2019-06-18 LAB — FACTOR 5 LEIDEN

## 2019-06-21 ENCOUNTER — Telehealth: Payer: Self-pay

## 2019-06-21 NOTE — Telephone Encounter (Signed)
He did have a CT scheduled for tomorrow at [facility] but it was canceled and in note for reason it says "per provider".  Please let pt know with MyChart message.

## 2019-06-21 NOTE — Telephone Encounter (Signed)
Error.  Will document under message.

## 2019-06-21 NOTE — Telephone Encounter (Signed)
Per CT Note:  Cx per Sonja-pt is have CT angio 9/10.   I spoke to pt & let him know this CT was Cx b/c he had CT Angio on 9/10.  Pt verbalized understanding & stated nothing further needed at this time.

## 2019-06-21 NOTE — Telephone Encounter (Signed)
PCCs can you help with patient's email below?  I don't see this in his chart but you all do that type of scheduling so I defer to you.  Thank you for your help!  "Dr. Vaughan Browner, Do I have an appointment for a CT Scan Wednesday Sept. 23, 2020 4:00 pm  at [facility]? I have a note on my personal calendar but not in [facility] [place]. Can you confirm this appointment. Thank you, Camil Trembath"

## 2019-06-23 ENCOUNTER — Ambulatory Visit (HOSPITAL_COMMUNITY): Payer: Medicare Other

## 2019-06-30 ENCOUNTER — Other Ambulatory Visit: Payer: Self-pay

## 2019-06-30 ENCOUNTER — Ambulatory Visit: Payer: Medicare Other | Admitting: Pulmonary Disease

## 2019-06-30 ENCOUNTER — Encounter: Payer: Self-pay | Admitting: Pulmonary Disease

## 2019-06-30 VITALS — BP 124/70 | HR 67 | Temp 98.0°F | Ht 70.0 in | Wt 232.8 lb

## 2019-06-30 DIAGNOSIS — I2699 Other pulmonary embolism without acute cor pulmonale: Secondary | ICD-10-CM

## 2019-06-30 DIAGNOSIS — Z86711 Personal history of pulmonary embolism: Secondary | ICD-10-CM

## 2019-06-30 NOTE — Addendum Note (Signed)
Addended by: Hildred Alamin I on: 06/30/2019 02:47 PM   Modules accepted: Orders

## 2019-06-30 NOTE — Progress Notes (Signed)
Jesse Coleman    WI:[id number]    [birth year]  Primary Care Physician:South, Annie Main, MD  Referring Physician: Reynold Bowen, [address] [place],  [place] [address]  Chief complaint: Follow-up for pulmonary embolism  HPI: 63 year old with history of hypertension, coronary artery disease, hyperlipidemia.  Complains of dyspnea for the past 3 to 4 years.  Reports an aspiration event about 4 years ago before onset of symptoms.  Has occasional hoarseness of voice, wheezing, chest congestion.  Denies any cough  He was evaluated at [facility] a couple of years ago and was thought he had acid reflux.  He does have occasional heartburn symptoms but is well controlled on antiacid medication.  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  He was on epinephrine drip at that time.  Noted to have chest pain with elevated troponins and evaluated by cardiology for coronary artery disease which is treated medically.    Pets: Has a dog.  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  Currently works as a Cabin crew Exposures: No known exposures.  No mold, hot tub, Jacuzzi Smoking history: Never smoker Travel history: Previously lived in [place].  Moved to [place] around 2010.  No significant recent travel Relevant family history: No significant family history of lung disease.  Interim history: Sent to the ED after a CTA showed large pulmonary embolism Treated with heparin anticoagulation and transition to Eliquis. PCCM consulted and it was felt that he did not need thrombolytics or EKOS anticoagulation  He feels much better now.  Dyspnea has improved and has started exercising again.  Outpatient Encounter Medications as of 06/30/2019  Medication Sig  . acetaminophen (TYLENOL 8 HOUR ARTHRITIS PAIN) 650 MG CR tablet Take 650 mg by mouth 2 (two) times daily as needed for pain.   Marland Kitchen apixaban (ELIQUIS) 5 MG TABS tablet Take 1  tablet (5 mg total) by mouth 2 (two) times daily.  Marland Kitchen aspirin EC 81 MG tablet Take 81 mg by mouth daily.  . bromocriptine (PARLODEL) 2.5 MG tablet Take 1.25 mg by mouth at bedtime.   Marland Kitchen buPROPion (WELLBUTRIN XL) 150 MG 24 hr tablet Take 150 mg by mouth at bedtime.   . cholecalciferol (VITAMIN D3) 25 MCG (1000 UT) tablet Take 1,000 Units by mouth at bedtime.   Marland Kitchen EPINEPHrine 0.3 mg/0.3 mL IJ SOAJ injection Inject 0.3 mLs (0.3 mg total) into the muscle as needed for anaphylaxis.  . famotidine (PEPCID) 20 MG tablet Take 1 tablet (20 mg total) by mouth 2 (two) times daily.  . isosorbide dinitrate (ISORDIL) 30 MG tablet Take 30 mg by mouth 2 (two) times daily.   Marland Kitchen LORazepam (ATIVAN) 1 MG tablet Take 1 mg by mouth every 8 (eight) hours as needed for anxiety.   . metoprolol succinate (TOPROL-XL) 100 MG 24 hr tablet Take 100 mg by mouth daily. Take with or immediately following a meal.  . Multiple Vitamin (MULTIVITAMIN) capsule Take 1 capsule by mouth at bedtime.   Marland Kitchen NITROSTAT 0.4 MG SL tablet Place 0.4 mg under the tongue every 5 (five) minutes as needed for chest pain.   . vitamin B-12 (CYANOCOBALAMIN) 1000 MCG tablet Take 1,000 mcg by mouth at bedtime.   . [DISCONTINUED] apixaban (ELIQUIS) 5 MG TABS tablet Take 2 tablets (10 mg total) by mouth 2 (two) times daily for 7 days.   No facility-administered encounter medications on file as of 06/30/2019.  Physical Exam: Blood pressure 124/70, pulse 67, temperature 98 F (36.7 C), temperature source Temporal, height 5\' 10"  (1.778 m), weight 232 lb 12.8 oz (105.6 kg), SpO2 98 %. Gen:      No acute distress HEENT:  EOMI, sclera anicteric Neck:     No masses; no thyromegaly Lungs:    Clear to auscultation bilaterally; normal respiratory effort CV:         Regular rate and rhythm; no murmurs Abd:      + bowel sounds; soft, non-tender; no palpable masses, no distension Ext:    No edema; adequate peripheral perfusion Skin:      Warm and dry; no rash Neuro:  alert and oriented x 3 Psych: normal mood and affect  Data Reviewed: Imaging: CT chest 09/28/2013- no pulmonary abnormalities, coronary atherosclerosis Chest x-ray 04/22/2019- no active cardiopulmonary disease CTA 06/10/2019- pulmonary embolism with extensive bilateral thrombus.  Questionable thrombus in the IVC and right inferior pulmonary vein. I have reviewed the images personally  Lower extremity ultrasound 06/01/2019-no DVT  Labs: CBC 06/09/2019-WBC 5.5, eos 1.9%, absolute eosinophil count 104 CBC 06/10/2019-WBC 5.8, eos 1%, absolute eosinophil count 58 IgE 06/09/2019-204  SARS-CoV-2 04/23/2019-negative  Cardiac: Echocardiogram 06/10/2019- LVEF 55-60%.  Moderate concentric LVH.  RVSP 40  Assessment:  Pulmonary embolism Continue Eliquis for anticoagulation This is not on formulary by his insurance He will get a list of approved anticoagulants and we can change him to that  Hypercoagulable work-up started during last admission so far is negative.  Referral placed to hematology for further evaluation.  Advised him to continue to exercise and get back into shape.  Plan/Recommendations: - Continue Eliquis - Hematology consult  Marshell Garfinkel MD [facility] 06/30/2019, 2:14 PM  CC: Reynold Bowen, MD

## 2019-06-30 NOTE — Patient Instructions (Signed)
I am glad you are better with regard to your breathing Continue the Eliquis medication.  Please check with your insurance company as to which similar medication is on the formulary and we can switch to that We will make a referral to hematologist for evaluation of hypercoagulability Continue to work on exercise at home  Follow-up in 3 months.

## 2019-07-06 ENCOUNTER — Ambulatory Visit: Payer: Medicare Other | Admitting: Cardiology

## 2019-07-06 ENCOUNTER — Encounter: Payer: Self-pay | Admitting: Cardiology

## 2019-07-06 ENCOUNTER — Other Ambulatory Visit: Payer: Self-pay

## 2019-07-06 VITALS — BP 130/80 | HR 68 | Temp 97.1°F | Ht 70.0 in | Wt 234.4 lb

## 2019-07-06 DIAGNOSIS — I2510 Atherosclerotic heart disease of native coronary artery without angina pectoris: Secondary | ICD-10-CM

## 2019-07-06 DIAGNOSIS — I2699 Other pulmonary embolism without acute cor pulmonale: Secondary | ICD-10-CM

## 2019-07-06 DIAGNOSIS — E785 Hyperlipidemia, unspecified: Secondary | ICD-10-CM | POA: Diagnosis not present

## 2019-07-06 DIAGNOSIS — I10 Essential (primary) hypertension: Secondary | ICD-10-CM | POA: Diagnosis not present

## 2019-07-06 DIAGNOSIS — Z7189 Other specified counseling: Secondary | ICD-10-CM | POA: Diagnosis not present

## 2019-07-06 NOTE — Patient Instructions (Signed)

## 2019-07-06 NOTE — Progress Notes (Signed)
Cardiology Office Note:    Date:  07/06/2019   ID:  Jesse Coleman, DOB January 15, 1956, MRN [id number]  PCP:  Reynold Bowen, MD  Cardiologist:  Buford Dresser, MD PhD  Referring MD: Reynold Bowen, MD   ID:[id number]  History of Present Illness:    Jesse Coleman is a 63 y.o. male with a hx of LBBB, CAD (2014), HTN, obesity who is seen for follow up. I met him during a hospitalization for anaphylaxis, for which he was discharged on 04/24/2019. Notes reviewed. He had demand ischemia during this event 2/2 anaphylaxis.  Cardiac history, obtained from the patient and Care Everywhere: -History of chest pain starting 06/2012, progressive. Symptoms were burning chest pain that spread to shoulders, arms, and back. Worse with exertion, better with rest. Associated with shortness of breath and diaphoresis -Echo stress test 01/2013 showed LBBB, EF 40-45% with global hypokinesis.  -Cath 01/2013 showed small caliber CAD in nondominant RCA, diagonal, and OM not amenable to PCI.  -treated with medical therapy (metoprolol, aspirin, atorvastatin, ranolazine). Declined imdur due to interaction with PDE5 inhibitors. Had SL NG, felt terrible when he took this. -amlodipine added by Dr. Baron Hamper at [facility], also recommended medical management. Amlodipine stopped due to LE edema. Metoprolol succinate uptitrated.   Today: Breathing is better. Chest doesn't hurt. Walking around the neighborhood without issue. Walking up and down stairs ok. No PND, orthopnea, LE edema. No syncope.  Has stopped meloxicam, now is very stiff. Tylenol not helpful. Interested in alternatives. Will talk to Dr. Forde Dandy about long acting nerve agent vs. PRN breakthrough meds.   Reviewed his recent hospitalization, discharged 06/13/19 after being found with bilateral PE with RV strain. Son has blood clots in his legs, pending completion of hypercoagulable workup and hematology evaluation.   BP at home 120-155/55-75. O2 has been in the  high 90s.   Past Medical History:  Diagnosis Date  . CAD (coronary artery disease)   . Erectile dysfunction   . Hematospermia   . Hyperlipidemia   . Hypertension 2014  . Overweight(278.02)   . 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 CARDIAC CATHETERIZATION  2014   LAD 40%, D1&D2 70-80% (1.89m), CFX 20%, OM1 & OM2 90% (1.O mm), OM3 50%, RCA 90% (1.5-1.75 mm), EF 40-45%, med rx  . [place]  [address] [place]  [place]    . REFRACTIVE SURGERY Right 2003  . VASECTOMY  2003    Current Medications: Current Outpatient Medications on File Prior to Visit  Medication Sig  . acetaminophen (TYLENOL 8 HOUR ARTHRITIS PAIN) 650 MG CR tablet Take 650 mg by mouth 2 (two) times daily as needed for pain.   .Marland Kitchenapixaban (ELIQUIS) 5 MG TABS tablet Take 1 tablet (5 mg total) by mouth 2 (two) times daily.  .Marland Kitchenaspirin EC 81 MG tablet Take 81 mg by mouth daily.  . bromocriptine (PARLODEL) 2.5 MG tablet Take 1.25 mg by mouth at bedtime.   .Marland KitchenbuPROPion (WELLBUTRIN XL) 150 MG 24 hr tablet Take 150 mg by mouth at bedtime.   . cholecalciferol (VITAMIN D3) 25 MCG (1000 UT) tablet Take 1,000 Units by mouth at bedtime.   .Marland KitchenEPINEPHrine 0.3 mg/0.3 mL IJ SOAJ injection Inject 0.3 mLs (0.3 mg total) into the muscle as needed for anaphylaxis.  . famotidine (  PEPCID) 20 MG tablet Take 1 tablet (20 mg total) by mouth 2 (two) times daily.  . isosorbide dinitrate (ISORDIL) 30 MG tablet Take 30 mg by mouth 2 (two) times daily.   Marland Kitchen LORazepam (ATIVAN) 1 MG tablet Take 1 mg by mouth every 8 (eight) hours as needed for anxiety.   . metoprolol succinate (TOPROL-XL) 100 MG 24 hr tablet Take 100 mg by mouth daily. Take with or immediately following a meal.  . Multiple Vitamin (MULTIVITAMIN) capsule Take 1 capsule by mouth at bedtime.   Marland Kitchen NITROSTAT 0.4  MG SL tablet Place 0.4 mg under the tongue every 5 (five) minutes as needed for chest pain.   . vitamin B-12 (CYANOCOBALAMIN) 1000 MCG tablet Take 1,000 mcg by mouth at bedtime.    No current facility-administered medications on file prior to visit.      Allergies:   Latex   Social History   Tobacco Use  . Smoking status: Never Smoker  . Smokeless tobacco: Never Used  Substance Use Topics  . Alcohol use: Yes    Alcohol/week: 1.0 standard drinks    Types: 1 drink(s) per week  . Drug use: No    Family History: The patient's family history includes Coronary artery disease (age of onset: 35) in his father; Emphysema in his mother.  ROS:   Please see the history of present illness.  Additional pertinent ROS: Constitutional: Negative for chills, fever, night sweats, unintentional weight loss  HENT: Negative for ear pain and hearing loss.   Eyes: Negative for loss of vision and eye pain.  Respiratory: Negative for cough, sputum, wheezing.   Cardiovascular: See HPI. Gastrointestinal: Negative for abdominal pain, melena, and hematochezia.  Genitourinary: Negative for dysuria and hematuria.  Musculoskeletal: Negative for falls and myalgias.  Skin: Negative for itching and rash.  Neurological: Negative for focal weakness, focal sensory changes and loss of consciousness.  Endo/Heme/Allergies: Does not bruise/bleed easily.    EKGs/Labs/Other Studies Reviewed:    The following studies were reviewed today: Echo 06/10/19  1. The left ventricle has normal systolic function, with an ejection fraction of 55-60%. There is moderate concentric left ventricular hypertrophy. Left ventricular diastolic function could not be evaluated.  2. The right ventricle has moderately reduced systolic function. The cavity was moderately enlarged. Right ventricular systolic pressure is mildly elevated with an estimated pressure of 40.3 mmHg.  3. Left atrial size was moderately dilated.  4. Right atrial size  was moderately dilated.  5. Mild thickening of the mitral valve leaflet. No evidence of mitral valve stenosis.  6. Tricuspid valve regurgitation is moderate.  7. The aortic valve is tricuspid Moderate thickening of the aortic valve Moderate calcification of the aortic valve. Aortic valve regurgitation is trivial by color flow Doppler. No stenosis of the aortic valve.  EKG:  EKG is personally reviewed.  The ekg ordered 04/24/2019 demonstrates NSR, LBBB  Recent Labs: 04/23/2019: ALT 10 04/24/2019: Magnesium 2.0 06/09/2019: Pro B Natriuretic peptide (BNP) 102.0 06/10/2019: B Natriuretic Peptide 111.6 06/12/2019: BUN 12; Creatinine, Ser 1.45; Potassium 4.3; Sodium 138 06/13/2019: Hemoglobin 13.9; Platelets 154  Recent Lipid Panel No results found for: CHOL, TRIG, HDL, CHOLHDL, VLDL, LDLCALC, LDLDIRECT  Physical Exam:    VS:  BP 130/80   Pulse 68   Temp (!) 97.1 F (36.2 C)   Ht '5\' 10"'  (1.778 m)   Wt 234 lb 6.4 oz (106.3 kg)   SpO2 99%   BMI 33.63 kg/m     Wt Readings from Last  3 Encounters:  07/06/19 234 lb 6.4 oz (106.3 kg)  06/30/19 232 lb 12.8 oz (105.6 kg)  06/10/19 227 lb (103 kg)    GEN: Well nourished, well developed in no acute distress HEENT: Normal, moist mucous membranes NECK: No JVD CARDIAC: regular rhythm, normal S1 and S2, no rubs or gallops. No murmurs. VASCULAR: Radial and DP pulses 2+ bilaterally. No carotid bruits RESPIRATORY:  Clear to auscultation without rales, wheezing or rhonchi  ABDOMEN: Soft, non-tender, non-distended MUSCULOSKELETAL:  Ambulates independently SKIN: Warm and dry, no edema NEUROLOGIC:  Alert and oriented x 3. No focal neuro deficits noted. PSYCHIATRIC:  Normal affect   ASSESSMENT:    1. Coronary artery disease involving native coronary artery of native heart without angina pectoris   2. Hyperlipidemia LDL goal <70   3. Essential hypertension   4. Counseling on health promotion and disease prevention   5. Other pulmonary embolism without  acute cor pulmonale, unspecified chronicity (HCC)    PLAN:    CAD, with history of refractory angina: now without angina -continue imdur -continue metoprolol succinate -amlodipine stopped while admitted. Currently chest pain free, but consider restarting if angina recurs -on ranexa in the past, unclear why he stopped -counseled that NSAIDs are contraindicated in CAD, including meloxicam. He will discuss alternatives for pain with his PCP. -has sildenafil on med list. Counseled on risk of interaction with SL NG, imdur. Not currently taking.  Ischemic cardiomyopathy: now with recovered EF -continue metoprolol succinate -was on lisinopril in the past. With recovery of EF, will hold on adding for now. Held in hospital given AKI  Hypertension: at goal today, home readings range but many within goal -continue current medications -if needed, consider adding back ACEi/ARB, will need to follow kidney function  Hyperlipidemia: goal LDL <70.  -most recent LDL 121 05/2019 -intolerant of atorvastatin and rosuvastatin in the past (myalgia) -he is not sure why he stopped ezetimibe -nervous re: PCSK9 inhibitors, both cost and side effects -discussed nexletol pros/cons -we will continue to discuss, have discussed referral to PharmD clinic as well  Recent pulmonary embolism: -on apixaban 5 mg BID -having hypercoagulable workup -for now, tolerating aspirin. If will need long term anticoagulation, would stop aspirin.  Cardiac risk counseling and prevention recommendations: -recommend heart healthy/Mediterranean diet, with whole grains, fruits, vegetable, fish, lean meats, nuts, and olive oil. Limit salt. -recommend moderate walking, 3-5 times/week for 30-50 minutes each session. Aim for at least 150 minutes.week. Goal should be pace of 3 miles/hours, or walking 1.5 miles in 30 minutes -recommend avoidance of tobacco products. Avoid excess alcohol.  Plan for follow up: 2 mos to discuss lipids/plan  for anticoagulation  Medication Adjustments/Labs and Tests Ordered: Current medicines are reviewed at length with the patient today.  Concerns regarding medicines are outlined above.  No orders of the defined types were placed in this encounter.  No orders of the defined types were placed in this encounter.   Patient Instructions  Medication Instructions:  Your Physician recommend you continue on your current medication as directed.    If you need a refill on your cardiac medications before your next appointment, please call your pharmacy.   Lab work: None  Testing/Procedures: None  Follow-Up: At [facility], you and your health needs are our priority.  As part of our continuing mission to provide you with exceptional heart care, we have created designated Provider Care Teams.  These Care Teams include your primary Cardiologist (physician) and Advanced Practice Providers (APPs -  Physician  Assistants and Nurse Practitioners) who all work together to provide you with the care you need, when you need it. You will need a follow up appointment in 2 months.  Please call our office 2 months in advance to schedule this appointment.  You may see Buford Dresser, MD or one of the following Advanced Practice Providers on your designated Care Team:   Rosaria Ferries, PA-C . Jory Sims, DNP, ANP       Signed, Buford Dresser, MD PhD 07/06/2019  [facility]

## 2019-07-14 ENCOUNTER — Inpatient Hospital Stay (HOSPITAL_COMMUNITY): Payer: Medicare Other

## 2019-07-14 ENCOUNTER — Inpatient Hospital Stay (HOSPITAL_COMMUNITY): Payer: Medicare Other | Attending: Hematology | Admitting: Hematology

## 2019-07-14 ENCOUNTER — Other Ambulatory Visit: Payer: Self-pay

## 2019-07-14 ENCOUNTER — Encounter (HOSPITAL_COMMUNITY): Payer: Self-pay | Admitting: Hematology

## 2019-07-14 VITALS — BP 135/75 | HR 63 | Temp 97.1°F | Resp 18 | Wt 234.3 lb

## 2019-07-14 DIAGNOSIS — I2699 Other pulmonary embolism without acute cor pulmonale: Secondary | ICD-10-CM | POA: Insufficient documentation

## 2019-07-14 DIAGNOSIS — Z7901 Long term (current) use of anticoagulants: Secondary | ICD-10-CM | POA: Insufficient documentation

## 2019-07-14 DIAGNOSIS — Z7982 Long term (current) use of aspirin: Secondary | ICD-10-CM | POA: Insufficient documentation

## 2019-07-14 LAB — LACTATE DEHYDROGENASE: LDH: 131 U/L (ref 98–192)

## 2019-07-14 NOTE — Progress Notes (Signed)
CONSULT NOTE  Patient Care Team: Reynold Bowen, MD as PCP - General (Endocrinology) Buford Dresser, MD as PCP - Cardiology (Cardiology) Rothbart, Cristopher Estimable, MD as Attending Physician (Cardiology)  CHIEF COMPLAINTS/PURPOSE OF CONSULTATION:  Unprovoked pulmonary embolism.  HISTORY OF PRESENTING ILLNESS:  Jesse Coleman 63 y.o. male is seen in consultation today at the request of Dr. Vaughan Browner for further work-up and management of unprovoked pulmonary embolism.  He was seen by Dr. Vaughan Browner in his office for dyspnea on 06/09/2019 and a CT angiogram was ordered on 06/10/2019 secondary to elevated d-dimer.  This showed acute bilateral extensive pulmonary embolus, with suspicion for right heart strain.  No saddle embolus was seen.  No pulmonary infarct or effusion.  Questionable thrombus in the IVC and right inferior pulmonary vein was seen.  2D echocardiogram on 06/10/2019 shows ejection fraction of 55 to 60%.  Bilateral lower extremity Dopplers were negative.  Hypercoagulable work-up including factor V Leiden, prothrombin gene mutation, Antithrombin III, lupus anticoagulant, anticardiolipin antibody and anti-beta-2 glycoprotein 1 antibody were negative.  He denies any fevers, night sweats or weight loss in the last 6 months.  He reported having an anaphylactic reaction in July lasting for 4 days with swelling of the abdomen and lower extremities.  He lost about 10 pounds since that episode.  Otherwise his appetite has been good.  He has moved to [place] from [place] in 2009 and currently works as a Cabin crew.  He reported colonoscopy in 2007 done in [place] which was within normal limits.  He is a never smoker.  He denies any personal or family history of connective tissue disorders.  No family history of cancers.  Sister had 1 miscarriage in the first trimester.  No prior history of thrombosis.  MEDICAL HISTORY:  Past Medical History:  Diagnosis Date  . CAD (coronary artery disease)   .  Erectile dysfunction   . Hematospermia   . Hyperlipidemia   . Hypertension 2014  . Overweight(278.02)   . 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 CARDIAC CATHETERIZATION  2014   LAD 40%, D1&D2 70-80% (1.22mm), CFX 20%, OM1 & OM2 90% (1.O mm), OM3 50%, RCA 90% (1.5-1.75 mm), EF 40-45%, med rx  . COLONOSCOPY  [address], [place]  . Dental implant  2007  . [place]   [place], [place]  . KNEE SURGERY    . REFRACTIVE SURGERY Right 2003  . VASECTOMY  2003    SOCIAL HISTORY: Social History   Socioeconomic History  . Marital status: Married    Spouse name: Not on file  . Number of children: 2  . Years of education: Not on file  . Highest education level: Not on file  Occupational History  . Occupation: Real estate Scientist, clinical (histocompatibility and immunogenetics): [employer]   Social Needs  . Financial resource strain: Not on file  . Food insecurity    Worry: Not on file    Inability: Not on file  . Transportation needs    Medical: Not on file    Non-medical: Not on file  Tobacco Use  . Smoking status: Never Smoker  . Smokeless tobacco: Never Used  Substance and Sexual Activity  . Alcohol use: Yes    Alcohol/week: 1.0 standard drinks    Types: 1 drink(s) per week  . Drug use: No  . Sexual activity: Not on file  Lifestyle  . Physical activity    Days per week: Not on file    Minutes per session: Not on file  . Stress: Not on file  Relationships  . Social Herbalist on phone: Not on file    Gets together: Not on file    Attends religious service: Not on file    Active member of club or organization: Not on file    Attends meetings of clubs or organizations: Not on file    Relationship status: Not on file  . Intimate partner violence    Fear of current or ex partner: Not on file    Emotionally abused: Not on file    Physically abused: Not on file     Forced sexual activity: Not on file  Other Topics Concern  . Not on file  Social History Narrative  . Not on file    FAMILY HISTORY: Family History  Problem Relation Age of Onset  . Coronary artery disease Father 88       sudden cardiac death  . Emphysema Mother        smoked    ALLERGIES:  is allergic to latex.  MEDICATIONS:  Current Outpatient Medications  Medication Sig Dispense Refill  . acetaminophen (TYLENOL 8 HOUR ARTHRITIS PAIN) 650 MG CR tablet Take 650 mg by mouth 2 (two) times daily as needed for pain.     Marland Kitchen apixaban (ELIQUIS) 5 MG TABS tablet Take 1 tablet (5 mg total) by mouth 2 (two) times daily. 60 tablet 1  . aspirin EC 81 MG tablet Take 81 mg by mouth daily.    . bromocriptine (PARLODEL) 2.5 MG tablet Take 1.25 mg by mouth at bedtime.     Marland Kitchen buPROPion (WELLBUTRIN XL) 150 MG 24 hr tablet Take 150 mg by mouth at bedtime.     . cholecalciferol (VITAMIN D3) 25 MCG (1000 UT) tablet Take 1,000 Units by mouth at bedtime.     Marland Kitchen EPINEPHrine 0.3 mg/0.3 mL IJ SOAJ injection Inject 0.3 mLs (0.3 mg total) into the muscle as needed for anaphylaxis. 1 each 0  . famotidine (PEPCID) 20 MG tablet Take 1 tablet (20 mg total) by mouth 2 (two) times daily. 60 tablet 0  . isosorbide dinitrate (ISORDIL) 30 MG tablet Take 30 mg by mouth 2 (two) times daily.     Marland Kitchen LORazepam (ATIVAN) 1 MG tablet Take 1 mg by mouth every 8 (eight) hours as needed for anxiety.     . metoprolol succinate (TOPROL-XL) 100 MG 24 hr tablet Take 100 mg by mouth daily. Take with or immediately following a meal.    . Multiple Vitamin (MULTIVITAMIN) capsule Take 1 capsule by mouth at bedtime.     Marland Kitchen NITROSTAT 0.4 MG SL tablet Place 0.4 mg under the tongue every 5 (five) minutes as needed for chest pain.     . vitamin B-12 (CYANOCOBALAMIN) 1000 MCG tablet Take 1,000 mcg by mouth at bedtime.      No current facility-administered medications for this visit.     REVIEW OF SYSTEMS:   Constitutional: Denies fevers,  chills or abnormal night sweats Eyes: Denies blurriness of vision, double vision or watery eyes Ears, nose, mouth, throat, and face: Denies mucositis or sore throat Respiratory: Denies cough, dyspnea or wheezes Cardiovascular: Denies palpitation, chest discomfort or lower extremity swelling Gastrointestinal:  Denies nausea, heartburn or change in bowel habits Skin: Denies abnormal skin rashes Lymphatics: Denies new lymphadenopathy or easy bruising Neurological:Denies numbness, tingling  or new weaknesses Behavioral/Psych: Mood is stable, no new changes  All other systems were reviewed with the patient and are negative.  PHYSICAL EXAMINATION: ECOG PERFORMANCE STATUS: 0 - Asymptomatic  Vitals:   07/14/19 1306  BP: 135/75  Pulse: 63  Resp: 18  Temp: (!) 97.1 F (36.2 C)  SpO2: 97%   Filed Weights   07/14/19 1306  Weight: 234 lb 4.8 oz (106.3 kg)    GENERAL:alert, no distress and comfortable SKIN: skin color, texture, turgor are normal, no rashes or significant lesions EYES: normal, conjunctiva are pink and non-injected, sclera clear OROPHARYNX:no exudate, no erythema and lips, buccal mucosa, and tongue normal  NECK: supple, thyroid normal size, non-tender, without nodularity LYMPH:  no palpable lymphadenopathy in the cervical, axillary or inguinal LUNGS: clear to auscultation and percussion with normal breathing effort HEART: regular rate & rhythm and no murmurs and no lower extremity edema ABDOMEN:abdomen soft, non-tender and normal bowel sounds Musculoskeletal:no cyanosis of digits and no clubbing  PSYCH: alert & oriented x 3 with fluent speech NEURO: no focal motor/sensory deficits  LABORATORY DATA:  I have reviewed the data as listed Recent Results (from the past 2160 hour(s))  CBC     Status: Abnormal   Collection Time: 04/22/19 11:29 PM  Result Value Ref Range   WBC 4.2 4.0 - 10.5 K/uL    Comment: QUESTIONABLE RESULTS, RECOMMEND RECOLLECT TO VERIFY RN SARAH  GRINDSTASS AT 0145 ON 04/23/2019 BY MESSAN HOUEGNIFIO    RBC 2.31 (L) 4.22 - 5.81 MIL/uL    Comment: QUESTIONABLE RESULTS, RECOMMEND RECOLLECT TO VERIFY   Hemoglobin 7.5 (L) 13.0 - 17.0 g/dL    Comment: QUESTIONABLE RESULTS, RECOMMEND RECOLLECT TO VERIFY   HCT 24.4 (L) 39.0 - 52.0 %    Comment: QUESTIONABLE RESULTS, RECOMMEND RECOLLECT TO VERIFY   MCV 105.6 (H) 80.0 - 100.0 fL    Comment: QUESTIONABLE RESULTS, RECOMMEND RECOLLECT TO VERIFY   MCH 32.5 26.0 - 34.0 pg    Comment: QUESTIONABLE RESULTS, RECOMMEND RECOLLECT TO VERIFY   MCHC 30.7 30.0 - 36.0 g/dL    Comment: QUESTIONABLE RESULTS, RECOMMEND RECOLLECT TO VERIFY   RDW 13.4 11.5 - 15.5 %    Comment: QUESTIONABLE RESULTS, RECOMMEND RECOLLECT TO VERIFY   Platelets 100 (L) 150 - 400 K/uL    Comment: REPEATED TO VERIFY PLATELET COUNT CONFIRMED BY SMEAR SPECIMEN CHECKED FOR CLOTS Immature Platelet Fraction may be clinically indicated, consider ordering this additional test [id number] QUESTIONABLE RESULTS, RECOMMEND RECOLLECT TO VERIFY    nRBC 0.0 0.0 - 0.2 %    Comment: QUESTIONABLE RESULTS, RECOMMEND RECOLLECT TO VERIFY Performed at [facility], [address] [place], [place] [address]   Comprehensive metabolic panel     Status: Abnormal   Collection Time: 04/22/19 11:29 PM  Result Value Ref Range   Sodium 138 135 - 145 mmol/L    Comment: PLEASE DISREGARD ALL RESULTS DUE TO  THEM BEING QUESTIONABLE. TEST WILL BE CREDITED.    Potassium 3.6 3.5 - 5.1 mmol/L   Chloride 120 (H) 98 - 111 mmol/L   CO2 13 (L) 22 - 32 mmol/L   Glucose, Bld 77 70 - 99 mg/dL   BUN 24 (H) 8 - 23 mg/dL   Creatinine, Ser 1.42 (H) 0.61 - 1.24 mg/dL   Calcium 5.5 (LL) 8.9 - 10.3 mg/dL    Comment: CRITICAL RESULT CALLED TO, READ BACK BY AND VERIFIED WITH: GRINDSTAFF,S RN 04/23/2019 0054 JORDANS    Total Protein 3.1 (L) 6.5 - 8.1  g/dL   Albumin 1.9 (L) 3.5 - 5.0 g/dL   AST 13 (L) 15 - 41 U/L   ALT 7 0 - 44 U/L   Alkaline Phosphatase 25 (L)  38 - 126 U/L   Total Bilirubin 0.8 0.3 - 1.2 mg/dL   GFR calc non Af Amer 52 (L) >60 mL/min   GFR calc Af Amer >60 >60 mL/min   Anion gap 5 5 - 15    Comment: Performed at [facility] [address], [place], [place] [address]  Troponin I (High Sensitivity)     Status: None   Collection Time: 04/22/19 11:29 PM  Result Value Ref Range   Troponin I (High Sensitivity) 2 <18 ng/L    Comment: (NOTE) Elevated high sensitivity troponin I (hsTnI) values and significant  changes across serial measurements may suggest ACS but many other  chronic and acute conditions are known to elevate hsTnI results.  Refer to the Links section for chest pain algorithms and additional  guidance. Performed at [facility], [place] [address], [place], [place] [address]   Troponin I (High Sensitivity)     Status: Abnormal   Collection Time: 04/23/19  1:20 AM  Result Value Ref Range   Troponin I (High Sensitivity) 145 (HH) <18 ng/L    Comment: CRITICAL RESULT CALLED TO, READ BACK BY AND VERIFIED WITH: GRINDSTAFF,S RN 04/23/2019 0213 JORDANS (NOTE) Elevated high sensitivity troponin I (hsTnI) values and significant  changes across serial measurements may suggest ACS but many other  chronic and acute conditions are known to elevate hsTnI results.  Refer to the Links section for chest pain algorithms and additional  guidance. Performed at [facility], [place] [address] [place], [place] [address]   Comprehensive metabolic panel     Status: Abnormal   Collection Time: 04/23/19  1:20 AM  Result Value Ref Range   Sodium 137 135 - 145 mmol/L   Potassium 5.5 (H) 3.5 - 5.1 mmol/L    Comment: DELTA CHECK NOTED   Chloride 114 (H) 98 - 111 mmol/L   CO2 17 (L) 22 - 32 mmol/L   Glucose, Bld 112 (H) 70 - 99 mg/dL   BUN 33 (H) 8 - 23 mg/dL   Creatinine, Ser 2.34 (H) 0.61 - 1.24 mg/dL   Calcium 7.6 (L) 8.9 - 10.3 mg/dL    Comment: DELTA CHECK NOTED   Total Protein 4.4 (L) 6.5 - 8.1 g/dL   Albumin 2.8  (L) 3.5 - 5.0 g/dL   AST 23 15 - 41 U/L   ALT 9 0 - 44 U/L   Alkaline Phosphatase 33 (L) 38 - 126 U/L   Total Bilirubin 0.9 0.3 - 1.2 mg/dL   GFR calc non Af Amer 28 (L) >60 mL/min   GFR calc Af Amer 33 (L) >60 mL/min   Anion gap 6 5 - 15    Comment: Performed at [facility], [place] [address], [place], [place] [address]  Magnesium     Status: Abnormal   Collection Time: 04/23/19  1:20 AM  Result Value Ref Range   Magnesium 1.5 (L) 1.7 - 2.4 mg/dL    Comment: Performed at [facility] [address], [place], [place] [address]  SARS Coronavirus 2 (CEPHEID - Performed in [facility] hospital lab), Hosp Order     Status: None   Collection Time: 04/23/19  1:24 AM   Specimen: Nasopharyngeal Swab  Result Value Ref Range   SARS  Coronavirus 2 NEGATIVE NEGATIVE    Comment: (NOTE) If result is NEGATIVE SARS-CoV-2 target nucleic acids are NOT DETECTED. The SARS-CoV-2 RNA is generally detectable in upper and lower  respiratory specimens during the acute phase of infection. The lowest  concentration of SARS-CoV-2 viral copies this assay can detect is 250  copies / mL. A negative result does not preclude SARS-CoV-2 infection  and should not be used as the sole basis for treatment or other  patient management decisions.  A negative result may occur with  improper specimen collection / handling, submission of specimen other  than nasopharyngeal swab, presence of viral mutation(s) within the  areas targeted by this assay, and inadequate number of viral copies  (<250 copies / mL). A negative result must be combined with clinical  observations, patient history, and epidemiological information. If result is POSITIVE SARS-CoV-2 target nucleic acids are DETECTED. The SARS-CoV-2 RNA is generally detectable in upper and lower  respiratory specimens dur ing the acute phase of infection.  Positive  results are indicative of active infection with SARS-CoV-2.  Clinical  correlation with  patient history and other diagnostic information is  necessary to determine patient infection status.  Positive results do  not rule out bacterial infection or co-infection with other viruses. If result is PRESUMPTIVE POSTIVE SARS-CoV-2 nucleic acids MAY BE PRESENT.   A presumptive positive result was obtained on the submitted specimen  and confirmed on repeat testing.  While 2019 novel coronavirus  (SARS-CoV-2) nucleic acids may be present in the submitted sample  additional confirmatory testing may be necessary for epidemiological  and / or clinical management purposes  to differentiate between  SARS-CoV-2 and other Sarbecovirus currently known to infect humans.  If clinically indicated additional testing with an alternate test  methodology [phone]) is advised. The SARS-CoV-2 RNA is generally  detectable in upper and lower respiratory sp ecimens during the acute  phase of infection. The expected result is Negative. Fact Sheet for Patients:  StrictlyIdeas.no Fact Sheet for Healthcare Providers: BankingDealers.co.za This test is not yet approved or cleared by the [place] FDA and has been authorized for detection and/or diagnosis of SARS-CoV-2 by FDA under an Emergency Use Authorization (EUA).  This EUA will remain in effect (meaning this test can be used) for the duration of the COVID-19 declaration under Section 564(b)(1) of the Act, 21 U.S.C. section 360bbb-3(b)(1), unless the authorization is terminated or revoked sooner. Performed at [facility], [place] [address] [place], [place] [address]   POCT I-Stat EG7     Status: Abnormal   Collection Time: 04/23/19  1:27 AM  Result Value Ref Range   pH, Ven 7.347 7.250 - 7.430   pCO2, Ven 30.9 (L) 44.0 - 60.0 mmHg   pO2, Ven 54.0 (H) 32.0 - 45.0 mmHg   Bicarbonate 16.9 (L) 20.0 - 28.0 mmol/L   TCO2 18 (L) 22 - 32 mmol/L   O2 Saturation 87.0 %   Acid-base deficit 7.0 (H) 0.0 -  2.0 mmol/L   Sodium 138 135 - 145 mmol/L   Potassium 5.4 (H) 3.5 - 5.1 mmol/L   Calcium, Ion 1.06 (L) 1.15 - 1.40 mmol/L   HCT 45.0 39.0 - 52.0 %   Hemoglobin 15.3 13.0 - 17.0 g/dL   Patient temperature HIDE    Sample type VENOUS   CBC     Status: Abnormal   Collection Time: 04/23/19  1:48 AM  Result Value Ref Range   WBC 11.4 (H) 4.0 - 10.5 K/uL  RBC 4.75 4.22 - 5.81 MIL/uL   Hemoglobin 15.2 13.0 - 17.0 g/dL   HCT 46.3 39.0 - 52.0 %   MCV 97.5 80.0 - 100.0 fL    Comment: REPEATED TO VERIFY   MCH 32.0 26.0 - 34.0 pg   MCHC 32.8 30.0 - 36.0 g/dL   RDW 13.5 11.5 - 15.5 %   Platelets 226 150 - 400 K/uL    Comment: REPEATED TO VERIFY   nRBC 0.0 0.0 - 0.2 %    Comment: Performed at [facility], [place] [address] [place], [place] [address]  HIV antibody (Routine Testing)     Status: None   Collection Time: 04/23/19  5:46 AM  Result Value Ref Range   HIV Screen 4th Generation wRfx Non Reactive Non Reactive    Comment: (NOTE) Performed At: [facility] [place], [place] [id number] Rush Farmer MD [id number]   Troponin I (High Sensitivity)     Status: Abnormal   Collection Time: 04/23/19  5:46 AM  Result Value Ref Range   Troponin I (High Sensitivity) 934 (HH) <18 ng/L    Comment: CRITICAL RESULT CALLED TO, READ BACK BY AND VERIFIED WITH: DIAZ,A RN 04/23/2019 0656 JORDANS (NOTE) Elevated high sensitivity troponin I (hsTnI) values and significant  changes across serial measurements may suggest ACS but many other  chronic and acute conditions are known to elevate hsTnI results.  Refer to the Links section for chest pain algorithms and additional  guidance. Performed at [facility], [place] [address] [place], [place] [address]   MRSA PCR Screening     Status: None   Collection Time: 04/23/19  6:33 AM   Specimen: Nasal Mucosa; Nasopharyngeal  Result Value Ref Range   MRSA by PCR NEGATIVE NEGATIVE    Comment:        The GeneXpert MRSA  Assay (FDA approved for NASAL specimens only), is one component of a comprehensive MRSA colonization surveillance program. It is not intended to diagnose MRSA infection nor to guide or monitor treatment for MRSA infections. Performed at [facility], [place] [address], [place], [place] [address]   Comprehensive metabolic panel     Status: Abnormal   Collection Time: 04/23/19  8:10 AM  Result Value Ref Range   Sodium 136 135 - 145 mmol/L   Potassium 5.1 3.5 - 5.1 mmol/L   Chloride 113 (H) 98 - 111 mmol/L   CO2 13 (L) 22 - 32 mmol/L   Glucose, Bld 243 (H) 70 - 99 mg/dL   BUN 35 (H) 8 - 23 mg/dL   Creatinine, Ser 2.50 (H) 0.61 - 1.24 mg/dL   Calcium 7.8 (L) 8.9 - 10.3 mg/dL   Total Protein 4.8 (L) 6.5 - 8.1 g/dL   Albumin 3.0 (L) 3.5 - 5.0 g/dL   AST 26 15 - 41 U/L   ALT 10 0 - 44 U/L   Alkaline Phosphatase 34 (L) 38 - 126 U/L   Total Bilirubin 0.4 0.3 - 1.2 mg/dL   GFR calc non Af Amer 26 (L) >60 mL/min   GFR calc Af Amer 30 (L) >60 mL/min   Anion gap 10 5 - 15    Comment: Performed at [facility], [place] [address], [place], [place] [address]  Magnesium     Status: None   Collection Time: 04/23/19  8:10 AM  Result Value Ref Range   Magnesium 1.8 1.7 - 2.4 mg/dL    Comment: Performed at [facility], [place] [address], [place], [place] [address]  Lactic acid, plasma     Status: Abnormal   Collection Time: 04/23/19  8:10 AM  Result Value Ref Range   Lactic Acid, Venous 3.7 (HH) 0.5 - 1.9 mmol/L    Comment: CRITICAL RESULT CALLED TO, READ BACK BY AND VERIFIED WITH: Myrlene Broker AT [id number] 04/23/2019 BY ZBEECH. Performed at [facility], [place] [address], [place], [place] [address]   Troponin I (High Sensitivity)     Status: Abnormal   Collection Time: 04/23/19  8:10 AM  Result Value Ref Range   Troponin I (High Sensitivity) 1,275 (HH) <18 ng/L    Comment: CRITICAL VALUE NOTED.  VALUE IS CONSISTENT WITH PREVIOUSLY REPORTED AND CALLED  VALUE. (NOTE) Elevated high sensitivity troponin I (hsTnI) values and significant  changes across serial measurements may suggest ACS but many other  chronic and acute conditions are known to elevate hsTnI results.  Refer to the Links section for chest pain algorithms and additional  guidance. Performed at [facility], [place] [address], [place], [place] [address]   Lactic acid, plasma     Status: None   Collection Time: 04/23/19 11:00 AM  Result Value Ref Range   Lactic Acid, Venous 1.9 0.5 - 1.9 mmol/L    Comment: Performed at [facility] [address], [place], [place] [address]  Hemoglobin A1c     Status: None   Collection Time: 04/23/19 11:00 AM  Result Value Ref Range   Hgb A1c MFr Bld 5.4 4.8 - 5.6 %    Comment: (NOTE) Pre diabetes:          5.7%-6.4% Diabetes:              >6.4% Glycemic control for   <7.0% adults with diabetes    Mean Plasma Glucose 108.28 mg/dL    Comment: Performed at [facility] [address], [place], [place] [address]  Glucose, capillary     Status: Abnormal   Collection Time: 04/23/19 12:18 PM  Result Value Ref Range   Glucose-Capillary 146 (H) 70 - 99 mg/dL  Heparin level (unfractionated)     Status: None   Collection Time: 04/23/19  1:56 PM  Result Value Ref Range   Heparin Unfractionated 0.42 0.30 - 0.70 IU/mL    Comment: (NOTE) If heparin results are below expected values, and patient dosage has  been confirmed, suggest follow up testing of antithrombin III levels. Performed at [facility], [place] [address], [place], [place] [address]   ECHOCARDIOGRAM COMPLETE     Status: None   Collection Time: 04/23/19  3:29 PM  Result Value Ref Range   Weight 3,992.97 oz   Height 70 in   BP 108/62 mmHg  Glucose, capillary     Status: Abnormal   Collection Time: 04/23/19  4:09 PM  Result Value Ref Range   Glucose-Capillary 123 (H) 70 - 99 mg/dL   Comment 1 Notify RN    Comment 2 Document in Chart   Glucose, capillary      Status: Abnormal   Collection Time: 04/23/19  8:03 PM  Result Value Ref Range   Glucose-Capillary 131 (H) 70 - 99 mg/dL  Heparin level (unfractionated)     Status: None   Collection Time: 04/23/19  8:26 PM  Result Value Ref Range   Heparin Unfractionated 0.58 0.30 - 0.70 IU/mL    Comment: (NOTE) If heparin results are below expected values,  and patient dosage has  been confirmed, suggest follow up testing of antithrombin III levels. Performed at [facility], [place] [address], [place], [place] [address]   Glucose, capillary     Status: Abnormal   Collection Time: 04/23/19 11:34 PM  Result Value Ref Range   Glucose-Capillary 119 (H) 70 - 99 mg/dL  CBC     Status: Abnormal   Collection Time: 04/24/19  2:47 AM  Result Value Ref Range   WBC 7.0 4.0 - 10.5 K/uL   RBC 4.02 (L) 4.22 - 5.81 MIL/uL   Hemoglobin 12.8 (L) 13.0 - 17.0 g/dL   HCT 38.6 (L) 39.0 - 52.0 %   MCV 96.0 80.0 - 100.0 fL   MCH 31.8 26.0 - 34.0 pg   MCHC 33.2 30.0 - 36.0 g/dL   RDW 13.8 11.5 - 15.5 %   Platelets 149 (L) 150 - 400 K/uL   nRBC 0.0 0.0 - 0.2 %    Comment: Performed at [facility], [place]. [address] [place], [place] [id number]  Basic metabolic panel     Status: Abnormal   Collection Time: 04/24/19  2:47 AM  Result Value Ref Range   Sodium 138 135 - 145 mmol/L   Potassium 4.6 3.5 - 5.1 mmol/L   Chloride 115 (H) 98 - 111 mmol/L   CO2 18 (L) 22 - 32 mmol/L   Glucose, Bld 137 (H) 70 - 99 mg/dL   BUN 31 (H) 8 - 23 mg/dL   Creatinine, Ser 1.67 (H) 0.61 - 1.24 mg/dL   Calcium 8.4 (L) 8.9 - 10.3 mg/dL   GFR calc non Af Amer 43 (L) >60 mL/min   GFR calc Af Amer 49 (L) >60 mL/min   Anion gap 5 5 - 15    Comment: Performed at [facility] [address], [place], [place] [address]  Magnesium     Status: None   Collection Time: 04/24/19  2:47 AM  Result Value Ref Range   Magnesium 2.0 1.7 - 2.4 mg/dL    Comment: Performed at [facility] [address] [place], [place]  [address]  Heparin level (unfractionated)     Status: Abnormal   Collection Time: 04/24/19  2:47 AM  Result Value Ref Range   Heparin Unfractionated 0.78 (H) 0.30 - 0.70 IU/mL    Comment: (NOTE) If heparin results are below expected values, and patient dosage has  been confirmed, suggest follow up testing of antithrombin III levels. Performed at [facility], [place] [address] [place], [place] [address]   Glucose, capillary     Status: Abnormal   Collection Time: 04/24/19  3:04 AM  Result Value Ref Range   Glucose-Capillary 113 (H) 70 - 99 mg/dL   Comment 1 Notify RN    Comment 2 Document in Chart   Glucose, capillary     Status: Abnormal   Collection Time: 04/24/19  8:12 AM  Result Value Ref Range   Glucose-Capillary 119 (H) 70 - 99 mg/dL   Comment 1 Notify RN    Comment 2 Document in Chart   Heparin level (unfractionated)     Status: None   Collection Time: 04/24/19 11:04 AM  Result Value Ref Range   Heparin Unfractionated 0.60 0.30 - 0.70 IU/mL    Comment: (NOTE) If heparin results are below expected values, and patient dosage has  been confirmed, suggest follow up testing of antithrombin III levels. Performed at [facility], [address] [place], [place] [address]   Brain natriuretic peptide     Status: Abnormal   Collection Time: 06/09/19  2:53 PM  Result Value Ref Range   Pro B Natriuretic peptide (BNP) 102.0 (H) 0.0 - 100.0 pg/mL  D-dimer, quantitative (not at [facility])     Status: Abnormal   Collection Time: 06/09/19  2:53 PM  Result Value Ref Range   D-Dimer, Quant 4.44 (H) <0.50 mcg/mL FEU    Comment: . The D-Dimer test is used frequently to exclude an acute PE or DVT. In patients with a low to moderate clinical risk assessment and a D-Dimer result <0.50 mcg/mL FEU, the likelihood of a PE or DVT is very low. However, a thromboembolic event should not be excluded solely on the basis of the D-Dimer level. Increased levels of D-Dimer are associated with a  PE, DVT, DIC, malignancies, inflammation, sepsis, surgery, trauma, pregnancy, and advancing patient age. [Jama 2006 11:295(2):199-207] . For additional information, please refer to: http://education.questdiagnostics.com/faq/FAQ149 (This link is being provided for informational/ educational purposes only) .   IgE     Status: Abnormal   Collection Time: 06/09/19  2:53 PM  Result Value Ref Range   IgE (Immunoglobulin E), Serum 204 (H) <OR=114 kU/L  CBC with Differential/Platelet     Status: Abnormal   Collection Time: 06/09/19  2:53 PM  Result Value Ref Range   WBC 5.5 4.0 - 10.5 K/uL   RBC 4.77 4.22 - 5.81 Mil/uL   Hemoglobin 15.2 13.0 - 17.0 g/dL   HCT 45.9 39.0 - 52.0 %   MCV 96.1 78.0 - 100.0 fl   MCHC 33.1 30.0 - 36.0 g/dL   RDW 14.7 11.5 - 15.5 %   Platelets 203.0 150.0 - 400.0 K/uL   Neutrophils Relative % 56.0 43.0 - 77.0 %   Lymphocytes Relative 28.9 12.0 - 46.0 %   Monocytes Relative 12.8 (H) 3.0 - 12.0 %   Eosinophils Relative 1.9 0.0 - 5.0 %   Basophils Relative 0.4 0.0 - 3.0 %   Neutro Abs 3.1 1.4 - 7.7 K/uL   Lymphs Abs 1.6 0.7 - 4.0 K/uL   Monocytes Absolute 0.7 0.1 - 1.0 K/uL   Eosinophils Absolute 0.1 0.0 - 0.7 K/uL   Basophils Absolute 0.0 0.0 - 0.1 K/uL  SARS Coronavirus 2 [facility] order, Performed in [facility] hospital lab) Nasopharyngeal Nasopharyngeal Swab     Status: None   Collection Time: 06/10/19  1:10 PM   Specimen: Nasopharyngeal Swab  Result Value Ref Range   SARS Coronavirus 2 NEGATIVE NEGATIVE    Comment: (NOTE) If result is NEGATIVE SARS-CoV-2 target nucleic acids are NOT DETECTED. The SARS-CoV-2 RNA is generally detectable in upper and lower  respiratory specimens during the acute phase of infection. The lowest  concentration of SARS-CoV-2 viral copies this assay can detect is 250  copies / mL. A negative result does not preclude SARS-CoV-2 infection  and should not be used as the sole basis for treatment or other  patient management  decisions.  A negative result may occur with  improper specimen collection / handling, submission of specimen other  than nasopharyngeal swab, presence of viral mutation(s) within the  areas targeted by this assay, and inadequate number of viral copies  (<250 copies / mL). A negative result must be combined with clinical  observations, patient history, and epidemiological information. If result is POSITIVE SARS-CoV-2 target nucleic acids are DETECTED. The SARS-CoV-2 RNA is generally detectable in upper and lower  respiratory  specimens dur ing the acute phase of infection.  Positive  results are indicative of active infection with SARS-CoV-2.  Clinical  correlation with patient history and other diagnostic information is  necessary to determine patient infection status.  Positive results do  not rule out bacterial infection or co-infection with other viruses. If result is PRESUMPTIVE POSTIVE SARS-CoV-2 nucleic acids MAY BE PRESENT.   A presumptive positive result was obtained on the submitted specimen  and confirmed on repeat testing.  While 2019 novel coronavirus  (SARS-CoV-2) nucleic acids may be present in the submitted sample  additional confirmatory testing may be necessary for epidemiological  and / or clinical management purposes  to differentiate between  SARS-CoV-2 and other Sarbecovirus currently known to infect humans.  If clinically indicated additional testing with an alternate test  methodology [phone]) is advised. The SARS-CoV-2 RNA is generally  detectable in upper and lower respiratory sp ecimens during the acute  phase of infection. The expected result is Negative. Fact Sheet for Patients:  StrictlyIdeas.no Fact Sheet for Healthcare Providers: BankingDealers.co.za This test is not yet approved or cleared by the [place] FDA and has been authorized for detection and/or diagnosis of SARS-CoV-2 by FDA under an  Emergency Use Authorization (EUA).  This EUA will remain in effect (meaning this test can be used) for the duration of the COVID-19 declaration under Section 564(b)(1) of the Act, 21 U.S.C. section 360bbb-3(b)(1), unless the authorization is terminated or revoked sooner. Performed at [facility], [place] [address], [place], [place] [id number]   Basic metabolic panel     Status: Abnormal   Collection Time: 06/10/19  1:40 PM  Result Value Ref Range   Sodium 136 135 - 145 mmol/L   Potassium 4.3 3.5 - 5.1 mmol/L   Chloride 107 98 - 111 mmol/L   CO2 22 22 - 32 mmol/L   Glucose, Bld 88 70 - 99 mg/dL   BUN 14 8 - 23 mg/dL   Creatinine, Ser 1.45 (H) 0.61 - 1.24 mg/dL   Calcium 9.5 8.9 - 10.3 mg/dL   GFR calc non Af Amer 50 (L) >60 mL/min   GFR calc Af Amer 58 (L) >60 mL/min   Anion gap 7 5 - 15    Comment: Performed at [facility], [place] [address], [place], [place] [address]  CBC with Differential     Status: None   Collection Time: 06/10/19  1:40 PM  Result Value Ref Range   WBC 5.8 4.0 - 10.5 K/uL   RBC 4.69 4.22 - 5.81 MIL/uL   Hemoglobin 14.9 13.0 - 17.0 g/dL   HCT 46.4 39.0 - 52.0 %   MCV 98.9 80.0 - 100.0 fL   MCH 31.8 26.0 - 34.0 pg   MCHC 32.1 30.0 - 36.0 g/dL   RDW 13.6 11.5 - 15.5 %   Platelets 187 150 - 400 K/uL   nRBC 0.0 0.0 - 0.2 %   Neutrophils Relative % 61 %   Neutro Abs 3.5 1.7 - 7.7 K/uL   Lymphocytes Relative 27 %   Lymphs Abs 1.6 0.7 - 4.0 K/uL   Monocytes Relative 11 %   Monocytes Absolute 0.6 0.1 - 1.0 K/uL   Eosinophils Relative 1 %   Eosinophils Absolute 0.1 0.0 - 0.5 K/uL   Basophils Relative 0 %   Basophils Absolute 0.0 0.0 - 0.1 K/uL   Immature Granulocytes 0 %   Abs Immature Granulocytes 0.02 0.00 - 0.07 K/uL    Comment:  Performed at [facility], [place] [address] [place], [place] [address]  Troponin I (High Sensitivity)     Status: None   Collection Time: 06/10/19  1:40 PM  Result Value Ref Range   Troponin I (High Sensitivity)  12 <18 ng/L    Comment: (NOTE) Elevated high sensitivity troponin I (hsTnI) values and significant  changes across serial measurements may suggest ACS but many other  chronic and acute conditions are known to elevate hsTnI results.  Refer to the "Links" section for chest pain algorithms and additional  guidance. Performed at [facility], [place] [address] [place], [place] [address]   Brain natriuretic peptide     Status: Abnormal   Collection Time: 06/10/19  1:40 PM  Result Value Ref Range   B Natriuretic Peptide 111.6 (H) 0.0 - 100.0 pg/mL    Comment: Performed at [place] [address] [place], [place] [address]  Troponin I (High Sensitivity)     Status: None   Collection Time: 06/10/19  2:58 PM  Result Value Ref Range   Troponin I (High Sensitivity) 12 <18 ng/L    Comment: (NOTE) Elevated high sensitivity troponin I (hsTnI) values and significant  changes across serial measurements may suggest ACS but many other  chronic and acute conditions are known to elevate hsTnI results.  Refer to the "Links" section for chest pain algorithms and additional  guidance. Performed at [facility], [place] [address] [place], [place] [address]   ECHOCARDIOGRAM LIMITED     Status: None   Collection Time: 06/10/19  5:21 PM  Result Value Ref Range   Weight 3,692.79 oz   Height 70 in   BP 131/74 mmHg  Heparin level (unfractionated)     Status: Abnormal   Collection Time: 06/10/19  8:14 PM  Result Value Ref Range   Heparin Unfractionated 1.01 (H) 0.30 - 0.70 IU/mL    Comment: RESULTS CONFIRMED BY MANUAL DILUTION (NOTE) If heparin results are below expected values, and patient dosage has  been confirmed, suggest follow up testing of antithrombin III levels. Performed at [facility], [place] [address] [place] [address]   CBC     Status: None   Collection Time: 06/11/19  4:39 AM  Result Value Ref Range   WBC 5.9 4.0 - 10.5 K/uL   RBC 4.34 4.22 - 5.81  MIL/uL   Hemoglobin 13.7 13.0 - 17.0 g/dL   HCT 42.4 39.0 - 52.0 %   MCV 97.7 80.0 - 100.0 fL   MCH 31.6 26.0 - 34.0 pg   MCHC 32.3 30.0 - 36.0 g/dL   RDW 13.4 11.5 - 15.5 %   Platelets 182 150 - 400 K/uL   nRBC 0.0 0.0 - 0.2 %    Comment: Performed at [facility], [place] [address] [place], [place] [address]  Heparin level (unfractionated)     Status: None   Collection Time: 06/11/19  4:39 AM  Result Value Ref Range   Heparin Unfractionated 0.67 0.30 - 0.70 IU/mL    Comment: (NOTE) If heparin results are below expected values, and patient dosage has  been confirmed, suggest follow up testing of antithrombin III levels. Performed at [facility], [place] [address], [place], [place] [id number]   Basic metabolic panel     Status: Abnormal   Collection Time: 06/11/19  4:39 AM  Result Value Ref Range   Sodium 138 135 - 145 mmol/L  Potassium 4.3 3.5 - 5.1 mmol/L   Chloride 107 98 - 111 mmol/L   CO2 24 22 - 32 mmol/L   Glucose, Bld 80 70 - 99 mg/dL   BUN 14 8 - 23 mg/dL   Creatinine, Ser 1.64 (H) 0.61 - 1.24 mg/dL   Calcium 9.5 8.9 - 10.3 mg/dL   GFR calc non Af Amer 43 (L) >60 mL/min   GFR calc Af Amer 50 (L) >60 mL/min   Anion gap 7 5 - 15    Comment: Performed at [place] [address], [place], [place] [address]  Antithrombin III     Status: None   Collection Time: 06/11/19  9:19 AM  Result Value Ref Range   AntiThromb III Func 85 75 - 120 %    Comment: Performed at [place] [address] [place], [place] [address]  Lupus anticoagulant panel     Status: Abnormal   Collection Time: 06/11/19  9:19 AM  Result Value Ref Range   PTT Lupus Anticoagulant 57.5 (H) 0.0 - 51.9 sec    Comment: (NOTE) Additional testing confirms the presence of heparin in the test sample. Results obtained after heparin neutralization.    DRVVT 40.3 0.0 - 47.0 sec   Lupus Anticoag Interp Comment:     Comment: (NOTE) No lupus anticoagulant was detected. Results  suggest the presence of an inhibitor.  The presence of heparin, which is a non-specific inhibitor, may cause this pattern of results. Since the PTT-LA was extended and the dRVVT was within normal limits, a specific inhibitor to factor VIII, IX, XI, or XII cannot be excluded. As antibody titers may fluctuate with time, repeat testing may be indicated and ideally should be performed in the absence of anticoagulant therapy. Performed At: [facility] [place], [place] [id number] Rush Farmer MD [id number]   Beta-2-glycoprotein i abs, IgG/M/A     Status: None   Collection Time: 06/11/19  9:19 AM  Result Value Ref Range   Beta-2 Glyco I IgG <9 0 - 20 GPI IgG units    Comment: (NOTE) The reference interval reflects a 3SD or 99th percentile interval, which is thought to represent a potentially clinically significant result in accordance with the International Consensus Statement on the classification criteria for definitive antiphospholipid syndrome (APS). J Thromb Haem 2006;4:295-306.    Beta-2-Glycoprotein I IgM <9 0 - 32 GPI IgM units    Comment: (NOTE) The reference interval reflects a 3SD or 99th percentile interval, which is thought to represent a potentially clinically significant result in accordance with the International Consensus Statement on the classification criteria for definitive antiphospholipid syndrome (APS). J Thromb Haem 2006;4:295-306. Performed At: [facility] [place], [place] [id number] Rush Farmer MD [id number]    Beta-2-Glycoprotein I IgA <9 0 - 25 GPI IgA units    Comment: (NOTE) The reference interval reflects a 3SD or 99th percentile interval, which is thought to represent a potentially clinically significant result in accordance with the International Consensus Statement on the classification criteria for definitive antiphospholipid syndrome (APS). J Thromb Haem 2006;4:295-306.    Homocysteine, serum     Status: None   Collection Time: 06/11/19  9:19 AM  Result Value Ref Range   Homocysteine 16.1 0.0 - 17.2 umol/L    Comment: (NOTE)              **Please note reference interval change** Performed At: [facility] [address] [place], [place] [id number]  Rush Farmer MD [id number]   Factor 5 leiden     Status: None   Collection Time: 06/11/19  9:19 AM  Result Value Ref Range   Recommendations-F5LEID: Comment     Comment: (NOTE) Result:  Negative (no mutation found) Factor V Leiden is a specific mutation (R506Q) in the factor V gene that is associated with an increased risk of venous thrombosis. Factor V Leiden is more resistant to inactivation by activated protein C.  As a result, factor V persists in the circulation leading to a mild hyper- coagulable state.  The Leiden mutation accounts for 90% - 95% of APC resistance.  Factor V Leiden has been reported in patients with deep vein thrombosis, pulmonary embolus, central retinal vein occlusion, cerebral sinus thrombosis and hepatic vein thrombosis. Other risk factors to be considered in the workup for venous thrombosis include the G20210A mutation in the factor II (prothrombin) gene, protein S and C deficiency, and antithrombin deficiencies. Anticardiolipin antibody and lupus anticoagulant analysis may be appropriate for certain patients, as well as homocysteine levels. Contact your local [facility] for information on how to order additi onal testing if desired. **Genetic counselors are available for health care providers to**  discuss results at [phone]). Methodology: DNA analysis of the Factor V gene was performed by allele-specific PCR. The diagnostic sensitivity and specificity is >99% for both. Molecular-based testing is highly accurate, but as in any laboratory test, diagnostic errors may occur. All test results must be combined with clinical information for the most  accurate interpretation. This test was developed and its performance characteristics determined by [facility]. It has not been cleared or approved by the Food and Drug Administration. References: Voelkerding K (1996).  Clin Lab Med [phone]. Allison Quarry, PhD, [facility] Ruben Reason, PhD, [facility] Annetta Maw, M.S., PhD, [facility] Alfredo Bach, PhD, [facility] Norva Riffle, PhD, [facility] Earlean Polka PhD, [facility] Performed At: [facility] [place], [place] [id number] Katina Degree MDPhD [id number]   Prothrombin gene mutation     Status: None   Collection Time: 06/11/19  9:19 AM  Result Value Ref Range   Recommendations-PTGENE: Comment     Comment: (NOTE) NEGATIVE No mutation identified. Comment: A point mutation (G20210A) in the factor II (prothrombin) gene is the second most common cause of inherited thrombophilia. The incidence of this mutation in the [place] Caucasian population is about 2% and in the [place] American population it is approximately 0.5%. This mutation is rare in the [place] and Native American population. Being heterozygous for a prothrombin mutation increases the risk for developing venous thrombosis about 2 to 3 times above the general population risk. Being homozygous for the prothrombin gene mutation increases the relative risk for venous thrombosis further, although it is not yet known how much further the risk is increased. In women heterozygous for the prothrombin gene mutation, the use of estrogen containing oral contraceptives increases the relative risk of venous thrombosis about 16 times and the risk of developing cerebral thrombosis is also significantly increased. In pregnancy the pr othrombin gene mutation increases risk for venous thrombosis and may increase risk for stillbirth, placental abruption, pre-eclampsia and fetal growth restriction. If the patient possesses two or more congenital or acquired thrombophilic risk factors,  the risk for thrombosis may rise to more than the sum of the risk ratios for the individual mutations. This assay detects only the prothrombin G20210A mutation and does not measure genetic abnormalities elsewhere in the genome. Other thrombotic risk factors may  be pursued through systematic clinical laboratory analysis. These factors include the R506Q (Leiden) mutation in the Factor V gene, plasma homocysteine levels, as well as testing for deficiencies of antithrombin III, protein C and protein S. Genetic Counselors are available for health care providers to discuss results at [phone]). Methodology: DNA analysis of the Factor II gene was performed by PCR amplification followed by restriction analysis. The di agnostic sensitivity is >99% for both. All the tests must be combined with clinical information for the most accurate interpretation. Molecular-based testing is highly accurate, but as in any laboratory test, diagnostic errors may occur. This test was developed and its performance characteristics determined by [facility]. It has not been cleared or approved by the Food and Drug Administration. Poort SR, et al. Blood. [id number]. Varga EA. Circulation. 2004; J955636. Mervin Hack, et [place]; 19:700-703. Allison Quarry, PhD, [facility] Ruben Reason, PhD, [facility] Annetta Maw, M.S., PhD, [facility] Alfredo Bach, PhD, [facility] Norva Riffle, PhD, [facility] Earlean Polka, PhD, [facility] Performed At: [facility] [address] [place], [place] [id number] Katina Degree MDPhD [id number]   Cardiolipin antibodies, IgG, IgM, IgA     Status: Abnormal   Collection Time: 06/11/19  9:19 AM  Result Value Ref Range   Anticardiolipin IgG <9 0 - 14 GPL U/mL    Comment: (NOTE)                          Negative:              <15                          Indeterminate:     15 - 20                          Low-Med Positive: >20 - 80                           High Positive:         >80    Anticardiolipin IgM 14 (H) 0 - 12 MPL U/mL    Comment: (NOTE)                          Negative:              <13                          Indeterminate:     13 - 20                          Low-Med Positive: >20 - 80                          High Positive:         >80    Anticardiolipin IgA <9 0 - 11 APL U/mL    Comment: (NOTE)                          Negative:              <12  Indeterminate:     12 - 20                          Low-Med Positive: >20 - 80                          High Positive:         >80 Performed At: [facility] [place], [place] [id number] Rush Farmer MD [id number]   PTT-LA Mix     Status: Abnormal   Collection Time: 06/11/19  9:19 AM  Result Value Ref Range   PTT-LA Mix 49.9 (H) 0.0 - 48.9 sec    Comment: (NOTE) Performed At: [facility] [place], [place] [id number] Rush Farmer MD [id number]   Hexagonal Phase Phospholipid     Status: None   Collection Time: 06/11/19  9:19 AM  Result Value Ref Range   Hexagonal Phase Phospholipid 8 0 - 11 sec    Comment: (NOTE) Performed At: [facility] [place], [place] [id number] Rush Farmer MD [id number]   Heparin level (unfractionated)     Status: None   Collection Time: 06/11/19 11:41 AM  Result Value Ref Range   Heparin Unfractionated 0.66 0.30 - 0.70 IU/mL    Comment: (NOTE) If heparin results are below expected values, and patient dosage has  been confirmed, suggest follow up testing of antithrombin III levels. Performed at [facility], [place] [address] [place] [address]   CBC     Status: None   Collection Time: 06/12/19  3:25 AM  Result Value Ref Range   WBC 5.8 4.0 - 10.5 K/uL   RBC 4.36 4.22 - 5.81 MIL/uL   Hemoglobin 13.8 13.0 - 17.0 g/dL   HCT 42.4 39.0 - 52.0 %   MCV 97.2 80.0 - 100.0 fL   MCH 31.7 26.0 - 34.0 pg   MCHC  32.5 30.0 - 36.0 g/dL   RDW 13.4 11.5 - 15.5 %   Platelets 162 150 - 400 K/uL   nRBC 0.0 0.0 - 0.2 %    Comment: Performed at [facility], [place] [address], [place], [place] [address]  Heparin level (unfractionated)     Status: Abnormal   Collection Time: 06/12/19  3:25 AM  Result Value Ref Range   Heparin Unfractionated 0.76 (H) 0.30 - 0.70 IU/mL    Comment: (NOTE) If heparin results are below expected values, and patient dosage has  been confirmed, suggest follow up testing of antithrombin III levels. Performed at [facility], [place] [address] [place], [place] [id number]   Basic metabolic panel     Status: Abnormal   Collection Time: 06/12/19  3:25 AM  Result Value Ref Range   Sodium 138 135 - 145 mmol/L   Potassium 4.3 3.5 - 5.1 mmol/L   Chloride 108 98 - 111 mmol/L   CO2 23 22 - 32 mmol/L   Glucose, Bld 85 70 - 99 mg/dL   BUN 12 8 - 23 mg/dL   Creatinine, Ser 1.45 (H) 0.61 - 1.24 mg/dL   Calcium 9.4 8.9 - 10.3 mg/dL   GFR calc non Af Amer 50 (L) >60 mL/min   GFR calc Af Amer 58 (L) >60 mL/min   Anion gap 7 5 - 15    Comment: Performed at [facility], [place] [address], [place], [place] [address]  Heparin  level (unfractionated)     Status: None   Collection Time: 06/12/19 11:48 AM  Result Value Ref Range   Heparin Unfractionated 0.47 0.30 - 0.70 IU/mL    Comment: (NOTE) If heparin results are below expected values, and patient dosage has  been confirmed, suggest follow up testing of antithrombin III levels. Performed at [facility], [place] [address], [place], [place] [address]   Heparin level (unfractionated)     Status: None   Collection Time: 06/12/19  6:21 PM  Result Value Ref Range   Heparin Unfractionated 0.60 0.30 - 0.70 IU/mL    Comment: (NOTE) If heparin results are below expected values, and patient dosage has  been confirmed, suggest follow up testing of antithrombin III levels. Performed at [facility], [place] [address]  [place] [address]   CBC     Status: None   Collection Time: 06/13/19  2:54 AM  Result Value Ref Range   WBC 5.5 4.0 - 10.5 K/uL   RBC 4.44 4.22 - 5.81 MIL/uL   Hemoglobin 13.9 13.0 - 17.0 g/dL   HCT 42.6 39.0 - 52.0 %   MCV 95.9 80.0 - 100.0 fL   MCH 31.3 26.0 - 34.0 pg   MCHC 32.6 30.0 - 36.0 g/dL   RDW 13.2 11.5 - 15.5 %   Platelets 154 150 - 400 K/uL   nRBC 0.0 0.0 - 0.2 %    Comment: Performed at [facility], [place] [address], [place], [place] [address]  Heparin level (unfractionated)     Status: None   Collection Time: 06/13/19  2:54 AM  Result Value Ref Range   Heparin Unfractionated 0.51 0.30 - 0.70 IU/mL    Comment: (NOTE) If heparin results are below expected values, and patient dosage has  been confirmed, suggest follow up testing of antithrombin III levels. Performed at [facility], [place] [address], [place], [place] [address]   Lactate dehydrogenase     Status: None   Collection Time: 07/14/19  2:02 PM  Result Value Ref Range   LDH 131 98 - 192 U/L    Comment: Performed at [facility], [address], [place], [place] [address]    RADIOGRAPHIC STUDIES: I have personally reviewed the radiological images as listed and agreed with the findings in the report.  ASSESSMENT & PLAN:  Pulmonary embolism ([place]) 1.  Unprovoked pulmonary embolism: - He was evaluated by Dr. Vaughan Browner for dyspnea on 06/09/2019, found to have elevated d-dimer. - CT angiogram on 06/10/2019 shows acute bilateral extensive pulmonary thrombus, no saddle embolus, suspicion for right heart strain.  No pulmonary infarct or effusion.  Questionable thrombus in the IVC and right inferior pulmonary vein. -2D echocardiogram on 06/10/2019 shows EF 55-60%.  Right ventricle has moderately reduced systolic function.  Cavity moderately enlarged.  Right ventricular systolic pressure mildly elevated. -Bilateral lower extremity Dopplers were negative. - Hypercoagulable work-up including factor V Leiden,  prothrombin gene mutation, Antithrombin III were negative.  Lupus anticoagulant was also negative.  Anticardiolipin antibody and beta-2 glycoprotein 1 antibody were also negative. - Physical examination today showed subcentimeter lymph node in the right axillary region. - No family history of clotting disorders.  No personal history of connective tissue disorders.  Patient is a non-smoker. -Colonoscopy in 2007 in [place] was within normal limits. - Patient reported an anaphylactic reaction in July lasting for 4 days with swelling of his abdomen and lower extremities.  He lost about 10 pounds since  July.  His appetite has been good. - Because of unprovoked nature of pulmonary embolism, I have recommended indefinite anticoagulation. - I will check his factor VIII level and Jak 2 V6 28F level for the sake of completion of hypercoagulable work-up. - I will reevaluate him in 6-70-month intervals to assess risk-benefit analysis.     All questions were answered. The patient knows to call the clinic with any problems, questions or concerns.      Derek Jack, MD 07/14/19 7:29 PM

## 2019-07-14 NOTE — Patient Instructions (Addendum)
Jesse Coleman at [facility] Discharge Instructions  You were seen today by Dr. Delton Coombes. He went over your history, family history and how you've been feeling lately. He will have blood drawn today. He will schedule you for a phone visit in 2 weeks for follow up.   Thank you for choosing [facility] to provide your oncology and hematology care.  To afford each patient quality time with our provider, please arrive at least 15 minutes before your scheduled appointment time.   If you have a lab appointment with the [facility] please come in thru the  Main Entrance and check in at the main information desk  You need to re-schedule your appointment should you arrive 10 or more minutes late.  We strive to give you quality time with our providers, and arriving late affects you and other patients whose appointments are after yours.  Also, if you no show three or more times for appointments you may be dismissed from the clinic at the providers discretion.     Again, thank you for choosing [facility].  Our hope is that these requests will decrease the amount of time that you wait before being seen by our physicians.       _____________________________________________________________  Should you have questions after your visit to [facility], please contact our office at [phone] between the hours of 8:00 a.m. and 4:30 p.m.  Voicemails left after 4:00 p.m. will not be returned until the following business day.  For prescription refill requests, have your pharmacy contact our office and allow 72 hours.    Cancer Center Support Programs:   > Cancer Support Group  2nd Tuesday of the month 1pm-2pm, Journey Room

## 2019-07-14 NOTE — Assessment & Plan Note (Signed)
1.  Unprovoked pulmonary embolism: - He was evaluated by Dr. Vaughan Browner for dyspnea on 06/09/2019, found to have elevated d-dimer. - CT angiogram on 06/10/2019 shows acute bilateral extensive pulmonary thrombus, no saddle embolus, suspicion for right heart strain.  No pulmonary infarct or effusion.  Questionable thrombus in the IVC and right inferior pulmonary vein. -2D echocardiogram on 06/10/2019 shows EF 55-60%.  Right ventricle has moderately reduced systolic function.  Cavity moderately enlarged.  Right ventricular systolic pressure mildly elevated. -Bilateral lower extremity Dopplers were negative. - Hypercoagulable work-up including factor V Leiden, prothrombin gene mutation, Antithrombin III were negative.  Lupus anticoagulant was also negative.  Anticardiolipin antibody and beta-2 glycoprotein 1 antibody were also negative. - Physical examination today showed subcentimeter lymph node in the right axillary region. - No family history of clotting disorders.  No personal history of connective tissue disorders.  Patient is a non-smoker. -Colonoscopy in 2007 in [place] was within normal limits. - Patient reported an anaphylactic reaction in July lasting for 4 days with swelling of his abdomen and lower extremities.  He lost about 10 pounds since July.  His appetite has been good. - Because of unprovoked nature of pulmonary embolism, I have recommended indefinite anticoagulation. - I will check his factor VIII level and Jak 2 V6 68F level for the sake of completion of hypercoagulable work-up. - I will reevaluate him in 6-89-month intervals to assess risk-benefit analysis.

## 2019-07-15 LAB — VON WILLEBRAND ANTIGEN: Von Willebrand Antigen, Plasma: 170 % (ref 50–200)

## 2019-07-23 LAB — CALR + JAK2 E12-15 + MPL (REFLEXED)

## 2019-07-23 LAB — JAK2 V617F, W REFLEX TO CALR/E12/MPL

## 2019-07-29 ENCOUNTER — Other Ambulatory Visit: Payer: Self-pay

## 2019-07-29 ENCOUNTER — Inpatient Hospital Stay (HOSPITAL_BASED_OUTPATIENT_CLINIC_OR_DEPARTMENT_OTHER): Payer: Medicare Other | Admitting: Hematology

## 2019-07-29 ENCOUNTER — Encounter (HOSPITAL_COMMUNITY): Payer: Self-pay | Admitting: Hematology

## 2019-07-29 DIAGNOSIS — I2699 Other pulmonary embolism without acute cor pulmonale: Secondary | ICD-10-CM | POA: Diagnosis not present

## 2019-07-29 NOTE — Progress Notes (Signed)
Virtual Visit via Telephone Note  I connected with Jesse Coleman on 07/29/19 at  4:20 PM EDT by telephone and verified that I am speaking with the correct person using two identifiers.   I discussed the limitations, risks, security and privacy concerns of performing an evaluation and management service by telephone and the availability of in person appointments. I also discussed with the patient that there may be a patient responsible charge related to this service. The patient expressed understanding and agreed to proceed.   History of Present Illness:  1.  Unprovoked pulmonary embolism diagnosed on 06/10/2019 with extensive bilateral pulmonary embolus, questionable thrombus in the IVC and right inferior pulmonary vein.  Bilateral lower extremity Dopplers were negative.  Patient currently on Eliquis. Hypercoagulable work-up done during hospitalization was negative for factor V Leiden, prothrombin gene mutation.  Antithrombin III was normal.  Lupus anticoagulant was negative.  Anticardiolipin antibody and antibeta-2 glycoprotein 1 antibodies negative.    Observations/Objective: He reports that he has been tolerating Eliquis very well.  No bleeding issues reported.  Denies any fevers, night sweats or weight loss since last visit.   Assessment and Plan:  1.  Unprovoked pulmonary embolism: -I reviewed results of JAK2 mutation testing which was negative. -Patient reported that his estranged son also had DVT and had some abnormality with protein S. -I told him that I did not check for protein S because it can be falsely elevated while he is on Eliquis.  Typically we do not check it while patient is on anticoagulation. -I have recommended him to find out what exactly his son has. -I have recommended follow-up visits once a year to evaluate risk-benefit ratio for indefinite anticoagulation.  He is agreeable.  I will check a D-dimer at next visit.   Follow Up Instructions: RTC 1 year.   I  discussed the assessment and treatment plan with the patient. The patient was provided an opportunity to ask questions and all were answered. The patient agreed with the plan and demonstrated an understanding of the instructions.   The patient was advised to call back or seek an in-person evaluation if the symptoms worsen or if the condition fails to improve as anticipated.  I provided 11 minutes of non-face-to-face time during this encounter.   Derek Jack, MD

## 2019-08-10 ENCOUNTER — Encounter: Payer: Self-pay | Admitting: Cardiology

## 2019-08-18 ENCOUNTER — Other Ambulatory Visit: Payer: Self-pay | Admitting: Endocrinology

## 2019-08-18 ENCOUNTER — Other Ambulatory Visit: Payer: Self-pay

## 2019-08-18 DIAGNOSIS — D352 Benign neoplasm of pituitary gland: Secondary | ICD-10-CM

## 2019-08-18 MED ORDER — APIXABAN 5 MG PO TABS: 5.0000 mg | ORAL_TABLET | Freq: Two times a day (BID) | ORAL | 1 refills | Status: DC

## 2019-08-20 ENCOUNTER — Telehealth: Payer: Self-pay | Admitting: Pulmonary Disease

## 2019-08-20 NOTE — Telephone Encounter (Signed)
Spoke with pt, he states the Eliquis is over 400 dollars. I tried to call [employer] but could not get through to anyone. I called pt's insurance company at [phone] and spoke to a representative. He stated that Eliquis did not need a prior authorization and that the pharmacy was giving the pt the retail price. He didn't see any claims come through to fill this medicine. I called [facility] but couldn't get through to tell them to run the Rx through the insurance. I called the pt but there was no answer, LM to call back so I could let him know.

## 2019-08-20 NOTE — Telephone Encounter (Signed)
Patient called back - advised that we had tried to reach Jesse Coleman to make sure they are filing his insurance for the Eliquis - pt would like [place] to continue to try to reach pharmacy as they told him they did file insurance - pt can be reached at [phone] -pr

## 2019-08-20 NOTE — Telephone Encounter (Signed)
Patient called and spoke to [name] -pt states they were able to fill Eliquis for $47 - he needs nothing else -pr

## 2019-09-15 ENCOUNTER — Encounter: Payer: Self-pay | Admitting: Pulmonary Disease

## 2019-09-15 ENCOUNTER — Ambulatory Visit: Payer: Medicare Other | Admitting: Pulmonary Disease

## 2019-09-15 ENCOUNTER — Ambulatory Visit
Admission: RE | Admit: 2019-09-15 | Discharge: 2019-09-15 | Disposition: A | Discharge status: Home / Self Care | Payer: Medicare Other | Source: Ambulatory Visit | Attending: Endocrinology | Admitting: Endocrinology

## 2019-09-15 ENCOUNTER — Other Ambulatory Visit: Payer: Self-pay

## 2019-09-15 VITALS — BP 128/84 | HR 65 | Temp 97.7°F | Ht 70.0 in | Wt 240.0 lb

## 2019-09-15 DIAGNOSIS — Z86711 Personal history of pulmonary embolism: Secondary | ICD-10-CM

## 2019-09-15 DIAGNOSIS — D352 Benign neoplasm of pituitary gland: Secondary | ICD-10-CM

## 2019-09-15 MED ORDER — GADOBENATE DIMEGLUMINE 529 MG/ML IV SOLN
10.0000 mL | Freq: Once | INTRAVENOUS | Status: AC | PRN
  Administered 2019-09-15: 10:00:00 10 mL via INTRAVENOUS

## 2019-09-15 NOTE — Patient Instructions (Signed)
Continue your blood thinners as prescribed I recommend that we continue this indefinitely since there is a risk that the clot may recur off blood thinners Follow-up in 1 year

## 2019-09-15 NOTE — Progress Notes (Signed)
Jesse Coleman    WI:[id number]    06-07-1956  Primary Care Physician:South, Annie Main, MD  Referring Physician: Reynold Bowen, [place] [address] [place],  [place] [address]  Chief complaint: Follow-up for pulmonary embolism  HPI: 63 year old with history of hypertension, coronary artery disease, hyperlipidemia.  Complains of dyspnea for the past 3 to 4 years.  Reports an aspiration event about 4 years ago before onset of symptoms.  Has occasional hoarseness of voice, wheezing, chest congestion.  Denies any cough  He was evaluated at [facility] a couple of years ago and was thought he had acid reflux.  He does have occasional heartburn symptoms but is well controlled on antiacid medication.  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  He was on epinephrine drip at that time.  Noted to have chest pain with elevated troponins and evaluated by cardiology for coronary artery disease which is treated medically.    Hospitalized in September 2020 for large pulmonary embolism.  This was treated with heparin anticoagulation and transitioned to Eliquis. PCCM consulted and it was felt that he did not need thrombolytics or EKOS anticoagulation  Pets: Has a dog.  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  Currently works as a Cabin crew Exposures: No known exposures.  No mold, hot tub, Jacuzzi Smoking history: Never smoker Travel history: Previously lived in [place].  Moved to [place] around 2010.  No significant recent travel Relevant family history: No significant family history of lung disease.  Interim history: Continues on Eliquis anticoagulation States that his breathing is back to normal now.  He is seen Dr. Delton Coombes, hematology for evaluation of hypercoagulable work-up.  So far the work-up has been negative.  Outpatient Encounter Medications as of 09/15/2019  Medication Sig  . acetaminophen (TYLENOL 8 HOUR  ARTHRITIS PAIN) 650 MG CR tablet Take 650 mg by mouth 2 (two) times daily as needed for pain.   Marland Kitchen apixaban (ELIQUIS) 5 MG TABS tablet Take 1 tablet (5 mg total) by mouth 2 (two) times daily.  Marland Kitchen aspirin EC 81 MG tablet Take 81 mg by mouth daily.  . bromocriptine (PARLODEL) 2.5 MG tablet Take 1.25 mg by mouth at bedtime.   Marland Kitchen buPROPion (WELLBUTRIN XL) 150 MG 24 hr tablet Take 150 mg by mouth at bedtime.   . cholecalciferol (VITAMIN D3) 25 MCG (1000 UT) tablet Take 1,000 Units by mouth at bedtime.   Marland Kitchen EPINEPHrine 0.3 mg/0.3 mL IJ SOAJ injection Inject 0.3 mLs (0.3 mg total) into the muscle as needed for anaphylaxis.  . famotidine (PEPCID) 20 MG tablet Take 1 tablet (20 mg total) by mouth 2 (two) times daily.  . isosorbide dinitrate (ISORDIL) 30 MG tablet Take 30 mg by mouth 2 (two) times daily.   Marland Kitchen LORazepam (ATIVAN) 1 MG tablet Take 1 mg by mouth every 8 (eight) hours as needed for anxiety.   . metoprolol succinate (TOPROL-XL) 100 MG 24 hr tablet Take 100 mg by mouth daily. Take with or immediately following a meal.  . Multiple Vitamin (MULTIVITAMIN) capsule Take 1 capsule by mouth at bedtime.   Marland Kitchen NITROSTAT 0.4 MG SL tablet Place 0.4 mg under the tongue every 5 (five) minutes as needed for chest pain.   . sildenafil (VIAGRA) 100 MG tablet TAKE 1 TABLET BY MOUTH 30CMINUTES PRIOR TO 1HR BEFORE RELATIONS.  Marland Kitchen vitamin B-12 (CYANOCOBALAMIN) 1000 MCG tablet Take 1,000 mcg by mouth at bedtime.  No facility-administered encounter medications on file as of 09/15/2019.   Physical Exam: Blood pressure 128/84, pulse 65, temperature 97.7 F (36.5 C), temperature source Temporal, height 5\' 10"  (1.778 m), weight 240 lb (108.9 kg), SpO2 99 %. Gen:      No acute distress HEENT:  EOMI, sclera anicteric Neck:     No masses; no thyromegaly Lungs:    Clear to auscultation bilaterally; normal respiratory effort CV:         Regular rate and rhythm; no murmurs Abd:      + bowel sounds; soft, non-tender; no palpable  masses, no distension Ext:    No edema; adequate peripheral perfusion Skin:      Warm and dry; no rash Neuro: alert and oriented x 3 Psych: normal mood and affect  Data Reviewed: Imaging: CT chest 09/28/2013- no pulmonary abnormalities, coronary atherosclerosis Chest x-ray 04/22/2019- no active cardiopulmonary disease CTA 06/10/2019- pulmonary embolism with extensive bilateral thrombus.  Questionable thrombus in the IVC and right inferior pulmonary vein. I have reviewed the images personally  Lower extremity ultrasound 06/01/2019-no DVT  Labs: CBC 06/09/2019-WBC 5.5, eos 1.9%, absolute eosinophil count 104 CBC 06/10/2019-WBC 5.8, eos 1%, absolute eosinophil count 58 IgE 06/09/2019-204  SARS-CoV-2 04/23/2019-negative  Cardiac: Echocardiogram 06/10/2019- LVEF 55-60%.  Moderate concentric LVH.  RVSP 40  Assessment:  Pulmonary embolism Continue Eliquis for anticoagulation Would recommend indefinite anticoagulation as the PE appears to be unprovoked  Advised him to continue to exercise and get back into shape.  Plan/Recommendations: - Continue Eliquis  Marshell Garfinkel MD [facility] 09/15/2019, 1:44 PM  CC: Reynold Bowen, MD

## 2019-09-21 ENCOUNTER — Other Ambulatory Visit: Payer: Self-pay

## 2019-09-21 ENCOUNTER — Encounter: Payer: Self-pay | Admitting: Cardiology

## 2019-09-21 ENCOUNTER — Ambulatory Visit: Payer: Medicare Other | Admitting: Cardiology

## 2019-09-21 VITALS — BP 142/83 | HR 61 | Temp 96.8°F | Ht 70.0 in | Wt 238.0 lb

## 2019-09-21 DIAGNOSIS — I2510 Atherosclerotic heart disease of native coronary artery without angina pectoris: Secondary | ICD-10-CM

## 2019-09-21 DIAGNOSIS — I10 Essential (primary) hypertension: Secondary | ICD-10-CM

## 2019-09-21 DIAGNOSIS — Z7189 Other specified counseling: Secondary | ICD-10-CM

## 2019-09-21 DIAGNOSIS — E785 Hyperlipidemia, unspecified: Secondary | ICD-10-CM

## 2019-09-21 DIAGNOSIS — Z86711 Personal history of pulmonary embolism: Secondary | ICD-10-CM

## 2019-09-21 DIAGNOSIS — I255 Ischemic cardiomyopathy: Secondary | ICD-10-CM

## 2019-09-21 NOTE — Progress Notes (Signed)
Cardiology Office Note:    Date:  09/21/2019   ID:  Jesse Coleman, DOB [birth year], MRN [id number]  PCP:  Reynold Bowen, MD  Cardiologist:  Buford Dresser, MD PhD  Referring MD: Reynold Bowen, MD   OI:NOMVEH up  History of Present Illness:    Jesse Coleman is a 63 y.o. male with a hx of LBBB, CAD (2014), HTN, obesity who is seen for follow up. I met him during a hospitalization for anaphylaxis, for which he was discharged on 04/24/2019. Notes reviewed. He had demand ischemia during this event 2/2 anaphylaxis.  Cardiac history, obtained from the patient and Care Everywhere: -History of chest pain starting 06/2012, progressive. Symptoms were burning chest pain that spread to shoulders, arms, and back. Worse with exertion, better with rest. Associated with shortness of breath and diaphoresis -Echo stress test 01/2013 showed LBBB, EF 40-45% with global hypokinesis.  -Cath 01/2013 showed small caliber CAD in nondominant RCA, diagonal, and OM not amenable to PCI.  -treated with medical therapy (metoprolol, aspirin, atorvastatin, ranolazine). Declined imdur due to interaction with PDE5 inhibitors. Had SL NG, felt terrible when he took this. -amlodipine added by Dr. Baron Hamper at [facility], also recommended medical management. Amlodipine stopped due to LE edema. Metoprolol succinate uptitrated.   Today: Breathing has been good, no chest pain. Can spend 35-55 min on treadmill at >3 mph, walks at a good pace with his dog without issue.   Wakes up multiple times/night, not a breathing issue, just can't sleep.   Doing well on blood thinner, no issues with significant bleeding.   Has been checking BP, O2, pulse at home. Usually 120s/70s.   Hasn't met with Dr. Forde Dandy yet to talk about pain medications. We discussed covid and the vaccinations, gave my recommendations but defer to his pulm/allergy team.  Denies chest pain, shortness of breath at rest or with normal exertion. No PND,  orthopnea, LE edema or unexpected weight gain. No syncope or palpitations.  Past Medical History:  Diagnosis Date  . CAD (coronary artery disease)   . Erectile dysfunction   . Hematospermia   . Hyperlipidemia   . Hypertension 2014  . Overweight(278.02)   . 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 CARDIAC CATHETERIZATION  2014   LAD 40%, D1&D2 70-80% (1.58m), CFX 20%, OM1 & OM2 90% (1.O mm), OM3 50%, RCA 90% (1.5-1.75 mm), EF 40-45%, med rx  . [place]  [address] [place]  [place]  [place]    . REFRACTIVE SURGERY Right 2003  . VASECTOMY  2003    Current Medications: Current Outpatient Medications on File Prior to Visit  Medication Sig  . acetaminophen (TYLENOL 8 HOUR ARTHRITIS PAIN) 650 MG CR tablet Take 650 mg by mouth 2 (two) times daily as needed for pain.   .Marland Kitchenapixaban (ELIQUIS) 5 MG TABS tablet Take 1 tablet (5 mg total) by mouth 2 (two) times daily.  .Marland Kitchenaspirin EC 81 MG tablet Take 81 mg by mouth daily.  . bromocriptine (PARLODEL) 2.5 MG tablet Take 1.25 mg by mouth at bedtime.   .Marland KitchenbuPROPion (WELLBUTRIN XL) 150 MG 24 hr tablet Take 150 mg by mouth at bedtime.   . cholecalciferol (VITAMIN D3) 25 MCG (1000 UT) tablet Take 1,000 Units by mouth at bedtime.   .Marland KitchenEPINEPHrine 0.3 mg/0.3 mL IJ  SOAJ injection Inject 0.3 mLs (0.3 mg total) into the muscle as needed for anaphylaxis.  . famotidine (PEPCID) 20 MG tablet Take 1 tablet (20 mg total) by mouth 2 (two) times daily.  . isosorbide dinitrate (ISORDIL) 30 MG tablet Take 30 mg by mouth 2 (two) times daily.   Marland Kitchen LORazepam (ATIVAN) 1 MG tablet Take 1 mg by mouth every 8 (eight) hours as needed for anxiety.   . metoprolol succinate (TOPROL-XL) 100 MG 24 hr tablet Take 100 mg by mouth daily. Take with or immediately following a meal.  . Multiple Vitamin (MULTIVITAMIN)  capsule Take 1 capsule by mouth at bedtime.   Marland Kitchen NITROSTAT 0.4 MG SL tablet Place 0.4 mg under the tongue every 5 (five) minutes as needed for chest pain.   . sildenafil (VIAGRA) 100 MG tablet TAKE 1 TABLET BY MOUTH 30CMINUTES PRIOR TO 1HR BEFORE RELATIONS.  Marland Kitchen vitamin B-12 (CYANOCOBALAMIN) 1000 MCG tablet Take 1,000 mcg by mouth at bedtime.    No current facility-administered medications on file prior to visit.     Allergies:   Latex   Social History   Tobacco Use  . Smoking status: Never Smoker  . Smokeless tobacco: Never Used  Substance Use Topics  . Alcohol use: Yes    Alcohol/week: 1.0 standard drinks    Types: 1 drink(s) per week  . Drug use: No    Family History: The patient's family history includes Coronary artery disease (age of onset: 65) in his father; Emphysema in his mother.  ROS:   Please see the history of present illness.  Additional pertinent ROS: Constitutional: Negative for chills, fever, night sweats, unintentional weight loss  HENT: Negative for ear pain and hearing loss.   Eyes: Negative for loss of vision and eye pain.  Respiratory: Negative for cough, sputum, wheezing.   Cardiovascular: See HPI. Gastrointestinal: Negative for abdominal pain, melena, and hematochezia.  Genitourinary: Negative for dysuria and hematuria.  Musculoskeletal: Negative for falls and myalgias.  Skin: Negative for itching and rash.  Neurological: Negative for focal weakness, focal sensory changes and loss of consciousness.  Endo/Heme/Allergies: Does not bruise/bleed easily.    EKGs/Labs/Other Studies Reviewed:    The following studies were reviewed today: Echo 06/10/19  1. The left ventricle has normal systolic function, with an ejection fraction of 55-60%. There is moderate concentric left ventricular hypertrophy. Left ventricular diastolic function could not be evaluated.  2. The right ventricle has moderately reduced systolic function. The cavity was moderately enlarged.  Right ventricular systolic pressure is mildly elevated with an estimated pressure of 40.3 mmHg.  3. Left atrial size was moderately dilated.  4. Right atrial size was moderately dilated.  5. Mild thickening of the mitral valve leaflet. No evidence of mitral valve stenosis.  6. Tricuspid valve regurgitation is moderate.  7. The aortic valve is tricuspid Moderate thickening of the aortic valve Moderate calcification of the aortic valve. Aortic valve regurgitation is trivial by color flow Doppler. No stenosis of the aortic valve.  EKG:  EKG is personally reviewed.  The ekg ordered 06/11/19 demonstrates NSR, iLBBB, first degree AV block  Recent Labs: 04/23/2019: ALT 10 04/24/2019: Magnesium 2.0 06/09/2019: Pro B Natriuretic peptide (BNP) 102.0 06/10/2019: B Natriuretic Peptide 111.6 06/12/2019: BUN 12; Creatinine, Ser 1.45; Potassium 4.3; Sodium 138 06/13/2019: Hemoglobin 13.9; Platelets 154  Recent Lipid Panel No results found for: CHOL, TRIG, HDL, CHOLHDL, VLDL, LDLCALC, LDLDIRECT  Physical Exam:    VS:  BP (!) 142/83   Pulse 61  Temp (!) 96.8 F (36 C)   Ht '5\' 10"'  (1.778 m)   Wt 238 lb (108 kg)   SpO2 96%   BMI 34.15 kg/m     Wt Readings from Last 3 Encounters:  09/21/19 238 lb (108 kg)  09/15/19 240 lb (108.9 kg)  07/14/19 234 lb 4.8 oz (106.3 kg)    GEN: Well nourished, well developed in no acute distress HEENT: Normal, moist mucous membranes NECK: No JVD CARDIAC: regular rhythm, normal S1 and S2, no rubs or gallops. No murmur. VASCULAR: Radial and DP pulses 2+ bilaterally. No carotid bruits RESPIRATORY:  Clear to auscultation without rales, wheezing or rhonchi  ABDOMEN: Soft, non-tender, non-distended MUSCULOSKELETAL:  Ambulates independently SKIN: Warm and dry, no edema NEUROLOGIC:  Alert and oriented x 3. No focal neuro deficits noted. PSYCHIATRIC:  Normal affect   ASSESSMENT:    1. Coronary artery disease involving native coronary artery of native heart without angina  pectoris   2. Ischemic cardiomyopathy   3. Essential hypertension   4. Educated about COVID-19 virus infection   5. Hyperlipidemia LDL goal <70   6. History of pulmonary embolism    PLAN:    CAD, with history of refractory angina: now without angina -will stop aspirin since plan for apixaban is indefinite -continue imdur, feels better on BID dosing -continue metoprolol succinate -amlodipine stopped while admitted. Currently chest pain free, but consider restarting if angina recurs -on ranexa in the past, unclear why he stopped -counseled that NSAIDs are contraindicated in CAD, and he plans to talk to Dr. Forde Dandy about alternative pain management options -has sildenafil on med list. Counseled on risk of interaction with SL NG, imdur. Not currently taking.  Ischemic cardiomyopathy: now with recovered EF -continue metoprolol succinate -was on lisinopril in the past, held during admission for AKI. See below -NYHA class 1, euvolemic today, excellent exercise tolerance -encouraged continued exercise  Hypertension: just above goal here, home readings at goal -continue current medications -if needed, consider adding back ACEi/ARB, will need to follow kidney function  Hyperlipidemia: goal LDL <70.  -most recent LDL 121 05/2019 -intolerant of atorvastatin and rosuvastatin in the past (myalgia) -he is not sure why he stopped ezetimibe. We discussed retrial, he wants to hold on this -nervous re: PCSK9 inhibitors, both cost and side effects -discussed nexletol pros/cons. I will ask if we can estimate what his cost might be on this.  History of pulmonary embolism: -on apixaban 5 mg BID -unprovoked. Hypercoagulable workup by Dr. Delton Coombes unrevealing.   Cardiac risk counseling and prevention recommendations: -recommend heart healthy/Mediterranean diet, with whole grains, fruits, vegetable, fish, lean meats, nuts, and olive oil. Limit salt. -recommend moderate walking, 3-5 times/week for 30-50  minutes each session. Aim for at least 150 minutes.week. Goal should be pace of 3 miles/hours, or walking 1.5 miles in 30 minutes -recommend avoidance of tobacco products. Avoid excess alcohol.  Discussion re: Covid. I told him it is not my area of expertise, but my opinion would be that with unexplained anaphylaxis, I would be cautious about getting the covid vaccine unless he could have a trial/patch test/etc to make sure he would tolerate. I recommended he discuss with his other specialists to get a specific recommendation. I am very in favor of the vaccines in general but do not want to precipitate another episode of anaphylaxis.  Plan for follow up: 1 year or sooner if angina recurs  TIME SPENT WITH PATIENT: 25 minutes of direct patient care. More than 50% of  that time was spent on coordination of care and counseling regarding symptoms, medication management, covid.  Buford Dresser, MD, PhD [place]  [facility]   Medication Adjustments/Labs and Tests Ordered: Current medicines are reviewed at length with the patient today.  Concerns regarding medicines are outlined above.  No orders of the defined types were placed in this encounter.  No orders of the defined types were placed in this encounter.   Patient Instructions  Medication Instructions:  Stop Aspirin 81 mg daily  *If you need a refill on your cardiac medications before your next appointment, please call your pharmacy*  Lab Work: None  Testing/Procedures: None  Follow-Up: At [facility], you and your health needs are our priority.  As part of our continuing mission to provide you with exceptional heart care, we have created designated Provider Care Teams.  These Care Teams include your primary Cardiologist (physician) and Advanced Practice Providers (APPs -  Physician Assistants and Nurse Practitioners) who all work together to provide you with the care you need, when you need it.  Your next  appointment:   1 year(s)  The format for your next appointment:   In Person  Provider:   Buford Dresser, MD       Signed, Buford Dresser, MD PhD 09/21/2019  [place]

## 2019-09-21 NOTE — Patient Instructions (Signed)
Medication Instructions:  Stop Aspirin 81 mg daily  *If you need a refill on your cardiac medications before your next appointment, please call your pharmacy*  Lab Work: None  Testing/Procedures: None  Follow-Up: At [facility], you and your health needs are our priority.  As part of our continuing mission to provide you with exceptional heart care, we have created designated Provider Care Teams.  These Care Teams include your primary Cardiologist (physician) and Advanced Practice Providers (APPs -  Physician Assistants and Nurse Practitioners) who all work together to provide you with the care you need, when you need it.  Your next appointment:   1 year(s)  The format for your next appointment:   In Person  Provider:   Buford Dresser, MD

## 2019-09-23 ENCOUNTER — Encounter: Payer: Self-pay | Admitting: Cardiology

## 2019-09-23 DIAGNOSIS — Z86711 Personal history of pulmonary embolism: Secondary | ICD-10-CM | POA: Insufficient documentation

## 2019-10-19 ENCOUNTER — Other Ambulatory Visit: Payer: Self-pay | Admitting: Pulmonary Disease

## 2019-10-21 IMAGING — CT CT ANGIO CHEST
1 of 2 series · 17 of 32 positions shown · IV contrast (APPLIED)
Comparison: CT chest 09/28/2013. Portable chest 04/22/2019.
COMPARISON: CT chest 09/28/2013. Portable chest 04/22/2019.

Addendum:
CLINICAL DATA: 63-year-old male with shortness of breath, chest
pain and abnormal D-dimer.

Creatinine was obtained on site at [HOSPITAL] at [HOSPITAL].
Results: Creatinine 1.5 mg/dL.
EXAM:
CT ANGIOGRAPHY CHEST WITH CONTRAST
TECHNIQUE: Multidetector CT imaging of the chest was performed using the
standard protocol during bolus administration of intravenous
contrast. Multiplanar CT image reconstructions and MIPs were
obtained to evaluate the vascular anatomy.
CONTRAST:  75mL 6L8Q5C-3EQ IOPAMIDOL (6L8Q5C-3EQ) INJECTION 76%

[Series 5: thins 1.0 b31s · axial · 0.88mm/px · z∈[-360,-36]mm · 17 of 362 slices shown]
[im 19/362  lung]
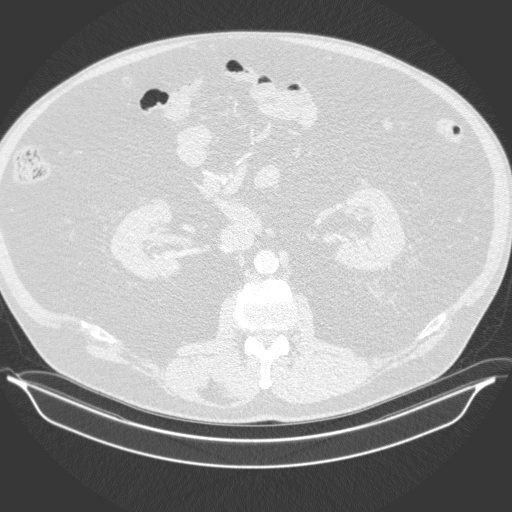
[im 37/362  mediastinal]
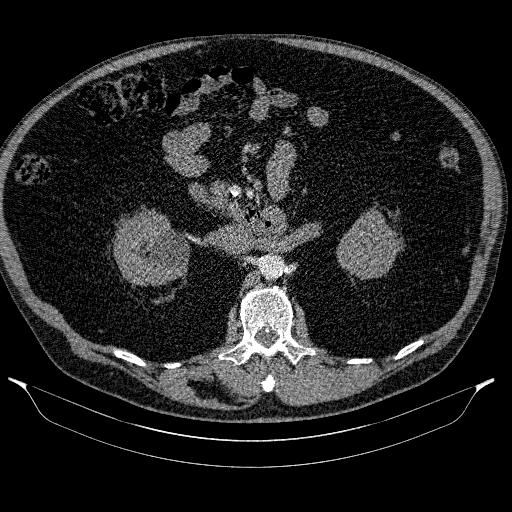
[im 73/362  lung]
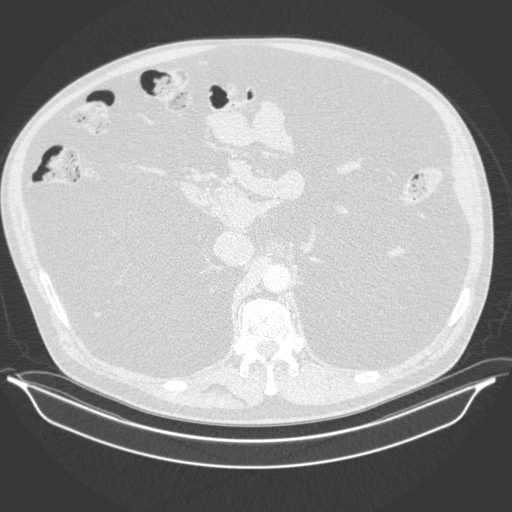
[im 91/362  mediastinal]
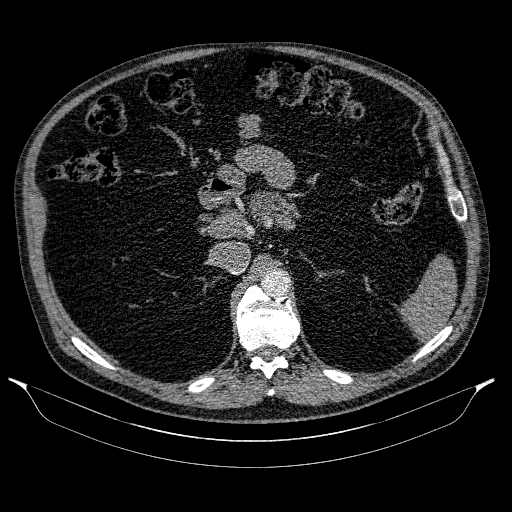
[im 109/362  lung]
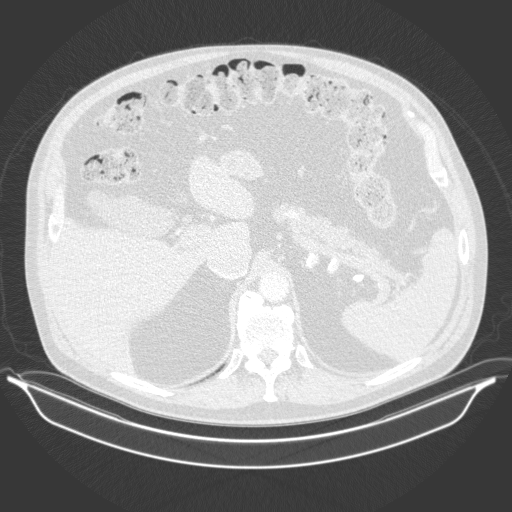
[im 127/362  mediastinal]
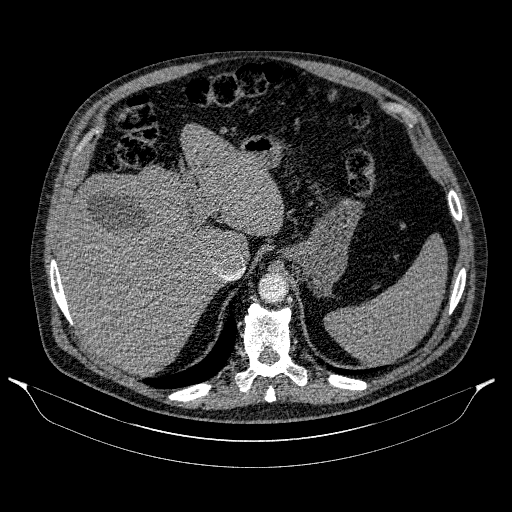
[im 145/362  lung]
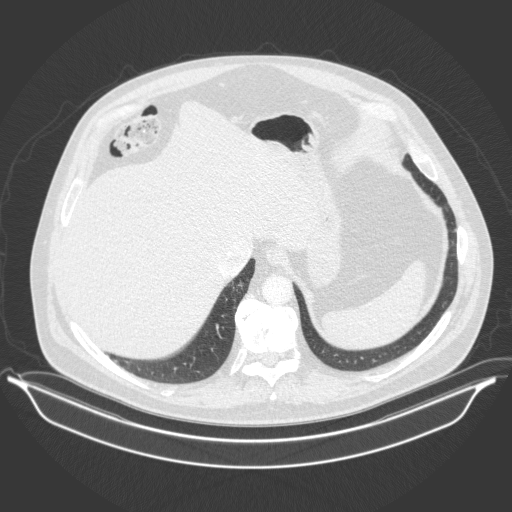
[im 163/362  mediastinal]
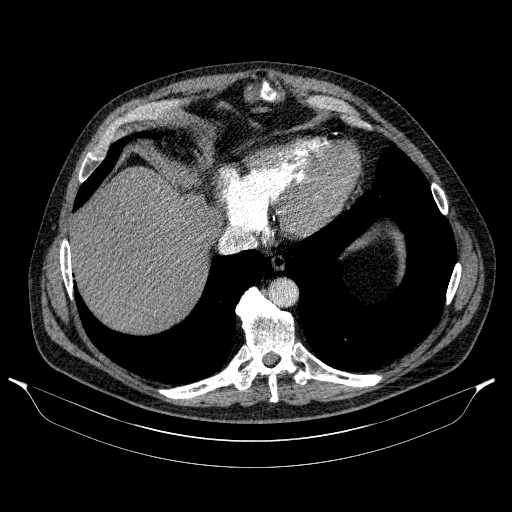
[im 181/362  lung]
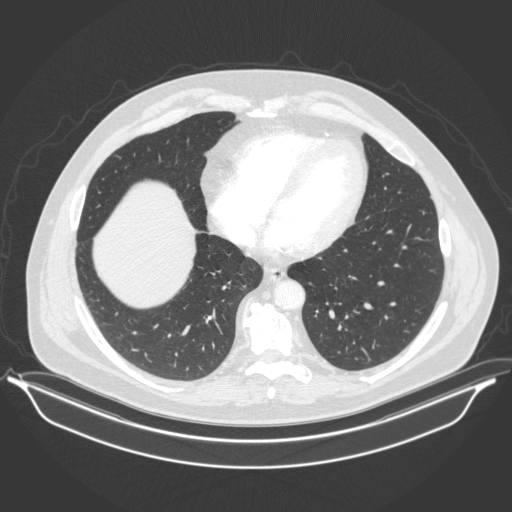
[im 199/362  mediastinal]
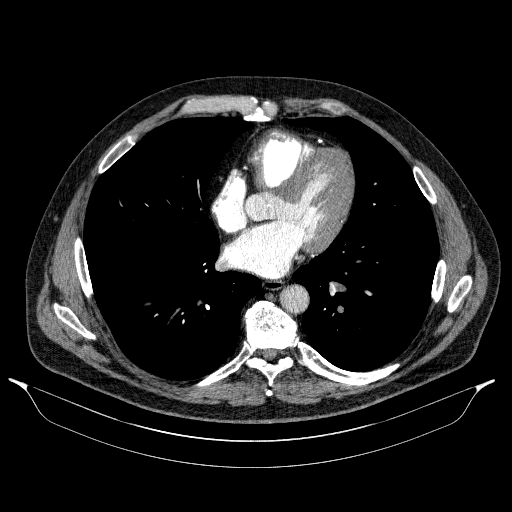
[im 217/362  lung]
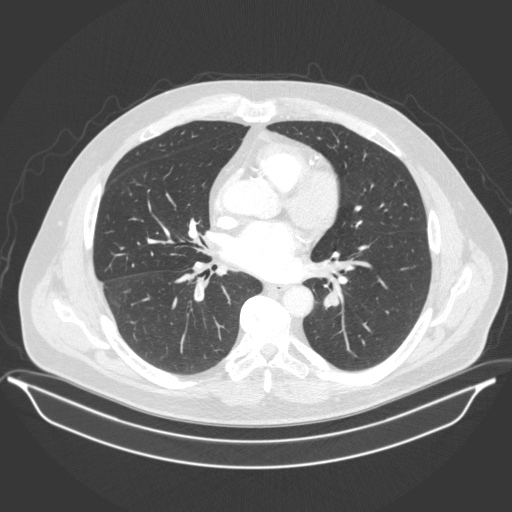
[im 235/362  mediastinal]
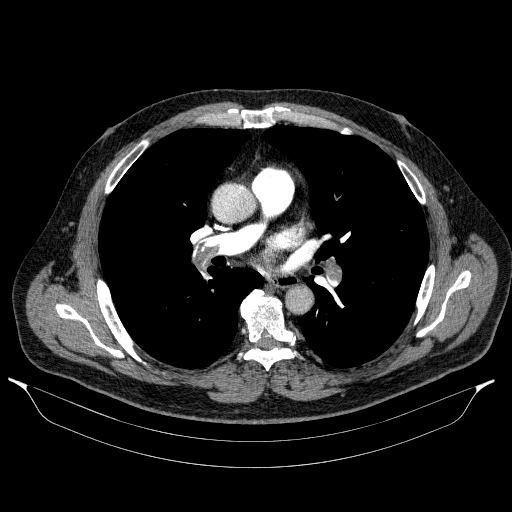
[im 253/362  lung]
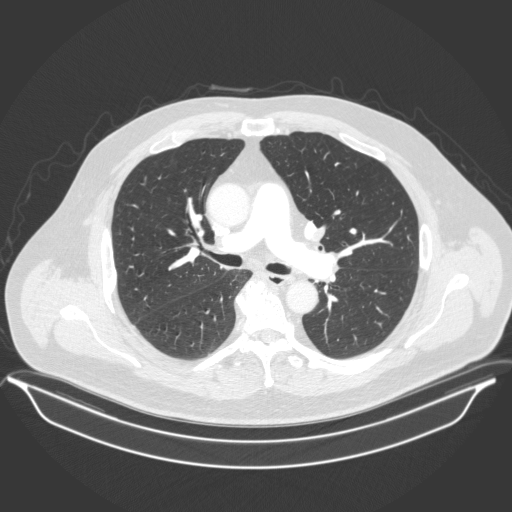
[im 271/362  mediastinal]
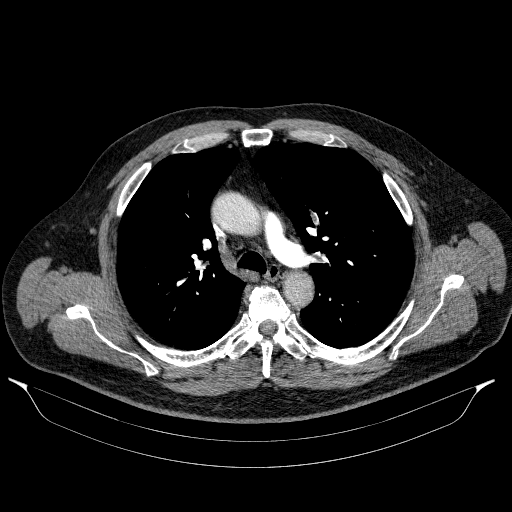
[im 289/362  lung]
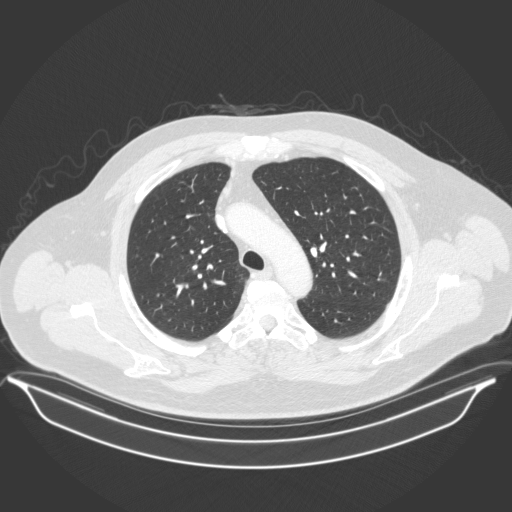
[im 325/362  mediastinal]
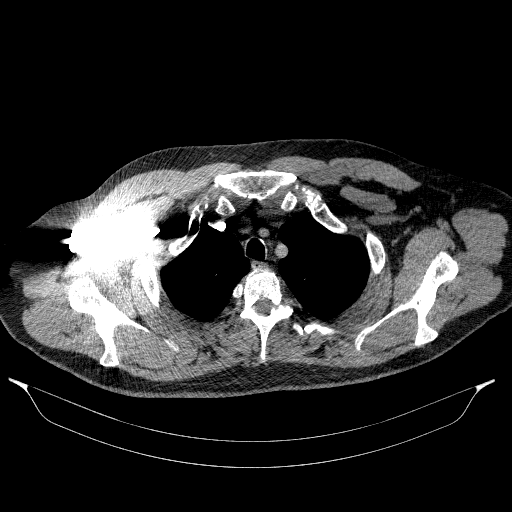
[im 343/362  lung]
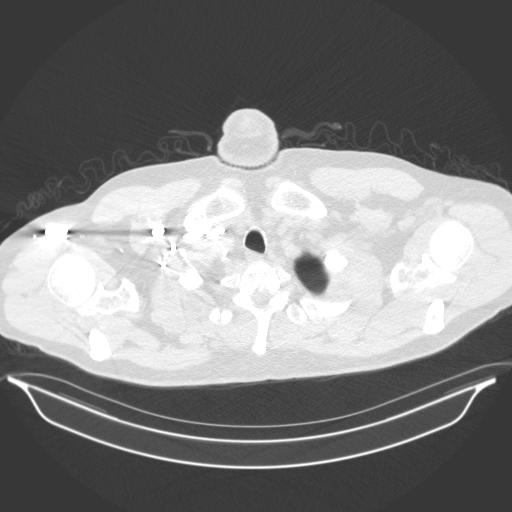

[17 of 32 positions shown; findings below may reference images not displayed]

FINDINGS: Cardiovascular: Good contrast bolus timing in the pulmonary arterial
tree. Positive for pulmonary artery thrombus at the left hilum. Clot
extends into the left upper lobe and lingula branches (series 5,
image 112). Bulky thrombus in the left lower lobe pulmonary artery
branches

No saddle embolus, but there is also bulky thrombus at the distal
right main pulmonary artery extending into the right upper lobe and
right lower lobe branches.

Additionally there is inflow artifact versus thrombus in the right
inferior pulmonary vein on series 5, image 158.

RV / LV ratio = 0.9-1. Overall cardiac size is stable since 7261
with no pericardial effusion. Extensive calcified coronary artery
atherosclerosis and/or stents.

Grossly negative aorta.

Mediastinum/Nodes: No mediastinal lymphadenopathy.

Lungs/Pleura: Major airways are patent. No pleural effusion. No
pulmonary infarct. No abnormal pulmonary opacity.

Upper Abdomen: Difficult to exclude thrombus in the IVC on series 4,
image 90, although the IVC has a similar size and caliber to the
7261 comparison. Negative visible liver, gallbladder, spleen,
pancreas, adrenal glands and bowel in the upper abdomen. Right upper
pole renal cyst with simple fluid density incidentally noted.

Musculoskeletal: Bulky and flowing endplate osteophytes throughout
the thoracic spine. No acute osseous abnormality identified.

Review of the MIP images confirms the above findings.
IMPRESSION: 1. Positive for acute pulmonary artery with fairly extensive
bilateral thrombus.
No saddle embolus, but suspicion of right heart strain on the basis
of abnormal RV/LV ratio.
2. No pulmonary infarct or pleural effusion.
3. Questionable thrombus in the IVC and the right inferior pulmonary
vein.
4. Calcified coronary artery atherosclerosis.

ADDENDUM:
Critical Value/emergent results were called by telephone at the time
of interpretation on 06/10/2019 at 6314 hours to [place]
MOOLMAN , who verbally acknowledged these results.

*** End of Addendum ***
FINDINGS: Cardiovascular: Good contrast bolus timing in the pulmonary arterial
tree. Positive for pulmonary artery thrombus at the left hilum. Clot
extends into the left upper lobe and lingula branches (series 5,
image 112). Bulky thrombus in the left lower lobe pulmonary artery
branches

No saddle embolus, but there is also bulky thrombus at the distal
right main pulmonary artery extending into the right upper lobe and
right lower lobe branches.

Additionally there is inflow artifact versus thrombus in the right
inferior pulmonary vein on series 5, image 158.

RV / LV ratio = 0.9-1. Overall cardiac size is stable since 7261
with no pericardial effusion. Extensive calcified coronary artery
atherosclerosis and/or stents.

Grossly negative aorta.

Mediastinum/Nodes: No mediastinal lymphadenopathy.

Lungs/Pleura: Major airways are patent. No pleural effusion. No
pulmonary infarct. No abnormal pulmonary opacity.

Upper Abdomen: Difficult to exclude thrombus in the IVC on series 4,
image 90, although the IVC has a similar size and caliber to the
7261 comparison. Negative visible liver, gallbladder, spleen,
pancreas, adrenal glands and bowel in the upper abdomen. Right upper
pole renal cyst with simple fluid density incidentally noted.

Musculoskeletal: Bulky and flowing endplate osteophytes throughout
the thoracic spine. No acute osseous abnormality identified.

Review of the MIP images confirms the above findings.
IMPRESSION: 1. Positive for acute pulmonary artery with fairly extensive
bilateral thrombus.
No saddle embolus, but suspicion of right heart strain on the basis
of abnormal RV/LV ratio.
2. No pulmonary infarct or pleural effusion.
3. Questionable thrombus in the IVC and the right inferior pulmonary
vein.
4. Calcified coronary artery atherosclerosis.

## 2019-11-15 ENCOUNTER — Other Ambulatory Visit: Payer: Self-pay | Admitting: Pulmonary Disease

## 2019-12-23 ENCOUNTER — Inpatient Hospital Stay (HOSPITAL_COMMUNITY): Payer: Medicare Other | Attending: Hematology

## 2019-12-23 ENCOUNTER — Other Ambulatory Visit: Payer: Self-pay

## 2019-12-23 DIAGNOSIS — I2699 Other pulmonary embolism without acute cor pulmonale: Secondary | ICD-10-CM | POA: Diagnosis present

## 2019-12-23 LAB — D-DIMER, QUANTITATIVE: D-Dimer, Quant: <0.27 ug/mL-FEU (ref 0.00–0.50)

## 2019-12-30 ENCOUNTER — Inpatient Hospital Stay (HOSPITAL_COMMUNITY): Payer: Medicare Other | Attending: Hematology | Admitting: Hematology

## 2019-12-30 ENCOUNTER — Other Ambulatory Visit: Payer: Self-pay

## 2019-12-30 DIAGNOSIS — Z832 Family history of diseases of the blood and blood-forming organs and certain disorders involving the immune mechanism: Secondary | ICD-10-CM | POA: Diagnosis not present

## 2019-12-30 DIAGNOSIS — Z7901 Long term (current) use of anticoagulants: Secondary | ICD-10-CM | POA: Diagnosis not present

## 2019-12-30 DIAGNOSIS — I2699 Other pulmonary embolism without acute cor pulmonale: Secondary | ICD-10-CM | POA: Diagnosis not present

## 2019-12-30 NOTE — Progress Notes (Signed)
Pt is not having any pain today.  He is not having any shortness of breath or coughing, and denies chest pain or swelling.  He is having regular BMs  (no nausea or vomiting) and no problems with urination.  He denies headaches, dizziness, or numbness.  His appetite is 100% and his energy levels are 100%.  He has not had any new health problems or any scans since his last visit to our office.

## 2019-12-30 NOTE — Patient Instructions (Signed)
[place] at [facility]  Discharge Instructions:  You had a telephone visit with Dr. Delton Coombes today. _______________________________________________________________  Thank you for choosing [place] at [facility] to provide your oncology and hematology care.  To afford each patient quality time with our providers, please arrive at least 15 minutes before your scheduled appointment.  You need to re-schedule your appointment if you arrive 10 or more minutes late.  We strive to give you quality time with our providers, and arriving late affects you and other patients whose appointments are after yours.  Also, if you no show three or more times for appointments you may be dismissed from the clinic.  Again, thank you for choosing [place] at [place] hope is that these requests will allow you access to exceptional care and in a timely manner. _______________________________________________________________  If you have questions after your visit, please contact our office at [phone] between the hours of 8:30 a.m. and 5:00 p.m. Voicemails left after 4:30 p.m. will not be returned until the following business day. _______________________________________________________________  For prescription refill requests, have your pharmacy contact our office. _______________________________________________________________  Recommendations made by the consultant and any test results will be sent to your referring physician. _______________________________________________________________

## 2020-01-03 NOTE — Progress Notes (Signed)
Virtual Visit via Telephone Note  I connected with Zolan Uttley on 01/03/20 at  4:05 PM EDT by telephone and verified that I am speaking with the correct person using two identifiers.   I discussed the limitations, risks, security and privacy concerns of performing an evaluation and management service by telephone and the availability of in person appointments. I also discussed with the patient that there may be a patient responsible charge related to this service. The patient expressed understanding and agreed to proceed.   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  Hypercoagulable work-up done during hospitalization was negative for factor V Leiden, prothrombin gene mutation.  Antithrombin III was normal.  Lupus anticoagulant was negative.  Anticardiolipin antibody and antibeta-2 glycoprotein 1 antibody was negative.   Observations/Objective: He is tolerating Eliquis very well.  Denies any pleuritic chest pain or lightheadedness.  No bleeding per rectum or melena.  No hematuria noted.  Denies any fevers, night sweats or weight loss.  Assessment and Plan:  1.  Unprovoked pulmonary embolism: -Additional work-up including JAK2 V617F mutation testing was negative. -His estranged son also had DVT and some abnormality with protein S. -We could not check protein S as he is on Eliquis. -He is tolerating Eliquis without any issues. -We reviewed D-dimer which was undetectable. -He will come back in 6 months for follow-up with repeat labs.   Follow Up Instructions: RTC 6 months with labs.   I discussed the assessment and treatment plan with the patient. The patient was provided an opportunity to ask questions and all were answered. The patient agreed with the plan and demonstrated an understanding of the  instructions.   The patient was advised to call back or seek an in-person evaluation if the symptoms worsen or if the condition fails to improve as anticipated.  I provided 7 minutes of non-face-to-face time during this encounter.   Derek Jack, MD

## 2020-07-04 ENCOUNTER — Telehealth: Payer: Self-pay | Admitting: Cardiology

## 2020-07-04 NOTE — Telephone Encounter (Signed)
lvm for patient to return call to get follow up scheduled with Christopher from recall list 

## 2020-07-31 ENCOUNTER — Other Ambulatory Visit (HOSPITAL_COMMUNITY): Payer: Self-pay | Admitting: Surgery

## 2020-07-31 DIAGNOSIS — I2699 Other pulmonary embolism without acute cor pulmonale: Secondary | ICD-10-CM

## 2020-08-01 ENCOUNTER — Inpatient Hospital Stay (HOSPITAL_COMMUNITY): Payer: Medicare Other | Attending: Hematology

## 2020-08-01 ENCOUNTER — Other Ambulatory Visit: Payer: Self-pay

## 2020-08-01 DIAGNOSIS — Z79899 Other long term (current) drug therapy: Secondary | ICD-10-CM | POA: Insufficient documentation

## 2020-08-01 DIAGNOSIS — I10 Essential (primary) hypertension: Secondary | ICD-10-CM | POA: Diagnosis not present

## 2020-08-01 DIAGNOSIS — Z7901 Long term (current) use of anticoagulants: Secondary | ICD-10-CM | POA: Diagnosis not present

## 2020-08-01 DIAGNOSIS — Z86711 Personal history of pulmonary embolism: Secondary | ICD-10-CM | POA: Diagnosis not present

## 2020-08-01 DIAGNOSIS — I2699 Other pulmonary embolism without acute cor pulmonale: Secondary | ICD-10-CM

## 2020-08-01 LAB — CBC WITH DIFFERENTIAL/PLATELET
Abs Immature Granulocytes: 0.02 10*3/uL (ref 0.00–0.07)
Basophils Absolute: 0 10*3/uL (ref 0.0–0.1)
Basophils Relative: 1 %
Eosinophils Absolute: 0.2 10*3/uL (ref 0.0–0.5)
Eosinophils Relative: 3 %
HCT: 47.3 % (ref 39.0–52.0)
Hemoglobin: 15.1 g/dL (ref 13.0–17.0)
Immature Granulocytes: 0 %
Lymphocytes Relative: 29 %
Lymphs Abs: 1.7 10*3/uL (ref 0.7–4.0)
MCH: 31.9 pg (ref 26.0–34.0)
MCHC: 31.9 g/dL (ref 30.0–36.0)
MCV: 100 fL (ref 80.0–100.0)
Monocytes Absolute: 0.6 10*3/uL (ref 0.1–1.0)
Monocytes Relative: 11 %
Neutro Abs: 3.4 10*3/uL (ref 1.7–7.7)
Neutrophils Relative %: 56 %
Platelets: 180 10*3/uL (ref 150–400)
RBC: 4.73 MIL/uL (ref 4.22–5.81)
RDW: 12.9 % (ref 11.5–15.5)
WBC: 5.9 10*3/uL (ref 4.0–10.5)
nRBC: 0 % (ref 0.0–0.2)

## 2020-08-01 LAB — COMPREHENSIVE METABOLIC PANEL
ALT: 10 U/L (ref 0–44)
AST: 19 U/L (ref 15–41)
Albumin: 3.9 g/dL (ref 3.5–5.0)
Alkaline Phosphatase: 51 U/L (ref 38–126)
Anion gap: 6 (ref 5–15)
BUN: 18 mg/dL (ref 8–23)
CO2: 25 mmol/L (ref 22–32)
Calcium: 9.5 mg/dL (ref 8.9–10.3)
Chloride: 104 mmol/L (ref 98–111)
Creatinine, Ser: 1.19 mg/dL (ref 0.61–1.24)
GFR, Estimated: >60 mL/min (ref >60)
Glucose, Bld: 100 mg/dL — ABNORMAL HIGH (ref 70–99)
Potassium: 4.4 mmol/L (ref 3.5–5.1)
Sodium: 135 mmol/L (ref 135–145)
Total Bilirubin: 0.8 mg/dL (ref 0.3–1.2)
Total Protein: 6.4 g/dL — ABNORMAL LOW (ref 6.5–8.1)

## 2020-08-01 LAB — LACTATE DEHYDROGENASE: LDH: 116 U/L (ref 98–192)

## 2020-08-08 ENCOUNTER — Other Ambulatory Visit: Payer: Self-pay

## 2020-08-08 ENCOUNTER — Inpatient Hospital Stay (HOSPITAL_COMMUNITY): Payer: Medicare Other | Admitting: Hematology

## 2020-08-08 VITALS — BP 169/90 | HR 66 | Temp 97.3°F | Resp 18 | Wt 246.4 lb

## 2020-08-08 DIAGNOSIS — I2699 Other pulmonary embolism without acute cor pulmonale: Secondary | ICD-10-CM

## 2020-08-08 DIAGNOSIS — Z86711 Personal history of pulmonary embolism: Secondary | ICD-10-CM | POA: Diagnosis not present

## 2020-08-08 NOTE — Progress Notes (Signed)
[place], [place] [address]   CLINIC:  Medical Oncology/Hematology  PCP:  Reynold Bowen, [place] / [place] [address]  [phone]  REASON FOR VISIT:  Follow-up for unprovoked pulmonary embolism  PRIOR THERAPY: None  CURRENT THERAPY: Eliquis daily  INTERVAL HISTORY:  [name], a 64 y.o. male, returns for routine follow-up for his unprovoked pulmonary embolism. Sem was last contacted via telephone on 12/30/2019.  Today he reports feeling well. He denies having any nosebleeds, hematochezia or hematuria. He denies having recent F/C, night sweats, unexplained weight loose or infections. He denies having any breathing issues or SOB, though he gets angina with exertion.  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 He saw ENT at [facility] on 11/8 for his hearing issues and will be checked by Dr. Thornell Mule.   REVIEW OF SYSTEMS:  Review of Systems  Constitutional: Positive for fatigue (70%). Negative for appetite change, chills, diaphoresis, fever and unexpected weight change.  HENT:   Negative for nosebleeds.   Respiratory: Positive for chest tightness (w/ exertion). Negative for shortness of breath.   Gastrointestinal: Negative for blood in stool.  Genitourinary: Negative for hematuria.   Neurological: Positive for dizziness (occasional).  Psychiatric/Behavioral: Positive for sleep disturbance.  All other systems reviewed and are negative.   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   Medical therapy-1999   Past Surgical History:  Procedure Laterality Date   CARDIAC CATHETERIZATION  2014   LAD 40%, D1&D2 70-80% (1.63mm), CFX 20%, OM1 & OM2 90% (1.O mm), OM3 50%, RCA 90%  (1.5-1.75 mm), EF 40-45%, med rx   COLONOSCOPY  2008   [place], [place] implant  2007   HERNIA REPAIR  2010   [place], [place]     REFRACTIVE SURGERY Right 2003   VASECTOMY  2003    SOCIAL HISTORY:  Social History   Socioeconomic History   Marital status: Married    Spouse name: Not on file   Number of children: 2   Years of education: Not on file   Highest education level: Not on file  Occupational History   Occupation: Real estate Scientist, clinical (histocompatibility and immunogenetics): [employer]   Tobacco Use   Smoking status: Never Smoker   Smokeless tobacco: Never Used  Substance and Sexual Activity   Alcohol use: Yes    Alcohol/week: 1.0 standard drink    Types: 1 drink(s) per week   Drug use: No   Sexual activity: Not on file  Other Topics Concern   Not on file  Social History Narrative   Not on file   Social Determinants of Health   Financial Resource Strain:    Difficulty of Paying Living Expenses: Not on file  Food Insecurity:    Worried About [place] in the Last Year: Not on file   Ran Out of Food in the Last Year: Not on file  Transportation Needs:    Lack of Transportation (Medical): Not on file   Lack of Transportation (Non-Medical): Not on file  Physical Activity:    Days of Exercise per Week: Not on file   Minutes of Exercise per Session:  Not on file  Stress:    Feeling of Stress : Not on file  Social Connections:    Frequency of Communication with Friends and Family: Not on file   Frequency of Social Gatherings with Friends and Family: Not on file   Attends Religious Services: Not on file   Active Member of Clubs or Organizations: Not on file   Attends Archivist Meetings: Not on file   Marital Status: Not on file  Intimate Partner Violence:    Fear of Current or Ex-Partner: Not on file   Emotionally Abused: Not on file   Physically Abused: Not on file   Sexually Abused: Not  on file    FAMILY HISTORY:  Family History  Problem Relation Age of Onset   Coronary artery disease Father 45       sudden cardiac death   Emphysema Mother        smoked    CURRENT MEDICATIONS:  Current Outpatient Medications  Medication Sig Dispense Refill   acetaminophen (TYLENOL 8 HOUR ARTHRITIS PAIN) 650 MG CR tablet Take 650 mg by mouth 2 (two) times daily as needed for pain.      bromocriptine (PARLODEL) 2.5 MG tablet Take 1.25 mg by mouth at bedtime.      buPROPion (WELLBUTRIN XL) 150 MG 24 hr tablet Take 150 mg by mouth at bedtime.      cholecalciferol (VITAMIN D3) 25 MCG (1000 UT) tablet Take 1,000 Units by mouth at bedtime.      ELIQUIS 5 MG TABS tablet TAKE 1 TABLET BY MOUTH TWO TIMES A DAY 60 tablet 12   EPINEPHrine 0.3 mg/0.3 mL IJ SOAJ injection Inject 0.3 mLs (0.3 mg total) into the muscle as needed for anaphylaxis. 1 each 0   famotidine (PEPCID) 20 MG tablet Take 1 tablet (20 mg total) by mouth 2 (two) times daily. 60 tablet 0   isosorbide dinitrate (ISORDIL) 30 MG tablet Take 30 mg by mouth 2 (two) times daily.      LORazepam (ATIVAN) 1 MG tablet Take 1 mg by mouth every 8 (eight) hours as needed for anxiety.      metoprolol succinate (TOPROL-XL) 100 MG 24 hr tablet Take 100 mg by mouth daily. Take with or immediately following a meal.     Multiple Vitamin (MULTIVITAMIN) capsule Take 1 capsule by mouth at bedtime.      NITROSTAT 0.4 MG SL tablet Place 0.4 mg under the tongue every 5 (five) minutes as needed for chest pain.      sildenafil (VIAGRA) 100 MG tablet TAKE 1 TABLET BY MOUTH 30CMINUTES PRIOR TO 1HR BEFORE RELATIONS.     vitamin B-12 (CYANOCOBALAMIN) 1000 MCG tablet Take 1,000 mcg by mouth at bedtime.      No current facility-administered medications for this visit.    ALLERGIES:  Allergies  Allergen Reactions   Latex Swelling and Other (See Comments)    Lips swell, but no breathing issues    PHYSICAL EXAM:  Performance status (ECOG):  0 - Asymptomatic  Vitals:   08/08/20 1517  BP: (!) 169/90  Pulse: 66  Resp: 18  Temp: (!) 97.3 F (36.3 C)  SpO2: 96%   Wt Readings from Last 3 Encounters:  08/08/20 246 lb 6.4 oz (111.8 kg)  09/21/19 238 lb (108 kg)  09/15/19 240 lb (108.9 kg)   Physical Exam Vitals reviewed.  Constitutional:      Appearance: Normal appearance. He is obese.  Cardiovascular:     Rate and  Rhythm: Normal rate and regular rhythm.     Pulses: Normal pulses.     Heart sounds: Normal heart sounds.  Pulmonary:     Effort: Pulmonary effort is normal.     Breath sounds: Normal breath sounds.  Abdominal:     Palpations: Abdomen is soft. There is no hepatomegaly, splenomegaly or mass.     Tenderness: There is no abdominal tenderness.     Hernia: No hernia is present.  Musculoskeletal:     Right lower leg: No edema.     Left lower leg: No edema.  Neurological:     General: No focal deficit present.     Mental Status: He is alert and oriented to person, place, and time.  Psychiatric:        Mood and Affect: Mood normal.        Behavior: Behavior normal.     LABORATORY DATA:  I have reviewed the labs as listed.  CBC Latest Ref Rng & Units 08/01/2020 06/13/2019 06/12/2019  WBC 4.0 - 10.5 K/uL 5.9 5.5 5.8  Hemoglobin 13.0 - 17.0 g/dL 15.1 13.9 13.8  Hematocrit 39 - 52 % 47.3 42.6 42.4  Platelets 150 - 400 K/uL 180 154 162   CMP Latest Ref Rng & Units 08/01/2020 06/12/2019 06/11/2019  Glucose 70 - 99 mg/dL 100(H) 85 80  BUN 8 - 23 mg/dL 18 12 14   Creatinine 0.61 - 1.24 mg/dL 1.19 1.45(H) 1.64(H)  Sodium 135 - 145 mmol/L 135 138 138  Potassium 3.5 - 5.1 mmol/L 4.4 4.3 4.3  Chloride 98 - 111 mmol/L 104 108 107  CO2 22 - 32 mmol/L 25 23 24   Calcium 8.9 - 10.3 mg/dL 9.5 9.4 9.5  Total Protein 6.5 - 8.1 g/dL 6.4(L) - -  Total Bilirubin 0.3 - 1.2 mg/dL 0.8 - -  Alkaline Phos 38 - 126 U/L 51 - -  AST 15 - 41 U/L 19 - -  ALT 0 - 44 U/L 10 - -      Component Value Date/Time   RBC 4.73 08/01/2020  1203   MCV 100.0 08/01/2020 1203   MCH 31.9 08/01/2020 1203   MCHC 31.9 08/01/2020 1203   RDW 12.9 08/01/2020 1203   LYMPHSABS 1.7 08/01/2020 1203   MONOABS 0.6 08/01/2020 1203   EOSABS 0.2 08/01/2020 1203   BASOSABS 0.0 08/01/2020 1203   Lab Results  Component Value Date   LDH 116 08/01/2020   LDH 131 07/14/2019    DIAGNOSTIC IMAGING:  I have independently reviewed the scans and discussed with the patient. No results found.   ASSESSMENT:  1.  Unprovoked pulmonary embolism: -Referred by Dr. Vaughan Browner for unprovoked pulmonary embolism. -CT angiogram on 06/10/2019-acute bilateral extensive pulmonary thrombus, no saddle thrombus, suspicion for right heart strain.  No pulmonary infarct/effusion.  Questionable thrombus in the IVC and right inferior pulmonary vein. -2D echo 10/18/2018 with EF 55-60%.  Right ventricle moderately reduced systolic function.  Cavity moderately enlarged. -Hypercoagulable work-up including factor V Leiden, prothrombin gene mutation, Antithrombin III, lupus anticoagulant, anticardiolipin antibody and beta-2 glycoprotein 1 antibody were negative. -Colonoscopy in 2007 in [place] was within normal limits.  He is a non-smoker. -Anaphylactic reaction in July 2020 lasting for 4 days with swelling of his abdomen and lower extremities. -JAK2 V617F testing was negative. -His estranged son also has DVT and some abnormality with protein S.   PLAN:  1.  Unprovoked pulmonary embolism: -He is tolerating Eliquis without any major problems. -D-dimer in March was negative.  Reviewed  labs from 08/01/2020 which showed normal CBC and LFTs. -Because of unprovoked significant pulmonary embolism, indefinite anticoagulation was recommended. -We will continue to monitor him once a year to assess risk-benefit ratio.  Orders placed this encounter:  No orders of the defined types were placed in this encounter.    Derek Jack, MD [place] [phone]   I, Milinda Antis, am acting as a scribe for Dr. Sanda Linger.  I, Derek Jack MD, have reviewed the above documentation for accuracy and completeness, and I agree with the above.

## 2020-08-08 NOTE — Patient Instructions (Signed)
[place] at [facility] Discharge Instructions  You were seen today by Dr. Delton Coombes. He went over your recent results. Continue being physically active as much as tolerable. Dr. Delton Coombes will see you back in 1 year for labs and follow up.   Thank you for choosing [place] at [facility] to provide your oncology and hematology care.  To afford each patient quality time with our provider, please arrive at least 15 minutes before your scheduled appointment time.   If you have a lab appointment with the [facility] please come in thru the Main Entrance and check in at the main information desk  You need to re-schedule your appointment should you arrive 10 or more minutes late.  We strive to give you quality time with our providers, and arriving late affects you and other patients whose appointments are after yours.  Also, if you no show three or more times for appointments you may be dismissed from the clinic at the providers discretion.     Again, thank you for choosing [facility].  Our hope is that these requests will decrease the amount of time that you wait before being seen by our physicians.       _____________________________________________________________  Should you have questions after your visit to [facility], please contact our office at [phone] between the hours of 8:00 a.m. and 4:30 p.m.  Voicemails left after 4:00 p.m. will not be returned until the following business day.  For prescription refill requests, have your pharmacy contact our office and allow 72 hours.    Cancer Center Support Programs:   > Cancer Support Group  2nd Tuesday of the month 1pm-2pm, Journey Room

## 2020-09-12 ENCOUNTER — Other Ambulatory Visit: Payer: Self-pay

## 2020-09-12 ENCOUNTER — Telehealth: Payer: Self-pay | Admitting: Pulmonary Disease

## 2020-09-12 ENCOUNTER — Ambulatory Visit (INDEPENDENT_AMBULATORY_CARE_PROVIDER_SITE_OTHER): Payer: Medicare Other | Admitting: Pulmonary Disease

## 2020-09-12 DIAGNOSIS — Z86711 Personal history of pulmonary embolism: Secondary | ICD-10-CM

## 2020-09-12 NOTE — Telephone Encounter (Signed)
Called and spoke with pt in regards to missed appt time and stated to him that we could reschedule that appt. Pt verbalized understanding. appt has been rescheduled for pt. Nothing further needed.

## 2020-09-12 NOTE — Progress Notes (Signed)
Opened in error. Patient cancelled and rescheduled appointment

## 2020-10-02 ENCOUNTER — Telehealth: Payer: Self-pay | Admitting: Cardiology

## 2020-10-02 NOTE — Telephone Encounter (Signed)
° ° °  Pt's wife said pt tested positive for covid and would like to get recommendations what to look out for since pt have heart issue, she would like to give symptoms to the nurse. She also said if they can change his appt to virtual appt

## 2020-10-02 NOTE — Telephone Encounter (Signed)
Please advise on patient mychart message as Dr.Mannam is not available until tomorrow. I sent message asking about symptoms.  i tested positive for COVID with a home test on 10/01/20. Please advise

## 2020-10-02 NOTE — Telephone Encounter (Signed)
Returned the call to the patient's wife per the dpr. She stated that the patient has been diagnosed with Covid by a home test yesterday. His symptoms started on 09/30/20. His current symptoms include low grade fever, dry cough, aches and pains, no appetite but has been drinking water and Gatorade.   Blood pressure this morning was 120/80, heart rate 60-70 and oxygen has been 93-94.  She has been made aware to let his PCP know which they have already done. She has also been advised to check his oxygen level periodically and make sure the patient stays hydrated.   He has an appointment on Wednesday which he would like to keep but do a MyChart video visit. She would also like to know if there are any other recommendations.

## 2020-10-03 NOTE — Telephone Encounter (Signed)
Appointment changed to virtual visit.  Pt notified.

## 2020-10-03 NOTE — Telephone Encounter (Signed)
Pt is scheduled for video visit with Dr. Cristal Deer tomorrow 10/04/20.

## 2020-10-04 ENCOUNTER — Encounter: Payer: Self-pay | Admitting: Cardiology

## 2020-10-04 ENCOUNTER — Telehealth (INDEPENDENT_AMBULATORY_CARE_PROVIDER_SITE_OTHER): Payer: Medicare Other | Admitting: Cardiology

## 2020-10-04 ENCOUNTER — Other Ambulatory Visit: Payer: Self-pay

## 2020-10-04 ENCOUNTER — Ambulatory Visit (INDEPENDENT_AMBULATORY_CARE_PROVIDER_SITE_OTHER): Payer: Medicare Other | Admitting: Pulmonary Disease

## 2020-10-04 VITALS — BP 120/70 | HR 71 | Wt 242.0 lb

## 2020-10-04 DIAGNOSIS — Z8616 Personal history of COVID-19: Secondary | ICD-10-CM | POA: Diagnosis not present

## 2020-10-04 DIAGNOSIS — Z86711 Personal history of pulmonary embolism: Secondary | ICD-10-CM

## 2020-10-04 DIAGNOSIS — I10 Essential (primary) hypertension: Secondary | ICD-10-CM

## 2020-10-04 DIAGNOSIS — I255 Ischemic cardiomyopathy: Secondary | ICD-10-CM

## 2020-10-04 DIAGNOSIS — U071 COVID-19: Secondary | ICD-10-CM

## 2020-10-04 DIAGNOSIS — I2510 Atherosclerotic heart disease of native coronary artery without angina pectoris: Secondary | ICD-10-CM | POA: Diagnosis not present

## 2020-10-04 DIAGNOSIS — E785 Hyperlipidemia, unspecified: Secondary | ICD-10-CM

## 2020-10-04 NOTE — Progress Notes (Signed)
Virtual Visit via Video Note   This visit type was conducted due to national recommendations for restrictions regarding the COVID-19 Pandemic (e.g. social distancing) in an effort to limit this patient's exposure and mitigate transmission in our community.  Due to his co-morbid illnesses, this patient is at least at moderate risk for complications without adequate follow up.  This format is felt to be most appropriate for this patient at this time.  All issues noted in this document were discussed and addressed.  A limited physical exam was performed with this format.  Please refer to the patient's chart for his consent to telehealth for [facility].      The patient was identified using 2 identifiers.  Patient Location: Home Provider Location: Office/Clinic  Date:  10/04/2020   ID:  Jesse Coleman, DOB Jun 12, 1956, MRN [id number]  PCP:  Reynold Bowen, MD  Cardiologist:  Buford Dresser, MD PhD  Referring MD: Reynold Bowen, MD   RP:[id number]  History of Present Illness:    Jesse Coleman is a 65 y.o. male with a hx of LBBB, CAD (2014), HTN, obesity who is seen for follow up. I met him during a hospitalization for anaphylaxis, for which he was discharged on 04/24/2019. Notes reviewed. He had demand ischemia during this event 2/2 anaphylaxis.  Cardiac history, obtained from the patient and Care Everywhere: -History of chest pain starting 06/2012, progressive. Symptoms were burning chest pain that spread to shoulders, arms, and back. Worse with exertion, better with rest. Associated with shortness of breath and diaphoresis -Echo stress test 01/2013 showed LBBB, EF 40-45% with global hypokinesis.  -Cath 01/2013 showed small caliber CAD in nondominant RCA, diagonal, and OM not amenable to PCI.  -treated with medical therapy (metoprolol, aspirin, atorvastatin, ranolazine). Declined imdur due to interaction with PDE5 inhibitors. Had SL NG, felt terrible when he took this. -amlodipine  added by Dr[facility] Baron Hamper at [facility], also recommended medical management. Amlodipine stopped due to LE edema. Metoprolol succinate uptitrated.   Today: Did home test on 1/2, positive for Covid. Felt poorly 1/1. Having whole body aches, nausea, low grade fevers. Felt like severe flu. Appetite is gone. Trying to stay hydrated. Breathing has been ok. Hasn't been vaccinated due to his history of anaphylaxis.  Before Covid, was doing well. Was doing treadmill, free weights without issues. Has been monitoring BP, O2 level several times/week. BP has been 120-130/70s-80s.   Denies chest pain, shortness of breath at rest or with normal exertion. No PND, orthopnea, LE edema or unexpected weight gain. No syncope or palpitations. No issues with significant bleeding on apixaban.   Past Medical History:  Diagnosis Date  . CAD (coronary artery disease)   . Erectile dysfunction   . Hematospermia   . Hyperlipidemia   . Hypertension 2014  . Overweight(278.02)   . 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 CARDIAC CATHETERIZATION  2014   LAD 40%, D1&D2 70-80% (1.12m), CFX 20%, OM1 & OM2 90% (1.O mm), OM3 50%, RCA 90% (1.5-1.75 mm), EF 40-45%, med rx  . [place]  [address] [place]  [place]  [place]    . REFRACTIVE SURGERY Right 2003  . VASECTOMY  2003    Current Medications: Current Outpatient Medications on File Prior to Visit  Medication Sig  . acetaminophen (TYLENOL) 650 MG  CR tablet Take 650 mg by mouth 2 (two) times daily as needed for pain.   . bromocriptine (PARLODEL) 2.5 MG tablet Take 1.25 mg by mouth at bedtime.   Marland Kitchen buPROPion (WELLBUTRIN XL) 150 MG 24 hr tablet Take 150 mg by mouth at bedtime.   Marland Kitchen ELIQUIS 5 MG TABS tablet TAKE 1 TABLET BY MOUTH TWO TIMES A DAY  . EPINEPHrine 0.3 mg/0.3 mL IJ SOAJ injection Inject 0.3 mLs  (0.3 mg total) into the muscle as needed for anaphylaxis.  . famotidine (PEPCID) 20 MG tablet Take 1 tablet (20 mg total) by mouth 2 (two) times daily. (Patient taking differently: Take 20 mg by mouth daily.)  . isosorbide dinitrate (ISORDIL) 30 MG tablet Take 30 mg by mouth 2 (two) times daily.   Marland Kitchen LORazepam (ATIVAN) 1 MG tablet Take 1 mg by mouth every 8 (eight) hours as needed for anxiety.   . metoprolol succinate (TOPROL-XL) 100 MG 24 hr tablet Take 100 mg by mouth daily. Take with or immediately following a meal.  . NITROSTAT 0.4 MG SL tablet Place 0.4 mg under the tongue every 5 (five) minutes as needed for chest pain.   . sildenafil (VIAGRA) 100 MG tablet TAKE 1 TABLET BY MOUTH 30CMINUTES PRIOR TO 1HR BEFORE RELATIONS.   No current facility-administered medications on file prior to visit.     Allergies:   Atorvastatin, Augmentin [amoxicillin-pot clavulanate], Latex, and Rosuvastatin   Social History   Tobacco Use  . Smoking status: Never Smoker  . Smokeless tobacco: Never Used  Substance Use Topics  . Alcohol use: Yes    Alcohol/week: 1.0 standard drink    Types: 1 drink(s) per week  . Drug use: No    Family History: The patient's family history includes Coronary artery disease (age of onset: 30) in his father; Emphysema in his mother.  ROS:   Please see the history of present illness.  Additional pertinent ROS otherwise negative.   EKGs/Labs/Other Studies Reviewed:    The following studies were reviewed today: Echo 06/10/19  1. The left ventricle has normal systolic function, with an ejection fraction of 55-60%. There is moderate concentric left ventricular hypertrophy. Left ventricular diastolic function could not be evaluated.  2. The right ventricle has moderately reduced systolic function. The cavity was moderately enlarged. Right ventricular systolic pressure is mildly elevated with an estimated pressure of 40.3 mmHg.  3. Left atrial size was moderately dilated.  4.  Right atrial size was moderately dilated.  5. Mild thickening of the mitral valve leaflet. No evidence of mitral valve stenosis.  6. Tricuspid valve regurgitation is moderate.  7. The aortic valve is tricuspid Moderate thickening of the aortic valve Moderate calcification of the aortic valve. Aortic valve regurgitation is trivial by color flow Doppler. No stenosis of the aortic valve.  EKG:  EKG is personally reviewed.  The ekg ordered 06/11/19 demonstrates NSR, iLBBB, first degree AV block  Recent Labs: 08/01/2020: ALT 10; BUN 18; Creatinine, Ser 1.19; Hemoglobin 15.1; Platelets 180; Potassium 4.4; Sodium 135  Recent Lipid Panel No results found for: CHOL, TRIG, HDL, CHOLHDL, VLDL, LDLCALC, LDLDIRECT  Physical Exam:    VS:  BP 120/70   Pulse 71   Wt 242 lb (109.8 kg)   SpO2 95%   BMI 34.72 kg/m     Wt Readings from Last 3 Encounters:  10/04/20 242 lb (109.8 kg)  08/08/20 246 lb 6.4 oz (111.8 kg)  09/21/19 238 lb (108 kg)    VITAL SIGNS:  reviewed GEN:  no acute distress EYES:  sclerae anicteric, EOMI - Extraocular Movements Intact RESPIRATORY:  normal respiratory effort, symmetric expansion CARDIOVASCULAR:  no visible JVD SKIN:  no rash, lesions or ulcers. MUSCULOSKELETAL:  no obvious deformities. NEURO:  alert and oriented x 3, no obvious focal deficit PSYCH:  normal affect  ASSESSMENT:    1. Personal history of COVID-19   2. Coronary artery disease involving native coronary artery of native heart without angina pectoris   3. Ischemic cardiomyopathy   4. Essential hypertension   5. Hyperlipidemia LDL goal <70   6. History of pulmonary embolism    PLAN:    Personal history of Covid -currently feeling ok -counseled on red flag signs that need immediate attention  CAD, with history of refractory angina: now without angina -no aspirin since plan for apixaban is indefinite -continue imdur, feels better on BID dosing -continue metoprolol succinate -amlodipine stopped  during his prior admission. Currently chest pain free, but consider restarting if angina recurs -on ranexa in the past, unclear why he stopped. No angina currently, no plans to restart at this time -counseled that NSAIDs are contraindicated in CAD -has sildenafil on med list. Counseled on risk of interaction with SL NG, imdur. Not currently taking.  Ischemic cardiomyopathy: now with recovered EF -continue metoprolol succinate -was on lisinopril in the past, held during prior admission for AKI. See below -NYHA class 1, euvolemic -recovering from Covid but encouraged continued exercise when he feels up to it  Hypertension: at goal today -continue current medications -if needed, consider adding back ACEi/ARB, will need to follow kidney function  Hyperlipidemia: goal LDL <70.  -most recent LDL 121 05/2019 -intolerant of atorvastatin and rosuvastatin in the past (myalgia) -we have discussed ezetimibe, nexletol, and PCSK9i in the past  History of pulmonary embolism: -on apixaban 5 mg BID -unprovoked. Hypercoagulable workup by Dr. Delton Coombes unrevealing.   Cardiac risk counseling and prevention recommendations: -recommend heart healthy/Mediterranean diet, with whole grains, fruits, vegetable, fish, lean meats, nuts, and olive oil. Limit salt. -recommend moderate walking, 3-5 times/week for 30-50 minutes each session. Aim for at least 150 minutes.week. Goal should be pace of 3 miles/hours, or walking 1.5 miles in 30 minutes -recommend avoidance of tobacco products. Avoid excess alcohol.  Plan for follow up: 1 year or sooner as needed  Today, I have spent 14 minutes with the patient with video/telehealth technology discussing the above problems.  Additional time spent in chart review, documentation, and communication.  Buford Dresser, MD, PhD   [facility]   Medication Adjustments/Labs and Tests Ordered: Current medicines are reviewed at length with the patient today.   Concerns regarding medicines are outlined above.  No orders of the defined types were placed in this encounter.  No orders of the defined types were placed in this encounter.   Patient Instructions  Medication Instructions:  Your Physician recommend you continue on your current medication as directed.    *If you need a refill on your cardiac medications before your next appointment, please call your pharmacy*   Lab Work: None  Testing/Procedures: None   Follow-Up: At [facility], you and your health needs are our priority.  As part of our continuing mission to provide you with exceptional heart care, we have created designated Provider Care Teams.  These Care Teams include your primary Cardiologist (physician) and Advanced Practice Providers (APPs -  Physician Assistants and Nurse Practitioners) who all work together to provide you with the care you need, when  you need it.  We recommend signing up for the patient portal called "MyChart".  Sign up information is provided on this After Visit Summary.  MyChart is used to connect with patients for Virtual Visits (Telemedicine).  Patients are able to view lab/test results, encounter notes, upcoming appointments, etc.  Non-urgent messages can be sent to your provider as well.   To learn more about what you can do with MyChart, go to [email or web address].    Your next appointment:   1 year(s)  The format for your next appointment:   In Person  Provider:   Buford Dresser, MD       Signed, Buford Dresser, MD PhD 10/04/2020  [place]

## 2020-10-04 NOTE — Progress Notes (Signed)
Jesse Coleman    [id number]    [birth year]  Primary Care Physician:South, Jeannett Senior, MD  Referring Physician: Adrian Prince, MD [address] [place],  [place] [address]  Virtual Visit via Telephone Note  I connected with Jesse Coleman on 10/04/20 at 11:15 AM EST by telephone and verified that I am speaking with the correct person using two identifiers.  Location: Patient: Home Provider: [facility], [address], [place], [place]   I discussed the limitations, risks, security and privacy concerns of performing an evaluation and management service by telephone and the availability of in person appointments. I also discussed with the patient that there may be a patient responsible charge related to this service. The patient expressed understanding and agreed to proceed.   Chief complaint: Follow-up for pulmonary embolism, COVID 19 infection  HPI: 65 year old with history of hypertension, coronary artery disease, hyperlipidemia.  Complains of dyspnea for the past 3 to 4 years.  Reports an aspiration event about 4 years ago before onset of symptoms.  Has occasional hoarseness of voice, wheezing, chest congestion.  Denies any cough  He was evaluated at [facility] a couple of years ago and was thought he had acid reflux.  He does have occasional heartburn symptoms but is well controlled on antiacid medication.  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  He was on epinephrine drip at that time.  Noted to have chest pain with elevated troponins and evaluated by cardiology for coronary artery disease which is treated medically.    Hospitalized in September 2020 for large pulmonary embolism.  This was treated with heparin anticoagulation and transitioned to Eliquis. PCCM consulted and it was felt that he did not need thrombolytics or EKOS anticoagulation He is seen Dr. Ellin Saba, hematology for evaluation of hypercoagulable work-up.  So far  the work-up has been negative.  Pets: Has a dog.  No birds, farm animals Occupation: Used to work at a [employer].  Currently works as a Veterinary surgeon Exposures: No known exposures.  No mold, hot tub, Jacuzzi Smoking history: Never smoker Travel history: Previously lived in [place].  Moved to [place] around 2010.  No significant recent travel Relevant family history: No significant family history of lung disease.  Interim history: Developed of COVID-19 symptoms on 1/1 and tested positive on the home test on 1/2.  Complains of body aches, low-grade fevers with loss of appetite. He is not vaccinated against Covid  Outpatient Encounter Medications as of 10/04/2020  Medication Sig  . acetaminophen (TYLENOL) 650 MG CR tablet Take 650 mg by mouth 2 (two) times daily as needed for pain.   Marland Kitchen buPROPion (WELLBUTRIN XL) 150 MG 24 hr tablet Take 150 mg by mouth at bedtime.   Marland Kitchen ELIQUIS 5 MG TABS tablet TAKE 1 TABLET BY MOUTH TWO TIMES A DAY  . EPINEPHrine 0.3 mg/0.3 mL IJ SOAJ injection Inject 0.3 mLs (0.3 mg total) into the muscle as needed for anaphylaxis.  . famotidine (PEPCID) 20 MG tablet Take 1 tablet (20 mg total) by mouth 2 (two) times daily. (Patient taking differently: Take 20 mg by mouth daily.)  . isosorbide dinitrate (ISORDIL) 30 MG tablet Take 30 mg by mouth 2 (two) times daily.   Marland Kitchen LORazepam (ATIVAN) 1 MG tablet Take 1 mg by mouth every 8 (eight) hours as needed for anxiety.   . metoprolol succinate (TOPROL-XL) 100 MG 24 hr tablet Take 100 mg by mouth daily. Take with  or immediately following a meal.  . NITROSTAT 0.4 MG SL tablet Place 0.4 mg under the tongue every 5 (five) minutes as needed for chest pain.   . sildenafil (VIAGRA) 100 MG tablet TAKE 1 TABLET BY MOUTH 30CMINUTES PRIOR TO 1HR BEFORE RELATIONS.  Marland Kitchen bromocriptine (PARLODEL) 2.5 MG tablet Take 1.25 mg by mouth at bedtime.   . cholecalciferol (VITAMIN D3) 25 MCG (1000 UT) tablet Take 1,000 Units by mouth at bedtime.   (Patient not taking: Reported on 10/04/2020)  . vitamin B-12 (CYANOCOBALAMIN) 1000 MCG tablet Take 1,000 mcg by mouth at bedtime.  (Patient not taking: Reported on 10/04/2020)   No facility-administered encounter medications on file as of 10/04/2020.   Physical Exam: Televisit  Data Reviewed: Imaging: CT chest 09/28/2013- no pulmonary abnormalities, coronary atherosclerosis Chest x-ray 04/22/2019- no active cardiopulmonary disease CTA 06/10/2019- pulmonary embolism with extensive bilateral thrombus.  Questionable thrombus in the IVC and right inferior pulmonary vein. I have reviewed the images personally  Lower extremity ultrasound 06/01/2019-no DVT  Labs: CBC 06/09/2019-WBC 5.5, eos 1.9%, absolute eosinophil count 104 CBC 06/10/2019-WBC 5.8, eos 1%, absolute eosinophil count 58 IgE 06/09/2019-204  SARS-CoV-2 04/23/2019-negative  Cardiac: Echocardiogram 06/10/2019- LVEF 55-60%.  Moderate concentric LVH.  RVSP 40  Assessment:  COVID-19 infection He is within the time window for monoclonal antibody or outpatient oral therapy. I will make referral to place him in the screening queue.  I have cautioned him that due to high volumes of referrals he may not be able to receive therapy and would receive a call back only if he gets selected.  In the meantime he will stay at home, self isolate and continue supportive care.  Pulmonary embolism Continue Eliquis for anticoagulation Would recommend indefinite anticoagulation as the PE appears to be unprovoked  Plan/Recommendations: - Referral for monoclonal antibody and outpatient Covid therapy - Continue Eliquis  I discussed the assessment and treatment plan with the patient. The patient was provided an opportunity to ask questions and all were answered. The patient agreed with the plan and demonstrated an understanding of the instructions.   The patient was advised to call back or seek an in-person evaluation if the symptoms worsen or if the  condition fails to improve as anticipated.  I provided 25 minutes of non-face-to-face time during this encounter.  Marshell Garfinkel MD [facility] 10/04/2020, 10:52 AM  CC: Reynold Bowen, MD

## 2020-10-04 NOTE — Patient Instructions (Signed)

## 2020-10-09 ENCOUNTER — Emergency Department (HOSPITAL_COMMUNITY): Payer: Medicare Other

## 2020-10-09 ENCOUNTER — Emergency Department (HOSPITAL_COMMUNITY)
Admission: EM | Admit: 2020-10-09 | Discharge: 2020-10-09 | Disposition: A | Discharge status: Home / Self Care | Payer: Medicare Other | Attending: Emergency Medicine | Admitting: Emergency Medicine

## 2020-10-09 ENCOUNTER — Other Ambulatory Visit: Payer: Self-pay

## 2020-10-09 ENCOUNTER — Encounter: Payer: Self-pay | Admitting: Cardiology

## 2020-10-09 DIAGNOSIS — Z959 Presence of cardiac and vascular implant and graft, unspecified: Secondary | ICD-10-CM | POA: Insufficient documentation

## 2020-10-09 DIAGNOSIS — N179 Acute kidney failure, unspecified: Secondary | ICD-10-CM | POA: Insufficient documentation

## 2020-10-09 DIAGNOSIS — Z79899 Other long term (current) drug therapy: Secondary | ICD-10-CM | POA: Insufficient documentation

## 2020-10-09 DIAGNOSIS — I509 Heart failure, unspecified: Secondary | ICD-10-CM | POA: Insufficient documentation

## 2020-10-09 DIAGNOSIS — R0602 Shortness of breath: Secondary | ICD-10-CM | POA: Diagnosis not present

## 2020-10-09 DIAGNOSIS — I2510 Atherosclerotic heart disease of native coronary artery without angina pectoris: Secondary | ICD-10-CM | POA: Diagnosis not present

## 2020-10-09 DIAGNOSIS — I110 Hypertensive heart disease with heart failure: Secondary | ICD-10-CM | POA: Diagnosis not present

## 2020-10-09 NOTE — Discharge Instructions (Addendum)
Your work-up today was overall reassuring.  Please call the post-COVID care center at [facility] for further follow-up.  Return to the ER for any new or worsening symptoms.

## 2020-10-09 NOTE — ED Notes (Signed)
Pt tolerated ambulation well.  O2 sat was at 95% when I entered the room.  Pt talked and did ~10 laps around the bed, O2 sats maintained between 94-97% At rest pt maintained o2 sat of 97% Pt denied feeling lightheaded, dizzy, sob during and after ambulation.  EDP notified of these findings.

## 2020-10-09 NOTE — Telephone Encounter (Signed)
Was seen via telemedicine visit with Dr. Vaughan Browner .

## 2020-10-09 NOTE — ED Provider Notes (Signed)
[place] EMERGENCY DEPARTMENT Provider Note   CSN: [id number] Arrival date & time: 10/09/20  [id number]     History No chief complaint on file.   Jesse Coleman is a 65 y.o. male.  HPI 65 year old male with a history of CAD, hyperlipidemia, hypertension to the ER with complaints of shortness of breath in the setting of COVID-19.  Patient states he tested positive with a rapid at home test on 10/01/2020.  He is not vaccinated.  Has had ongoing shortness of breath with exertion since his diagnosis.  States that he had a home pulse ox monitor and his wife noted that his oxygen saturations dropped to 88% while ambulating, and thus she urged him to come to the ER.  He has no shortness of breath at rest.  No noticeable leg swelling.  No chest pain.  No pleuritic symptoms.  Denies any abdominal pain, nausea, vomiting, diarrhea.    Past Medical History:  Diagnosis Date  . CAD (coronary artery disease)   . Erectile dysfunction   . Hematospermia   . Hyperlipidemia   . Hypertension 2014  . Overweight(278.02)   . 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 History of pulmonary embolism 09/23/2019  . Pulmonary embolism ([place]) 06/10/2019  . Coronary artery disease involving native coronary artery of native heart without angina pectoris 05/21/2019  . Ischemic cardiomyopathy 05/21/2019  . 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 05/21/2019  . Anaphylactic shock 04/23/2019  . AKI (acute kidney injury) ([place])   . Hyperkalemia   . Hyperlipidemia LDL goal <70 06/03/2018  . Chest pain   . Essential hypertension   . Prolactin-secreting pituitary adenoma [facility])     Past Surgical History:  Procedure Laterality Date  . CARDIAC CATHETERIZATION  2014   LAD 40%, D1&D2 70-80% (1.60mm), CFX 20%, OM1 & OM2 90% (1.O mm), OM3 50%, RCA 90% (1.5-1.75 mm), EF 40-45%, med rx  . COLONOSCOPY  [address]  [place]  . Dental implant  2007  . [place]   [place], [place]  . KNEE SURGERY    . REFRACTIVE SURGERY Right 2003  . VASECTOMY  2003       Family History  Problem Relation Age of Onset  . Coronary artery disease Father 35       sudden cardiac death  . Emphysema Mother        smoked    Social History   Tobacco Use  . Smoking status: Never Smoker  . Smokeless tobacco: Never Used  Substance Use Topics  . Alcohol use: Yes    Alcohol/week: 1.0 standard drink    Types: 1 drink(s) per week  . Drug use: No    Home Medications Prior to Admission medications   Medication Sig Start Date End Date Taking? Authorizing Provider  acetaminophen (TYLENOL) 650 MG CR tablet Take 650 mg by mouth 2 (two) times daily as needed for pain.     [provider]  bromocriptine (PARLODEL) 2.5 MG tablet Take 1.25 mg by mouth at bedtime.  01/19/13   [provider]  buPROPion (WELLBUTRIN XL) 150 MG 24 hr tablet Take 150 mg by mouth at bedtime.     [provider]  ELIQUIS 5 MG TABS tablet TAKE 1 TABLET BY MOUTH TWO TIMES A DAY 11/15/19   Mannam, Praveen, MD  EPINEPHrine 0.3 mg/0.3 mL IJ SOAJ injection Inject 0.3 mLs (  0.3 mg total) into the muscle as needed for anaphylaxis. 04/24/19   Doreatha Lew, MD  famotidine (PEPCID) 20 MG tablet Take 1 tablet (20 mg total) by mouth 2 (two) times daily. Patient taking differently: Take 20 mg by mouth daily. 04/24/19   Doreatha Lew, MD  isosorbide mononitrate (IMDUR) 30 MG 24 hr tablet Take 30 mg by mouth in the morning and at bedtime.    [provider]  LORazepam (ATIVAN) 1 MG tablet Take 1 mg by mouth every 8 (eight) hours as needed for anxiety.  01/19/13   [provider]  metoprolol succinate (TOPROL-XL) 100 MG 24 hr tablet Take 100 mg by mouth daily. Take with or immediately following a meal.    [provider]  NITROSTAT 0.4 MG SL tablet Place 0.4 mg under the tongue every 5 (five)  minutes as needed for chest pain.  01/19/13   [provider]  sildenafil (VIAGRA) 100 MG tablet TAKE 1 TABLET BY MOUTH 30CMINUTES PRIOR TO 1HR BEFORE RELATIONS. 04/20/19   [provider]    Allergies    Atorvastatin, Augmentin [amoxicillin-pot clavulanate], Latex, and Rosuvastatin  Review of Systems   Review of Systems  Constitutional: Positive for activity change, appetite change and fatigue. Negative for chills and fever.  Respiratory: Positive for cough and shortness of breath.   Cardiovascular: Negative for chest pain.  Gastrointestinal: Positive for vomiting. Negative for abdominal pain, diarrhea and nausea.  Genitourinary: Negative for dysuria.  Neurological: Positive for weakness.    Physical Exam Updated Vital Signs BP (!) 152/95   Pulse 62   Temp 98.2 F (36.8 C) (Oral)   Resp (!) 23   Ht 5\' 10"  (1.778 m)   Wt 109.8 kg   SpO2 98%   BMI 34.72 kg/m   Physical Exam Vitals and nursing note reviewed.  Constitutional:      Appearance: He is well-developed and well-nourished.     Comments: Well-appearing  HENT:     Head: Normocephalic and atraumatic.  Eyes:     Conjunctiva/sclera: Conjunctivae normal.  Cardiovascular:     Rate and Rhythm: Normal rate and regular rhythm.     Heart sounds: No murmur heard.   Pulmonary:     Effort: Pulmonary effort is normal. No respiratory distress.     Breath sounds: Normal breath sounds.     Comments: Lung sounds clear to auscultation bilaterally, speaking full sentences without increased work of breathing, no wheezes Abdominal:     Palpations: Abdomen is soft.     Tenderness: There is no abdominal tenderness.  Musculoskeletal:        General: No edema. Normal range of motion.     Cervical back: Neck supple.     Right lower leg: No edema.     Left lower leg: No edema.  Skin:    General: Skin is warm and dry.     Capillary Refill: Capillary refill takes less than 2 seconds.  Neurological:     General: No  focal deficit present.     Mental Status: He is alert and oriented to person, place, and time.  Psychiatric:        Mood and Affect: Mood and affect and mood normal.        Behavior: Behavior normal.     ED Results / Procedures / Treatments   Labs (all labs ordered are listed, but only abnormal results are displayed) Labs Reviewed - No data to display  EKG None  Radiology  DG Chest Portable 1 View  Result Date: 10/09/2020 CLINICAL DATA:  Shortness of breath, positive COVID test at home. EXAM: PORTABLE CHEST 1 VIEW COMPARISON:  CT chest 06/10/2019 and chest radiograph 06/23/2019. FINDINGS: Trachea is midline. Heart size stable. Lungs are clear. No pleural fluid. IMPRESSION: No acute findings. Electronically Signed   By: Lorin Picket M.D.   On: 10/09/2020 13:16    Procedures Procedures (including critical care time)  Medications Ordered in ED Medications - No data to display  ED Course  I have reviewed the triage vital signs and the nursing notes.  Pertinent labs & imaging results that were available during my care of the patient were reviewed by me and considered in my medical decision making (see chart for details).    MDM Rules/Calculators/A&P                          65 year old male presents to the ER with shortness of breath in the setting of COVID-19.  Overall well-appearing, vitals reassuring.  No evidence of tachycardia, hypoxia here.  Physical exam largely unremarkable.  Chest x-ray without any acute findings.  The patient is denying any chest pain or pleuritic symptoms.  Low suspicion for PE, ACS, HF exacerbation.  Plan to ambulate, and discharge if this is normal.  Will refer to post COVID care clinic at [facility].   Ambulated by nursing staff without evidence of hypoxia. Stable for discharge w/ [place] followup.  All the patient's questions have been answered to his satisfaction, he voices understanding and is agreeable.     Final Clinical Impression(s) / ED  Diagnoses Final diagnoses:  Shortness of breath    Rx / DC Orders ED Discharge Orders    None       Garald Balding, PA-C 10/09/20 1418    Gareth Morgan, MD 10/10/20 1541

## 2020-10-09 NOTE — ED Triage Notes (Signed)
Pt here covid positive from a at home  Kit , states that he is sob stas are 96 % on room air , wife also has covid

## 2020-11-16 ENCOUNTER — Other Ambulatory Visit: Payer: Self-pay | Admitting: Pulmonary Disease

## 2021-04-05 ENCOUNTER — Telehealth: Payer: Self-pay | Admitting: Pulmonary Disease

## 2021-04-05 NOTE — Telephone Encounter (Signed)
Call returned to patient, confirmed DOB> Patient states the elqiuis went from $47 to $147 per month. Patient states he is unable to afford this and wanted to know if there was some type of coupon or patient assistance. I made him aware Eliquis is not something that is prescribed often in this clinic so we do not get a lot of coupons however I was able to find a patient assistance application on line that may help. Patient requested this be mailed to him. Address confirmed. Application printed and placed upfront to be mailed. Patient also encouraged to call his insurance and see if there are any cheaper alternatives for him. Voiced understanding.   Nothing further needed at this time.

## 2021-08-08 ENCOUNTER — Inpatient Hospital Stay (HOSPITAL_COMMUNITY): Payer: Medicare Other | Attending: Hematology

## 2021-08-08 ENCOUNTER — Other Ambulatory Visit: Payer: Self-pay

## 2021-08-08 DIAGNOSIS — Z7901 Long term (current) use of anticoagulants: Secondary | ICD-10-CM | POA: Diagnosis not present

## 2021-08-08 DIAGNOSIS — Z86718 Personal history of other venous thrombosis and embolism: Secondary | ICD-10-CM | POA: Diagnosis present

## 2021-08-08 DIAGNOSIS — Z86018 Personal history of other benign neoplasm: Secondary | ICD-10-CM | POA: Diagnosis not present

## 2021-08-08 DIAGNOSIS — N529 Male erectile dysfunction, unspecified: Secondary | ICD-10-CM | POA: Diagnosis not present

## 2021-08-08 DIAGNOSIS — Z86711 Personal history of pulmonary embolism: Secondary | ICD-10-CM | POA: Diagnosis present

## 2021-08-08 DIAGNOSIS — E785 Hyperlipidemia, unspecified: Secondary | ICD-10-CM | POA: Insufficient documentation

## 2021-08-08 DIAGNOSIS — I2510 Atherosclerotic heart disease of native coronary artery without angina pectoris: Secondary | ICD-10-CM | POA: Diagnosis not present

## 2021-08-08 DIAGNOSIS — I10 Essential (primary) hypertension: Secondary | ICD-10-CM | POA: Insufficient documentation

## 2021-08-08 DIAGNOSIS — I2699 Other pulmonary embolism without acute cor pulmonale: Secondary | ICD-10-CM

## 2021-08-08 LAB — CBC WITH DIFFERENTIAL/PLATELET
Abs Immature Granulocytes: 0.02 10*3/uL (ref 0.00–0.07)
Basophils Absolute: 0 10*3/uL (ref 0.0–0.1)
Basophils Relative: 0 %
Eosinophils Absolute: 0.2 10*3/uL (ref 0.0–0.5)
Eosinophils Relative: 3 %
HCT: 44 % (ref 39.0–52.0)
Hemoglobin: 14.9 g/dL (ref 13.0–17.0)
Immature Granulocytes: 0 %
Lymphocytes Relative: 28 %
Lymphs Abs: 1.7 10*3/uL (ref 0.7–4.0)
MCH: 32.7 pg (ref 26.0–34.0)
MCHC: 33.9 g/dL (ref 30.0–36.0)
MCV: 96.5 fL (ref 80.0–100.0)
Monocytes Absolute: 0.7 10*3/uL (ref 0.1–1.0)
Monocytes Relative: 12 %
Neutro Abs: 3.5 10*3/uL (ref 1.7–7.7)
Neutrophils Relative %: 57 %
Platelets: 177 10*3/uL (ref 150–400)
RBC: 4.56 MIL/uL (ref 4.22–5.81)
RDW: 12.8 % (ref 11.5–15.5)
WBC: 6.1 10*3/uL (ref 4.0–10.5)
nRBC: 0 % (ref 0.0–0.2)

## 2021-08-08 LAB — COMPREHENSIVE METABOLIC PANEL
ALT: 11 U/L (ref 0–44)
AST: 20 U/L (ref 15–41)
Albumin: 4.1 g/dL (ref 3.5–5.0)
Alkaline Phosphatase: 68 U/L (ref 38–126)
Anion gap: 7 (ref 5–15)
BUN: 17 mg/dL (ref 8–23)
CO2: 26 mmol/L (ref 22–32)
Calcium: 9.2 mg/dL (ref 8.9–10.3)
Chloride: 103 mmol/L (ref 98–111)
Creatinine, Ser: 1.15 mg/dL (ref 0.61–1.24)
GFR, Estimated: >60 mL/min (ref >60)
Glucose, Bld: 93 mg/dL (ref 70–99)
Potassium: 4.4 mmol/L (ref 3.5–5.1)
Sodium: 136 mmol/L (ref 135–145)
Total Bilirubin: 0.8 mg/dL (ref 0.3–1.2)
Total Protein: 6.8 g/dL (ref 6.5–8.1)

## 2021-08-08 LAB — D-DIMER, QUANTITATIVE: D-Dimer, Quant: 0.37 ug/mL-FEU (ref 0.00–0.50)

## 2021-08-08 LAB — LACTATE DEHYDROGENASE: LDH: 112 U/L (ref 98–192)

## 2021-08-13 NOTE — Progress Notes (Signed)
[place], [place] [address]   CLINIC:  Medical Oncology/Hematology  PCP:  Jesse Coleman, [place] / [place] [address]  [phone]  REASON FOR VISIT:  Follow-up for unprovoked pulmonary embolism  PRIOR THERAPY: None  CURRENT THERAPY: Eliquis daily  INTERVAL HISTORY:  [name], a 65 y.o. male, returns for routine follow-up for his unprovoked pulmonary embolism. Jesse Coleman was last seen on 08/08/2020.  Today he reports feeling good. He denies any current bleeding, but he reports easy bruising. He also denies CP and leg swellings.   REVIEW OF SYSTEMS:  Review of Systems  Constitutional:  Negative for appetite change and fatigue (60%).  HENT:   Negative for nosebleeds.   Respiratory:  Positive for cough (dry). Negative for hemoptysis.   Cardiovascular:  Negative for chest pain and leg swelling.  Gastrointestinal:  Negative for blood in stool.  Genitourinary:  Positive for difficulty urinating (slow stream).   Hematological:  Bruises/bleeds easily.  Psychiatric/Behavioral:  Positive for sleep disturbance.   All other systems reviewed and are negative.  PAST MEDICAL/SURGICAL HISTORY:  Past Medical History:  Diagnosis Date   CAD (coronary artery disease)    Erectile dysfunction    Hematospermia    Hyperlipidemia    Hypertension 2014   Overweight(278.02)    Prolactin-secreting pituitary adenoma [facility]) 1999   Medical therapy-1999   Past Surgical History:  Procedure Laterality Date   CARDIAC CATHETERIZATION  2014   LAD 40%, D1&D2 70-80% (1.65mm), CFX 20%, OM1 & OM2 90% (1.O mm), OM3 50%, RCA 90% (1.5-1.75 mm), EF 40-45%, med rx   COLONOSCOPY  2008   [place], [place] implant  2007   HERNIA REPAIR  2010   [place], [place]     REFRACTIVE SURGERY Right 2003   VASECTOMY  2003    SOCIAL HISTORY:  Social History   Socioeconomic History   Marital status: Married    Spouse  name: Not on file   Number of children: 2   Years of education: Not on file   Highest education level: Not on file  Occupational History   Occupation: Real estate Scientist, clinical (histocompatibility and immunogenetics): [employer]   Tobacco Use   Smoking status: Never   Smokeless tobacco: Never  Substance and Sexual Activity   Alcohol use: Yes    Alcohol/week: 1.0 standard drink    Types: 1 drink(s) per week   Drug use: No   Sexual activity: Not on file  Other Topics Concern   Not on file  Social History Narrative   Not on file   Social Determinants of Health   Financial Resource Strain: Not on file  Food Insecurity: Not on file  Transportation Needs: Not on file  Physical Activity: Not on file  Stress: Not on file  Social Connections: Not on file  Intimate Partner Violence: Not on file    FAMILY HISTORY:  Family History  Problem Relation Age of Onset   Coronary artery disease Father 87       sudden cardiac death   Emphysema Mother        smoked    CURRENT MEDICATIONS:  Current Outpatient Medications  Medication Sig Dispense Refill   bromocriptine (PARLODEL) 2.5 MG tablet Take 1.25 mg by mouth at bedtime.      buPROPion (WELLBUTRIN XL) 150 MG 24 hr tablet Take 150 mg by mouth at bedtime.  ELIQUIS 5 MG TABS tablet TAKE 1 TABLET BY MOUTH TWO TIMES A DAY 60 tablet 12   famotidine (PEPCID) 20 MG tablet Take 1 tablet (20 mg total) by mouth 2 (two) times daily. (Patient taking differently: Take 20 mg by mouth daily.) 60 tablet 0   isosorbide mononitrate (IMDUR) 30 MG 24 hr tablet Take 30 mg by mouth in the morning and at bedtime.     metoprolol succinate (TOPROL-XL) 100 MG 24 hr tablet Take 100 mg by mouth daily. Take with or immediately following a meal.     sildenafil (VIAGRA) 100 MG tablet TAKE 1 TABLET BY MOUTH 30CMINUTES PRIOR TO 1HR BEFORE RELATIONS.     acetaminophen (TYLENOL) 650 MG CR tablet Take 650 mg by mouth 2 (two) times daily as needed for pain.  (Patient not taking: Reported on  08/14/2021)     EPINEPHrine 0.3 mg/0.3 mL IJ SOAJ injection Inject 0.3 mLs (0.3 mg total) into the muscle as needed for anaphylaxis. (Patient not taking: Reported on 08/14/2021) 1 each 0   LORazepam (ATIVAN) 1 MG tablet Take 1 mg by mouth every 8 (eight) hours as needed for anxiety.  (Patient not taking: Reported on 08/14/2021)     NITROSTAT 0.4 MG SL tablet Place 0.4 mg under the tongue every 5 (five) minutes as needed for chest pain.  (Patient not taking: Reported on 08/14/2021)     No current facility-administered medications for this visit.    ALLERGIES:  Allergies  Allergen Reactions   Atorvastatin Other (See Comments)   Augmentin [Amoxicillin-Pot Clavulanate] Nausea And Vomiting   Latex Swelling and Other (See Comments)    Lips swell, but no breathing issues   Rosuvastatin Other (See Comments)    PHYSICAL EXAM:  Performance status (ECOG): 0 - Asymptomatic  Vitals:   08/14/21 1118  BP: (!) 150/75  Pulse: 63  Resp: 18  Temp: 97.8 F (36.6 C)  SpO2: 97%   Wt Readings from Last 3 Encounters:  08/14/21 243 lb 1.6 oz (110.3 kg)  10/09/20 242 lb (109.8 kg)  10/04/20 242 lb (109.8 kg)   Physical Exam Vitals reviewed.  Constitutional:      Appearance: Normal appearance. He is obese.  Cardiovascular:     Rate and Rhythm: Normal rate and regular rhythm.     Pulses: Normal pulses.     Heart sounds: Normal heart sounds.  Pulmonary:     Effort: Pulmonary effort is normal.     Breath sounds: Normal breath sounds.  Abdominal:     Palpations: Abdomen is soft. There is no hepatomegaly, splenomegaly or mass.     Tenderness: There is no abdominal tenderness.  Musculoskeletal:     Right lower leg: No edema.     Left lower leg: No edema.  Neurological:     General: No focal deficit present.     Mental Status: He is alert and oriented to person, place, and time.  Psychiatric:        Mood and Affect: Mood normal.        Behavior: Behavior normal.    LABORATORY DATA:  I have  reviewed the labs as listed.  CBC Latest Ref Rng & Units 08/08/2021 08/01/2020 06/13/2019  WBC 4.0 - 10.5 K/uL 6.1 5.9 5.5  Hemoglobin 13.0 - 17.0 g/dL 14.9 15.1 13.9  Hematocrit 39.0 - 52.0 % 44.0 47.3 42.6  Platelets 150 - 400 K/uL 177 180 154   CMP Latest Ref Rng & Units 08/08/2021 08/01/2020 06/12/2019  Glucose 70 -  99 mg/dL 93 100(H) 85  BUN 8 - 23 mg/dL 17 18 12   Creatinine 0.61 - 1.24 mg/dL 1.15 1.19 1.45(H)  Sodium 135 - 145 mmol/L 136 135 138  Potassium 3.5 - 5.1 mmol/L 4.4 4.4 4.3  Chloride 98 - 111 mmol/L 103 104 108  CO2 22 - 32 mmol/L 26 25 23   Calcium 8.9 - 10.3 mg/dL 9.2 9.5 9.4  Total Protein 6.5 - 8.1 g/dL 6.8 6.4(L) -  Total Bilirubin 0.3 - 1.2 mg/dL 0.8 0.8 -  Alkaline Phos 38 - 126 U/L 68 51 -  AST 15 - 41 U/L 20 19 -  ALT 0 - 44 U/L 11 10 -      Component Value Date/Time   RBC 4.56 08/08/2021 0853   MCV 96.5 08/08/2021 0853   MCH 32.7 08/08/2021 0853   MCHC 33.9 08/08/2021 0853   RDW 12.8 08/08/2021 0853   LYMPHSABS 1.7 08/08/2021 0853   MONOABS 0.7 08/08/2021 0853   EOSABS 0.2 08/08/2021 0853   BASOSABS 0.0 08/08/2021 0853    DIAGNOSTIC IMAGING:  I have independently reviewed the scans and discussed with the patient. No results found.   ASSESSMENT:  1.  Unprovoked pulmonary embolism: -Referred by Dr. Vaughan Browner for unprovoked pulmonary embolism. -CT angiogram on 06/10/2019-acute bilateral extensive pulmonary thrombus, no saddle thrombus, suspicion for right heart strain.  No pulmonary infarct/effusion.  Questionable thrombus in the IVC and right inferior pulmonary vein. -2D echo 10/18/2018 with EF 55-60%.  Right ventricle moderately reduced systolic function.  Cavity moderately enlarged. -Hypercoagulable work-up including factor V Leiden, prothrombin gene mutation, Antithrombin III, lupus anticoagulant, anticardiolipin antibody and beta-2 glycoprotein 1 antibody were negative. -Colonoscopy in 2007 in [place] was within normal limits.  He is a  non-smoker. -Anaphylactic reaction in July 2020 lasting for 4 days with swelling of his abdomen and lower extremities. -JAK2 V617F testing was negative. -His estranged son also has DVT and some abnormality with protein S.   PLAN:  1.  Unprovoked pulmonary embolism: - He is continuing to tolerate Eliquis without any major problems. - Reviewed labs from 08/08/2021.  LFTs and creatinine normal.  CBC was normal.  D-dimer was 0.37. - He does not report any bleeding issues. - RTC 1 year for follow-up to evaluate for risk-benefit ratio.  Orders placed this encounter:  Orders Placed This Encounter  Procedures   D-dimer, quantitative     Derek Jack, MD [place] [phone]   I, Thana Ates, am acting as a scribe for Dr. Derek Jack.  I, Derek Jack MD, have reviewed the above documentation for accuracy and completeness, and I agree with the above.

## 2021-08-14 ENCOUNTER — Other Ambulatory Visit: Payer: Self-pay

## 2021-08-14 ENCOUNTER — Inpatient Hospital Stay (HOSPITAL_COMMUNITY): Payer: Medicare Other | Admitting: Hematology

## 2021-08-14 VITALS — BP 150/75 | HR 63 | Temp 97.8°F | Resp 18 | Wt 243.1 lb

## 2021-08-14 DIAGNOSIS — Z86711 Personal history of pulmonary embolism: Secondary | ICD-10-CM | POA: Diagnosis not present

## 2021-08-14 DIAGNOSIS — I2699 Other pulmonary embolism without acute cor pulmonale: Secondary | ICD-10-CM | POA: Diagnosis not present

## 2021-08-14 NOTE — Patient Instructions (Addendum)
[place] at [facility] Discharge Instructions  You were seen and examined today by Dr. Delton Coombes. He reviewed the results of your lab work - your D-dimer was normal. Continue Eliquis as prescribed.  Return as scheduled in 1 year with lab work and office visit.    Thank you for choosing [facility] at [facility] to provide your oncology and hematology care.  To afford each patient quality time with our provider, please arrive at least 15 minutes before your scheduled appointment time.   If you have a lab appointment with the [facility] please come in thru the Main Entrance and check in at the main information desk.  You need to re-schedule your appointment should you arrive 10 or more minutes late.  We strive to give you quality time with our providers, and arriving late affects you and other patients whose appointments are after yours.  Also, if you no show three or more times for appointments you may be dismissed from the clinic at the providers discretion.     Again, thank you for choosing [facility].  Our hope is that these requests will decrease the amount of time that you wait before being seen by our physicians.       _____________________________________________________________  Should you have questions after your visit to [facility], please contact our office at [phone] and follow the prompts.  Our office hours are 8:00 a.m. and 4:30 p.m. Monday - Friday.  Please note that voicemails left after 4:00 p.m. may not be returned until the following business day.  We are closed weekends and major holidays.  You do have access to a nurse 24-7, just call the main number to the clinic [phone] and do not press any options, hold on the line and a nurse will answer the phone.    For prescription refill requests, have your pharmacy contact our office and allow 72 hours.    Due to Covid, you will need to wear  a mask upon entering the hospital. If you do not have a mask, a mask will be given to you at the Main Entrance upon arrival. For doctor visits, patients may have 1 support person age 5 or older with them. For treatment visits, patients can not have anyone with them due to social distancing guidelines and our immunocompromised population.

## 2021-08-15 ENCOUNTER — Ambulatory Visit (HOSPITAL_COMMUNITY): Payer: Medicare Other | Admitting: Hematology

## 2021-08-28 ENCOUNTER — Telehealth (HOSPITAL_COMMUNITY): Payer: Self-pay | Admitting: Hematology

## 2021-12-03 ENCOUNTER — Other Ambulatory Visit: Payer: Self-pay | Admitting: Pulmonary Disease

## 2022-01-01 ENCOUNTER — Other Ambulatory Visit: Payer: Self-pay | Admitting: Pulmonary Disease

## 2022-01-31 ENCOUNTER — Other Ambulatory Visit: Payer: Self-pay | Admitting: Pulmonary Disease

## 2022-03-05 ENCOUNTER — Other Ambulatory Visit: Payer: Self-pay | Admitting: Pulmonary Disease

## 2022-03-29 ENCOUNTER — Other Ambulatory Visit: Payer: Self-pay | Admitting: Pulmonary Disease

## 2022-05-15 ENCOUNTER — Ambulatory Visit (HOSPITAL_BASED_OUTPATIENT_CLINIC_OR_DEPARTMENT_OTHER): Payer: Medicare Other | Admitting: Cardiology

## 2022-05-28 ENCOUNTER — Other Ambulatory Visit: Payer: Self-pay

## 2022-05-28 ENCOUNTER — Encounter (HOSPITAL_COMMUNITY): Payer: Self-pay | Admitting: Emergency Medicine

## 2022-05-28 ENCOUNTER — Emergency Department (HOSPITAL_COMMUNITY): Payer: Medicare Other

## 2022-05-28 ENCOUNTER — Emergency Department (HOSPITAL_COMMUNITY)
Admission: EM | Admit: 2022-05-28 | Discharge: 2022-05-28 | Disposition: A | Discharge status: Home / Self Care | Payer: Medicare Other | Attending: Emergency Medicine | Admitting: Emergency Medicine

## 2022-05-28 DIAGNOSIS — Z7901 Long term (current) use of anticoagulants: Secondary | ICD-10-CM | POA: Insufficient documentation

## 2022-05-28 DIAGNOSIS — Z91040 Latex allergy status: Secondary | ICD-10-CM | POA: Diagnosis not present

## 2022-05-28 DIAGNOSIS — Z79899 Other long term (current) drug therapy: Secondary | ICD-10-CM | POA: Insufficient documentation

## 2022-05-28 DIAGNOSIS — S60011A Contusion of right thumb without damage to nail, initial encounter: Secondary | ICD-10-CM | POA: Diagnosis not present

## 2022-05-28 DIAGNOSIS — W19XXXA Unspecified fall, initial encounter: Secondary | ICD-10-CM | POA: Diagnosis not present

## 2022-05-28 DIAGNOSIS — S50811A Abrasion of right forearm, initial encounter: Secondary | ICD-10-CM | POA: Diagnosis not present

## 2022-05-28 DIAGNOSIS — S5001XA Contusion of right elbow, initial encounter: Secondary | ICD-10-CM | POA: Diagnosis not present

## 2022-05-28 DIAGNOSIS — S59901A Unspecified injury of right elbow, initial encounter: Secondary | ICD-10-CM | POA: Diagnosis present

## 2022-05-28 DIAGNOSIS — T07XXXA Unspecified multiple injuries, initial encounter: Secondary | ICD-10-CM

## 2022-05-28 NOTE — ED Notes (Signed)
Wound cleaned and dressed. Care instructions given

## 2022-05-28 NOTE — ED Provider Notes (Signed)
[facility] EMERGENCY DEPARTMENT Provider Note   CSN: [id number] Arrival date & time: 05/28/22  5638     History  Chief Complaint  Patient presents with   Fall   Abrasion    Jesse Coleman is a 66 y.o. male.  HPI 66 year old male presents with fall.  He was walking the dogs and got tangled in the leashes as they pulled him in a direction.  He fell and primarily landed on his right arm.  He is on Eliquis but denies hitting his head or having a headache.  He has a small scrape to his right knee but states that is not hurting.  Primarily he is hurting in his right elbow as well as having a swollen right proximal thumb and some mild right shoulder pain.  He thinks his Tdap is up-to-date within the last 5-10 years.  Home Medications Prior to Admission medications   Medication Sig Start Date End Date Taking? Authorizing Provider  acetaminophen (TYLENOL) 650 MG CR tablet Take 650 mg by mouth 2 (two) times daily as needed for pain.  Patient not taking: Reported on 08/14/2021    [provider]  bromocriptine (PARLODEL) 2.5 MG tablet Take 1.25 mg by mouth at bedtime.  01/19/13   [provider]  buPROPion (WELLBUTRIN XL) 150 MG 24 hr tablet Take 150 mg by mouth at bedtime.     [provider]  ELIQUIS 5 MG TABS tablet TAKE 1 TABLET BY MOUTH TWICE A DAY. 01/31/22   Mannam, Praveen, MD  EPINEPHrine 0.3 mg/0.3 mL IJ SOAJ injection Inject 0.3 mLs (0.3 mg total) into the muscle as needed for anaphylaxis. Patient not taking: Reported on 08/14/2021 04/24/19   Patrecia Pour, Christean Grief, MD  famotidine (PEPCID) 20 MG tablet Take 1 tablet (20 mg total) by mouth 2 (two) times daily. Patient taking differently: Take 20 mg by mouth daily. 04/24/19   Doreatha Lew, MD  isosorbide mononitrate (IMDUR) 30 MG 24 hr tablet Take 30 mg by mouth in the morning and at bedtime.    [provider]  LORazepam (ATIVAN) 1 MG tablet Take 1 mg by mouth every 8 (eight) hours as needed for  anxiety.  Patient not taking: Reported on 08/14/2021 01/19/13   [provider]  metoprolol succinate (TOPROL-XL) 100 MG 24 hr tablet Take 100 mg by mouth daily. Take with or immediately following a meal.    [provider]  NITROSTAT 0.4 MG SL tablet Place 0.4 mg under the tongue every 5 (five) minutes as needed for chest pain.  Patient not taking: Reported on 08/14/2021 01/19/13   [provider]  sildenafil (VIAGRA) 100 MG tablet TAKE 1 TABLET BY MOUTH 30CMINUTES PRIOR TO 1HR BEFORE RELATIONS. 04/20/19   [provider]      Allergies    Atorvastatin, Augmentin [amoxicillin-pot clavulanate], Latex, and Rosuvastatin    Review of Systems   Review of Systems  Musculoskeletal:  Positive for arthralgias and joint swelling.  Neurological:  Negative for weakness, numbness and headaches.    Physical Exam Updated Vital Signs BP (!) 145/86 (BP Location: Left Arm)   Pulse (!) 56   Temp 98.1 F (36.7 C) (Oral)   Resp 18   Ht '5\' 10"'$  (1.778 m)   Wt 97.5 kg   SpO2 96%   BMI 30.85 kg/m  Physical Exam Vitals and nursing note reviewed.  Constitutional:      Appearance: He is well-developed.  HENT:     Head: Normocephalic and atraumatic.  Cardiovascular:     Rate and Rhythm: Normal rate and regular rhythm.     Pulses:          Radial pulses are 2+ on the right side.  Pulmonary:     Effort: Pulmonary effort is normal.  Abdominal:     General: There is no distension.  Musculoskeletal:     Right shoulder: Tenderness (mild) present. No swelling or deformity. Normal range of motion.     Right elbow: Swelling present. Normal range of motion. Tenderness present.     Right hand: Swelling (over base of right thumb) and tenderness present.     Right knee: Normal range of motion. No tenderness.       Legs:     Comments: Abrasions/skin tears to right proximal forearm  Skin:    General: Skin is warm and dry.  Neurological:     Mental Status: He is alert.      ED Results / Procedures / Treatments   Labs (all labs ordered are listed, but only abnormal results are displayed) Labs Reviewed - No data to display  EKG None  Radiology DG Hand Complete Right  Result Date: 05/28/2022 CLINICAL DATA:  Fall EXAM: RIGHT HAND - COMPLETE 3+ VIEW COMPARISON:  None Available. FINDINGS: There is no acute fracture or dislocation. There is deformity of the little finger metacarpal which may reflect prior trauma. Alignment is normal. The joint spaces are preserved. There is no erosive change. There is no significant soft tissue swelling. There is no soft tissue gas or radiopaque foreign body. IMPRESSION: No acute fracture or dislocation. Electronically Signed   By: Valetta Mole M.D.   On: 05/28/2022 08:18   DG Elbow Complete Right  Result Date: 05/28/2022 CLINICAL DATA:  Fall EXAM: RIGHT ELBOW - COMPLETE 3+ VIEW COMPARISON:  None Available. FINDINGS: There is no acute fracture or dislocation. Alignment is normal. The joint spaces are preserved. There is no erosive change. There is no effusion. The soft tissues are unremarkable. IMPRESSION: No acute fracture or dislocation. Electronically Signed   By: Valetta Mole M.D.   On: 05/28/2022 08:15   DG Shoulder Right  Result Date: 05/28/2022 CLINICAL DATA:  Fall EXAM: RIGHT SHOULDER - 2+ VIEW COMPARISON:  None Available. FINDINGS: There is no acute fracture or dislocation. Glenohumeral and acromioclavicular alignment is normal. The joint spaces are maintained with no significant degenerative changes. The soft tissues are unremarkable. Remote right-sided rib fractures are seen. IMPRESSION: No acute fracture or dislocation. Electronically Signed   By: Valetta Mole M.D.   On: 05/28/2022 08:14    Procedures Procedures    Medications Ordered in ED Medications - No data to display  ED Course/ Medical Decision Making/ A&P                           Medical Decision Making Amount and/or Complexity of Data  Reviewed Radiology: ordered and independent interpretation performed.    Details: No fractures or dislocations to shoulder, elbow, hand/thumb   Patient is on Eliquis but did not hit his head and has not had a headache at all during this course.  Low suspicion for CNS injury or need for CT head.  Tdap is up-to-date according to patient.  The painful areas were imaged and no fracture/dislocation seen.  Wounds are all superficial and have been cleaned by nurse and bandaged.  Stable for discharge home with return precautions.        Final  Clinical Impression(s) / ED Diagnoses Final diagnoses:  Fall, initial encounter  Contusion of right elbow, initial encounter  Contusion of right thumb without damage to nail, initial encounter  Multiple abrasions    Rx / DC Orders ED Discharge Orders     None         Sherwood Gambler, MD 05/28/22 [phone]

## 2022-05-28 NOTE — ED Triage Notes (Signed)
Pt walking dogs outside this morning and got tangled in the leashes. Pt fell and hit rt arm and elbow and side of upper rt calf. Rt hand and thumb are tender and swollen. Pt is on eliquis.

## 2022-05-28 NOTE — Discharge Instructions (Addendum)
If you develop headache, dizziness, new or worsening swelling or any other new/concerning symptoms then return to the ER for evaluation.  Otherwise you may take Tylenol and apply ice to the affected areas.  Keep the wounds clean.

## 2022-06-06 ENCOUNTER — Encounter (HOSPITAL_BASED_OUTPATIENT_CLINIC_OR_DEPARTMENT_OTHER): Payer: Self-pay

## 2022-06-06 ENCOUNTER — Ambulatory Visit (HOSPITAL_BASED_OUTPATIENT_CLINIC_OR_DEPARTMENT_OTHER): Payer: Medicare Other | Admitting: Cardiology

## 2022-07-02 ENCOUNTER — Ambulatory Visit (HOSPITAL_BASED_OUTPATIENT_CLINIC_OR_DEPARTMENT_OTHER): Payer: Medicare Other | Admitting: Cardiology

## 2022-07-02 ENCOUNTER — Encounter (HOSPITAL_BASED_OUTPATIENT_CLINIC_OR_DEPARTMENT_OTHER): Payer: Self-pay | Admitting: Cardiology

## 2022-07-02 VITALS — BP 144/88 | HR 68 | Ht 70.0 in | Wt 214.3 lb

## 2022-07-02 DIAGNOSIS — I255 Ischemic cardiomyopathy: Secondary | ICD-10-CM

## 2022-07-02 DIAGNOSIS — I10 Essential (primary) hypertension: Secondary | ICD-10-CM | POA: Diagnosis not present

## 2022-07-02 DIAGNOSIS — Z86711 Personal history of pulmonary embolism: Secondary | ICD-10-CM

## 2022-07-02 DIAGNOSIS — I2510 Atherosclerotic heart disease of native coronary artery without angina pectoris: Secondary | ICD-10-CM | POA: Diagnosis not present

## 2022-07-02 DIAGNOSIS — E785 Hyperlipidemia, unspecified: Secondary | ICD-10-CM | POA: Diagnosis not present

## 2022-07-02 NOTE — Progress Notes (Signed)
Cardiology Office Note:    Date:  07/02/2022   ID:  Siraj Dermody, DOB 1955/11/23, MRN [id number]  PCP:  Reynold Bowen, MD  Cardiologist:  Buford Dresser, MD PhD  Referring MD: Reynold Bowen, MD   JE:HUDJSH up  History of Present Illness:    Jesse Coleman is a 66 y.o. male with a hx of LBBB, CAD (2014), HTN, obesity who is seen for follow up. I met him during a hospitalization for anaphylaxis, for which he was discharged on 04/24/2019. Notes reviewed. He had demand ischemia during this event 2/2 anaphylaxis.  Cardiac history, obtained from the patient and Care Everywhere: -History of chest pain starting 06/2012, progressive. Symptoms were burning chest pain that spread to shoulders, arms, and back. Worse with exertion, better with rest. Associated with shortness of breath and diaphoresis -Echo stress test 01/2013 showed LBBB, EF 40-45% with global hypokinesis.  -Cath 01/2013 showed small caliber CAD in nondominant RCA, diagonal, and OM not amenable to PCI.  -treated with medical therapy (metoprolol, aspirin, atorvastatin, ranolazine). Declined imdur due to interaction with PDE5 inhibitors. Had SL NG, felt terrible when he took this. -amlodipine added by Dr. Baron Hamper at [facility], also recommended medical management. Amlodipine stopped due to LE edema. Metoprolol succinate uptitrated.   At his visit on 09/21/2019 he reported he was doing well. No cardiac symptoms. He was able to spend 35-55 min on a treadmill at >3 mph, walked with his dog with no issues.He did report some insominia not related to shortness of breath or chest pain. No issues with his blood thinner and his blood pressure at home was around 120s/70s. We discussed covid and the vaccinations, gave my recommendations but defer to his pulm/allergy team.  At his last visit, via video due to COVID infection, he had done a home test for COVID on 1/2 and it was positive. He was experiencing body aches, nausea, fever, lack of  appetite. His breathing was ok. Before COVID he reported he was doing well, working out regularly on the treadmill and using free weights. Home blood pressures were 120-130/70-80.   Today, he is having to use nitroglycerin 4 times in the past 2 years. The last time was about 2 months ago and was triggered by a stressor from work. He felt his heart pounding very hard, he had a burning sensation and then his arms went weak. It went away about 40 minutes after he took a single dose of the nitroglycerin  At home his blood pressure is around 120s/ 70s. It was high in office today. 144/88, but he said this is probably due to him being aggravated today.   The most strenuous thing he has recently done is walk 1-2 miles at pace without any issues. Does not do any lifting or have to climb stairs. He has recently been able to get his weight down to 211 pounds.   He denies any palpitations, shortness of breath, or peripheral edema. No lightheadedness, headaches, syncope, orthopnea, or PND.    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  (Patient not taking: Reported on 08/14/2021)   bromocriptine (PARLODEL) 2.5 MG tablet  Take 1.25 mg by mouth at bedtime.    buPROPion (WELLBUTRIN XL) 150 MG 24 hr tablet Take 150 mg by mouth at bedtime.    ELIQUIS 5 MG TABS tablet TAKE 1 TABLET BY MOUTH TWICE A DAY.   EPINEPHrine 0.3 mg/0.3 mL IJ SOAJ injection Inject 0.3 mLs (0.3 mg total) into the muscle as needed for anaphylaxis. (Patient not taking: Reported on 08/14/2021)   famotidine (PEPCID) 20 MG tablet Take 1 tablet (20 mg total) by mouth 2 (two) times daily. (Patient taking differently: Take 20 mg by mouth daily.)   isosorbide mononitrate (IMDUR) 30 MG 24 hr tablet Take 30 mg by mouth in the morning and at bedtime.   LORazepam (ATIVAN) 1 MG tablet Take 1 mg by mouth every 8 (eight) hours as needed for anxiety.  (Patient not taking: Reported on 08/14/2021)   metoprolol succinate (TOPROL-XL) 100 MG 24 hr tablet Take 100 mg by mouth daily. Take with or immediately following a meal.   NITROSTAT 0.4 MG SL tablet Place 0.4 mg under the tongue every 5 (five) minutes as needed for chest pain.  (Patient not taking: Reported on 08/14/2021)   sildenafil (VIAGRA) 100 MG tablet TAKE 1 TABLET BY MOUTH 30CMINUTES PRIOR TO 1HR BEFORE RELATIONS.   No current facility-administered medications on file prior to visit.     Allergies:   Atorvastatin, Augmentin [amoxicillin-pot clavulanate], Latex, and Rosuvastatin   Social History   Tobacco Use   Smoking status: Never   Smokeless tobacco: Never  Substance Use Topics   Alcohol use: Yes    Alcohol/week: 1.0 standard drink of alcohol    Types: 1 drink(s) per week   Drug use: No    Family History: The patient's family history includes Coronary artery disease (age of onset: 20) in his father; Emphysema in his mother.  ROS:   Please see the history of present illness. (+) stress (+) chest discomfort All other systems are reviewed and negative.     EKGs/Labs/Other Studies Reviewed:    The following studies were reviewed today: Echo 06/10/19  1. The left ventricle has normal  systolic function, with an ejection fraction of 55-60%. There is moderate concentric left ventricular hypertrophy. Left ventricular diastolic function could not be evaluated.  2. The right ventricle has moderately reduced systolic function. The cavity was moderately enlarged. Right ventricular systolic pressure is mildly elevated with an estimated pressure of 40.3 mmHg.  3. Left atrial size was moderately dilated.  4. Right atrial size was moderately dilated.  5. Mild thickening of the mitral valve leaflet. No evidence of mitral valve stenosis.  6. Tricuspid valve regurgitation is moderate.  7. The aortic valve is tricuspid Moderate thickening of the aortic valve Moderate calcification of the aortic valve. Aortic valve regurgitation is trivial by color flow Doppler. No stenosis of the aortic valve.  EKG:  EKG is personally reviewed.   07/02/2022: NSR, LBBB at 68 bpm  06/11/19:  demonstrates NSR, iLBBB, first degree AV block  Recent Labs: 08/08/2021: ALT 11; BUN 17; Creatinine, Ser 1.15; Hemoglobin 14.9; Platelets 177; Potassium 4.4; Sodium 136  Recent Lipid Panel No results found for: "CHOL", "TRIG", "HDL", "CHOLHDL", "VLDL", "LDLCALC", "LDLDIRECT"  Physical Exam:    VS:  BP (!) 144/88 (BP Location: Right Arm, Patient Position: Sitting, Cuff Size: Large)   Pulse 68   Ht '5\' 10"'  (1.778 m)   Wt 214 lb 4.8 oz (97.2 kg)   BMI 30.75 kg/m     Wt Readings from Last 3 Encounters:  05/28/22 215 lb (97.5 kg)  08/14/21 243 lb 1.6 oz (110.3 kg)  10/09/20 242 lb (109.8 kg)    GEN: Well nourished, well developed in no acute distress HEENT: Normal, moist mucous membranes NECK: No JVD CARDIAC: regular rhythm, normal S1 and S2, no rubs or gallops. No murmur. VASCULAR: Radial and DP pulses 2+ bilaterally. No carotid bruits RESPIRATORY:  Clear to auscultation without rales, wheezing or rhonchi  ABDOMEN: Soft, non-tender, non-distended MUSCULOSKELETAL:  Ambulates independently SKIN: Warm and dry, no  edema NEUROLOGIC:  Alert and oriented x 3. No focal neuro deficits noted. PSYCHIATRIC:  Normal affect   ASSESSMENT:    1. Coronary artery disease involving native coronary artery of native heart without angina pectoris   2. Essential hypertension   3. Ischemic cardiomyopathy   4. Hyperlipidemia LDL goal <70   5. History of pulmonary embolism     PLAN:    CAD, with history of refractory angina: now without angina -will stop aspirin since plan for apixaban is indefinite -continue imdur, feels better on BID dosing -continue metoprolol succinate -amlodipine stopped while admitted. Currently chest pain free, but consider restarting if angina recurs -on ranexa in the past, unclear why he stopped -counseled that NSAIDs are contraindicated in CAD, and he plans to talk to Dr. Forde Dandy about alternative pain management options -has sildenafil on med list. Counseled on risk of interaction with SL NG, imdur. Not currently taking.  Ischemic cardiomyopathy: now with recovered EF -continue metoprolol succinate -was on lisinopril in the past, held during admission for AKI. See below -NYHA class 1, euvolemic today, excellent exercise tolerance -encouraged continued exercise  Hypertension: above goal here, home readings at goal -continue current medications -if needed, consider adding back ACEi/ARB, will need to follow kidney function. Or consider amlodipine given history of angina  Hyperlipidemia: goal LDL <70.  -intolerant of atorvastatin and rosuvastatin in the past (myalgia). Tolerating low dose rosuvastatin -he is not sure why he stopped ezetimibe. We discussed retrial, he wants to hold on this -nervous re: PCSK9 inhibitors, both cost and side effects -discussed nexletol pros/cons.  History of pulmonary embolism: -on apixaban 5 mg BID -unprovoked. Hypercoagulable workup by Dr. Delton Coombes unrevealing.   Cardiac risk counseling and prevention recommendations: -recommend heart  healthy/Mediterranean diet, with whole grains, fruits, vegetable, fish, lean meats, nuts, and olive oil. Limit salt. -recommend moderate walking, 3-5 times/week for 30-50 minutes each session. Aim for at least 150 minutes.week. Goal should be pace of 3 miles/hours, or walking 1.5 miles in 30 minutes -recommend avoidance of tobacco products. Avoid excess alcohol.  Discussion re: Covid. I told him it is not my area of expertise, but my opinion would be that with unexplained anaphylaxis, I would be cautious about getting the covid vaccine unless he could have a trial/patch test/etc to make sure he would tolerate. I recommended he discuss with his other specialists to get a specific recommendation. I am very in favor  of the vaccines in general but do not want to precipitate another episode of anaphylaxis.  Plan for follow up: 1 year, sooner if needed   Buford Dresser, MD, PhD [place]  [facility]   Medication Adjustments/Labs and Tests Ordered: Current medicines are reviewed at length with the patient today.  Concerns regarding medicines are outlined above.  Orders Placed This Encounter  Procedures   EKG 12-Lead   No orders of the defined types were placed in this encounter.   Patient Instructions  Medication Instructions:  Your physician recommends that you continue on your current medications as directed. Please refer to the Current Medication list given to you today.   *If you need a refill on your cardiac medications before your next appointment, please call your pharmacy*  Lab Work: NONE  Testing/Procedures: NONE  Follow-Up: At [facility], you and your health needs are our priority.  As part of our continuing mission to provide you with exceptional heart care, we have created designated Provider Care Teams.  These Care Teams include your primary Cardiologist (physician) and Advanced Practice Providers (APPs -  Physician Assistants and Nurse Practitioners)  who all work together to provide you with the care you need, when you need it.  We recommend signing up for the patient portal called "MyChart".  Sign up information is provided on this After Visit Summary.  MyChart is used to connect with patients for Virtual Visits (Telemedicine).  Patients are able to view lab/test results, encounter notes, upcoming appointments, etc.  Non-urgent messages can be sent to your provider as well.   To learn more about what you can do with MyChart, go to [email or web address].    Your next appointment:   12 month(s)  The format for your next appointment:   In Person  Provider:   Buford Dresser, MD     Other Instructions  We are going to give you samples of bempedoic acid (Nexletol). If this works well with your body, let us know and we will send it in to your pharmacy. We are out of samples today but will call you when we get them in.  If the medication is too expensive, or you don't tolerate it, let us know and we will send you to talk to our PharmD team.     Alphonzo Grieve as a scribe for Buford Dresser, MD.,have documented all relevant documentation on the behalf of Buford Dresser, MD,as directed by  Buford Dresser, MD while in the presence of Buford Dresser, MD.   I, Buford Dresser, MD, have reviewed all documentation for this visit. The documentation on 10/20/22 for the exam, diagnosis, procedures, and orders are all accurate and complete.   Signed, Buford Dresser, MD PhD 07/02/2022  [place]

## 2022-07-02 NOTE — Patient Instructions (Addendum)
Medication Instructions:  Your physician recommends that you continue on your current medications as directed. Please refer to the Current Medication list given to you today.   *If you need a refill on your cardiac medications before your next appointment, please call your pharmacy*  Lab Work: NONE  Testing/Procedures: NONE  Follow-Up: At [facility], you and your health needs are our priority.  As part of our continuing mission to provide you with exceptional heart care, we have created designated Provider Care Teams.  These Care Teams include your primary Cardiologist (physician) and Advanced Practice Providers (APPs -  Physician Assistants and Nurse Practitioners) who all work together to provide you with the care you need, when you need it.  We recommend signing up for the patient portal called "MyChart".  Sign up information is provided on this After Visit Summary.  MyChart is used to connect with patients for Virtual Visits (Telemedicine).  Patients are able to view lab/test results, encounter notes, upcoming appointments, etc.  Non-urgent messages can be sent to your provider as well.   To learn more about what you can do with MyChart, go to [email or web address].    Your next appointment:   12 month(s)  The format for your next appointment:   In Person  Provider:   Buford Dresser, MD     Other Instructions  We are going to give you samples of bempedoic acid (Nexletol). If this works well with your body, let us know and we will send it in to your pharmacy. We are out of samples today but will call you when we get them in.  If the medication is too expensive, or you don't tolerate it, let us know and we will send you to talk to our PharmD team.

## 2022-07-11 ENCOUNTER — Telehealth (HOSPITAL_BASED_OUTPATIENT_CLINIC_OR_DEPARTMENT_OTHER): Payer: Self-pay

## 2022-07-11 NOTE — Telephone Encounter (Signed)
Nexletol samples placed at front for patient and patient notified via voicemail.   Lot# [id number] Exp Date 8/26

## 2022-08-08 ENCOUNTER — Other Ambulatory Visit: Payer: Self-pay | Admitting: Endocrinology

## 2022-08-08 ENCOUNTER — Ambulatory Visit
Admission: RE | Admit: 2022-08-08 | Discharge: 2022-08-08 | Disposition: A | Discharge status: Home / Self Care | Payer: Medicare Other | Source: Ambulatory Visit | Attending: Endocrinology | Admitting: Endocrinology

## 2022-08-08 DIAGNOSIS — R109 Unspecified abdominal pain: Secondary | ICD-10-CM

## 2022-08-08 DIAGNOSIS — N23 Unspecified renal colic: Secondary | ICD-10-CM

## 2022-08-08 MED ORDER — IOPAMIDOL (ISOVUE-300) INJECTION 61%
100.0000 mL | Freq: Once | INTRAVENOUS | Status: AC | PRN
  Administered 2022-08-08: 100 mL via INTRAVENOUS

## 2022-08-14 ENCOUNTER — Inpatient Hospital Stay: Payer: Medicare Other | Attending: Hematology

## 2022-08-14 DIAGNOSIS — Z86711 Personal history of pulmonary embolism: Secondary | ICD-10-CM | POA: Insufficient documentation

## 2022-08-14 DIAGNOSIS — I2699 Other pulmonary embolism without acute cor pulmonale: Secondary | ICD-10-CM

## 2022-08-14 DIAGNOSIS — Z7901 Long term (current) use of anticoagulants: Secondary | ICD-10-CM | POA: Diagnosis not present

## 2022-08-14 LAB — D-DIMER, QUANTITATIVE: D-Dimer, Quant: 0.3 ug/mL-FEU (ref 0.00–0.50)

## 2022-08-20 NOTE — Progress Notes (Unsigned)
[address] [place], [place] [address]   CLINIC:  Medical Oncology/Hematology  PCP:  Reynold Bowen, [place] [address] [phone]   REASON FOR VISIT:  Follow-up for history of unprovoked pulmonary embolism  CURRENT THERAPY: Eliquis  INTERVAL HISTORY:  Jesse Coleman 67 y.o. male returns for routine follow-up of his history of unprovoked pulmonary embolism.  He was last seen by Dr. Delton Coombes on 08/14/2021.  At today's visit, he reports feeling fair.  No recent hospitalizations, surgeries, or changes in baseline health status.  He has not had any recurrent DVT or PE in the year since his last visit.  No unilateral leg swelling, pain, and erythema. He denies any shortness of breath, dyspnea on exertion, chest pain, cough, hemoptysis, and palpitations.  He takes Eliquis as prescribed without any missed doses.  He denies any rectal bleeding or melena.  He lost 30 pounds in the past year secondary to diet changes.  He has 70% energy and 100% appetite. He endorses that he is maintaining a stable weight.   REVIEW OF SYSTEMS:  Review of Systems  Constitutional:  Negative for appetite change, chills, diaphoresis, fatigue, fever and unexpected weight change.  HENT:   Negative for lump/mass and nosebleeds.   Eyes:  Negative for eye problems.  Respiratory:  Negative for cough, hemoptysis and shortness of breath.   Cardiovascular:  Negative for chest pain, leg swelling and palpitations.  Gastrointestinal:  Negative for abdominal pain, blood in stool, constipation, diarrhea, nausea and vomiting.  Genitourinary:  Positive for difficulty urinating (slow stream). Negative for hematuria.   Skin: Negative.   Neurological:  Negative for dizziness, headaches and light-headedness.  Hematological:  Does not bruise/bleed easily.      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   Medical therapy-1999   Past Surgical History:  Procedure Laterality Date   CARDIAC CATHETERIZATION  2014   LAD 40%, D1&D2 70-80% (1.21m), CFX 20%, OM1 & OM2 90% (1.O mm), OM3 50%, RCA 90% (1.5-1.75 mm), EF 40-45%, med rx   COLONOSCOPY  2008   [facility] [place]  2007   HERNIA REPAIR  2010   [facility]    REFRACTIVE SURGERY Right 2003   VASECTOMY  2003     SOCIAL HISTORY:  Social History   Socioeconomic History   Marital status: Married    Spouse name: Not on file   Number of children: 2   Years of education: Not on file   Highest education level: Not on file  Occupational History   Occupation: Real estate sScientist, clinical (histocompatibility and immunogenetics) [employer]   Tobacco Use   Smoking status: Never   Smokeless tobacco: Never  Substance and Sexual Activity   Alcohol use: Yes    Alcohol/week: 1.0 standard drink of alcohol    Types: 1 drink(s) per week   Drug use: No   Sexual activity: Not on file  Other Topics Concern   Not on file  Social History Narrative   Not on file   Social Determinants of Health   Financial Resource Strain: Low Risk  (08/08/2020)   Overall Financial Resource Strain (CARDIA)    Difficulty of Paying Living  Expenses: Not hard at all  Food Insecurity: No Food Insecurity (08/08/2020)   Hunger Vital Sign    Worried About Running Out of Food in the Last Year: Never true    Ran Out of Food in the Last Year: Never true  Transportation Needs: No Transportation Needs (08/08/2020)   PRAPARE - Hydrologist (Medical): No    Lack of Transportation (Non-Medical): No  Physical Activity: Insufficiently Active (08/08/2020)   Exercise Vital Sign    Days of Exercise per Week: 1 day    Minutes of Exercise per Session: 40 min  Stress: No Stress Concern Present  (08/08/2020)   [place]    Feeling of Stress : Not at all  Social Connections: Moderately Integrated (08/08/2020)   Social Connection and Isolation Panel [NHANES]    Frequency of Communication with Friends and Family: More than three times a week    Frequency of Social Gatherings with Friends and Family: Once a week    Attends Religious Services: More than 4 times per year    Active Member of [employer] or Organizations: No    Attends Archivist Meetings: Never    Marital Status: Married  Human resources officer Violence: Not At Risk (08/08/2020)   Humiliation, Afraid, Rape, and Kick questionnaire    Fear of Current or Ex-Partner: No    Emotionally Abused: No    Physically Abused: No    Sexually Abused: No    FAMILY HISTORY:  Family History  Problem Relation Age of Onset   Coronary artery disease Father 53       sudden cardiac death   Emphysema Mother        smoked    CURRENT MEDICATIONS:  Outpatient Encounter Medications as of 08/21/2022  Medication Sig Note   bromocriptine (PARLODEL) 2.5 MG tablet Take 1.25 mg by mouth at bedtime.  06/10/2019: 0.5 tablet = 1..25 mg   buPROPion (WELLBUTRIN XL) 150 MG 24 hr tablet Take 150 mg by mouth at bedtime.     ELIQUIS 5 MG TABS tablet TAKE 1 TABLET BY MOUTH TWICE A DAY.    EPINEPHrine 0.3 mg/0.3 mL IJ SOAJ injection Inject 0.3 mLs (0.3 mg total) into the muscle as needed for anaphylaxis.    isosorbide mononitrate (IMDUR) 30 MG 24 hr tablet Take 30 mg by mouth in the morning and at bedtime.    LORazepam (ATIVAN) 1 MG tablet Take 1 mg by mouth every 8 (eight) hours as needed for anxiety.    metoprolol succinate (TOPROL-XL) 100 MG 24 hr tablet Take 100 mg by mouth daily. Take with or immediately following a meal.    NITROSTAT 0.4 MG SL tablet Place 0.4 mg under the tongue every 5 (five) minutes as needed for chest pain.    rosuvastatin (CRESTOR) 5 MG tablet Take 5 mg by mouth  once a week.    sildenafil (VIAGRA) 100 MG tablet TAKE 1 TABLET BY MOUTH 30CMINUTES PRIOR TO 1HR BEFORE RELATIONS.    No facility-administered encounter medications on file as of 08/21/2022.    ALLERGIES:  Allergies  Allergen Reactions   Atorvastatin Other (See Comments)   Augmentin [Amoxicillin-Pot Clavulanate] Nausea And Vomiting   Latex Swelling and Other (See Comments)    Lips swell, but no breathing issues   Rosuvastatin Other (See Comments)     PHYSICAL EXAM:  ECOG PERFORMANCE STATUS: 0 - Asymptomatic  There were no vitals filed for this visit. There  were no vitals filed for this visit. Physical Exam Constitutional:      Appearance: Normal appearance. He is obese.  HENT:     Head: Normocephalic and atraumatic.     Mouth/Throat:     Mouth: Mucous membranes are moist.  Eyes:     Extraocular Movements: Extraocular movements intact.     Pupils: Pupils are equal, round, and reactive to light.  Cardiovascular:     Rate and Rhythm: Normal rate and regular rhythm.     Pulses: Normal pulses.     Heart sounds: Normal heart sounds.  Pulmonary:     Effort: Pulmonary effort is normal.     Breath sounds: Normal breath sounds.  Abdominal:     General: Bowel sounds are normal.     Palpations: Abdomen is soft.     Tenderness: There is no abdominal tenderness.  Musculoskeletal:        General: No swelling.     Right lower leg: No edema.     Left lower leg: No edema.  Lymphadenopathy:     Cervical: No cervical adenopathy.  Skin:    General: Skin is warm and dry.  Neurological:     General: No focal deficit present.     Mental Status: He is alert and oriented to person, place, and time.  Psychiatric:        Mood and Affect: Mood normal.        Behavior: Behavior normal.      LABORATORY DATA:  I have reviewed the labs as listed.  CBC    Component Value Date/Time   WBC 6.1 08/08/2021 0853   RBC 4.56 08/08/2021 0853   HGB 14.9 08/08/2021 0853   HCT 44.0 08/08/2021  0853   PLT 177 08/08/2021 0853   MCV 96.5 08/08/2021 0853   MCH 32.7 08/08/2021 0853   MCHC 33.9 08/08/2021 0853   RDW 12.8 08/08/2021 0853   LYMPHSABS 1.7 08/08/2021 0853   MONOABS 0.7 08/08/2021 0853   EOSABS 0.2 08/08/2021 0853   BASOSABS 0.0 08/08/2021 0853      Latest Ref Rng & Units 08/08/2021    8:53 AM 08/01/2020   12:03 PM 06/12/2019    3:25 AM  CMP  Glucose 70 - 99 mg/dL 93  100  85   BUN 8 - 23 mg/dL '17  18  12   '$ Creatinine 0.61 - 1.24 mg/dL 1.15  1.19  1.45   Sodium 135 - 145 mmol/L 136  135  138   Potassium 3.5 - 5.1 mmol/L 4.4  4.4  4.3   Chloride 98 - 111 mmol/L 103  104  108   CO2 22 - 32 mmol/L '26  25  23   '$ Calcium 8.9 - 10.3 mg/dL 9.2  9.5  9.4   Total Protein 6.5 - 8.1 g/dL 6.8  6.4    Total Bilirubin 0.3 - 1.2 mg/dL 0.8  0.8    Alkaline Phos 38 - 126 U/L 68  51    AST 15 - 41 U/L 20  19    ALT 0 - 44 U/L 11  10      DIAGNOSTIC IMAGING:  I have independently reviewed the relevant imaging and discussed with the patient.  ASSESSMENT & PLAN: 1.  Unprovoked pulmonary embolism: - Referred by Dr. Vaughan Browner for unprovoked pulmonary embolism. - CT angiogram on 06/10/2019-acute bilateral extensive pulmonary thrombus, no saddle thrombus, suspicion for right heart strain.  No pulmonary infarct/effusion.  Questionable thrombus in the IVC and right  inferior pulmonary vein. - 2D echo 10/18/2018 with EF 55-60%.  Right ventricle moderately reduced systolic function.  Cavity moderately enlarged. - Hypercoagulable work-up including factor V Leiden, prothrombin gene mutation, Antithrombin III, lupus anticoagulant, anticardiolipin antibody and beta-2 glycoprotein 1 antibody were negative. - His estranged son also has DVT and some abnormality with protein S. - JAK2 V617F testing was negative. - Colonoscopy in 2007 in [place] was within normal limits.  He is a non-smoker. - Anaphylactic reaction in July 2020 (approximately 8 weeks prior to PE) lasting for 4 days with swelling of  his abdomen and lower extremities. - He is continuing to tolerate Eliquis without any major problems.  No bleeding issues.   - D-dimer (08/14/2022) normal at 0.30 - No recurrent blood clots since being placed on Eliquis in September 2020. - PLAN: Labs (CBC/D, CMP, D-dimer) in 1 year.  Phone visit after.   PLAN SUMMARY: >> Labs (CBC/D, CMP, D-dimer) and phone visit in 1 year   All questions were answered. The patient knows to call the clinic with any problems, questions or concerns.  Medical decision making: Low  Time spent on visit: I spent 15 minutes counseling the patient face to face. The total time spent in the appointment was 22 minutes and more than 50% was on counseling.   Harriett Rush, PA-C  08/21/22 12:25 PM

## 2022-08-21 ENCOUNTER — Inpatient Hospital Stay: Payer: Medicare Other | Admitting: Physician Assistant

## 2022-08-21 ENCOUNTER — Other Ambulatory Visit: Payer: Self-pay

## 2022-08-21 VITALS — BP 129/77 | HR 66 | Temp 98.4°F | Resp 16 | Ht 70.0 in | Wt 211.0 lb

## 2022-08-21 DIAGNOSIS — I2699 Other pulmonary embolism without acute cor pulmonale: Secondary | ICD-10-CM

## 2022-08-21 DIAGNOSIS — Z7901 Long term (current) use of anticoagulants: Secondary | ICD-10-CM

## 2022-08-21 DIAGNOSIS — Z86711 Personal history of pulmonary embolism: Secondary | ICD-10-CM | POA: Diagnosis not present

## 2022-10-02 ENCOUNTER — Encounter (HOSPITAL_BASED_OUTPATIENT_CLINIC_OR_DEPARTMENT_OTHER): Payer: Self-pay

## 2022-10-20 ENCOUNTER — Encounter (HOSPITAL_BASED_OUTPATIENT_CLINIC_OR_DEPARTMENT_OTHER): Payer: Self-pay | Admitting: Cardiology

## 2023-05-08 ENCOUNTER — Telehealth (HOSPITAL_BASED_OUTPATIENT_CLINIC_OR_DEPARTMENT_OTHER): Payer: Self-pay | Admitting: Cardiology

## 2023-05-08 NOTE — Telephone Encounter (Signed)
Patient wants a provider switch from Dr. Cristal Deer to Dr. Anne Fu per Dr. Evlyn Kanner.  Please confirm.

## 2023-05-12 NOTE — Telephone Encounter (Signed)
Ok by me

## 2023-05-14 ENCOUNTER — Encounter (HOSPITAL_BASED_OUTPATIENT_CLINIC_OR_DEPARTMENT_OTHER): Payer: Self-pay | Admitting: Cardiology

## 2023-05-14 ENCOUNTER — Ambulatory Visit (HOSPITAL_BASED_OUTPATIENT_CLINIC_OR_DEPARTMENT_OTHER): Payer: Medicare Other | Admitting: Cardiology

## 2023-05-14 VITALS — BP 136/74 | HR 72 | Ht 70.0 in | Wt 197.0 lb

## 2023-05-14 DIAGNOSIS — I10 Essential (primary) hypertension: Secondary | ICD-10-CM

## 2023-05-14 DIAGNOSIS — E785 Hyperlipidemia, unspecified: Secondary | ICD-10-CM

## 2023-05-14 DIAGNOSIS — I2510 Atherosclerotic heart disease of native coronary artery without angina pectoris: Secondary | ICD-10-CM

## 2023-05-14 MED ORDER — NEXLIZET 180-10 MG PO TABS: 1.0000 | ORAL_TABLET | Freq: Every day | ORAL | 3 refills | Status: DC

## 2023-05-14 NOTE — Progress Notes (Signed)
Cardiology Office Note:  .   Date:  05/14/2023  ID:  Jesse Coleman, DOB 1956-08-25, MRN [id number] PCP: Adrian Prince, MD  [facility] Providers Cardiologist:  Donato Schultz, MD    History of Present Illness: .   Discussed the use of AI scribe software for clinical note transcription with the patient, who gave verbal consent to proceed.  History of Present Illness   The patient is a 67 year old male with a history of coronary artery disease, left bundle branch block, and hypertension. The patient's coronary artery disease was first manifested as chest pain in 2013, characterized by a burning sensation spreading to the arms and back, and worsening with exertion. A stress test in 2014 revealed an ejection fraction of 40-45%, leading to a cardiac catheterization in May 2014. The catheterization showed a small caliber, non-dominant right coronary artery, diagonal, and obtuse marginal branch, which were not amenable to percutaneous coronary intervention. The patient was managed medically with metoprolol, aspirin, atorvastatin, and ranolazine. Isosorbide was declined due to interaction with Viagra. Amlodipine was added by a cardiologist at [facility] but was later discontinued due to lower extremity edema, and the metoprolol dose was increased. An echocardiogram in September 2020 showed an improved ejection fraction of 55-60% with moderate biatrial enlargement. The patient's left bundle branch block has been chronic.  The patient also has a history of unprovoked pulmonary embolism and is currently on apixaban. A hypercoagulable workup was unrevealing. The patient has had difficulty tolerating statins, specifically atorvastatin and rosuvastatin, due to myalgias. However, he was able to tolerate a low dose of rosuvastatin. The patient expressed concern about the cost of Repatha.  The patient has experienced significant weight loss, from 275 pounds to 197 pounds, through dietary changes and increased  physical activity. The patient reported having to use nitroglycerin four to five times, primarily during sexual activity and stressful situations. The patient also reported having a history of blood clots, which were managed with anticoagulation therapy.       Studies Reviewed: Marland Kitchen   EKG Interpretation Date/Time:  Wednesday May 14 2023 11:33:45 EDT Ventricular Rate:  72 PR Interval:  212 QRS Duration:  130 QT Interval:  376 QTC Calculation: 411 R Axis:   2  Text Interpretation: Sinus rhythm with 1st degree A-V block Left bundle branch block When compared with ECG of 11-Jun-2019 05:32, No significant change since last tracing Confirmed by Donato Schultz ([id number]) on 05/14/2023 11:48:56 AM    DIAGNOSTIC Echocardiogram: Ejection fraction 55-60%, moderate biatrial enlargement (06/10/2019) Cardiac catheterization: Small caliber nondominant right coronary artery, diagonal and obtuse marginal not amenable to percutaneous coronary intervention (01/2013) Stress test: Ejection fraction 40-45% (2014)  Risk Assessment/Calculations:            Physical Exam:   VS:  BP 136/74   Pulse 72   Ht 5\' 10"  (1.778 m)   Wt 197 lb (89.4 kg)   BMI 28.27 kg/m    Wt Readings from Last 3 Encounters:  05/14/23 197 lb (89.4 kg)  08/21/22 211 lb (95.7 kg)  07/02/22 214 lb 4.8 oz (97.2 kg)    GEN: Well nourished, well developed in no acute distress NECK: No JVD; No carotid bruits CARDIAC: RRR, no murmurs, rubs, gallops RESPIRATORY:  Clear to auscultation without rales, wheezing or rhonchi  ABDOMEN: Soft, non-tender, non-distended EXTREMITIES:  No edema; No deformity   ASSESSMENT AND PLAN: .   Assessment and Plan    Coronary Artery Disease Stable on medical management. History of chest pain, stress  test showed EF of 40-45%, cardiac catheterization showed small caliber nondominant RCA, diagonal and OM not amenable to PCI. Currently on metoprolol, aspirin, ranolazine, and low dose rosuvastatin. -Continue  current medications. -Nexlizit, pending cost evaluation at the pharmacy.  Left Bundle Branch Block Chronic and stable. -No changes to current management.  Hyperlipidemia Difficulty tolerating statins due to myalgias. Currently on low dose rosuvastatin. -Continue low dose rosuvastatin. -Adding Nexlizit, pending cost evaluation at the pharmacy.  History of Unprovoked Pulmonary Embolism Stable on apixaban. Hypercoagulable workup was unrevealing. -Continue apixaban.  General Health Maintenance Significant weight loss achieved through lifestyle modifications. -Encourage continuation of current diet and exercise regimen. -Schedule follow-up in 1 year.             Signed, Donato Schultz, MD

## 2023-05-14 NOTE — Patient Instructions (Signed)
Medication Instructions:  Continue taking Nexlizet - a prescription has been sent in for you  *If you need a refill on your cardiac medications before your next appointment, please call your pharmacy*  Follow-Up: At [facility], you and your health needs are our priority.  As part of our continuing mission to provide you with exceptional heart care, we have created designated Provider Care Teams.  These Care Teams include your primary Cardiologist (physician) and Advanced Practice Providers (APPs -  Physician Assistants and Nurse Practitioners) who all work together to provide you with the care you need, when you need it.  Your next appointment:   1 year  Provider:   Donato Schultz, MD

## 2023-05-15 ENCOUNTER — Telehealth: Payer: Self-pay

## 2023-05-15 ENCOUNTER — Other Ambulatory Visit (HOSPITAL_COMMUNITY): Payer: Self-pay

## 2023-05-15 NOTE — Telephone Encounter (Addendum)
Pharmacy Patient Advocate Encounter   Received notification from [facility] that prior authorization for NEXLIZET  is required/requested.   Insurance verification completed.   The patient is insured through [facility] .   Per test claim: PA required; PA submitted to [facility] via CoverMyMeds Key/confirmation #/EOC [id number]  Status is pending

## 2023-05-20 ENCOUNTER — Other Ambulatory Visit (HOSPITAL_COMMUNITY): Payer: Self-pay

## 2023-05-20 NOTE — Telephone Encounter (Signed)
Pharmacy Patient Advocate Encounter  Received notification from [facility] that Prior Authorization for NEXLIZET [id number]  has been APPROVED from [facility] 12.31.24. Ran test claim, Copay is $159.48 FOR A SUPPLY.   This test claim was processed through [facility]- copay amounts may vary at other pharmacies due to pharmacy/plan contracts, or as the patient moves through the different stages of their insurance plan.

## 2023-08-25 ENCOUNTER — Inpatient Hospital Stay: Payer: Medicare Other | Attending: Hematology

## 2023-08-25 DIAGNOSIS — Z86711 Personal history of pulmonary embolism: Secondary | ICD-10-CM | POA: Insufficient documentation

## 2023-08-25 DIAGNOSIS — I2699 Other pulmonary embolism without acute cor pulmonale: Secondary | ICD-10-CM

## 2023-08-25 LAB — COMPREHENSIVE METABOLIC PANEL
ALT: 9 U/L (ref 0–44)
AST: 19 U/L (ref 15–41)
Albumin: 4 g/dL (ref 3.5–5.0)
Alkaline Phosphatase: 56 U/L (ref 38–126)
Anion gap: 5 (ref 5–15)
BUN: 14 mg/dL (ref 8–23)
CO2: 26 mmol/L (ref 22–32)
Calcium: 8.9 mg/dL (ref 8.9–10.3)
Chloride: 104 mmol/L (ref 98–111)
Creatinine, Ser: 1.03 mg/dL (ref 0.61–1.24)
GFR, Estimated: >60 mL/min (ref >60)
Glucose, Bld: 96 mg/dL (ref 70–99)
Potassium: 3.8 mmol/L (ref 3.5–5.1)
Sodium: 135 mmol/L (ref 135–145)
Total Bilirubin: 0.8 mg/dL (ref <1.2)
Total Protein: 6.2 g/dL — ABNORMAL LOW (ref 6.5–8.1)

## 2023-08-25 LAB — CBC WITH DIFFERENTIAL/PLATELET
Abs Immature Granulocytes: 0.01 10*3/uL (ref 0.00–0.07)
Basophils Absolute: 0 10*3/uL (ref 0.0–0.1)
Basophils Relative: 0 %
Eosinophils Absolute: 0.2 10*3/uL (ref 0.0–0.5)
Eosinophils Relative: 3 %
HCT: 42.1 % (ref 39.0–52.0)
Hemoglobin: 13.8 g/dL (ref 13.0–17.0)
Immature Granulocytes: 0 %
Lymphocytes Relative: 25 %
Lymphs Abs: 1.5 10*3/uL (ref 0.7–4.0)
MCH: 31.2 pg (ref 26.0–34.0)
MCHC: 32.8 g/dL (ref 30.0–36.0)
MCV: 95 fL (ref 80.0–100.0)
Monocytes Absolute: 0.5 10*3/uL (ref 0.1–1.0)
Monocytes Relative: 9 %
Neutro Abs: 3.7 10*3/uL (ref 1.7–7.7)
Neutrophils Relative %: 63 %
Platelets: 146 10*3/uL — ABNORMAL LOW (ref 150–400)
RBC: 4.43 MIL/uL (ref 4.22–5.81)
RDW: 13.5 % (ref 11.5–15.5)
WBC: 5.9 10*3/uL (ref 4.0–10.5)
nRBC: 0 % (ref 0.0–0.2)

## 2023-08-25 LAB — D-DIMER, QUANTITATIVE: D-Dimer, Quant: 0.39 ug{FEU}/mL (ref 0.00–0.50)

## 2023-08-29 NOTE — Progress Notes (Unsigned)
VIRTUAL VISIT via TELEPHONE NOTE [facility]   I connected with Jesse Coleman  on 09/01/23 at 8:30 AM by telephone and verified that I am speaking with the correct person using two identifiers.  Location: Patient: Home Provider: [facility]   I discussed the limitations, risks, security and privacy concerns of performing an evaluation and management service by telephone and the availability of in person appointments. I also discussed with the patient that there may be a patient responsible charge related to this service. The patient expressed understanding and agreed to proceed.  REASON FOR VISIT:  Follow-up for history of unprovoked pulmonary embolism   CURRENT THERAPY: Eliquis  INTERVAL HISTORY:  Jesse Coleman is contacted today for follow-up of unprovoked pulmonary embolism.  He was last seen by Rojelio Brenner PA-C on 08/21/2022.  At today's visit, he reports feeling well.  No recent hospitalizations, surgeries, or changes in baseline health status.   He has not had any recurrent DVT or PE in the year since his last visit.   No unilateral leg swelling, pain, and erythema.  He denies any shortness of breath, dyspnea on exertion, chest pain, cough, hemoptysis, and palpitations.  He takes Eliquis as prescribed without any missed doses. He denies any rectal bleeding or melena.  He has 75% energy and 100% appetite.   REVIEW OF SYSTEMS:   Review of Systems  Constitutional:  Negative for chills, diaphoresis, fever, malaise/fatigue and weight loss.  Respiratory:  Negative for cough and shortness of breath.   Cardiovascular:  Negative for chest pain and palpitations.  Gastrointestinal:  Negative for abdominal pain, blood in stool, melena, nausea and vomiting.  Neurological:  Negative for dizziness and headaches.     PHYSICAL EXAM: (per limitations of virtual telephone visit)  The patient is alert and oriented x 3, exhibiting adequate mentation, good  mood, and ability to speak in full sentences and execute sound judgement.  ASSESSMENT & PLAN:  1.  Unprovoked pulmonary embolism: - Referred by Dr. Isaiah Serge for unprovoked pulmonary embolism. - CT angiogram on 06/10/2019-acute bilateral extensive pulmonary thrombus, no saddle thrombus, suspicion for right heart strain.  No pulmonary infarct/effusion.  Questionable thrombus in the IVC and right inferior pulmonary vein. - 2D echo 10/18/2018 with EF 55-60%.  Right ventricle moderately reduced systolic function.  Cavity moderately enlarged. - Hypercoagulable work-up including factor V Leiden, prothrombin gene mutation, Antithrombin III, lupus anticoagulant, anticardiolipin antibody and beta-2 glycoprotein 1 antibody were negative. - His estranged son also has DVT and some abnormality with protein S. - JAK2 V617F testing was negative. - Colonoscopy in 2007 in [place] was within normal limits.  He is a non-smoker. - Anaphylactic reaction in July 2020 (approximately 8 weeks prior to PE) lasting for 4 days with swelling of his abdomen and lower extremities. - He is continuing to tolerate Eliquis without any major problems.  No bleeding issues.   - D-dimer (08/25/2023) normal at 0.39.  Normal hemoglobin 13.8, normal kidney function with creatinine 1.03/GFR >60. - No recurrent blood clots since being placed on Eliquis in September 2020. - PLAN: Labs (CBC/D, CMP, D-dimer) in 1 year.  OFFICE visit after. - Offered to release patient to PCP care for history of DVT, but he would prefer to follow annually with hematology clinic with alternating phone and office visits.   PLAN SUMMARY: >> Same-day labs (CBC/D, CMP, D-dimer) + OFFICE visit in 1 year      I discussed the assessment and treatment plan with  the patient. The patient was provided an opportunity to ask questions and all were answered. The patient agreed with the plan and demonstrated an understanding of the instructions.   The patient was advised to  call back or seek an in-person evaluation if the symptoms worsen or if the condition fails to improve as anticipated.  I provided 15 minutes of non-face-to-face time during this encounter.  Carnella Guadalajara, PA-C 09/01/23 8:44 AM

## 2023-09-01 ENCOUNTER — Encounter: Payer: Self-pay | Admitting: Physician Assistant

## 2023-09-01 ENCOUNTER — Inpatient Hospital Stay: Payer: Medicare Other | Attending: Physician Assistant | Admitting: Physician Assistant

## 2023-09-01 VITALS — Wt 195.0 lb

## 2023-09-01 DIAGNOSIS — I2699 Other pulmonary embolism without acute cor pulmonale: Secondary | ICD-10-CM | POA: Diagnosis not present

## 2023-09-01 DIAGNOSIS — Z7901 Long term (current) use of anticoagulants: Secondary | ICD-10-CM

## 2023-09-18 ENCOUNTER — Other Ambulatory Visit: Payer: Self-pay

## 2023-09-18 ENCOUNTER — Inpatient Hospital Stay (HOSPITAL_COMMUNITY): Payer: Medicare Other

## 2023-09-18 ENCOUNTER — Inpatient Hospital Stay (HOSPITAL_COMMUNITY)
Admission: EM | Admit: 2023-09-18 | Discharge: 2023-09-30 | DRG: 232 | Disposition: A | Discharge status: Home / Self Care | Payer: Medicare Other | Attending: Thoracic Surgery (Cardiothoracic Vascular Surgery) | Admitting: Thoracic Surgery (Cardiothoracic Vascular Surgery)

## 2023-09-18 ENCOUNTER — Encounter (HOSPITAL_COMMUNITY): Payer: Self-pay | Admitting: *Deleted

## 2023-09-18 ENCOUNTER — Emergency Department (HOSPITAL_COMMUNITY): Payer: Medicare Other

## 2023-09-18 DIAGNOSIS — I493 Ventricular premature depolarization: Secondary | ICD-10-CM | POA: Diagnosis not present

## 2023-09-18 DIAGNOSIS — I5032 Chronic diastolic (congestive) heart failure: Secondary | ICD-10-CM

## 2023-09-18 DIAGNOSIS — I2699 Other pulmonary embolism without acute cor pulmonale: Secondary | ICD-10-CM | POA: Diagnosis present

## 2023-09-18 DIAGNOSIS — N529 Male erectile dysfunction, unspecified: Secondary | ICD-10-CM | POA: Diagnosis present

## 2023-09-18 DIAGNOSIS — Y838 Other surgical procedures as the cause of abnormal reaction of the patient, or of later complication, without mention of misadventure at the time of the procedure: Secondary | ICD-10-CM | POA: Diagnosis not present

## 2023-09-18 DIAGNOSIS — H34231 Retinal artery branch occlusion, right eye: Secondary | ICD-10-CM | POA: Diagnosis not present

## 2023-09-18 DIAGNOSIS — I4819 Other persistent atrial fibrillation: Secondary | ICD-10-CM | POA: Diagnosis present

## 2023-09-18 DIAGNOSIS — E785 Hyperlipidemia, unspecified: Secondary | ICD-10-CM | POA: Diagnosis present

## 2023-09-18 DIAGNOSIS — D3500 Benign neoplasm of unspecified adrenal gland: Secondary | ICD-10-CM | POA: Diagnosis present

## 2023-09-18 DIAGNOSIS — I10 Essential (primary) hypertension: Secondary | ICD-10-CM | POA: Diagnosis present

## 2023-09-18 DIAGNOSIS — N400 Enlarged prostate without lower urinary tract symptoms: Secondary | ICD-10-CM | POA: Diagnosis present

## 2023-09-18 DIAGNOSIS — I2510 Atherosclerotic heart disease of native coronary artery without angina pectoris: Secondary | ICD-10-CM | POA: Diagnosis present

## 2023-09-18 DIAGNOSIS — D352 Benign neoplasm of pituitary gland: Secondary | ICD-10-CM | POA: Diagnosis present

## 2023-09-18 DIAGNOSIS — D6959 Other secondary thrombocytopenia: Secondary | ICD-10-CM | POA: Diagnosis not present

## 2023-09-18 DIAGNOSIS — R072 Precordial pain: Secondary | ICD-10-CM | POA: Diagnosis present

## 2023-09-18 DIAGNOSIS — I255 Ischemic cardiomyopathy: Secondary | ICD-10-CM | POA: Diagnosis present

## 2023-09-18 DIAGNOSIS — Z8249 Family history of ischemic heart disease and other diseases of the circulatory system: Secondary | ICD-10-CM | POA: Diagnosis not present

## 2023-09-18 DIAGNOSIS — R0609 Other forms of dyspnea: Secondary | ICD-10-CM | POA: Diagnosis not present

## 2023-09-18 DIAGNOSIS — F419 Anxiety disorder, unspecified: Secondary | ICD-10-CM | POA: Diagnosis present

## 2023-09-18 DIAGNOSIS — H5340 Unspecified visual field defects: Secondary | ICD-10-CM | POA: Diagnosis not present

## 2023-09-18 DIAGNOSIS — Z01810 Encounter for preprocedural cardiovascular examination: Secondary | ICD-10-CM | POA: Diagnosis not present

## 2023-09-18 DIAGNOSIS — I110 Hypertensive heart disease with heart failure: Secondary | ICD-10-CM | POA: Diagnosis present

## 2023-09-18 DIAGNOSIS — I214 Non-ST elevation (NSTEMI) myocardial infarction: Secondary | ICD-10-CM | POA: Diagnosis present

## 2023-09-18 DIAGNOSIS — Z1152 Encounter for screening for COVID-19: Secondary | ICD-10-CM | POA: Diagnosis not present

## 2023-09-18 DIAGNOSIS — I25119 Atherosclerotic heart disease of native coronary artery with unspecified angina pectoris: Secondary | ICD-10-CM | POA: Diagnosis not present

## 2023-09-18 DIAGNOSIS — I2782 Chronic pulmonary embolism: Secondary | ICD-10-CM | POA: Diagnosis not present

## 2023-09-18 DIAGNOSIS — T502X5A Adverse effect of carbonic-anhydrase inhibitors, benzothiadiazides and other diuretics, initial encounter: Secondary | ICD-10-CM | POA: Diagnosis not present

## 2023-09-18 DIAGNOSIS — E871 Hypo-osmolality and hyponatremia: Secondary | ICD-10-CM | POA: Diagnosis not present

## 2023-09-18 DIAGNOSIS — E7849 Other hyperlipidemia: Secondary | ICD-10-CM | POA: Diagnosis not present

## 2023-09-18 DIAGNOSIS — D62 Acute posthemorrhagic anemia: Secondary | ICD-10-CM | POA: Diagnosis not present

## 2023-09-18 DIAGNOSIS — I200 Unstable angina: Principal | ICD-10-CM

## 2023-09-18 DIAGNOSIS — S27892A Contusion of other specified intrathoracic organs, initial encounter: Secondary | ICD-10-CM | POA: Diagnosis not present

## 2023-09-18 DIAGNOSIS — I25110 Atherosclerotic heart disease of native coronary artery with unstable angina pectoris: Secondary | ICD-10-CM | POA: Diagnosis not present

## 2023-09-18 DIAGNOSIS — Z86711 Personal history of pulmonary embolism: Secondary | ICD-10-CM

## 2023-09-18 DIAGNOSIS — Z79899 Other long term (current) drug therapy: Secondary | ICD-10-CM | POA: Diagnosis not present

## 2023-09-18 DIAGNOSIS — Z951 Presence of aortocoronary bypass graft: Secondary | ICD-10-CM | POA: Diagnosis not present

## 2023-09-18 DIAGNOSIS — H5461 Unqualified visual loss, right eye, normal vision left eye: Secondary | ICD-10-CM | POA: Diagnosis not present

## 2023-09-18 DIAGNOSIS — Z7901 Long term (current) use of anticoagulants: Secondary | ICD-10-CM | POA: Diagnosis not present

## 2023-09-18 DIAGNOSIS — Z91040 Latex allergy status: Secondary | ICD-10-CM

## 2023-09-18 DIAGNOSIS — K5900 Constipation, unspecified: Secondary | ICD-10-CM | POA: Diagnosis not present

## 2023-09-18 DIAGNOSIS — R0989 Other specified symptoms and signs involving the circulatory and respiratory systems: Secondary | ICD-10-CM | POA: Diagnosis present

## 2023-09-18 DIAGNOSIS — I472 Ventricular tachycardia, unspecified: Secondary | ICD-10-CM | POA: Diagnosis not present

## 2023-09-18 DIAGNOSIS — I447 Left bundle-branch block, unspecified: Secondary | ICD-10-CM | POA: Diagnosis present

## 2023-09-18 DIAGNOSIS — J9811 Atelectasis: Secondary | ICD-10-CM | POA: Diagnosis not present

## 2023-09-18 DIAGNOSIS — H8109 Meniere's disease, unspecified ear: Secondary | ICD-10-CM | POA: Diagnosis present

## 2023-09-18 DIAGNOSIS — R011 Cardiac murmur, unspecified: Secondary | ICD-10-CM | POA: Diagnosis present

## 2023-09-18 LAB — CBC
HCT: 44.6 % (ref 39.0–52.0)
Hemoglobin: 14.8 g/dL (ref 13.0–17.0)
MCH: 30.8 pg (ref 26.0–34.0)
MCHC: 33.2 g/dL (ref 30.0–36.0)
MCV: 92.7 fL (ref 80.0–100.0)
Platelets: 152 10*3/uL (ref 150–400)
RBC: 4.81 MIL/uL (ref 4.22–5.81)
RDW: 13.4 % (ref 11.5–15.5)
WBC: 9.2 10*3/uL (ref 4.0–10.5)
nRBC: 0 % (ref 0.0–0.2)

## 2023-09-18 LAB — APTT: aPTT: 49 s — ABNORMAL HIGH (ref 24–36)

## 2023-09-18 LAB — TROPONIN I (HIGH SENSITIVITY)
Troponin I (High Sensitivity): 771 ng/L (ref <18)
Troponin I (High Sensitivity): 6408 ng/L (ref <18)

## 2023-09-18 LAB — BASIC METABOLIC PANEL
Anion gap: 9 (ref 5–15)
BUN: 13 mg/dL (ref 8–23)
CO2: 20 mmol/L — ABNORMAL LOW (ref 22–32)
Calcium: 9.6 mg/dL (ref 8.9–10.3)
Chloride: 107 mmol/L (ref 98–111)
Creatinine, Ser: 0.94 mg/dL (ref 0.61–1.24)
GFR, Estimated: >60 mL/min (ref >60)
Glucose, Bld: 88 mg/dL (ref 70–99)
Potassium: 3.7 mmol/L (ref 3.5–5.1)
Sodium: 136 mmol/L (ref 135–145)

## 2023-09-18 LAB — HEPARIN LEVEL (UNFRACTIONATED): Heparin Unfractionated: >1.1 [IU]/mL — ABNORMAL HIGH (ref 0.30–0.70)

## 2023-09-18 MED ORDER — ONDANSETRON HCL 4 MG/2ML IJ SOLN
4.0000 mg | Freq: Once | INTRAMUSCULAR | Status: AC
  Administered 2023-09-18: 4 mg via INTRAVENOUS
  Filled 2023-09-18: qty 2

## 2023-09-18 MED ORDER — NITROGLYCERIN 2 % TD OINT: 1.0000 [in_us] | TOPICAL_OINTMENT | Freq: Once | TRANSDERMAL | Status: DC

## 2023-09-18 MED ORDER — METOPROLOL TARTRATE 25 MG PO TABS
25.0000 mg | ORAL_TABLET | Freq: Four times a day (QID) | ORAL | Status: DC
Start: 2023-09-18 — End: 2023-09-19
  Administered 2023-09-18: 25 mg via ORAL
  Filled 2023-09-18: qty 1

## 2023-09-18 MED ORDER — HEPARIN BOLUS VIA INFUSION: 4000.0000 [IU] | Freq: Once | INTRAVENOUS | Status: DC

## 2023-09-18 MED ORDER — HEPARIN (PORCINE) 25000 UT/250ML-% IV SOLN: 14.0000 [IU]/kg/h | INTRAVENOUS | Status: DC

## 2023-09-18 MED ORDER — ACETAMINOPHEN 325 MG PO TABS
650.0000 mg | ORAL_TABLET | ORAL | Status: DC | PRN
  Administered 2023-09-19: 650 mg via ORAL
  Filled 2023-09-18: qty 2

## 2023-09-18 MED ORDER — SODIUM CHLORIDE 0.9 % IV SOLN: INTRAVENOUS | Status: DC

## 2023-09-18 MED ORDER — MORPHINE SULFATE (PF) 2 MG/ML IV SOLN: 2.0000 mg | Freq: Once | INTRAVENOUS | Status: DC

## 2023-09-18 MED ORDER — LORAZEPAM 0.5 MG PO TABS
0.5000 mg | ORAL_TABLET | Freq: Four times a day (QID) | ORAL | Status: DC | PRN
  Administered 2023-09-18 – 2023-09-21 (×2): 0.5 mg via ORAL
  Filled 2023-09-18 (×2): qty 1

## 2023-09-18 MED ORDER — ONDANSETRON HCL 4 MG/2ML IJ SOLN
4.0000 mg | Freq: Four times a day (QID) | INTRAMUSCULAR | Status: DC | PRN
  Administered 2023-09-19: 4 mg via INTRAVENOUS
  Filled 2023-09-18: qty 2

## 2023-09-18 MED ORDER — HEPARIN (PORCINE) 25000 UT/250ML-% IV SOLN
1100.0000 [IU]/h | INTRAVENOUS | Status: DC
  Administered 2023-09-18: 1250 [IU]/h via INTRAVENOUS
  Filled 2023-09-18: qty 250

## 2023-09-18 MED ORDER — FUROSEMIDE 10 MG/ML IJ SOLN
80.0000 mg | Freq: Once | INTRAMUSCULAR | Status: AC
  Administered 2023-09-18: 80 mg via INTRAVENOUS
  Filled 2023-09-18: qty 8

## 2023-09-18 MED ORDER — NITROGLYCERIN 0.4 MG SL SUBL
0.4000 mg | SUBLINGUAL_TABLET | SUBLINGUAL | Status: AC | PRN
Start: 2023-09-18 — End: ?

## 2023-09-18 MED ORDER — PANTOPRAZOLE SODIUM 40 MG PO TBEC
40.0000 mg | DELAYED_RELEASE_TABLET | Freq: Every day | ORAL | Status: DC
  Administered 2023-09-18 – 2023-09-22 (×5): 40 mg via ORAL
  Filled 2023-09-18 (×5): qty 1

## 2023-09-18 MED ORDER — NITROGLYCERIN IN D5W 200-5 MCG/ML-% IV SOLN
5.0000 ug/min | INTRAVENOUS | Status: DC
  Administered 2023-09-18: 5 ug/min via INTRAVENOUS
  Administered 2023-09-20 – 2023-09-21 (×2): 20 ug/min via INTRAVENOUS
  Filled 2023-09-18 (×3): qty 250

## 2023-09-18 MED ORDER — ASPIRIN 81 MG PO TBEC
81.0000 mg | DELAYED_RELEASE_TABLET | Freq: Every day | ORAL | Status: DC
  Administered 2023-09-20 – 2023-09-22 (×3): 81 mg via ORAL
  Filled 2023-09-18 (×3): qty 1

## 2023-09-18 MED ORDER — ASPIRIN 325 MG PO TBEC
325.0000 mg | DELAYED_RELEASE_TABLET | Freq: Every day | ORAL | Status: DC
  Administered 2023-09-18: 325 mg via ORAL
  Filled 2023-09-18: qty 1

## 2023-09-18 NOTE — Consult Note (Signed)
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 He continued to have waxing and waning of nonradiating chest pain in the middle of his chest and had to take 4-5 SL NTG until he arrived to the ER.  He continues to have 1/10 chest pain during my interview.  Appears to be comfortable.  EKG showed NSR, LBBB (chronic). Hs troponins 771.  Patient reported ongoing accelerating angina in the last few months, occurring almost daily and with exertional activities like climbing 1-2 flights of stairs, blowing leaves, mowing the lawn, brisk walking etc. Currently on antianginal therapy, Imdur 30 mg twice daily only.  Unclear why he is not on Ranexa.  LHC in 2014 was performed due to symptoms of exertional angina and positive stress echo findings of LVEF 40%.  LHC showed mild to moderate CAD of the epicardial coronaries however he had high-grade disease in the very small branches of the epicardial coronaries which were amenable to PCI. RCA is nondominant, has proximal severe disease.  LAD is a large-caliber vessel that has proximal to mid severe calcification (appears to be nonflow limiting) and distal LAD becomes smaller in caliber to first and.  There are 3 diagonal vessels that had high-grade stenosis.  LCx is a large-caliber vessel with 20% proximal stenosis.  There are 3 obtuse marginal vessels.  OM1 and OM 2 has  severe/high-grade stenosis.  OM 3 has moderate stenosis.  On Eliquis for unprovoked PE.  Last dose of Eliquis is this morning.  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   Medical therapy-1999    Past Surgical History:  Procedure Laterality Date   CARDIAC CATHETERIZATION  2014   LAD 40%, D1&D2 70-80% (1.7mm), CFX 20%, OM1 & OM2 90% (1.O mm), OM3 50%, RCA 90% (1.5-1.75 mm), EF 40-45%, med rx   COLONOSCOPY  2008   [place], [place]   Dental implant  2007   HERNIA REPAIR  2010   [place], [place]   KNEE SURGERY     REFRACTIVE SURGERY Right 2003   VASECTOMY  2003    Inpatient Medications: Scheduled Meds:  heparin  4,000 Units Intravenous Once   ondansetron  4 mg Intravenous Once   Continuous Infusions:  heparin     nitroGLYCERIN     PRN Meds:   Allergies:    Allergies  Allergen Reactions   Amoxicillin-Pot Clavulanate Nausea And Vomiting   Atorvastatin Other (See Comments)   Erythromycin Nausea And Vomiting   Latex Swelling and Other (See Comments)    Lips swell, but no breathing issues   Rosuvastatin Other (See Comments)    Social History:   Social History   Socioeconomic History   Marital status: Married  Spouse name: Not on file   Number of children: 2   Years of education: Not on file   Highest education level: Not on file  Occupational History   Occupation: Real estate sales    Employer: [employer]   Tobacco Use   Smoking status: Never   Smokeless tobacco: Never  Substance and Sexual Activity   Alcohol use: Yes    Alcohol/week: 1.0 standard drink of alcohol    Types: 1 drink(s) per week   Drug use: No   Sexual activity: Not on file  Other Topics Concern   Not on file  Social History Narrative   Not on file   Social Drivers of Health    Financial Resource Strain: Low Risk  (08/08/2020)   Overall Financial Resource Strain (CARDIA)    Difficulty of Paying Living Expenses: Not hard at all  Food Insecurity: No Food Insecurity (08/08/2020)   Hunger Vital Sign    Worried About Running Out of Food in the Last Year: Never true    Ran Out of Food in the Last Year: Never true  Transportation Needs: No Transportation Needs (08/08/2020)   PRAPARE - Administrator, Civil Service (Medical): No    Lack of Transportation (Non-Medical): No  Physical Activity: Insufficiently Active (08/08/2020)   Exercise Vital Sign    Days of Exercise per Week: 1 day    Minutes of Exercise per Session: 40 min  Stress: No Stress Concern Present (08/08/2020)   [employer] of Occupational Health - Occupational Stress Questionnaire    Feeling of Stress : Not at all  Social Connections: Moderately Integrated (08/08/2020)   Social Connection and Isolation Panel [NHANES]    Frequency of Communication with Friends and Family: More than three times a week    Frequency of Social Gatherings with Friends and Family: Once a week    Attends Religious Services: More than 4 times per year    Active Member of [employer] or Organizations: No    Attends Banker Meetings: Never    Marital Status: Married  Catering manager Violence: Not At Risk (08/08/2020)   Humiliation, Afraid, Rape, and Kick questionnaire    Fear of Current or Ex-Partner: No    Emotionally Abused: No    Physically Abused: No    Sexually Abused: No    Family History:    Family History  Problem Relation Age of Onset   Coronary artery disease Father 30       sudden cardiac death   Emphysema Mother        smoked     ROS:  Please see the history of present illness.  ROS  All other ROS reviewed and negative.     Physical Exam/Data:   Vitals:   09/18/23 1440 09/18/23 1459 09/18/23 1515 09/18/23 1530  BP:   (!) 151/96 (!) 141/85  Pulse: 98  89 78  Resp: 20  17 15    Temp: 98.1 F (36.7 C)     TempSrc: Oral     SpO2: 98%  93% 97%  Weight:  89.4 kg    Height:  5\' 10"  (1.778 m)     No intake or output data in the 24 hours ending 09/18/23 1635 Filed Weights   09/18/23 1459  Weight: 89.4 kg   Body mass index is 28.27 kg/m.  General:  Well nourished, well developed, in no acute distress HEENT: normal Lymph: no adenopathy Neck: no JVD Endocrine:  No thryomegaly Vascular: No  carotid bruits; FA pulses 2+ bilaterally without bruits  Cardiac:  normal S1, S2; RRR; no murmur  Lungs:  clear to auscultation bilaterally, no wheezing, rhonchi or rales  Abd: soft, nontender, no hepatomegaly  Ext: no edema Musculoskeletal:  No deformities, BUE and BLE strength normal and equal Skin: warm and dry  Neuro:  CNs 2-12 intact, no focal abnormalities noted Psych:  Normal affect   EKG:  The EKG was personally reviewed and demonstrates: NSR, LBBB (old) Telemetry:  Telemetry was personally reviewed and demonstrates: No alarms.   Laboratory Data:  Chemistry Recent Labs  Lab 09/18/23 1512  NA 136  K 3.7  CL 107  CO2 20*  GLUCOSE 88  BUN 13  CREATININE 0.94  CALCIUM 9.6  GFRNONAA >60  ANIONGAP 9    No results for input(s): "PROT", "ALBUMIN", "AST", "ALT", "ALKPHOS", "BILITOT" in the last 168 hours. Hematology Recent Labs  Lab 09/18/23 1512  WBC 9.2  RBC 4.81  HGB 14.8  HCT 44.6  MCV 92.7  MCH 30.8  MCHC 33.2  RDW 13.4  PLT 152   Cardiac EnzymesNo results for input(s): "TROPONINI" in the last 168 hours. No results for input(s): "TROPIPOC" in the last 168 hours.  BNPNo results for input(s): "BNP", "PROBNP" in the last 168 hours.  DDimer No results for input(s): "DDIMER" in the last 168 hours.  Radiology/Studies:  No results found.  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 He was having accelerating angina in the last few months with daily chest pains occurring with ordinary physical activity. EKG showed NSR, LBBB (old) and no ischemia. Hs troponin 771 x 1. Start ACS protocol and NTG drip (titrate till chest pain-free). Last dose of Eliquis is this morning, hold Eliquis. He underwent LHC in 2014 that showed mild CAD in the epicardial coronaries and high-grade stenosis in the very small caliber branch vessels which were unamenable to PCI. He will benefit from invasive ischemia evaluation with LHC to rule out severe CAD in the epicardial coronaries. Obtain 2D echocardiogram, lipid panel. Admit to hospitalist team and transfer to [facility] for [facility] tomorrow afternoon.  Informed consent for LHC Risks and benefits of cardiac catheterization have been discussed with the patient.  These include bleeding, infection, kidney damage, stroke, heart attack, death.  The patient understands these risks and is willing to proceed.   For questions or updates, please contact [facility] Please consult [email or web address] for contact info under Cardiology/STEMI.   Signed, Herbert Deaner, MD 09/18/2023 4:35 PM

## 2023-09-18 NOTE — ED Notes (Signed)
Pt placed on 10L HFNC

## 2023-09-18 NOTE — ED Notes (Signed)
Sats remain in high 80's, O2 bumped to [facility]

## 2023-09-18 NOTE — ED Notes (Signed)
[facility] called for transport at this time. 

## 2023-09-18 NOTE — H&P (View-Only) (Signed)
 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 He continued to have waxing and waning of nonradiating chest pain in the middle of his chest and had to take 4-5 SL NTG until he arrived to the ER.  He continues to have 1/10 chest pain during my interview.  Appears to be comfortable.  EKG showed NSR, LBBB (chronic). Hs troponins 771.  Patient reported ongoing accelerating angina in the last few months, occurring almost daily and with exertional activities like climbing 1-2 flights of stairs, blowing leaves, mowing the lawn, brisk walking etc. Currently on antianginal therapy, Imdur 30 mg twice daily only.  Unclear why he is not on Ranexa.  LHC in 2014 was performed due to symptoms of exertional angina and positive stress echo findings of LVEF 40%.  LHC showed mild to moderate CAD of the epicardial coronaries however he had high-grade disease in the very small branches of the epicardial coronaries which were amenable to PCI. RCA is nondominant, has proximal severe disease.  LAD is a large-caliber vessel that has proximal to mid severe calcification (appears to be nonflow limiting) and distal LAD becomes smaller in caliber to first and.  There are 3 diagonal vessels that had high-grade stenosis.  LCx is a large-caliber vessel with 20% proximal stenosis.  There are 3 obtuse marginal vessels.  OM1 and OM 2 has  severe/high-grade stenosis.  OM 3 has moderate stenosis.  On Eliquis for unprovoked PE.  Last dose of Eliquis is this morning.  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   Medical therapy-1999    Past Surgical History:  Procedure Laterality Date   CARDIAC CATHETERIZATION  2014   LAD 40%, D1&D2 70-80% (1.7mm), CFX 20%, OM1 & OM2 90% (1.O mm), OM3 50%, RCA 90% (1.5-1.75 mm), EF 40-45%, med rx   COLONOSCOPY  2008   [place], [place]   Dental implant  2007   HERNIA REPAIR  2010   [place], [place]   KNEE SURGERY     REFRACTIVE SURGERY Right 2003   VASECTOMY  2003    Inpatient Medications: Scheduled Meds:  heparin  4,000 Units Intravenous Once   ondansetron  4 mg Intravenous Once   Continuous Infusions:  heparin     nitroGLYCERIN     PRN Meds:   Allergies:    Allergies  Allergen Reactions   Amoxicillin-Pot Clavulanate Nausea And Vomiting   Atorvastatin Other (See Comments)   Erythromycin Nausea And Vomiting   Latex Swelling and Other (See Comments)    Lips swell, but no breathing issues   Rosuvastatin Other (See Comments)    Social History:   Social History   Socioeconomic History   Marital status: Married  Spouse name: Not on file   Number of children: 2   Years of education: Not on file   Highest education level: Not on file  Occupational History   Occupation: Real estate sales    Employer: [employer]   Tobacco Use   Smoking status: Never   Smokeless tobacco: Never  Substance and Sexual Activity   Alcohol use: Yes    Alcohol/week: 1.0 standard drink of alcohol    Types: 1 drink(s) per week   Drug use: No   Sexual activity: Not on file  Other Topics Concern   Not on file  Social History Narrative   Not on file   Social Drivers of Health    Financial Resource Strain: Low Risk  (08/08/2020)   Overall Financial Resource Strain (CARDIA)    Difficulty of Paying Living Expenses: Not hard at all  Food Insecurity: No Food Insecurity (08/08/2020)   Hunger Vital Sign    Worried About Running Out of Food in the Last Year: Never true    Ran Out of Food in the Last Year: Never true  Transportation Needs: No Transportation Needs (08/08/2020)   PRAPARE - Administrator, Civil Service (Medical): No    Lack of Transportation (Non-Medical): No  Physical Activity: Insufficiently Active (08/08/2020)   Exercise Vital Sign    Days of Exercise per Week: 1 day    Minutes of Exercise per Session: 40 min  Stress: No Stress Concern Present (08/08/2020)   [employer] of Occupational Health - Occupational Stress Questionnaire    Feeling of Stress : Not at all  Social Connections: Moderately Integrated (08/08/2020)   Social Connection and Isolation Panel [NHANES]    Frequency of Communication with Friends and Family: More than three times a week    Frequency of Social Gatherings with Friends and Family: Once a week    Attends Religious Services: More than 4 times per year    Active Member of [employer] or Organizations: No    Attends Banker Meetings: Never    Marital Status: Married  Catering manager Violence: Not At Risk (08/08/2020)   Humiliation, Afraid, Rape, and Kick questionnaire    Fear of Current or Ex-Partner: No    Emotionally Abused: No    Physically Abused: No    Sexually Abused: No    Family History:    Family History  Problem Relation Age of Onset   Coronary artery disease Father 30       sudden cardiac death   Emphysema Mother        smoked     ROS:  Please see the history of present illness.  ROS  All other ROS reviewed and negative.     Physical Exam/Data:   Vitals:   09/18/23 1440 09/18/23 1459 09/18/23 1515 09/18/23 1530  BP:   (!) 151/96 (!) 141/85  Pulse: 98  89 78  Resp: 20  17 15    Temp: 98.1 F (36.7 C)     TempSrc: Oral     SpO2: 98%  93% 97%  Weight:  89.4 kg    Height:  5\' 10"  (1.778 m)     No intake or output data in the 24 hours ending 09/18/23 1635 Filed Weights   09/18/23 1459  Weight: 89.4 kg   Body mass index is 28.27 kg/m.  General:  Well nourished, well developed, in no acute distress HEENT: normal Lymph: no adenopathy Neck: no JVD Endocrine:  No thryomegaly Vascular: No  carotid bruits; FA pulses 2+ bilaterally without bruits  Cardiac:  normal S1, S2; RRR; no murmur  Lungs:  clear to auscultation bilaterally, no wheezing, rhonchi or rales  Abd: soft, nontender, no hepatomegaly  Ext: no edema Musculoskeletal:  No deformities, BUE and BLE strength normal and equal Skin: warm and dry  Neuro:  CNs 2-12 intact, no focal abnormalities noted Psych:  Normal affect   EKG:  The EKG was personally reviewed and demonstrates: NSR, LBBB (old) Telemetry:  Telemetry was personally reviewed and demonstrates: No alarms.   Laboratory Data:  Chemistry Recent Labs  Lab 09/18/23 1512  NA 136  K 3.7  CL 107  CO2 20*  GLUCOSE 88  BUN 13  CREATININE 0.94  CALCIUM 9.6  GFRNONAA >60  ANIONGAP 9    No results for input(s): "PROT", "ALBUMIN", "AST", "ALT", "ALKPHOS", "BILITOT" in the last 168 hours. Hematology Recent Labs  Lab 09/18/23 1512  WBC 9.2  RBC 4.81  HGB 14.8  HCT 44.6  MCV 92.7  MCH 30.8  MCHC 33.2  RDW 13.4  PLT 152   Cardiac EnzymesNo results for input(s): "TROPONINI" in the last 168 hours. No results for input(s): "TROPIPOC" in the last 168 hours.  BNPNo results for input(s): "BNP", "PROBNP" in the last 168 hours.  DDimer No results for input(s): "DDIMER" in the last 168 hours.  Radiology/Studies:  No results found.  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 He was having accelerating angina in the last few months with daily chest pains occurring with ordinary physical activity. EKG showed NSR, LBBB (old) and no ischemia. Hs troponin 771 x 1. Start ACS protocol and NTG drip (titrate till chest pain-free). Last dose of Eliquis is this morning, hold Eliquis. He underwent LHC in 2014 that showed mild CAD in the epicardial coronaries and high-grade stenosis in the very small caliber branch vessels which were unamenable to PCI. He will benefit from invasive ischemia evaluation with LHC to rule out severe CAD in the epicardial coronaries. Obtain 2D echocardiogram, lipid panel. Admit to hospitalist team and transfer to [facility] for [facility] tomorrow afternoon.  Informed consent for LHC Risks and benefits of cardiac catheterization have been discussed with the patient.  These include bleeding, infection, kidney damage, stroke, heart attack, death.  The patient understands these risks and is willing to proceed.   For questions or updates, please contact [facility] Please consult [email or web address] for contact info under Cardiology/STEMI.   Signed, Herbert Deaner, MD 09/18/2023 4:35 PM

## 2023-09-18 NOTE — ED Notes (Signed)
Pts last dose of eliquis was 0800 this morning, MD amd pharmacist made aware

## 2023-09-18 NOTE — ED Notes (Signed)
Patient transported to X-ray 

## 2023-09-18 NOTE — ED Notes (Signed)
Pt yelling loudly stating "I told the EMS I wanted to go to [facility] originally and I took their advice and came here instead and then come to find out yall can't do what yall need to for me here and I have to be transferred to [facility] anyways, get someone in here right fucking now and I want them to transport me to [facility] now where I orginally requested to go anyways" MD made aware and MD at bedside talking with pt

## 2023-09-18 NOTE — ED Notes (Signed)
Pts HR 120 after episode of yelling pertaining to [facility] and EMS situation, HR has been elevated since--MD made aware

## 2023-09-18 NOTE — ED Notes (Addendum)
Pt reporting 4/10 central cp. Called lab to confirm 2nd Trop has been drawn. Currently processing per lab

## 2023-09-18 NOTE — Progress Notes (Addendum)
Appears to be in cardiogenic pulmonary edema, he did develop dyspnea, new oxygen requirement over last few hours, he was on room air on presentation, but progressive hypoxia, currently on 10 L high flow nasal cannula, chest x-ray with evidence of interstitial edema, he received 80 mg of IV Lasix, so far 1100 cc of urine output since Lasix has been given but he continues to require increased oxygen requirement, discussed with the ED physician, he will be transfer from ED to ED for further evaluation by cardiology and PCCM at [facility] due to developing hemodynamic instability in the setting of NSTEMI.  Have discussed with PCCM and cardiology to evaluate when he gets to [facility]. Huey Bienenstock MD

## 2023-09-18 NOTE — ED Notes (Signed)
Dr. Randol Kern notified of pt's hypoxia and need for 4LNC O2, along with recently resulted Troponin. Repeat cxr ordered at this time

## 2023-09-18 NOTE — ED Notes (Signed)
Went into pt's room to introduce self to pt and inform "I'll be their tech tonight." Pt began to curse and express the only need he has is that he needs a call light that works. I then grabbed call light, pushed button for nursing staff and it proceeded to work. I then showed pt what I did and he told me to not "F...ing show him a red button and that he doesn't give a F...." He proceeded to yell and ask "Who the F...are you" and I again told pt my name and asked him not to speak like that. This proceeded to escalate pt and RN came to room to assist with deescalating pt and I left room. Requested security presence.

## 2023-09-18 NOTE — ED Notes (Signed)
Pt's sats 86% on RA placed on 4LNC, improved to 93% on O2

## 2023-09-18 NOTE — Progress Notes (Signed)
PHARMACY - ANTICOAGULATION CONSULT NOTE  Pharmacy Consult for heparin Indication: chest pain/ACS  Allergies  Allergen Reactions   Amoxicillin-Pot Clavulanate Nausea And Vomiting   Atorvastatin Other (See Comments)   Erythromycin Nausea And Vomiting   Latex Swelling and Other (See Comments)    Lips swell, but no breathing issues   Rosuvastatin Other (See Comments)    Patient Measurements: Height: 5\' 10"  (177.8 cm) Weight: 89.4 kg (197 lb) IBW/kg (Calculated) : 73 HEPARIN DW (KG): 89.4   Vital Signs: Temp: 98.1 F (36.7 C) (12/19 1440) Temp Source: Oral (12/19 1440) BP: 141/85 (12/19 1530) Pulse Rate: 78 (12/19 1530)  Labs: Recent Labs    09/18/23 1512  HGB 14.8  HCT 44.6  PLT 152  CREATININE 0.94  TROPONINIHS 771*    Estimated Creatinine Clearance: 83.5 mL/min (by C-G formula based on SCr of 0.94 mg/dL).   Medical History: Past Medical History:  Diagnosis Date   CAD (coronary artery disease)    Erectile dysfunction    Hematospermia    Hyperlipidemia    Hypertension 2014   Overweight(278.02)    Prolactin-secreting pituitary adenoma [facility]) 1999   Medical therapy-1999    Medications:  See med rec  Assessment: Patient was blowing leaves and at about 1400 developed some bilateral anterior chest pain. He came to the ED and still has some discomfort. He is chronically anticoagulated on eliquis 5mg  po BID for previous PE. His last dose of eliquis was 09/18/23 at 0800. Will start heparin at 2000, no bolus. Baseline HL and APTT ordered. Plan to monitor with APTT levels until correlates with HL(due to DI with eliquis for anti-Xa level)   Goal of Therapy:  INR 2-3 aPTT 66-102 seconds Monitor platelets by anticoagulation protocol: Yes   Plan:  Start heparin infusion at 1250 units/hr Check APTT in ~6-8  hours and APTT and HL daily while on heparin Continue to monitor H&H and platelets  Elder Cyphers, BS Pharm D, BCPS Clinical Pharmacist 09/18/2023,4:39 PM

## 2023-09-18 NOTE — ED Notes (Signed)
2/10 central cp now

## 2023-09-18 NOTE — ED Provider Notes (Addendum)
[place] EMERGENCY DEPARTMENT AT [facility] Provider Note   CSN: [id number] Arrival date & time: 09/18/23  1417     History  Chief Complaint  Patient presents with   Chest Pain    Jesse Coleman is a 67 y.o. male.  Patient was blowing leaves.  And at about 1400 developed some bilateral anterior chest pain.  Took nitroglycerin.  Some slight improvement.  Pain came back.  Patient ended up taking a total of 5 sublingual nitroglycerin.  And still has some discomfort.  Patient followed by [facility] cardiology.  Known to have a nonstentable lesion from about 5 years ago.  Patient will occasionally get anginal pain.  But usually 1 sublingual nitroglycerin takes it away.  Patient is also on Eliquis for pulmonary embolus in 2020.  Patient also experiencing shortness of breath with no nausea no vomiting.  Currently just has some mild discomfort but still has some discomfort.  Not having any breathing issues.  Oxygen saturation 98% on room air.  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  Coronary artery disease hyperlipidemia.  Patient is never used tobacco products.       Home Medications Prior to Admission medications   Medication Sig Start Date End Date Taking? Authorizing Provider  alfuzosin (UROXATRAL) 10 MG 24 hr tablet Take 10 mg by mouth daily. 09/02/23  Yes [provider]  bromocriptine (PARLODEL) 2.5 MG tablet Take 1.25 mg by mouth at bedtime.  01/19/13   [provider]  buPROPion (WELLBUTRIN XL) 150 MG 24 hr tablet Take 150 mg by mouth at bedtime.     [provider]  ELIQUIS 5 MG TABS tablet TAKE 1 TABLET BY MOUTH TWICE A DAY. 01/31/22   Mannam, Praveen, MD  EPINEPHrine 0.3 mg/0.3 mL IJ SOAJ injection Inject 0.3 mLs (0.3 mg total) into the muscle as needed for anaphylaxis. 04/24/19   Lenox Ponds, MD  isosorbide mononitrate (IMDUR) 30 MG 24 hr tablet Take 30 mg by mouth in  the morning and at bedtime.    [provider]  LORazepam (ATIVAN) 1 MG tablet Take 1 mg by mouth every 8 (eight) hours as needed for anxiety. 01/19/13   [provider]  NITROSTAT 0.4 MG SL tablet Place 0.4 mg under the tongue every 5 (five) minutes as needed for chest pain. 01/19/13   [provider]  rosuvastatin (CRESTOR) 5 MG tablet Take 5 mg by mouth once a week. 04/11/22   [provider]  sildenafil (VIAGRA) 100 MG tablet TAKE 1 TABLET BY MOUTH 30CMINUTES PRIOR TO 1HR BEFORE RELATIONS. 04/20/19   [provider]      Allergies    Amoxicillin-pot clavulanate, Atorvastatin, Erythromycin, Latex, and Rosuvastatin    Review of Systems   Review of Systems  Constitutional:  Negative for chills and fever.  HENT:  Negative for ear pain and sore throat.   Eyes:  Negative for pain and visual disturbance.  Respiratory:  Positive for shortness of breath. Negative for cough.   Cardiovascular:  Positive for chest pain. Negative for palpitations.  Gastrointestinal:  Negative for abdominal pain and vomiting.  Genitourinary:  Negative for dysuria and hematuria.  Musculoskeletal:  Negative for arthralgias and back pain.  Skin:  Negative for color change and rash.  Neurological:  Negative for seizures and syncope.  All other systems reviewed and are negative.   Physical Exam Updated Vital Signs BP (!) 141/85   Pulse 78  Temp 98.1 F (36.7 C) (Oral)   Resp 15   Ht 1.778 m (5\' 10" )   Wt 89.4 kg   SpO2 97%   BMI 28.27 kg/m  Physical Exam Vitals and nursing note reviewed.  Constitutional:      General: He is not in acute distress.    Appearance: Normal appearance. He is well-developed.  HENT:     Head: Normocephalic and atraumatic.     Mouth/Throat:     Mouth: Mucous membranes are moist.  Eyes:     Conjunctiva/sclera: Conjunctivae normal.  Cardiovascular:     Rate and Rhythm: Normal rate and regular rhythm.     Heart sounds: No murmur  heard. Pulmonary:     Effort: Pulmonary effort is normal. No respiratory distress.     Breath sounds: Normal breath sounds. No stridor. No wheezing, rhonchi or rales.  Abdominal:     Palpations: Abdomen is soft.     Tenderness: There is no abdominal tenderness.  Musculoskeletal:        General: No swelling.     Cervical back: Neck supple.     Right lower leg: No edema.     Left lower leg: No edema.  Skin:    General: Skin is warm and dry.     Capillary Refill: Capillary refill takes less than 2 seconds.  Neurological:     Mental Status: He is alert.  Psychiatric:        Mood and Affect: Mood normal.     ED Results / Procedures / Treatments   Labs (all labs ordered are listed, but only abnormal results are displayed) Labs Reviewed  BASIC METABOLIC PANEL - Abnormal; Notable for the following components:      Result Value   CO2 20 (*)    All other components within normal limits  TROPONIN I (HIGH SENSITIVITY) - Abnormal; Notable for the following components:   Troponin I (High Sensitivity) 771 (*)    All other components within normal limits  CBC    EKG EKG Interpretation Date/Time:  Thursday September 18 2023 14:35:38 EST Ventricular Rate:  91 PR Interval:  209 QRS Duration:  171 QT Interval:  387 QTC Calculation: 477 R Axis:   -8  Text Interpretation: Sinus rhythm Borderline prolonged PR interval Left bundle branch block Confirmed by Vanetta Mulders [phone]) on 09/18/2023 3:12:12 PM  Radiology No results found.  Procedures Procedures    Medications Ordered in ED Medications  morphine (PF) 2 MG/ML injection 2 mg (has no administration in time range)  ondansetron (ZOFRAN) injection 4 mg (has no administration in time range)  nitroGLYCERIN (NITROGLYN) 2 % ointment 1 inch (has no administration in time range)    ED Course/ Medical Decision Making/ A&P                                 Medical Decision Making Amount and/or Complexity of Data Reviewed Labs:  ordered. Radiology: ordered.  Risk Prescription drug management. Decision regarding hospitalization.   CRITICAL CARE Performed by: Vanetta Mulders Total critical care time: 60 minutes Critical care time was exclusive of separately billable procedures and treating other patients. Critical care was necessary to treat or prevent imminent or life-threatening deterioration. Critical care was time spent personally by me on the following activities: development of treatment plan with patient and/or surrogate as well as nursing, discussions with consultants, evaluation of patient's response to treatment, examination of patient, obtaining history  from patient or surrogate, ordering and performing treatments and interventions, ordering and review of laboratory studies, ordering and review of radiographic studies, pulse oximetry and re-evaluation of patient's condition.  Patient's history very concerning for unstable angina.  Patient's initial troponin did come back in the 700 range.  Patient has a longstanding history of left bundle branch block.  Maybe some subtle changes on his EKG.  This possibly could represent a non-STEMI as well.  Will discuss with cardiology here.  Suspect patient will need transfer to [facility] for admission.  Patient is taking his Eliquis will go ahead and put nitroglycerin paste currently on.  Will give low-dose morphine to help with the current pain.  Along with the nitroglycerin paste.  Chest x-ray results still pending.  But reviewing the chest x-ray myself I do not see anything significantly that is acute.  Patient last had Eliquis this morning.  Discussed with on-call cardiology.  Basic metabolic panel without any significant electrolyte abnormalities CBC white count 9.2 hemoglobin 14.8.  Patient's troponin is already mention was 771.  Cardiology wants to go ahead and start nitroglycerin drip and heparin bolus and heparin infusion.  They will come see patient.  Patient will  probably be going to [facility].  Patient seen by cardiology here.  Wants hospitalist to see patient here for hospitalist admission down to [facility].  Cardiology will follow.  Addendum: Contacted by hospitalist up here wanting patient sent ED to ED for his non-STEMI.  Starting to show some signs of pulmonary edema may be some early cardiogenic pulmonary edema.  Patient requiring high flow oxygen currently somewhat stable on that.  Patient was accepted for EGD transfer by Anders Simmonds.  Patient will go by [facility].  Final Clinical Impression(s) / ED Diagnoses Final diagnoses:  Unstable angina [facility])  NSTEMI (non-ST elevated myocardial infarction) [facility])    Rx / DC Orders ED Discharge Orders     None         Vanetta Mulders, MD 09/18/23 1609    Vanetta Mulders, MD 09/18/23 1623    Vanetta Mulders, MD 09/18/23 1635    Vanetta Mulders, MD 09/18/23 2232

## 2023-09-18 NOTE — ED Triage Notes (Signed)
Pt in from home via [facility] EMS, per report pt c/o mid non radiating substernal CP while blowing leaves today, pt took x 5 sL nitroglycerin pta without relief, no relief with resting, pain 8/10, pt denies SOB, n/v/d, diaphoresis, A&O x4, refused IV pta, pt BP 180/108 initially, pt rcvd 1 sL nitroglycerin via EMS with 147/86 post admin pain to 4/10 upon arrival to ED, ST with LBB

## 2023-09-18 NOTE — H&P (Addendum)
TRH H&P   Patient Demographics:    Jesse Coleman, is a 67 y.o. male  MRN: [id number]   DOB - 01-07-1956  Admit Date - 09/18/2023  Outpatient Primary MD for the patient is Adrian Prince, MD  Referring MD/NP/PA: Dr Deretha Emory  Outpatient Specialists: [facility] cardiology  Patient coming from: Home  Chief Complaint  Patient presents with   Chest Pain      HPI:    Jesse Coleman  is a 67 y.o. male, with past medical history of known CAD, ischemic cardiomyopathy, provoked PE, on Eliquis, hypertension, hyperlipidemia, adrenal adenoma on medical management. -Patient presents to ED secondary to complaints of chest pain, reports sudden onset, but it at 1 PM this afternoon, wax and wane, nonradiating, midsternal, much improved after 5 sublingual nitro, he had taken by the time he came to ED, currently chest pain 1 out of 10, he reports progressive chest pain-accelerating angina over the last few month, daily, initially exertional, been progressive, even at rest, he denies fever, chills, dyspnea, he does report minimal left lower extremity edema. -In ED his EKG with left bundle branch block which is his baseline, HS troponins significantly elevated at 771, creatinine stable at 0.94, platelet within normal range at 152K, seen by cardiology, with concern for NSTEMI, started on nitro drip, heparin drip ordered as well to be started at 8 PM ( his last Eliquis dose at 8 AM), [facility] hospitalist consulted to admit to [facility] as planned for cardiac cath in a.m.    Review of systems:     A full 10 point Review of Systems was done, except as stated above, all other Review of Systems were negative.   With Past History of the following :    Past Medical History:  Diagnosis Date   CAD (coronary artery disease)    Erectile dysfunction    Hematospermia    Hyperlipidemia    Hypertension  2014   Overweight(278.02)    Prolactin-secreting pituitary adenoma [facility]) 1999   Medical therapy-1999      Past Surgical History:  Procedure Laterality Date   CARDIAC CATHETERIZATION  2014   LAD 40%, D1&D2 70-80% (1.12mm), CFX 20%, OM1 & OM2 90% (1.O mm), OM3 50%, RCA 90% (1.5-1.75 mm), EF 40-45%, med rx   COLONOSCOPY  2008   [place], [place]   Dental implant  2007   HERNIA REPAIR  2010   [place], [place]   KNEE SURGERY     REFRACTIVE SURGERY Right 2003   VASECTOMY  2003      Social History:     Social History   Tobacco Use   Smoking status: Never   Smokeless tobacco: Never  Substance Use Topics   Alcohol use: Yes    Alcohol/week: 1.0 standard drink of alcohol    Types: 1 drink(s) per week       Family History :  Family History  Problem Relation Age of Onset   Coronary artery disease Father 30       sudden cardiac death   Emphysema Mother        smoked     Home Medications:   Prior to Admission medications   Medication Sig Start Date End Date Taking? Authorizing Provider  alfuzosin (UROXATRAL) 10 MG 24 hr tablet Take 10 mg by mouth daily. 09/02/23  Yes [provider]  bromocriptine (PARLODEL) 2.5 MG tablet Take 1.25 mg by mouth at bedtime.  01/19/13   [provider]  buPROPion (WELLBUTRIN XL) 150 MG 24 hr tablet Take 150 mg by mouth at bedtime.     [provider]  ELIQUIS 5 MG TABS tablet TAKE 1 TABLET BY MOUTH TWICE A DAY. 01/31/22   Mannam, Praveen, MD  EPINEPHrine 0.3 mg/0.3 mL IJ SOAJ injection Inject 0.3 mLs (0.3 mg total) into the muscle as needed for anaphylaxis. 04/24/19   Lenox Ponds, MD  isosorbide mononitrate (IMDUR) 30 MG 24 hr tablet Take 30 mg by mouth in the morning and at bedtime.    [provider]  LORazepam (ATIVAN) 1 MG tablet Take 1 mg by mouth every 8 (eight) hours as needed for anxiety. 01/19/13   [provider]  NITROSTAT 0.4 MG SL tablet Place 0.4 mg under the tongue every 5  (five) minutes as needed for chest pain. 01/19/13   [provider]  rosuvastatin (CRESTOR) 5 MG tablet Take 5 mg by mouth once a week. 04/11/22   [provider]  sildenafil (VIAGRA) 100 MG tablet TAKE 1 TABLET BY MOUTH 30CMINUTES PRIOR TO 1HR BEFORE RELATIONS. 04/20/19   [provider]     Allergies:     Allergies  Allergen Reactions   Amoxicillin-Pot Clavulanate Nausea And Vomiting   Atorvastatin Other (See Comments)   Erythromycin Nausea And Vomiting   Latex Swelling and Other (See Comments)    Lips swell, but no breathing issues   Rosuvastatin Other (See Comments)     Physical Exam:   Vitals  Blood pressure (!) 171/101, pulse (!) 112, temperature 98.1 F (36.7 C), temperature source Oral, resp. rate 20, height 5\' 10"  (1.778 m), weight 89.4 kg, SpO2 96%.   1. General Well-developed male, laying in bed, no apparent distress  2. Normal affect and insight, Not Suicidal or Homicidal, Awake Alert, Oriented X 3.  3. No F.N deficits, ALL C.Nerves Intact, Strength 5/5 all 4 extremities, Sensation intact all 4 extremities, Plantars down going.  4. Ears and Eyes appear Normal, Conjunctivae clear, PERRLA. Moist Oral Mucosa.  5. Supple Neck, No JVD, No cervical lymphadenopathy appriciated, No Carotid Bruits.  6. Symmetrical Chest wall movement, Good air movement bilaterally, CTAB.  7. RRR, No Gallops, Rubs or Murmurs, No Parasternal Heave.  8. Positive Bowel Sounds, Abdomen Soft, No tenderness, No organomegaly appriciated,No rebound -guarding or rigidity.  9.  No Cyanosis, Normal Skin Turgor, No Skin Rash or Bruise.  10. Good muscle tone,  joints appear normal , no effusions, Normal ROM.     Data Review:    CBC Recent Labs  Lab 09/18/23 1512  WBC 9.2  HGB 14.8  HCT 44.6  PLT 152  MCV 92.7  MCH 30.8  MCHC 33.2  RDW 13.4    ------------------------------------------------------------------------------------------------------------------  Chemistries  Recent Labs  Lab 09/18/23 1512  NA 136  K 3.7  CL 107  CO2 20*  GLUCOSE 88  BUN 13  CREATININE 0.94  CALCIUM  9.6   ------------------------------------------------------------------------------------------------------------------ estimated creatinine clearance is 83.5 mL/min (by C-G formula based on SCr of 0.94 mg/dL). ------------------------------------------------------------------------------------------------------------------ No results for input(s): "TSH", "T4TOTAL", "T3FREE", "THYROIDAB" in the last 72 hours.  Invalid input(s): "FREET3"  Coagulation profile No results for input(s): "INR", "PROTIME" in the last 168 hours. ------------------------------------------------------------------------------------------------------------------- No results for input(s): "DDIMER" in the last 72 hours. -------------------------------------------------------------------------------------------------------------------  Cardiac Enzymes No results for input(s): "CKMB", "TROPONINI", "MYOGLOBIN" in the last 168 hours.  Invalid input(s): "CK" ------------------------------------------------------------------------------------------------------------------    Component Value Date/Time   BNP 111.6 (H) 06/10/2019 1340     ---------------------------------------------------------------------------------------------------------------  Urinalysis No results found for: "COLORURINE", "APPEARANCEUR", "LABSPEC", "PHURINE", "GLUCOSEU", "HGBUR", "BILIRUBINUR", "KETONESUR", "PROTEINUR", "UROBILINOGEN", "NITRITE", "LEUKOCYTESUR"  ----------------------------------------------------------------------------------------------------------------   Imaging Results:    No results found.  EKG:  Vent. rate 91 BPM PR interval 209 ms QRS duration 171 ms QT/QTcB 387/477  ms P-R-T axes 52 -8 116 Sinus rhythm Borderline prolonged PR interval Left bundle branch block Confirmed by Deretha Emory, Shelley  Assessment & Plan:    Principal Problem:   NSTEMI (non-ST elevated myocardial infarction) (HCC) Active Problems:   Essential hypertension   Hyperlipidemia LDL goal <70   Coronary artery disease involving native coronary artery of native heart without angina pectoris   Ischemic cardiomyopathy   Pulmonary embolism (HCC)    NSTEMI -Presents with chest pain, elevated troponin at 771 -start ACS protocol per cardiology recommendation,  -started on nitro drip, will titrate till chest pain-free ( last Viagra was 2-3  weeks ago) -On heparin GTT, last dose Eliquis was at 8 AM, start heparin GTT accordingly. -Check lipid panel -dsicussed with cardiology, will start give full dose aspirin now -Management per cardiology, current recommendation nitro drip, heparin drip and full dose aspirin as discussed with cardiology -Admit to [facility] as planned for cardiac cath tomorrow per cardiology, will keep n.p.o. 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 Currently  Hypertension -With home beta-blockers -He is on nitro drip as well  Hyperlipidemia -Check lipid panel, continue with high-dose statin       DVT Prophylaxis on Eliquis at home>>Heparin GTT  AM Labs Ordered, also please review Full Orders  Family Communication: Admission, patients condition and plan of care including tests being ordered have been discussed with the patient and son at bedside who indicate understanding and agree with the plan and Code Status.  Code Status full  Likely DC to  home  Condition GUARDED     Consults called: cardiology    Admission status: inpatient    Time spent in minutes : 70 minutes   Huey Bienenstock M.D on 09/18/2023 at 5:24 PM   [facility] - Office  [phone]

## 2023-09-19 ENCOUNTER — Ambulatory Visit (HOSPITAL_COMMUNITY): Admit: 2023-09-19 | Payer: Medicare Other | Admitting: Cardiology

## 2023-09-19 ENCOUNTER — Other Ambulatory Visit (HOSPITAL_COMMUNITY): Payer: Self-pay

## 2023-09-19 ENCOUNTER — Encounter (HOSPITAL_COMMUNITY)
Admission: EM | Disposition: A | Discharge status: Home / Self Care | Payer: Self-pay | Source: Home / Self Care | Attending: Thoracic Surgery (Cardiothoracic Vascular Surgery)

## 2023-09-19 ENCOUNTER — Telehealth (HOSPITAL_COMMUNITY): Payer: Self-pay

## 2023-09-19 DIAGNOSIS — E785 Hyperlipidemia, unspecified: Secondary | ICD-10-CM | POA: Diagnosis not present

## 2023-09-19 DIAGNOSIS — I25110 Atherosclerotic heart disease of native coronary artery with unstable angina pectoris: Secondary | ICD-10-CM | POA: Diagnosis not present

## 2023-09-19 DIAGNOSIS — I214 Non-ST elevation (NSTEMI) myocardial infarction: Secondary | ICD-10-CM | POA: Diagnosis not present

## 2023-09-19 DIAGNOSIS — I2510 Atherosclerotic heart disease of native coronary artery without angina pectoris: Secondary | ICD-10-CM

## 2023-09-19 DIAGNOSIS — I2782 Chronic pulmonary embolism: Secondary | ICD-10-CM

## 2023-09-19 HISTORY — PX: LEFT HEART CATH AND CORONARY ANGIOGRAPHY: CATH118249

## 2023-09-19 LAB — HEMOGLOBIN A1C
Hgb A1c MFr Bld: 5.2 % (ref 4.8–5.6)
Mean Plasma Glucose: 102.54 mg/dL

## 2023-09-19 LAB — APTT: aPTT: 119 s — ABNORMAL HIGH (ref 24–36)

## 2023-09-19 LAB — BASIC METABOLIC PANEL
Anion gap: 12 (ref 5–15)
BUN: 10 mg/dL (ref 8–23)
CO2: 20 mmol/L — ABNORMAL LOW (ref 22–32)
Calcium: 9.6 mg/dL (ref 8.9–10.3)
Chloride: 107 mmol/L (ref 98–111)
Creatinine, Ser: 1.11 mg/dL (ref 0.61–1.24)
GFR, Estimated: >60 mL/min (ref >60)
Glucose, Bld: 110 mg/dL — ABNORMAL HIGH (ref 70–99)
Potassium: 3.8 mmol/L (ref 3.5–5.1)
Sodium: 139 mmol/L (ref 135–145)

## 2023-09-19 LAB — CBC
HCT: 51.1 % (ref 39.0–52.0)
Hemoglobin: 17 g/dL (ref 13.0–17.0)
MCH: 30.6 pg (ref 26.0–34.0)
MCHC: 33.3 g/dL (ref 30.0–36.0)
MCV: 91.9 fL (ref 80.0–100.0)
Platelets: 179 10*3/uL (ref 150–400)
RBC: 5.56 MIL/uL (ref 4.22–5.81)
RDW: 13.6 % (ref 11.5–15.5)
WBC: 12.5 10*3/uL — ABNORMAL HIGH (ref 4.0–10.5)
nRBC: 0 % (ref 0.0–0.2)

## 2023-09-19 LAB — BRAIN NATRIURETIC PEPTIDE: B Natriuretic Peptide: 1553.1 pg/mL — ABNORMAL HIGH (ref 0.0–100.0)

## 2023-09-19 LAB — TROPONIN I (HIGH SENSITIVITY): Troponin I (High Sensitivity): 7526 ng/L (ref <18)

## 2023-09-19 SURGERY — LEFT HEART CATH AND CORONARY ANGIOGRAPHY
Anesthesia: LOCAL

## 2023-09-19 MED ORDER — ROSUVASTATIN CALCIUM 20 MG PO TABS
20.0000 mg | ORAL_TABLET | Freq: Every day | ORAL | Status: DC
  Administered 2023-09-19 – 2023-09-25 (×7): 20 mg via ORAL
  Filled 2023-09-19 (×8): qty 1

## 2023-09-19 MED ORDER — FENTANYL CITRATE (PF) 100 MCG/2ML IJ SOLN
INTRAMUSCULAR | Status: AC
  Filled 2023-09-19: qty 2

## 2023-09-19 MED ORDER — HEPARIN (PORCINE) 25000 UT/250ML-% IV SOLN
1100.0000 [IU]/h | INTRAVENOUS | Status: DC
  Administered 2023-09-19 – 2023-09-22 (×5): 1100 [IU]/h via INTRAVENOUS
  Filled 2023-09-19 (×6): qty 250

## 2023-09-19 MED ORDER — LIDOCAINE HCL (PF) 1 % IJ SOLN
INTRAMUSCULAR | Status: DC | PRN
  Administered 2023-09-19: 5 mL

## 2023-09-19 MED ORDER — VERAPAMIL HCL 2.5 MG/ML IV SOLN
INTRAVENOUS | Status: DC | PRN
  Administered 2023-09-19: 5 mL via INTRA_ARTERIAL

## 2023-09-19 MED ORDER — LIDOCAINE HCL (PF) 1 % IJ SOLN
INTRAMUSCULAR | Status: AC
  Filled 2023-09-19: qty 30

## 2023-09-19 MED ORDER — MIDAZOLAM HCL 2 MG/2ML IJ SOLN
INTRAMUSCULAR | Status: DC | PRN
  Administered 2023-09-19: 2 mg via INTRAVENOUS

## 2023-09-19 MED ORDER — MIDAZOLAM HCL 2 MG/2ML IJ SOLN
INTRAMUSCULAR | Status: AC
  Filled 2023-09-19: qty 2

## 2023-09-19 MED ORDER — HEPARIN SODIUM (PORCINE) 1000 UNIT/ML IJ SOLN
INTRAMUSCULAR | Status: DC | PRN
  Administered 2023-09-19: 4000 [IU] via INTRAVENOUS

## 2023-09-19 MED ORDER — VERAPAMIL HCL 2.5 MG/ML IV SOLN
INTRAVENOUS | Status: AC
  Filled 2023-09-19: qty 2

## 2023-09-19 MED ORDER — ASPIRIN 81 MG PO CHEW: 81.0000 mg | CHEWABLE_TABLET | ORAL | Status: DC

## 2023-09-19 MED ORDER — SACUBITRIL-VALSARTAN 24-26 MG PO TABS: 1.0000 | ORAL_TABLET | Freq: Two times a day (BID) | ORAL | Status: DC

## 2023-09-19 MED ORDER — SODIUM CHLORIDE 0.9 % IV SOLN: INTRAVENOUS | Status: DC

## 2023-09-19 MED ORDER — METOPROLOL TARTRATE 50 MG PO TABS: 50.0000 mg | ORAL_TABLET | Freq: Two times a day (BID) | ORAL | Status: DC

## 2023-09-19 MED ORDER — FENTANYL CITRATE (PF) 100 MCG/2ML IJ SOLN
INTRAMUSCULAR | Status: DC | PRN
  Administered 2023-09-19: 25 ug via INTRAVENOUS

## 2023-09-19 MED ORDER — HEPARIN SODIUM (PORCINE) 1000 UNIT/ML IJ SOLN
INTRAMUSCULAR | Status: AC
  Filled 2023-09-19: qty 10

## 2023-09-19 MED ORDER — HEPARIN (PORCINE) IN NACL 1000-0.9 UT/500ML-% IV SOLN
INTRAVENOUS | Status: DC | PRN
  Administered 2023-09-19 (×2): 500 mL via INTRA_ARTERIAL

## 2023-09-19 MED ORDER — CARVEDILOL 3.125 MG PO TABS
3.1250 mg | ORAL_TABLET | Freq: Two times a day (BID) | ORAL | Status: DC
  Administered 2023-09-19 – 2023-09-22 (×7): 3.125 mg via ORAL
  Filled 2023-09-19 (×7): qty 1

## 2023-09-19 MED ORDER — SODIUM CHLORIDE 0.9% FLUSH
3.0000 mL | Freq: Two times a day (BID) | INTRAVENOUS | Status: DC
  Administered 2023-09-19 – 2023-09-22 (×7): 3 mL via INTRAVENOUS

## 2023-09-19 MED ORDER — IOHEXOL 350 MG/ML SOLN
INTRAVENOUS | Status: DC | PRN
  Administered 2023-09-19: 70 mL via INTRA_ARTERIAL

## 2023-09-19 MED ORDER — MELATONIN 5 MG PO TABS
5.0000 mg | ORAL_TABLET | Freq: Every evening | ORAL | Status: DC | PRN
  Administered 2023-09-19 – 2023-09-22 (×3): 5 mg via ORAL
  Filled 2023-09-19 (×3): qty 1

## 2023-09-19 SURGICAL SUPPLY — 9 items
CATH INFINITI 5 FR JL3.5 (CATHETERS) IMPLANT
CATH INFINITI 5FR MPB2 (CATHETERS) IMPLANT
DEVICE RAD COMP TR BAND LRG (VASCULAR PRODUCTS) IMPLANT
ELECT DEFIB PAD ADLT CADENCE (PAD) IMPLANT
GLIDESHEATH SLEND A-KIT 6F 22G (SHEATH) IMPLANT
GUIDEWIRE INQWIRE 1.5J.035X260 (WIRE) IMPLANT
INQWIRE 1.5J .035X260CM (WIRE) ×1
PACK CARDIAC CATHETERIZATION (CUSTOM PROCEDURE TRAY) ×1 IMPLANT
SET ATX-X65L (MISCELLANEOUS) IMPLANT

## 2023-09-19 NOTE — ED Notes (Signed)
Pt on the way to cath lab via stretcher. Pt A&Ox3, denies pain at this time.

## 2023-09-19 NOTE — Consult Note (Addendum)
[address]       Jesse Coleman [address]             [phone]        Jani Brouhard [facility] Medical Record #[id number] Date of Birth: 03-Jun-1956  Referring: Jacinto Halim Primary Care: Adrian Prince, MD Primary Cardiologist:Mark Anne Fu, MD  Chief Complaint:    Chief Complaint  Patient presents with   Chest Pain   History of Present Illness:      Jesse Coleman is a 67 yo male with history of unprovoked PE, HTN, HLD, Ischemic Cardiomyopathy, and Pituitary Adenoma managed medically.  He was brought to the Emergency Department via EMS with complaints of mid sternal chest pain that developed while blowing leaves.  The pain did not radiate was associated with SOB, but he denid diaphoresis and N/V.  He took 5 SL NTG tablets without relief of symptoms.Workup in the ED showed elevated Troponin level.  EKG showed left bundle branch block which is long standing with subtle changes.  Cardiology consult was obtained who recommended NTG and Heparin drip with transfer to [facility] for further workup and hospitalist admission.  Upon further questioning of the patient he has been experiencing anginal symptoms for months with daily exertional activities.  He was last cathed in 2014 at which time he was noted to have severe 3 vessel CAD with severe stenosis in small caliber vessels.  It was felt medical management at that time.  He has been followed up Cardiology since that time.  He underwent repeat catheterization which showed severe diffuse calcific disease 3v CAD with reduced EF of 25 %.  TCTS consult was requested to consider patient for possible coronary bypass procedure.  The patient is currently having some mild chest pain.  He again states his symptoms have been occurring for a while with minimal exertion.  They have not yet occurred with rest.  He states his father had premature CAD.  Current Activity/ Functional Status: Patient is independent with mobility/ambulation, transfers, ADL's,  IADL's.   Zubrod Score: At the time of surgery this patient's most appropriate activity status/level should be described as: []     0    Normal activity, no symptoms [x]     1    Restricted in physical strenuous activity but ambulatory, able to do out light work []     2    Ambulatory and capable of self care, unable to do work activities, up and about                 more than 50%  Of the time                            []     3    Only limited self care, in bed greater than 50% of waking hours []     4    Completely disabled, no self care, confined to bed or chair []     5    Moribund  Past Medical History:  Diagnosis Date   CAD (coronary artery disease)    Erectile dysfunction    Hematospermia    Hyperlipidemia    Hypertension 2014   Overweight(278.02)    Prolactin-secreting pituitary adenoma (HCC) 1999   Medical therapy-1999    Past Surgical History:  Procedure Laterality Date   CARDIAC CATHETERIZATION  2014   LAD 40%, D1&D2 70-80% (1.52mm), CFX 20%, OM1 & OM2 90% (1.O mm), OM3 50%,  RCA 90% (1.5-1.75 mm), EF 40-45%, med rx   COLONOSCOPY  2008   [place], [place]   Dental implant  2007   HERNIA REPAIR  2010   [place], [place]   KNEE SURGERY     REFRACTIVE SURGERY Right 2003   VASECTOMY  2003    Social History   Tobacco Use  Smoking Status Never  Smokeless Tobacco Never    Social History   Substance and Sexual Activity  Alcohol Use Yes   Alcohol/week: 1.0 standard drink of alcohol   Types: 1 drink(s) per week     Allergies  Allergen Reactions   Amoxicillin-Pot Clavulanate Nausea And Vomiting   Erythromycin Nausea And Vomiting   Latex Swelling and Other (See Comments)    Lips swell, but no breathing issues    Current Facility-Administered Medications  Medication Dose Route Frequency Provider Last Rate Last Admin   0.9 %  sodium chloride infusion   Intravenous Continuous Yates Decamp, MD       Mitzi Hansen Hold] acetaminophen (TYLENOL) tablet 650 mg  650 mg Oral  Q4H PRN Elgergawy, Leana Roe, MD   650 mg at 09/19/23 2725   aspirin chewable tablet 81 mg  81 mg Oral Pre-Cath Hammock, Sheri, NP       [MAR Hold] aspirin EC tablet 81 mg  81 mg Oral Daily Elgergawy, Leana Roe, MD       fentaNYL (SUBLIMAZE) injection    PRN Yates Decamp, MD   25 mcg at 09/19/23 3664   Heparin (Porcine) in NaCl 1000-0.9 UT/500ML-% SOLN    PRN Yates Decamp, MD   500 mL at 09/19/23 0932   heparin ADULT infusion 100 units/mL (25000 units/218mL)  1,100 Units/hr Intravenous Continuous Juliette Mangle, RPH 11 mL/hr at 09/19/23 0419 1,100 Units/hr at 09/19/23 0419   heparin sodium (porcine) injection    PRN Yates Decamp, MD   4,000 Units at 09/19/23 0934   iohexol (OMNIPAQUE) 350 MG/ML injection    PRN Yates Decamp, MD   70 mL at 09/19/23 0947   lidocaine (PF) (XYLOCAINE) 1 % injection    PRN Yates Decamp, MD   5 mL at 09/19/23 0931   [MAR Hold] LORazepam (ATIVAN) tablet 0.5 mg  0.5 mg Oral Q6H PRN Elgergawy, Leana Roe, MD   0.5 mg at 09/18/23 1917   [MAR Hold] metoprolol tartrate (LOPRESSOR) tablet 50 mg  50 mg Oral BID Laverda Page B, NP       midazolam (VERSED) injection    PRN Yates Decamp, MD   2 mg at 09/19/23 0921   [MAR Hold] nitroGLYCERIN (NITROSTAT) SL tablet 0.4 mg  0.4 mg Sublingual Q5 Min x 3 PRN Elgergawy, Leana Roe, MD       nitroGLYCERIN 50 mg in dextrose 5 % 250 mL (0.2 mg/mL) infusion  5-200 mcg/min Intravenous Continuous Elgergawy, Leana Roe, MD 6 mL/hr at 09/18/23 2306 20 mcg/min at 09/18/23 2306   [MAR Hold] ondansetron (ZOFRAN) injection 4 mg  4 mg Intravenous Q6H PRN Elgergawy, Leana Roe, MD       [MAR Hold] pantoprazole (PROTONIX) EC tablet 40 mg  40 mg Oral Daily Elgergawy, Leana Roe, MD   40 mg at 09/18/23 1723   Radial Cocktail/Verapamil only    PRN Yates Decamp, MD   5 mL at 09/19/23 0932   [MAR Hold] rosuvastatin (CRESTOR) tablet 20 mg  20 mg Oral Daily Arty Baumgartner, NP        Medications Prior to Admission  Medication  Sig Dispense Refill Last Dose/Taking    bromocriptine (PARLODEL) 2.5 MG tablet Take 1.25 mg by mouth at bedtime.    09/17/2023 Bedtime   DULoxetine (CYMBALTA) 30 MG capsule Take 30 mg by mouth daily.   09/17/2023 Bedtime   ELIQUIS 5 MG TABS tablet TAKE 1 TABLET BY MOUTH TWICE A DAY. 60 tablet 0 09/18/2023 at  8:00 AM   EPINEPHrine 0.3 mg/0.3 mL IJ SOAJ injection Inject 0.3 mLs (0.3 mg total) into the muscle as needed for anaphylaxis. 1 each 0 Taking As Needed   HYDROcodone-acetaminophen (NORCO/VICODIN) 5-325 MG tablet Take 1 tablet by mouth every 6 (six) hours as needed for moderate pain (pain score 4-6).   Past Month   isosorbide mononitrate (IMDUR) 30 MG 24 hr tablet Take 30 mg by mouth in the morning and at bedtime.   09/18/2023 Morning   LORazepam (ATIVAN) 1 MG tablet Take 1 mg by mouth every 8 (eight) hours as needed for anxiety.   Past Month   NITROSTAT 0.4 MG SL tablet Place 0.4 mg under the tongue every 5 (five) minutes as needed for chest pain.   09/18/2023 Morning   rosuvastatin (CRESTOR) 5 MG tablet Take 5 mg by mouth once a week.   09/14/2023 Evening   sildenafil (VIAGRA) 100 MG tablet TAKE 1 TABLET BY MOUTH 30CMINUTES PRIOR TO 1HR BEFORE RELATIONS.   Past Month   vitamin B-12 (CYANOCOBALAMIN) 100 MCG tablet Take 100 mcg by mouth 3 (three) times a week.   Past Week   alfuzosin (UROXATRAL) 10 MG 24 hr tablet Take 10 mg by mouth daily.   09/08/2023    Family History  Problem Relation Age of Onset   Coronary artery disease Father 68       sudden cardiac death   Emphysema Mother        smoked   Review of Systems:       Cardiac Review of Systems: Y or  [    ]= no  Chest Pain [ Y   ]  Resting SOB [   ] Exertional SOB  [ Y ]  Orthopnea [  ]   Pedal Edema [N    Palpitations [ N ] Syncope  [  ]   Presyncope [   ]  General Review of Systems: [Y] = yes [  ]=no Constitional: recent weight change [  ]; anorexia [  ]; fatigue [  ]; nausea [ N ]; night sweats [  ]; fever [  ]; or chills [  ]                                                                Dental: Last Dentist visit:   Eye : blurred vision [  ]; diplopia [   ]; vision changes [  ];  Amaurosis fugax[  ]; Resp: cough [  ];  wheezing[  ];  hemoptysis[  ]; shortness of breath[  ]; paroxysmal nocturnal dyspnea[  ]; dyspnea on exertion[  ]; or orthopnea[  ];  GI:  gallstones[  ], vomiting[ N ];  dysphagia[  ]; melena[  ];  hematochezia [  ]; heartburn[  ];   Hx of  Colonoscopy[  ]; GU: kidney stones [  ]; hematuria[  ];  dysuria [  ];  nocturia[  ];  history of     obstruction [  ]; urinary frequency [  ]             Skin: rash, swelling[ N ];, hair loss[  ];  peripheral edema[ N ];  or itching[  ]; Musculosketetal: myalgias[  ];  joint swelling[  ];  joint erythema[  ];  joint pain[  ];  back pain[  ];  Heme/Lymph: bruising[  ];  bleeding[  ];  anemia[  ];  Neuro: TIA[  ];  headaches[  ];  stroke[N  ];  vertigo[  ];  seizures[  ];   paresthesias[  ];  difficulty walking[  ];  Psych:depression[  ]; anxiety[  ];  Endocrine: diabetes[ N ];  thyroid dysfunction[N  ];  Physical Exam: BP 111/74   Pulse 70   Temp 98.6 F (37 C) (Oral)   Resp 14   Ht 5\' 10"  (1.778 m)   Wt 89.4 kg   SpO2 93%   BMI 28.27 kg/m   General appearance: alert, cooperative, and no distress Head: Normocephalic, without obvious abnormality, atraumatic Neck: no adenopathy, no carotid bruit, no JVD, supple, symmetrical, trachea midline, and thyroid not enlarged, symmetric, no tenderness/mass/nodules Resp: clear to auscultation bilaterally Cardio: regular rate and rhythm, S1, S2 normal, no murmur, click, rub or gallop GI: soft, non-tender; bowel sounds normal; no masses,  no organomegaly Extremities: extremities normal, atraumatic, no cyanosis or edema Neurologic: Grossly normal  Diagnostic Studies & Laboratory data:     Recent Radiology Findings:   CARDIAC CATHETERIZATION Addendum Date: 09/19/2023 Left Heart Catheterization 09/19/23: Hemodynamic data: LV 122/0, EDP 10 mmHg.  Ao  108/61, mean 79 mmHg.  No pressure gradient across the aortic valve. Angiographic data: Severe coronary calcification of all the coronary arteries. LV: Marked global hypokinesis.  LVEF 25 to at most 30%. LM: Calcified but large-caliber vessel.  No luminal obstruction. LAD: Severely calcified throughout.  Proximal LAD has eccentric 70 to 80% stenosis. Small D1 and D2 with high-grade ostial stenosis of 90% and diffuse disease.  Moderate size D3 with ostial 99% stenosis.  LAD CTO after D3 origin.  Distal LAD collateralized by ipsilateral and contralateral collaterals.  Distal LAD appears bypassable. LCx: Dominant.  Moderate disease in the ostium and proximal segment of 40%.  Very tiny OM1 and 2 with severe disease, large OM 3 with ostial 90% stenosis.  Distal CX has mild disease. RI: CTO in the ostium.  Ipsilateral collaterals.  The vessel faintly and appears to be small to moderate caliber vessel however large distribution. RCA: Nondominant with diffuse disease with a proximal 90% focal stenosis.  Gives collaterals to distal LAD. Impression and recommendations: Severe diffuse calcific three-vessel coronary artery disease.  Severely reduced LVEF.  Will refer for CT surgery for consideration for CABG.  Result Date: 09/19/2023 Images from the original result were not included. Left Heart Catheterization 09/19/23: Hemodynamic data: LV 122/0, EDP 10 mmHg.  Ao 108/61, mean 79 mmHg.  No pressure gradient across the aortic valve. Angiographic data: Severe coronary calcification of all the coronary arteries. LV: Marked global hypokinesis.  LVEF 25 to at most 30%. LM: Calcified but large-caliber vessel.  No luminal obstruction. LAD: Severely calcified throughout.  Proximal LAD has eccentric 70 to 80% stenosis. Small D1 and D2 with high-grade ostial stenosis of 90% and diffuse disease.  Moderate size D3, free of disease.  LAD CTO after D3 origin.  Distal LAD collateralized by ipsilateral and  contralateral collaterals.  Distal  LAD appears bypassable. LCx: Dominant.  Moderate disease in the ostium and proximal segment of 40%.  Very tiny OM1 and 2 with severe disease, large OM 3 with ostial 90% stenosis.  Distal CX has mild disease. RI: CTO in the ostium.  Ipsilateral collaterals.  The vessel faintly and appears to be small to moderate caliber vessel however large distribution. RCA: Nondominant with diffuse disease with a proximal 90% focal stenosis.  Gives collaterals to distal LAD. Impression and recommendations: Severe diffuse calcific three-vessel coronary artery disease.  Severely reduced LVEF.  Will refer for CT surgery for consideration for CABG.   DG Chest Port 1 View Result Date: 09/18/2023 CLINICAL DATA:  Hypoxia EXAM: PORTABLE CHEST 1 VIEW COMPARISON:  Film from earlier in the same day. FINDINGS: Cardiac shadow is stable. Mild aortic calcifications are seen. Lungs are well aerated bilaterally. Increased interstitial changes are noted consistent with mild edema. No bony abnormality is seen. IMPRESSION: Mild interstitial edema.  No acute infiltrate is seen. Electronically Signed   By: Alcide Clever M.D.   On: 09/18/2023 22:28   DG Chest 2 View Result Date: 09/18/2023 CLINICAL DATA:  cp EXAM: CHEST - 2 VIEW COMPARISON:  10/09/2020. FINDINGS: The heart size and mediastinal contours are within normal limits. Both lungs are clear. No pneumothorax or pleural effusion. Aorta is calcified. There are thoracic degenerative changes. IMPRESSION: No acute cardiopulmonary disease. Electronically Signed   By: Layla Maw M.D.   On: 09/18/2023 19:03     I have independently reviewed the above radiologic studies and discussed with the patient   Recent Lab Findings: Lab Results  Component Value Date   WBC 12.5 (H) 09/19/2023   HGB 17.0 09/19/2023   HCT 51.1 09/19/2023   PLT 179 09/19/2023   GLUCOSE 110 (H) 09/19/2023   ALT 9 08/25/2023   AST 19 08/25/2023   NA 139 09/19/2023   K 3.8 09/19/2023   CL 107 09/19/2023    CREATININE 1.11 09/19/2023   BUN 10 09/19/2023   CO2 20 (L) 09/19/2023   INR 1.05 01/29/2013   HGBA1C 5.4 04/23/2019    Assessment / Plan:      Severe Calcific 3V CAD, cath in 2014 managed medically, ruled in for NSTEMI this admission- request CABG consult Unprovoked PE- last dose of Eliquis was 12/18 HTN HLD Ischemic Cardiomyopathy- reduced EF of 25% down from 40 in 2014  Patient with mild chest pain currently. He is on NTG and Heparin will be resumed.  His last dose of Eliquis was 12/18.  Both catheterizations show small calcific vessels, ?targets appropriate for bypass?  Would not start Entresto in case patient is a surgical candidate.  He will require washout prior to surgery.  Dr. [name] will evaluate and determine candidacy and potential timing of surgery.  I  spent 40 minutes counseling the patient face to face.   Lowella Dandy, PA-C 09/19/2023 10:49 AM  Patient seen and examined, records reviewed and cath images reviewed. 67 yo man with known CAD and chronic stable agina presents with unstable CP and r/I for non STEMI.  Cath shows severe 3 vessel CAD, diffusely diseased poor quality targets and severe LV dysfunction with EF ~ 25%.  CABG indicated for survival benefit and relief of symptoms.  High risk due to severe LV dysfunction and poor quality targets but does appear graftable and prognosis without revascularization is dismal.  I discussed the general nature of the procedure, including the need for general anesthesia, the  incisions to be used, the use of cardiopulmonary bypass, the possible need for temporary mechanical support and the use of drainage tubes and temporary pacemaker wires with Mr. Franta.  We discussed the expected hospital stay, overall recovery and short and long term outcomes. I informed him of the indications, risks, benefits and alternatives.  He understands the risks include, but are not limited to death, stroke, MI, DVT/PE, bleeding, possible need  for transfusion, infections, cardiac arrhythmias, as well as other organ system dysfunction including respiratory, renal, or GI complications. He is willing to accept the risks and agrees to proceed.   Salvatore Decent [name], MD [facility] [phone]

## 2023-09-19 NOTE — Progress Notes (Signed)
PHARMACY - ANTICOAGULATION CONSULT NOTE  Pharmacy Consult for heparin Indication:  NSTEMI and h/o PE  Labs: Recent Labs    09/18/23 1512 09/18/23 1902 09/19/23 0329  HGB 14.8  --  17.0  HCT 44.6  --  51.1  PLT 152  --  179  APTT 49*  --  119*  HEPARINUNFRC >1.10*  --   --   CREATININE 0.94  --   --   TROPONINIHS 771* 6,408*  --    Assessment: 67yo male supratherapeutic on heparin with initial dosing for NSTEMI; no infusion issues or signs of bleeding per RN.  Goal of Therapy:  aPTT 66-102 seconds   Plan:  Decrease heparin infusion by 1-2 units/kg/hr to 1100 units/hr. Check PTT in 6 hours.   Vernard Gambles, PharmD, BCPS 09/19/2023 4:19 AM

## 2023-09-19 NOTE — Progress Notes (Signed)
PHARMACY - ANTICOAGULATION CONSULT NOTE  Pharmacy Consult for heparin Indication: chest pain/ACS  Allergies  Allergen Reactions   Amoxicillin-Pot Clavulanate Nausea And Vomiting   Erythromycin Nausea And Vomiting   Latex Swelling and Other (See Comments)    Lips swell, but no breathing issues    Patient Measurements: Height: 5\' 10"  (177.8 cm) Weight: 89.4 kg (197 lb) IBW/kg (Calculated) : 73 HEPARIN DW (KG): 89.4   Vital Signs: Temp: 98.6 F (37 C) (12/20 0412) Temp Source: Oral (12/20 0412) BP: 122/80 (12/20 1200) Pulse Rate: 71 (12/20 1200)  Labs: Recent Labs    09/18/23 1512 09/18/23 1902 09/19/23 0329 09/19/23 0504  HGB 14.8  --  17.0  --   HCT 44.6  --  51.1  --   PLT 152  --  179  --   APTT 49*  --  119*  --   HEPARINUNFRC >1.10*  --   --   --   CREATININE 0.94  --  1.11  --   TROPONINIHS 771* 6,408*  --  7,526*    Estimated Creatinine Clearance: 70.7 mL/min (by C-G formula based on SCr of 1.11 mg/dL).   Medical History: Past Medical History:  Diagnosis Date   CAD (coronary artery disease)    Erectile dysfunction    Hematospermia    Hyperlipidemia    Hypertension 2014   Overweight(278.02)    Prolactin-secreting pituitary adenoma [facility]) 1999   Medical therapy-1999     Assessment: 25 yoM admitted with CP started on IV heparin. Pt s/p LHC with mvCAD, planning for CABG referral. Pt on apixaban PTA for hx PE.  Discussed w/ Dr. Lynnette Caffey - will resume IV heparin no bolus 8h after sheath removal (~1000).  Goal of Therapy:  Heparin level 0.3-0.7 units/ml aPTT 66-102 seconds Monitor platelets by anticoagulation protocol: Yes   Plan:  Resume heparin 1100 units/h no bolus at 1800 Check aPTT and heparin level 8h after resuming  Fredonia Highland, PharmD, BCPS, [facility] Clinical Pharmacist [phone] Please check AMION for all [facility] Pharmacy numbers 09/19/2023

## 2023-09-19 NOTE — Progress Notes (Signed)
Left Heart Catheterization 09/19/23:   I have discontinued his IV heparin and transition him to subcu heparin for now, eventually will have to resume his Eliquis for history of unprovoked PE in the past.  Will place consultation for CVTS for consideration for bypass surgery.  LVEF is around 25%, discontinued metoprolol and switched him to carvedilol 3.125 mg twice daily and started him on Entresto 24/26 mg twice daily.  Wife updated.  Yates Decamp, MD, [facility] 09/19/2023, 10:32 AM [facility] [address] [place], [place] [address] Phone: [phone]. Fax:  [phone]

## 2023-09-19 NOTE — ED Notes (Signed)
ED TO INPATIENT HANDOFF REPORT  ED Nurse Name and Phone #: Theophilus Bones [phone]  S Name/Age/Gender Jesse Coleman Alert 67 y.o. male Room/Bed: 021C/021C  Code Status   Code Status: Full Code  Home/SNF/Other Home Patient oriented to: self, place, time, and situation Is this baseline? Yes   Triage Complete: Triage complete  Chief Complaint NSTEMI (non-ST elevated myocardial infarction) [facility]) [I21.4]  Triage Note Pt in from home via [facility], per report pt c/o mid non radiating substernal CP while blowing leaves today, pt took x 5 sL nitroglycerin pta without relief, no relief with resting, pain 8/10, pt denies SOB, n/v/d, diaphoresis, A&O x4, refused IV pta, pt BP 180/108 initially, pt rcvd 1 sL nitroglycerin via EMS with 147/86 post admin pain to 4/10 upon arrival to ED, ST with LBB    Allergies Allergies  Allergen Reactions   Amoxicillin-Pot Clavulanate Nausea And Vomiting   Erythromycin Nausea And Vomiting   Latex Swelling and Other (See Comments)    Lips swell, but no breathing issues    Level of Care/Admitting Diagnosis ED Disposition     ED Disposition  Admit   Condition  --   Comment  Hospital Area: [facility] [[id number]]  Level of Care: Progressive [102]  Admit to Progressive based on following criteria: CARDIOVASCULAR & THORACIC of moderate stability with acute coronary syndrome symptoms/low risk myocardial infarction/hypertensive urgency/arrhythmias/heart failure potentially compromising stability and stable post cardiovascular intervention patients.  May admit patient to [facility] or [facility] if equivalent level of care is available:: No  Covid Evaluation: Asymptomatic - no recent exposure (last 10 days) testing not required  Diagnosis: NSTEMI (non-ST elevated myocardial infarction) [facility]) [[id number]]  Admitting Physician: Chiquita Loth  Attending Physician: Starleen Arms [[id number]]  Certification:: I certify this patient will need  inpatient services for at least 2 midnights  Expected Medical Readiness: 09/22/2023          B Medical/Surgery History Past Medical History:  Diagnosis Date   CAD (coronary artery disease)    Erectile dysfunction    Hematospermia    Hyperlipidemia    Hypertension 2014   Overweight(278.02)    Prolactin-secreting pituitary adenoma (HCC) 1999   Medical therapy-1999   Past Surgical History:  Procedure Laterality Date   CARDIAC CATHETERIZATION  2014   LAD 40%, D1&D2 70-80% (1.23mm), CFX 20%, OM1 & OM2 90% (1.O mm), OM3 50%, RCA 90% (1.5-1.75 mm), EF 40-45%, med rx   COLONOSCOPY  2008   [place], [place]   Dental implant  2007   HERNIA REPAIR  2010   [place], [place]   KNEE SURGERY     REFRACTIVE SURGERY Right 2003   VASECTOMY  2003     A IV Location/Drains/Wounds Patient Lines/Drains/Airways Status     Active Line/Drains/Airways     Name Placement date Placement time Site Days   Peripheral IV 09/18/23 18 G Left Antecubital 09/18/23  1458  Antecubital  1   Peripheral IV 09/18/23 20 G Anterior;Left Forearm 09/18/23  1927  Forearm  1   Peripheral IV 09/19/23 20 G Right Forearm 09/19/23  0330  Forearm   less than 1            Intake/Output Last 24 hours  Intake/Output Summary (Last 24 hours) at 09/19/2023 4401 Last data filed at 09/18/2023 2308 Gross per 24 hour  Intake --  Output 1000 ml  Net -1000 ml    Labs/Imaging Results for orders placed or performed during the hospital  encounter of 09/18/23 (from the past 48 hours)  Basic metabolic panel     Status: Abnormal   Collection Time: 09/18/23  3:12 PM  Result Value Ref Range   Sodium 136 135 - 145 mmol/L   Potassium 3.7 3.5 - 5.1 mmol/L   Chloride 107 98 - 111 mmol/L   CO2 20 (L) 22 - 32 mmol/L   Glucose, Bld 88 70 - 99 mg/dL    Comment: Glucose reference range applies only to samples taken after fasting for at least 8 hours.   BUN 13 8 - 23 mg/dL   Creatinine, Ser 1.61 0.61 - 1.24 mg/dL    Calcium 9.6 8.9 - 09.6 mg/dL   GFR, Estimated >04 >54 mL/min    Comment: (NOTE) Calculated using the CKD-EPI Creatinine Equation (2021)    Anion gap 9 5 - 15    Comment: Performed at [facility], [address], [place], [place] [address]  CBC     Status: None   Collection Time: 09/18/23  3:12 PM  Result Value Ref Range   WBC 9.2 4.0 - 10.5 K/uL   RBC 4.81 4.22 - 5.81 MIL/uL   Hemoglobin 14.8 13.0 - 17.0 g/dL   HCT 91.4 78.2 - 95.6 %   MCV 92.7 80.0 - 100.0 fL   MCH 30.8 26.0 - 34.0 pg   MCHC 33.2 30.0 - 36.0 g/dL   RDW 21.3 08.6 - 57.8 %   Platelets 152 150 - 400 K/uL   nRBC 0.0 0.0 - 0.2 %    Comment: Performed at [facility], [address], [place], [place] [address]  Troponin I (High Sensitivity)     Status: Abnormal   Collection Time: 09/18/23  3:12 PM  Result Value Ref Range   Troponin I (High Sensitivity) 771 (HH) <18 ng/L    Comment: CRITICAL RESULT CALLED TO, READ BACK BY AND VERIFIED WITH LONG,L ON 09/18/23 AT 1600 BY LOY,C (NOTE) Elevated high sensitivity troponin I (hsTnI) values and significant  changes across serial measurements may suggest ACS but many other  chronic and acute conditions are known to elevate hsTnI results.  Refer to the "Links" section for chest pain algorithms and additional  guidance. Performed at [facility], [address] [place], [place] [address]   Heparin level (unfractionated)     Status: Abnormal   Collection Time: 09/18/23  3:12 PM  Result Value Ref Range   Heparin Unfractionated >1.10 (H) 0.30 - 0.70 IU/mL    Comment: (NOTE) The clinical reportable range upper limit is being lowered to >1.10 to align with the FDA approved guidance for the current laboratory assay.  If heparin results are below expected values, and patient dosage has  been confirmed, suggest follow up testing of antithrombin III levels. Performed at [facility], [address] [place], [place] [address]   APTT     Status: Abnormal   Collection Time:  09/18/23  3:12 PM  Result Value Ref Range   aPTT 49 (H) 24 - 36 seconds    Comment:        IF BASELINE aPTT IS ELEVATED, SUGGEST PATIENT RISK ASSESSMENT BE USED TO DETERMINE APPROPRIATE ANTICOAGULANT THERAPY. Performed at [facility], [address], [place], [place] [address]   Troponin I (High Sensitivity)     Status: Abnormal   Collection Time: 09/18/23  7:02 PM  Result Value Ref Range   Troponin I (High Sensitivity) 6,408 (HH) <18 ng/L    Comment: DELTA CHECK NOTED READ BACK AND  VERIFIED WITH A JASPER RN 1950 [phone] K FORSYTH (NOTE) Elevated high sensitivity troponin I (hsTnI) values and significant  changes across serial measurements may suggest ACS but many other  chronic and acute conditions are known to elevate hsTnI results.  Refer to the "Links" section for chest pain algorithms and additional  guidance. Performed at [facility], [address], [place], [place] [address]   Basic metabolic panel     Status: Abnormal   Collection Time: 09/19/23  3:29 AM  Result Value Ref Range   Sodium 139 135 - 145 mmol/L   Potassium 3.8 3.5 - 5.1 mmol/L   Chloride 107 98 - 111 mmol/L   CO2 20 (L) 22 - 32 mmol/L   Glucose, Bld 110 (H) 70 - 99 mg/dL    Comment: Glucose reference range applies only to samples taken after fasting for at least 8 hours.   BUN 10 8 - 23 mg/dL   Creatinine, Ser 9.81 0.61 - 1.24 mg/dL   Calcium 9.6 8.9 - 19.1 mg/dL   GFR, Estimated >47 >82 mL/min    Comment: (NOTE) Calculated using the CKD-EPI Creatinine Equation (2021)    Anion gap 12 5 - 15    Comment: Performed at [facility], [address] [place], [place] [address]  Brain natriuretic peptide     Status: Abnormal   Collection Time: 09/19/23  3:29 AM  Result Value Ref Range   B Natriuretic Peptide 1,553.1 (H) 0.0 - 100.0 pg/mL    Comment: Performed at [facility], [address] [place], [place] [address]  APTT     Status: Abnormal   Collection Time: 09/19/23  3:29 AM  Result  Value Ref Range   aPTT 119 (H) 24 - 36 seconds    Comment:        IF BASELINE aPTT IS ELEVATED, SUGGEST PATIENT RISK ASSESSMENT BE USED TO DETERMINE APPROPRIATE ANTICOAGULANT THERAPY. Performed at [facility], [address] [place], [place] [address]   CBC     Status: Abnormal   Collection Time: 09/19/23  3:29 AM  Result Value Ref Range   WBC 12.5 (H) 4.0 - 10.5 K/uL   RBC 5.56 4.22 - 5.81 MIL/uL   Hemoglobin 17.0 13.0 - 17.0 g/dL   HCT 62.9 52.8 - 41.3 %   MCV 91.9 80.0 - 100.0 fL   MCH 30.6 26.0 - 34.0 pg   MCHC 33.3 30.0 - 36.0 g/dL   RDW 24.4 01.0 - 27.2 %   Platelets 179 150 - 400 K/uL   nRBC 0.0 0.0 - 0.2 %    Comment: Performed at [facility], [address], [place], [place] [address]  Troponin I (High Sensitivity)     Status: Abnormal   Collection Time: 09/19/23  5:04 AM  Result Value Ref Range   Troponin I (High Sensitivity) 7,526 (HH) <18 ng/L    Comment: CRITICAL RESULT CALLED TO, READ BACK BY AND VERIFIED WITH HERNANDEZ, H. RN @ [phone] 09/19/23 JBUTLER (NOTE) Elevated high sensitivity troponin I (hsTnI) values and significant  changes across serial measurements may suggest ACS but many other  chronic and acute conditions are known to elevate hsTnI results.  Refer to the "Links" section for chest pain algorithms and additional  guidance. Performed at [facility], [address], [place], [place] [address]    DG Chest Port 1 View Result Date: 09/18/2023 CLINICAL DATA:  Hypoxia EXAM: PORTABLE CHEST 1 VIEW COMPARISON:  Film from earlier in the same day. FINDINGS: Cardiac shadow is stable. Mild aortic calcifications are seen. Lungs are well aerated bilaterally. Increased interstitial changes are noted consistent with mild edema. No bony abnormality is seen. IMPRESSION: Mild interstitial edema.  No acute infiltrate is seen. Electronically Signed   By: Alcide Clever M.D.   On: 09/18/2023 22:28   DG Chest 2 View Result Date: 09/18/2023 CLINICAL  DATA:  cp EXAM: CHEST - 2 VIEW COMPARISON:  10/09/2020. FINDINGS: The heart size and mediastinal contours are within normal limits. Both lungs are clear. No pneumothorax or pleural effusion. Aorta is calcified. There are thoracic degenerative changes. IMPRESSION: No acute cardiopulmonary disease. Electronically Signed   By: Layla Maw M.D.   On: 09/18/2023 19:03    Pending Labs Unresulted Labs (From admission, onward)     Start     Ordered   09/20/23 0500  APTT  Daily,   R      09/19/23 0240   09/20/23 0500  Heparin level (unfractionated)  Daily,   R      09/19/23 0240   09/19/23 1030  APTT  Once-Timed,   TIMED        09/19/23 0419   09/19/23 0240  CBC  Daily,   R      09/19/23 0240            Vitals/Pain Today's Vitals   09/19/23 0507 09/19/23 0600 09/19/23 0630 09/19/23 0700  BP:  118/74 121/75 125/78  Pulse:  75 71 78  Resp:      Temp:      TempSrc:      SpO2:  91% 92% 92%  Weight:      Height:      PainSc: 5        Isolation Precautions No active isolations  Medications Medications  nitroGLYCERIN 50 mg in dextrose 5 % 250 mL (0.2 mg/mL) infusion (20 mcg/min Intravenous Rate/Dose Change 09/18/23 2306)  heparin ADULT infusion 100 units/mL (25000 units/211mL) (1,100 Units/hr Intravenous Rate/Dose Change 09/19/23 0419)  aspirin EC tablet 81 mg (has no administration in time range)  nitroGLYCERIN (NITROSTAT) SL tablet 0.4 mg (has no administration in time range)  acetaminophen (TYLENOL) tablet 650 mg (650 mg Oral Given 09/19/23 0511)  ondansetron (ZOFRAN) injection 4 mg (has no administration in time range)  aspirin EC tablet 325 mg (325 mg Oral Given 09/18/23 1723)  pantoprazole (PROTONIX) EC tablet 40 mg (40 mg Oral Given 09/18/23 1723)  metoprolol tartrate (LOPRESSOR) tablet 25 mg (0 mg Oral Hold 09/19/23 0143)  LORazepam (ATIVAN) tablet 0.5 mg (0.5 mg Oral Given 09/18/23 1917)  ondansetron (ZOFRAN) injection 4 mg (4 mg Intravenous Given 09/18/23 1637)   furosemide (LASIX) injection 80 mg (80 mg Intravenous Given 09/18/23 2046)    Mobility walks     Focused Assessments Cardiac Assessment Handoff:  Cardiac Rhythm: Bundle branch block No results found for: "CKTOTAL", "CKMB", "CKMBINDEX", "TROPONINI" Lab Results  Component Value Date   DDIMER 0.39 08/25/2023   Does the Patient currently have chest pain? No    R Recommendations: See Admitting Provider Note  Report given to:   Additional Notes:

## 2023-09-19 NOTE — Progress Notes (Signed)
TR BAND REMOVAL  LOCATION:  R  radial  DEFLATED PER PROTOCOL: yes    TIME BAND OFF / DRESSING APPLIED: 1230      SITE UPON ARRIVAL:    Level 0   SITE AFTER BAND REMOVAL:    Level 0  CIRCULATION SENSATION AND MOVEMENT:    Within Normal Limits : yes  COMMENTS:

## 2023-09-19 NOTE — Progress Notes (Signed)
On-Call Cardiology  Pt transferred from [facility] given concerns regarding increasing O2 requirement.  Pt given IV lasix at AP, along w/ NTG gtt. Pt's pulm edema responded favorably to IV lasix and NTG gtt.  He looks great at this point; he is in NAD currently and resting quietly.  Few soft bibasilar rales bilaterally.  CP-free, no SOB or increased WOB. He is 98% on 4LNC. Per report, 1100cc out after lasix 80 IV x 1 dose. He remains on low-dose NTG gtt, heparin IV gtt. NSTEMI/Trop 6000+ at last check. Plan is for elective LHC later today. Keep NPO.  Precious Reel, MD , [facility] 09/19/23 1:46 AM

## 2023-09-19 NOTE — ED Notes (Signed)
Patient changed into gown. Patient shaved with clippers. Placed patient on cardiac monitor and Pads.

## 2023-09-19 NOTE — Interval H&P Note (Signed)
History and Physical Interval Note:  09/19/2023 9:11 AM  Jesse Coleman  has presented today for surgery, with the diagnosis of nstemi.  The various methods of treatment have been discussed with the patient and family. After consideration of risks, benefits and other options for treatment, the patient has consented to  Procedure(s): LEFT HEART CATH AND CORONARY ANGIOGRAPHY (N/A) and possible coronary intervention as a surgical intervention.  The patient's history has been reviewed, patient examined, no change in status, stable for surgery.  I have reviewed the patient's chart and labs.  Questions were answered to the patient's satisfaction.   I have personally reviewed the patient's chart, prior notes from interactions with Dr. Anne Fu, discussed with the patient regarding risks and benefits, now presented with NSTEMI/ACS hence we will proceed with cardiac catheterization and possible coronary angioplasty.  Yates Decamp

## 2023-09-19 NOTE — Progress Notes (Signed)
CVTS at bedside

## 2023-09-19 NOTE — Telephone Encounter (Signed)
Pharmacy Patient Advocate Encounter  Insurance verification completed.    The patient is insured through [facility]. Patient has Medicare and is not eligible for a copay card, but may be able to apply for patient assistance, if available.    Ran test claim for Entresto and the current 30 day co-pay is $173.05.   This test claim was processed through [facility]- copay amounts may vary at other pharmacies due to pharmacy/plan contracts, or as the patient moves through the different stages of their insurance plan.

## 2023-09-19 NOTE — H&P (View-Only) (Signed)
[address]       [place] [address]             [phone]        Jani Brouhard [facility] Medical Record #[id number] Date of Birth: 03-Jun-1956  Referring: Jacinto Halim Primary Care: Adrian Prince, MD Primary Cardiologist:Mark Anne Fu, MD  Chief Complaint:    Chief Complaint  Patient presents with   Chest Pain   History of Present Illness:      Jesse Coleman is a 67 yo male with history of unprovoked PE, HTN, HLD, Ischemic Cardiomyopathy, and Pituitary Adenoma managed medically.  He was brought to the Emergency Department via EMS with complaints of mid sternal chest pain that developed while blowing leaves.  The pain did not radiate was associated with SOB, but he denid diaphoresis and N/V.  He took 5 SL NTG tablets without relief of symptoms.Workup in the ED showed elevated Troponin level.  EKG showed left bundle branch block which is long standing with subtle changes.  Cardiology consult was obtained who recommended NTG and Heparin drip with transfer to [facility] for further workup and hospitalist admission.  Upon further questioning of the patient he has been experiencing anginal symptoms for months with daily exertional activities.  He was last cathed in 2014 at which time he was noted to have severe 3 vessel CAD with severe stenosis in small caliber vessels.  It was felt medical management at that time.  He has been followed up Cardiology since that time.  He underwent repeat catheterization which showed severe diffuse calcific disease 3v CAD with reduced EF of 25 %.  TCTS consult was requested to consider patient for possible coronary bypass procedure.  The patient is currently having some mild chest pain.  He again states his symptoms have been occurring for a while with minimal exertion.  They have not yet occurred with rest.  He states his father had premature CAD.  Current Activity/ Functional Status: Patient is independent with mobility/ambulation, transfers, ADL's,  IADL's.   Zubrod Score: At the time of surgery this patient's most appropriate activity status/level should be described as: []     0    Normal activity, no symptoms [x]     1    Restricted in physical strenuous activity but ambulatory, able to do out light work []     2    Ambulatory and capable of self care, unable to do work activities, up and about                 more than 50%  Of the time                            []     3    Only limited self care, in bed greater than 50% of waking hours []     4    Completely disabled, no self care, confined to bed or chair []     5    Moribund  Past Medical History:  Diagnosis Date   CAD (coronary artery disease)    Erectile dysfunction    Hematospermia    Hyperlipidemia    Hypertension 2014   Overweight(278.02)    Prolactin-secreting pituitary adenoma (HCC) 1999   Medical therapy-1999    Past Surgical History:  Procedure Laterality Date   CARDIAC CATHETERIZATION  2014   LAD 40%, D1&D2 70-80% (1.52mm), CFX 20%, OM1 & OM2 90% (1.O mm), OM3 50%,  RCA 90% (1.5-1.75 mm), EF 40-45%, med rx   COLONOSCOPY  2008   [place], [place]   Dental implant  2007   HERNIA REPAIR  2010   [place], [place]   KNEE SURGERY     REFRACTIVE SURGERY Right 2003   VASECTOMY  2003    Social History   Tobacco Use  Smoking Status Never  Smokeless Tobacco Never    Social History   Substance and Sexual Activity  Alcohol Use Yes   Alcohol/week: 1.0 standard drink of alcohol   Types: 1 drink(s) per week     Allergies  Allergen Reactions   Amoxicillin-Pot Clavulanate Nausea And Vomiting   Erythromycin Nausea And Vomiting   Latex Swelling and Other (See Comments)    Lips swell, but no breathing issues    Current Facility-Administered Medications  Medication Dose Route Frequency Provider Last Rate Last Admin   0.9 %  sodium chloride infusion   Intravenous Continuous Yates Decamp, MD       Mitzi Hansen Hold] acetaminophen (TYLENOL) tablet 650 mg  650 mg Oral  Q4H PRN Elgergawy, Leana Roe, MD   650 mg at 09/19/23 2725   aspirin chewable tablet 81 mg  81 mg Oral Pre-Cath Hammock, Sheri, NP       [MAR Hold] aspirin EC tablet 81 mg  81 mg Oral Daily Elgergawy, Leana Roe, MD       fentaNYL (SUBLIMAZE) injection    PRN Yates Decamp, MD   25 mcg at 09/19/23 3664   Heparin (Porcine) in NaCl 1000-0.9 UT/500ML-% SOLN    PRN Yates Decamp, MD   500 mL at 09/19/23 0932   heparin ADULT infusion 100 units/mL (25000 units/218mL)  1,100 Units/hr Intravenous Continuous Juliette Mangle, RPH 11 mL/hr at 09/19/23 0419 1,100 Units/hr at 09/19/23 0419   heparin sodium (porcine) injection    PRN Yates Decamp, MD   4,000 Units at 09/19/23 0934   iohexol (OMNIPAQUE) 350 MG/ML injection    PRN Yates Decamp, MD   70 mL at 09/19/23 0947   lidocaine (PF) (XYLOCAINE) 1 % injection    PRN Yates Decamp, MD   5 mL at 09/19/23 0931   [MAR Hold] LORazepam (ATIVAN) tablet 0.5 mg  0.5 mg Oral Q6H PRN Elgergawy, Leana Roe, MD   0.5 mg at 09/18/23 1917   [MAR Hold] metoprolol tartrate (LOPRESSOR) tablet 50 mg  50 mg Oral BID Laverda Page B, NP       midazolam (VERSED) injection    PRN Yates Decamp, MD   2 mg at 09/19/23 0921   [MAR Hold] nitroGLYCERIN (NITROSTAT) SL tablet 0.4 mg  0.4 mg Sublingual Q5 Min x 3 PRN Elgergawy, Leana Roe, MD       nitroGLYCERIN 50 mg in dextrose 5 % 250 mL (0.2 mg/mL) infusion  5-200 mcg/min Intravenous Continuous Elgergawy, Leana Roe, MD 6 mL/hr at 09/18/23 2306 20 mcg/min at 09/18/23 2306   [MAR Hold] ondansetron (ZOFRAN) injection 4 mg  4 mg Intravenous Q6H PRN Elgergawy, Leana Roe, MD       [MAR Hold] pantoprazole (PROTONIX) EC tablet 40 mg  40 mg Oral Daily Elgergawy, Leana Roe, MD   40 mg at 09/18/23 1723   Radial Cocktail/Verapamil only    PRN Yates Decamp, MD   5 mL at 09/19/23 0932   [MAR Hold] rosuvastatin (CRESTOR) tablet 20 mg  20 mg Oral Daily Arty Baumgartner, NP        Medications Prior to Admission  Medication  Sig Dispense Refill Last Dose/Taking    bromocriptine (PARLODEL) 2.5 MG tablet Take 1.25 mg by mouth at bedtime.    09/17/2023 Bedtime   DULoxetine (CYMBALTA) 30 MG capsule Take 30 mg by mouth daily.   09/17/2023 Bedtime   ELIQUIS 5 MG TABS tablet TAKE 1 TABLET BY MOUTH TWICE A DAY. 60 tablet 0 09/18/2023 at  8:00 AM   EPINEPHrine 0.3 mg/0.3 mL IJ SOAJ injection Inject 0.3 mLs (0.3 mg total) into the muscle as needed for anaphylaxis. 1 each 0 Taking As Needed   HYDROcodone-acetaminophen (NORCO/VICODIN) 5-325 MG tablet Take 1 tablet by mouth every 6 (six) hours as needed for moderate pain (pain score 4-6).   Past Month   isosorbide mononitrate (IMDUR) 30 MG 24 hr tablet Take 30 mg by mouth in the morning and at bedtime.   09/18/2023 Morning   LORazepam (ATIVAN) 1 MG tablet Take 1 mg by mouth every 8 (eight) hours as needed for anxiety.   Past Month   NITROSTAT 0.4 MG SL tablet Place 0.4 mg under the tongue every 5 (five) minutes as needed for chest pain.   09/18/2023 Morning   rosuvastatin (CRESTOR) 5 MG tablet Take 5 mg by mouth once a week.   09/14/2023 Evening   sildenafil (VIAGRA) 100 MG tablet TAKE 1 TABLET BY MOUTH 30CMINUTES PRIOR TO 1HR BEFORE RELATIONS.   Past Month   vitamin B-12 (CYANOCOBALAMIN) 100 MCG tablet Take 100 mcg by mouth 3 (three) times a week.   Past Week   alfuzosin (UROXATRAL) 10 MG 24 hr tablet Take 10 mg by mouth daily.   09/08/2023    Family History  Problem Relation Age of Onset   Coronary artery disease Father 68       sudden cardiac death   Emphysema Mother        smoked   Review of Systems:       Cardiac Review of Systems: Y or  [    ]= no  Chest Pain [ Y   ]  Resting SOB [   ] Exertional SOB  [ Y ]  Orthopnea [  ]   Pedal Edema [N    Palpitations [ N ] Syncope  [  ]   Presyncope [   ]  General Review of Systems: [Y] = yes [  ]=no Constitional: recent weight change [  ]; anorexia [  ]; fatigue [  ]; nausea [ N ]; night sweats [  ]; fever [  ]; or chills [  ]                                                                Dental: Last Dentist visit:   Eye : blurred vision [  ]; diplopia [   ]; vision changes [  ];  Amaurosis fugax[  ]; Resp: cough [  ];  wheezing[  ];  hemoptysis[  ]; shortness of breath[  ]; paroxysmal nocturnal dyspnea[  ]; dyspnea on exertion[  ]; or orthopnea[  ];  GI:  gallstones[  ], vomiting[ N ];  dysphagia[  ]; melena[  ];  hematochezia [  ]; heartburn[  ];   Hx of  Colonoscopy[  ]; GU: kidney stones [  ]; hematuria[  ];  dysuria [  ];  nocturia[  ];  history of     obstruction [  ]; urinary frequency [  ]             Skin: rash, swelling[ N ];, hair loss[  ];  peripheral edema[ N ];  or itching[  ]; Musculosketetal: myalgias[  ];  joint swelling[  ];  joint erythema[  ];  joint pain[  ];  back pain[  ];  Heme/Lymph: bruising[  ];  bleeding[  ];  anemia[  ];  Neuro: TIA[  ];  headaches[  ];  stroke[N  ];  vertigo[  ];  seizures[  ];   paresthesias[  ];  difficulty walking[  ];  Psych:depression[  ]; anxiety[  ];  Endocrine: diabetes[ N ];  thyroid dysfunction[N  ];  Physical Exam: BP 111/74   Pulse 70   Temp 98.6 F (37 C) (Oral)   Resp 14   Ht 5\' 10"  (1.778 m)   Wt 89.4 kg   SpO2 93%   BMI 28.27 kg/m   General appearance: alert, cooperative, and no distress Head: Normocephalic, without obvious abnormality, atraumatic Neck: no adenopathy, no carotid bruit, no JVD, supple, symmetrical, trachea midline, and thyroid not enlarged, symmetric, no tenderness/mass/nodules Resp: clear to auscultation bilaterally Cardio: regular rate and rhythm, S1, S2 normal, no murmur, click, rub or gallop GI: soft, non-tender; bowel sounds normal; no masses,  no organomegaly Extremities: extremities normal, atraumatic, no cyanosis or edema Neurologic: Grossly normal  Diagnostic Studies & Laboratory data:     Recent Radiology Findings:   CARDIAC CATHETERIZATION Addendum Date: 09/19/2023 Left Heart Catheterization 09/19/23: Hemodynamic data: LV 122/0, EDP 10 mmHg.  Ao  108/61, mean 79 mmHg.  No pressure gradient across the aortic valve. Angiographic data: Severe coronary calcification of all the coronary arteries. LV: Marked global hypokinesis.  LVEF 25 to at most 30%. LM: Calcified but large-caliber vessel.  No luminal obstruction. LAD: Severely calcified throughout.  Proximal LAD has eccentric 70 to 80% stenosis. Small D1 and D2 with high-grade ostial stenosis of 90% and diffuse disease.  Moderate size D3 with ostial 99% stenosis.  LAD CTO after D3 origin.  Distal LAD collateralized by ipsilateral and contralateral collaterals.  Distal LAD appears bypassable. LCx: Dominant.  Moderate disease in the ostium and proximal segment of 40%.  Very tiny OM1 and 2 with severe disease, large OM 3 with ostial 90% stenosis.  Distal CX has mild disease. RI: CTO in the ostium.  Ipsilateral collaterals.  The vessel faintly and appears to be small to moderate caliber vessel however large distribution. RCA: Nondominant with diffuse disease with a proximal 90% focal stenosis.  Gives collaterals to distal LAD. Impression and recommendations: Severe diffuse calcific three-vessel coronary artery disease.  Severely reduced LVEF.  Will refer for CT surgery for consideration for CABG.  Result Date: 09/19/2023 Images from the original result were not included. Left Heart Catheterization 09/19/23: Hemodynamic data: LV 122/0, EDP 10 mmHg.  Ao 108/61, mean 79 mmHg.  No pressure gradient across the aortic valve. Angiographic data: Severe coronary calcification of all the coronary arteries. LV: Marked global hypokinesis.  LVEF 25 to at most 30%. LM: Calcified but large-caliber vessel.  No luminal obstruction. LAD: Severely calcified throughout.  Proximal LAD has eccentric 70 to 80% stenosis. Small D1 and D2 with high-grade ostial stenosis of 90% and diffuse disease.  Moderate size D3, free of disease.  LAD CTO after D3 origin.  Distal LAD collateralized by ipsilateral and  contralateral collaterals.  Distal  LAD appears bypassable. LCx: Dominant.  Moderate disease in the ostium and proximal segment of 40%.  Very tiny OM1 and 2 with severe disease, large OM 3 with ostial 90% stenosis.  Distal CX has mild disease. RI: CTO in the ostium.  Ipsilateral collaterals.  The vessel faintly and appears to be small to moderate caliber vessel however large distribution. RCA: Nondominant with diffuse disease with a proximal 90% focal stenosis.  Gives collaterals to distal LAD. Impression and recommendations: Severe diffuse calcific three-vessel coronary artery disease.  Severely reduced LVEF.  Will refer for CT surgery for consideration for CABG.   DG Chest Port 1 View Result Date: 09/18/2023 CLINICAL DATA:  Hypoxia EXAM: PORTABLE CHEST 1 VIEW COMPARISON:  Film from earlier in the same day. FINDINGS: Cardiac shadow is stable. Mild aortic calcifications are seen. Lungs are well aerated bilaterally. Increased interstitial changes are noted consistent with mild edema. No bony abnormality is seen. IMPRESSION: Mild interstitial edema.  No acute infiltrate is seen. Electronically Signed   By: Alcide Clever M.D.   On: 09/18/2023 22:28   DG Chest 2 View Result Date: 09/18/2023 CLINICAL DATA:  cp EXAM: CHEST - 2 VIEW COMPARISON:  10/09/2020. FINDINGS: The heart size and mediastinal contours are within normal limits. Both lungs are clear. No pneumothorax or pleural effusion. Aorta is calcified. There are thoracic degenerative changes. IMPRESSION: No acute cardiopulmonary disease. Electronically Signed   By: Layla Maw M.D.   On: 09/18/2023 19:03     I have independently reviewed the above radiologic studies and discussed with the patient   Recent Lab Findings: Lab Results  Component Value Date   WBC 12.5 (H) 09/19/2023   HGB 17.0 09/19/2023   HCT 51.1 09/19/2023   PLT 179 09/19/2023   GLUCOSE 110 (H) 09/19/2023   ALT 9 08/25/2023   AST 19 08/25/2023   NA 139 09/19/2023   K 3.8 09/19/2023   CL 107 09/19/2023    CREATININE 1.11 09/19/2023   BUN 10 09/19/2023   CO2 20 (L) 09/19/2023   INR 1.05 01/29/2013   HGBA1C 5.4 04/23/2019    Assessment / Plan:      Severe Calcific 3V CAD, cath in 2014 managed medically, ruled in for NSTEMI this admission- request CABG consult Unprovoked PE- last dose of Eliquis was 12/18 HTN HLD Ischemic Cardiomyopathy- reduced EF of 25% down from 40 in 2014  Patient with mild chest pain currently. He is on NTG and Heparin will be resumed.  His last dose of Eliquis was 12/18.  Both catheterizations show small calcific vessels, ?targets appropriate for bypass?  Would not start Entresto in case patient is a surgical candidate.  He will require washout prior to surgery.  Dr. [name] will evaluate and determine candidacy and potential timing of surgery.  I  spent 40 minutes counseling the patient face to face.   Lowella Dandy, PA-C 09/19/2023 10:49 AM  Patient seen and examined, records reviewed and cath images reviewed. 67 yo man with known CAD and chronic stable agina presents with unstable CP and r/I for non STEMI.  Cath shows severe 3 vessel CAD, diffusely diseased poor quality targets and severe LV dysfunction with EF ~ 25%.  CABG indicated for survival benefit and relief of symptoms.  High risk due to severe LV dysfunction and poor quality targets but does appear graftable and prognosis without revascularization is dismal.  I discussed the general nature of the procedure, including the need for general anesthesia, the  incisions to be used, the use of cardiopulmonary bypass, the possible need for temporary mechanical support and the use of drainage tubes and temporary pacemaker wires with Jesse Coleman.  We discussed the expected hospital stay, overall recovery and short and long term outcomes. I informed him of the indications, risks, benefits and alternatives.  He understands the risks include, but are not limited to death, stroke, MI, DVT/PE, bleeding, possible need  for transfusion, infections, cardiac arrhythmias, as well as other organ system dysfunction including respiratory, renal, or GI complications. He is willing to accept the risks and agrees to proceed.   Salvatore Decent [name], MD [facility] [phone]

## 2023-09-19 NOTE — Progress Notes (Signed)
PROGRESS NOTE    Jesse Coleman  ATF:[id number] DOB: April 25, 1956 DOA: 09/18/2023 PCP: Adrian Prince, MD  Chief Complaint  Patient presents with   Chest Pain    Brief Narrative:   Jesse Coleman  is Jesse Coleman 67 y.o. male, with past medical history of known CAD, ischemic cardiomyopathy, provoked PE, on Eliquis, hypertension, hyperlipidemia, adrenal adenoma on medical management who presented to the ED with chest pain, found to have NSTEMI.  Now s/p cath with severe 3 vessel CAD and severely reduced EF.  Planning for CABG.   Assessment & Plan:   Principal Problem:   NSTEMI (non-ST elevated myocardial infarction) (HCC) Active Problems:   Essential hypertension   Hyperlipidemia LDL goal <70   Coronary artery disease involving native coronary artery of native heart without angina pectoris   Ischemic cardiomyopathy   Pulmonary embolism (HCC)  NSTEMI - now s/p LHC with severe diffuse calcific 3 vessel CAD.  Severely reduced LVEF.   - planning for CT surgery eval to consider CABG - heparin/nitroglycerin gtt, aspirin, crestor, coreg - follow A1c, lipids - appreciate CT surgery recs - recommending holding entresto.  Planning for cabg after risk/benefit discussion.     History of CAD Ischemic cardiomyopathy Chronic systolic CHF - HFimpEF (40 to 27% in 2014 improved to 55% in 2020) -Appears  to be [place] euvolemic -Will defer further management to cardiology   History of pulmonary embolism -Continue with full anticoagulation, he is on Eliquis at home, he will be on heparin gtt. Currently   Hypertension -With home beta-blockers -He is on nitro drip as well   Hyperlipidemia -Check lipid panel, continue with high-dose statin    DVT prophylaxis: heparin Code Status: full Family Communication: none Disposition:   Status is: Inpatient Remains inpatient appropriate because: need for CT surgery, cardiology c/s   Consultants:  Cardiology CT surgery  Procedures:  Impression and  recommendations: Severe diffuse calcific three-vessel coronary artery disease.  Severely reduced LVEF.  Will refer for CT surgery for consideration for CABG.  Antimicrobials:  Anti-infectives (From admission, onward)    None       Subjective:  CP improved on nitro  Objective: Vitals:   09/19/23 1245 09/19/23 1300 09/19/23 1400 09/19/23 1601  BP: 132/78 132/80 121/82 (!) 132/92  Pulse: 76 74 79 90  Resp: 17 15 18 15   Temp:    98.2 F (36.8 C)  TempSrc:    Oral  SpO2: 92% 93% 94% 92%  Weight:      Height:        Intake/Output Summary (Last 24 hours) at 09/19/2023 2014 Last data filed at 09/19/2023 1743 Gross per 24 hour  Intake 6 ml  Output 1000 ml  Net -994 ml   Filed Weights   09/18/23 1459  Weight: 89.4 kg    Examination:  General exam: Appears calm and comfortable - seen while he was getting ready to go to cath Respiratory system: unlabored Central nervous system: Alert and oriented.   Data Reviewed: I have personally reviewed following labs and imaging studies  CBC: Recent Labs  Lab 09/18/23 1512 09/19/23 0329  WBC 9.2 12.5*  HGB 14.8 17.0  HCT 44.6 51.1  MCV 92.7 91.9  PLT 152 179    Basic Metabolic Panel: Recent Labs  Lab 09/18/23 1512 09/19/23 0329  NA 136 139  K 3.7 3.8  CL 107 107  CO2 20* 20*  GLUCOSE 88 110*  BUN 13 10  CREATININE 0.94 1.11  CALCIUM 9.6 9.6  GFR: Estimated Creatinine Clearance: 70.7 mL/min (by C-G formula based on SCr of 1.11 mg/dL).  Liver Function Tests: No results for input(s): "AST", "ALT", "ALKPHOS", "BILITOT", "PROT", "ALBUMIN" in the last 168 hours.  CBG: No results for input(s): "GLUCAP" in the last 168 hours.   No results found for this or any previous visit (from the past 240 hours).       Radiology Studies: CARDIAC CATHETERIZATION Addendum Date: 09/19/2023 Left Heart Catheterization 09/19/23: Hemodynamic data: LV 122/0, EDP 10 mmHg.  Ao 108/61, mean 79 mmHg.  No pressure gradient  across the aortic valve. Angiographic data: Severe coronary calcification of all the coronary arteries. LV: Marked global hypokinesis.  LVEF 25 to at most 30%. LM: Calcified but large-caliber vessel.  No luminal obstruction. LAD: Severely calcified throughout.  Proximal LAD has eccentric 70 to 80% stenosis. Small D1 and D2 with high-grade ostial stenosis of 90% and diffuse disease.  Moderate size D3 with ostial 99% stenosis.  LAD CTO after D3 origin.  Distal LAD collateralized by ipsilateral and contralateral collaterals.  Distal LAD appears bypassable. LCx: Dominant.  Moderate disease in the ostium and proximal segment of 40%.  Very tiny OM1 and 2 with severe disease, large OM 3 with ostial 90% stenosis.  Distal CX has mild disease. RI: CTO in the ostium.  Ipsilateral collaterals.  The vessel faintly and appears to be small to moderate caliber vessel however large distribution. RCA: Nondominant with diffuse disease with Ethne Jeon proximal 90% focal stenosis.  Gives collaterals to distal LAD. Impression and recommendations: Severe diffuse calcific three-vessel coronary artery disease.  Severely reduced LVEF.  Will refer for CT surgery for consideration for CABG.  Result Date: 09/19/2023 Images from the original result were not included. Left Heart Catheterization 09/19/23: Hemodynamic data: LV 122/0, EDP 10 mmHg.  Ao 108/61, mean 79 mmHg.  No pressure gradient across the aortic valve. Angiographic data: Severe coronary calcification of all the coronary arteries. LV: Marked global hypokinesis.  LVEF 25 to at most 30%. LM: Calcified but large-caliber vessel.  No luminal obstruction. LAD: Severely calcified throughout.  Proximal LAD has eccentric 70 to 80% stenosis. Small D1 and D2 with high-grade ostial stenosis of 90% and diffuse disease.  Moderate size D3, free of disease.  LAD CTO after D3 origin.  Distal LAD collateralized by ipsilateral and contralateral collaterals.  Distal LAD appears bypassable. LCx: Dominant.   Moderate disease in the ostium and proximal segment of 40%.  Very tiny OM1 and 2 with severe disease, large OM 3 with ostial 90% stenosis.  Distal CX has mild disease. RI: CTO in the ostium.  Ipsilateral collaterals.  The vessel faintly and appears to be small to moderate caliber vessel however large distribution. RCA: Nondominant with diffuse disease with Deaisa Merida proximal 90% focal stenosis.  Gives collaterals to distal LAD. Impression and recommendations: Severe diffuse calcific three-vessel coronary artery disease.  Severely reduced LVEF.  Will refer for CT surgery for consideration for CABG.   DG Chest Port 1 View Result Date: 09/18/2023 CLINICAL DATA:  Hypoxia EXAM: PORTABLE CHEST 1 VIEW COMPARISON:  Film from earlier in the same day. FINDINGS: Cardiac shadow is stable. Mild aortic calcifications are seen. Lungs are well aerated bilaterally. Increased interstitial changes are noted consistent with mild edema. No bony abnormality is seen. IMPRESSION: Mild interstitial edema.  No acute infiltrate is seen. Electronically Signed   By: Alcide Clever M.D.   On: 09/18/2023 22:28   DG Chest 2 View Result Date: 09/18/2023 CLINICAL DATA:  cp  EXAM: CHEST - 2 VIEW COMPARISON:  10/09/2020. FINDINGS: The heart size and mediastinal contours are within normal limits. Both lungs are clear. No pneumothorax or pleural effusion. Aorta is calcified. There are thoracic degenerative changes. IMPRESSION: No acute cardiopulmonary disease. Electronically Signed   By: Layla Maw M.D.   On: 09/18/2023 19:03        Scheduled Meds:  aspirin EC  81 mg Oral Daily   carvedilol  3.125 mg Oral BID WC   pantoprazole  40 mg Oral Daily   rosuvastatin  20 mg Oral Daily   sodium chloride flush  3 mL Intravenous Q12H   Continuous Infusions:  heparin 1,100 Units/hr (09/19/23 1741)   nitroGLYCERIN 20 mcg/min (09/18/23 2306)     LOS: 1 day    Time spent: over 30 min     Lacretia Nicks, MD [facility]   To  contact the attending provider between 7A-7P or the covering provider during after hours 7P-7A, please log into the web site [email or web address] and access using universal [place] password for that web site. If you do not have the password, please call the hospital operator.  09/19/2023, 8:14 PM

## 2023-09-19 NOTE — Progress Notes (Addendum)
   Patient Name: Jesse Coleman Date of Encounter: 09/19/2023 [facility] Cardiologist: Donato Schultz, MD   Interval Summary  .    No chest pain this morning, breathing has improved.   Vital Signs .    Vitals:   09/19/23 0412 09/19/23 0600 09/19/23 0630 09/19/23 0700  BP: 121/85 118/74 121/75 125/78  Pulse: 87 75 71 78  Resp: 16     Temp: 98.6 F (37 C)     TempSrc: Oral     SpO2: 94% 91% 92% 92%  Weight:      Height:        Intake/Output Summary (Last 24 hours) at 09/19/2023 0743 Last data filed at 09/18/2023 2308 Gross per 24 hour  Intake --  Output 1000 ml  Net -1000 ml      09/18/2023    2:59 PM 09/01/2023    8:32 AM 05/14/2023   11:30 AM  Last 3 Weights  Weight (lbs) 197 lb 195 lb 197 lb  Weight (kg) 89.359 kg 88.451 kg 89.359 kg      Telemetry/ECG    Sinus Rhythm, PVCs - Personally Reviewed  Physical Exam .   GEN: No acute distress.   Neck: No JVD Cardiac: RRR, no murmurs, rubs, or gallops.  Respiratory: Clear to auscultation bilaterally. GI: Soft, nontender, non-distended  MS: No edema  Assessment & Plan .     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  High-sensitivity troponin peaked at 7526.  EKG showed sinus rhythm, 95 bpm, left bundle branch block. -- Started on IV heparin, IV NTG, aspirin, metoprolol tartrate 25 mg BID -- Plan for cardiac catheterization today  HFimpEF -- History of LVEF of 40 to 45% in 2014 which improved to 55% -- Developed acute shortness of breath while at [facility], status post IV Lasix 80 mg x 1.  1 L urine output -- Breathing significantly improved -- Continue metoprolol -- Echo pending  History of unprovoked  PE -- Last dose of Eliquis was yesterday morning, on hold -- IV heparin  Hyperlipidemia -- lipids pending -- add statin   Hypertension -- Blood pressures elevated overnight, have since improved -- Continue IV nitroglycerin, metoprolol 25 mg twice daily  For questions or updates, please contact [facility] Please consult [email or web address] for contact info under        Signed, Laverda Page, NP    ATTENDING ATTESTATION:  After conducting a review of all available clinical information with the care team, interviewing the patient, and performing a physical exam, I agree with the findings and plan described in this note.   GEN: No acute distress.   HEENT:  MMM, no JVD, no scleral icterus Cardiac: RRR, no murmurs, rubs, or gallops.  Respiratory: Clear to auscultation bilaterally. GI: Soft, nontender, non-distended  MS: No edema; No deformity. Neuro:  Nonfocal  Vasc:  R radial CDI  Patient seen after diagnostic coronary angiography demonstrating severe MVD.  Patient currently chest pain free on NTG gtt.  Will obtain CTS consult for CABG.  Restart hep gtt.  TTE pending.  Alverda Skeans, MD Pager [phone]

## 2023-09-19 NOTE — Plan of Care (Signed)
Problem: Education: Goal: Understanding of cardiac disease, CV risk reduction, and recovery process will improve Outcome: Progressing Goal: Individualized Educational Video(s) Outcome: Progressing   Problem: Activity: Goal: Ability to tolerate increased activity will improve Outcome: Progressing   Problem: Cardiac: Goal: Ability to achieve and maintain adequate cardiovascular perfusion will improve Outcome: Progressing   Problem: Health Behavior/Discharge Planning: Goal: Ability to safely manage health-related needs after discharge will improve Outcome: Progressing   Problem: Education: Goal: Understanding of CV disease, CV risk reduction, and recovery process will improve Outcome: Progressing Goal: Individualized Educational Video(s) Outcome: Progressing   Problem: Activity: Goal: Ability to return to baseline activity level will improve Outcome: Progressing   Problem: Activity: Goal: Ability to return to baseline activity level will improve Outcome: Progressing   Problem: Cardiovascular: Goal: Ability to achieve and maintain adequate cardiovascular perfusion will improve Outcome: Progressing Goal: Vascular access site(s) Level 0-1 will be maintained Outcome: Progressing   Problem: Health Behavior/Discharge Planning: Goal: Ability to safely manage health-related needs after discharge will improve Outcome: Not Applicable   Problem: Education: Goal: Knowledge of General Education information will improve Description: Including pain rating scale, medication(s)/side effects and non-pharmacologic comfort measures Outcome: Progressing   Problem: Health Behavior/Discharge Planning: Goal: Ability to manage health-related needs will improve Outcome: Progressing   Problem: Clinical Measurements: Goal: Ability to maintain clinical measurements within normal limits will improve Outcome: Progressing Goal: Will remain free from infection Outcome: Progressing Goal:  Diagnostic test results will improve Outcome: Progressing Goal: Respiratory complications will improve Outcome: Progressing Goal: Cardiovascular complication will be avoided Outcome: Progressing   Problem: Health Behavior/Discharge Planning: Goal: Ability to manage health-related needs will improve Outcome: Progressing   Problem: Clinical Measurements: Goal: Ability to maintain clinical measurements within normal limits will improve Outcome: Progressing Goal: Will remain free from infection Outcome: Progressing Goal: Diagnostic test results will improve Outcome: Progressing Goal: Respiratory complications will improve Outcome: Progressing Goal: Cardiovascular complication will be avoided Outcome: Progressing   Problem: Activity: Goal: Risk for activity intolerance will decrease Outcome: Progressing   Problem: Nutrition: Goal: Adequate nutrition will be maintained Outcome: Progressing   Problem: Coping: Goal: Level of anxiety will decrease Outcome: Progressing   Problem: Elimination: Goal: Will not experience complications related to bowel motility Outcome: Progressing Goal: Will not experience complications related to urinary retention Outcome: Progressing   Problem: Pain Management: Goal: General experience of comfort will improve Outcome: Progressing   Problem: Safety: Goal: Ability to remain free from injury will improve Outcome: Progressing   Problem: Skin Integrity: Goal: Risk for impaired skin integrity will decrease Outcome: Progressing   Problem: Education: Goal: Will demonstrate proper wound care and an understanding of methods to prevent future damage Outcome: Progressing Goal: Knowledge of disease or condition will improve Outcome: Progressing Goal: Knowledge of the prescribed therapeutic regimen will improve Outcome: Progressing Goal: Individualized Educational Video(s) Outcome: Progressing   Problem: Activity: Goal: Risk for activity  intolerance will decrease Outcome: Progressing   Problem: Cardiac: Goal: Will achieve and/or maintain hemodynamic stability Outcome: Progressing   Problem: Cardiac: Goal: Will achieve and/or maintain hemodynamic stability Outcome: Progressing   Problem: Clinical Measurements: Goal: Postoperative complications will be avoided or minimized Outcome: Progressing   Problem: Respiratory: Goal: Respiratory status will improve Outcome: Progressing   Problem: Skin Integrity: Goal: Wound healing without signs and symptoms of infection Outcome: Progressing Goal: Risk for impaired skin integrity will decrease Outcome: Progressing   Problem: Urinary Elimination: Goal: Ability to achieve and maintain adequate renal perfusion and functioning will improve Outcome:  Progressing

## 2023-09-20 ENCOUNTER — Inpatient Hospital Stay (HOSPITAL_COMMUNITY): Payer: Medicare Other

## 2023-09-20 DIAGNOSIS — I214 Non-ST elevation (NSTEMI) myocardial infarction: Secondary | ICD-10-CM | POA: Diagnosis not present

## 2023-09-20 DIAGNOSIS — Z01810 Encounter for preprocedural cardiovascular examination: Secondary | ICD-10-CM | POA: Diagnosis not present

## 2023-09-20 DIAGNOSIS — R0609 Other forms of dyspnea: Secondary | ICD-10-CM | POA: Diagnosis not present

## 2023-09-20 LAB — BASIC METABOLIC PANEL
Anion gap: 5 (ref 5–15)
BUN: 16 mg/dL (ref 8–23)
CO2: 25 mmol/L (ref 22–32)
Calcium: 9 mg/dL (ref 8.9–10.3)
Chloride: 106 mmol/L (ref 98–111)
Creatinine, Ser: 1 mg/dL (ref 0.61–1.24)
GFR, Estimated: >60 mL/min (ref >60)
Glucose, Bld: 99 mg/dL (ref 70–99)
Potassium: 3.9 mmol/L (ref 3.5–5.1)
Sodium: 136 mmol/L (ref 135–145)

## 2023-09-20 LAB — CBC
HCT: 46.7 % (ref 39.0–52.0)
Hemoglobin: 15.7 g/dL (ref 13.0–17.0)
MCH: 30.8 pg (ref 26.0–34.0)
MCHC: 33.6 g/dL (ref 30.0–36.0)
MCV: 91.7 fL (ref 80.0–100.0)
Platelets: 161 10*3/uL (ref 150–400)
RBC: 5.09 MIL/uL (ref 4.22–5.81)
RDW: 13.6 % (ref 11.5–15.5)
WBC: 8.7 10*3/uL (ref 4.0–10.5)
nRBC: 0 % (ref 0.0–0.2)

## 2023-09-20 LAB — HEPARIN LEVEL (UNFRACTIONATED)
Heparin Unfractionated: 0.67 [IU]/mL (ref 0.30–0.70)
Heparin Unfractionated: 0.67 [IU]/mL (ref 0.30–0.70)

## 2023-09-20 LAB — ECHOCARDIOGRAM COMPLETE
AR max vel: 2.58 cm2
AV Peak grad: 6 mm[Hg]
Ao pk vel: 1.22 m/s
Area-P 1/2: 6.07 cm2
Height: 70 in
MV M vel: 4.12 m/s
MV Peak grad: 67.9 mm[Hg]
S' Lateral: 3.5 cm
Weight: 3152 [oz_av]

## 2023-09-20 LAB — LIPID PANEL
Cholesterol: 176 mg/dL (ref 0–200)
HDL: 89 mg/dL (ref >40)
LDL Cholesterol: 80 mg/dL (ref 0–99)
Total CHOL/HDL Ratio: 2 {ratio}
Triglycerides: 35 mg/dL (ref <150)
VLDL: 7 mg/dL (ref 0–40)

## 2023-09-20 LAB — APTT: aPTT: 120 s — ABNORMAL HIGH (ref 24–36)

## 2023-09-20 MED ORDER — LIDOCAINE 5 % EX PTCH: 2.0000 | MEDICATED_PATCH | CUTANEOUS | Status: DC

## 2023-09-20 NOTE — Progress Notes (Signed)
   Patient Name: Januel Nuon Date of Encounter: 09/20/2023 [facility] Cardiologist: Donato Schultz, MD   Interval Summary  .    Chest pain has resolved.  He is very cynical about CABG and has more questions about the surgery.  Vital Signs .    Vitals:   09/19/23 2036 09/19/23 2317 09/20/23 0322 09/20/23 0831  BP: 119/70 115/73 132/73 (!) 141/72  Pulse: 75 71 69   Resp: 16 15 18    Temp: 99.1 F (37.3 C) 98.6 F (37 C) 97.6 F (36.4 C)   TempSrc: Oral Oral Oral   SpO2: 93%  92%   Weight:      Height:        Intake/Output Summary (Last 24 hours) at 09/20/2023 1255 Last data filed at 09/20/2023 0831 Gross per 24 hour  Intake 9 ml  Output --  Net 9 ml      09/18/2023    2:59 PM 09/01/2023    8:32 AM 05/14/2023   11:30 AM  Last 3 Weights  Weight (lbs) 197 lb 195 lb 197 lb  Weight (kg) 89.359 kg 88.451 kg 89.359 kg      Telemetry/ECG    Sinus Rhythm, PVCs - Personally Reviewed  Physical Exam .   GEN: No acute distress.   Neck: No JVD Cardiac: RRR, no murmurs, rubs, or gallops.  Respiratory: Clear to auscultation bilaterally. GI: Soft, nontender, non-distended  MS: No edema  Assessment & Plan .     I  NSTEMI CAD- hx medical management -- Presented with chest pain or outside blowing leaves in his yard.  High-sensitivity troponin peaked at 7526.  EKG showed sinus rhythm with left bundle branch block. -- Started on IV heparin, IV NTG, aspirin, metoprolol tartrate 25 mg BID - planned for CABG  HFimpEF -- History of LVEF of 40 to 45% in 2014 which improved to 55% -- Breathing significantly improved -- Continue metoprolol - echo pending from this admission  History of unprovoked PE -- IV heparin, will need post procedural AC when appropriate  Hyperlipidemia - stating added  Hypertension - resolved on current therapy; pending CABG   For questions or updates, please contact [facility] Please consult [email or web address] for contact info  under   Riley Lam, MD FASE [facility] Cardiologist [facility]  [address] [place], [address] [place], [place] [address] [phone]  12:55 PM

## 2023-09-20 NOTE — Progress Notes (Signed)
PROGRESS NOTE    Jesse Coleman  GEX:[id number] DOB: 06-30-1956 DOA: 09/18/2023 PCP: Jesse Prince, MD  Chief Complaint  Patient presents with   Chest Pain    Brief Narrative:   Jesse Coleman  is Jesse Coleman 67 y.o. male, with past medical history of known CAD, ischemic cardiomyopathy, provoked PE, on Eliquis, hypertension, hyperlipidemia, adrenal adenoma on medical management who presented to the ED with chest pain, found to have NSTEMI.  Now s/p cath with severe 3 vessel CAD and severely reduced EF.  Planning for CABG.   Assessment & Plan:   Principal Problem:   NSTEMI (non-ST elevated myocardial infarction) (HCC) Active Problems:   Essential hypertension   Hyperlipidemia LDL goal <70   Coronary artery disease involving native coronary artery of native heart without angina pectoris   Ischemic cardiomyopathy   Pulmonary embolism (HCC)  NSTEMI - now s/p LHC with severe diffuse calcific 3 vessel CAD.  Severely reduced LVEF.   - planning for CT surgery eval to consider CABG - echo with EF 50-55%, regional wall motion abnormalities, apical hypokinesis without thrombus (see report) - VAS US Doppler pre cabg with 1-29% stenosis in bilateral ICA (right ECA >50% stenosed).  Bilateral vertebrals with antegrade flow.  Normal flow hemodynamics in bilateral subclavian arteries. Normal bilateral ABI's.  See report. - heparin/nitroglycerin gtt, aspirin, crestor, coreg - follow A1c (5.2), lipids (LDL 80) - appreciate CT surgery recs - recommending holding entresto.  Planning for cabg after risk/benefit discussion.     History of CAD Ischemic cardiomyopathy Chronic systolic CHF - HFimpEF (40 to 01% in 2014 improved to 55% in 2020) -Appears  to be Jesse Coleman euvolemic -Will defer further management to cardiology   Mild Interstitial Edema - will monitor pulm exam - appears euvolemic  History of pulmonary embolism -Continue with full anticoagulation, he is on Eliquis at home, he will be on heparin gtt.  Currently   Hypertension -With home beta-blockers -He is on nitro drip as well   Hyperlipidemia -Check lipid panel, continue with high-dose statin    DVT prophylaxis: heparin Code Status: full Family Communication: none Disposition:   Status is: Inpatient Remains inpatient appropriate because: need for CT surgery, cardiology c/s   Consultants:  Cardiology CT surgery  Procedures:  Impression and recommendations: Severe diffuse calcific three-vessel coronary artery disease.  Severely reduced LVEF.  Will refer for CT surgery for consideration for CABG.  IMPRESSIONS     1. Left ventricular ejection fraction, by estimation, is 50 to 55%. Left  ventricular ejection fraction by 3D volume is 52 %. The left ventricle has  low normal function. The left ventricle demonstrates regional wall motion  abnormalities (see scoring  diagram/findings for description). Left ventricular diastolic parameters  were normal. apical hypokinesis without thrombus.   2. Right ventricular systolic function is normal. The right ventricular  size is normal.   3. The mitral valve is grossly normal. Mild mitral valve regurgitation.  No evidence of mitral stenosis.   4. The aortic valve is tricuspid. Aortic valve regurgitation is trivial.  No aortic stenosis is present.   Comparison(s): Prior images reviewed side by side. New apical wall motion  abnormalities possible, apex not as well evaluated in 2020 study.    Summary:  Right Carotid: Velocities in the right ICA are consistent with Jesse Coleman 1-39%  stenosis.                The ECA appears >50% stenosed.   Left Carotid: Velocities in the left ICA are consistent  with Jesse Coleman 1-39%  stenosis.  Vertebrals: Bilateral vertebral arteries demonstrate antegrade flow.  Subclavians: Normal flow hemodynamics were seen in bilateral subclavian               arteries.   Right ABI: Resting right ankle-brachial index is within normal range.  Left ABI: Resting left  ankle-brachial index is within normal range.  Right Upper Extremity: Doppler waveforms decrease 50% with right radial  compression. Doppler waveforms decrease >50% with right ulnar compression.  Left Upper Extremity: Doppler waveforms decrease >50% with left radial  compression. Doppler waveforms remain within normal limits with left ulnar  compression.  Antimicrobials:  Anti-infectives (From admission, onward)    None       Subjective:  No complaints today  Objective: Vitals:   09/20/23 0322 09/20/23 0831 09/20/23 1458 09/20/23 1739  BP: 132/73 (!) 141/72 (!) 142/74 (!) 142/73  Pulse: 69  73 86  Resp: 18  18   Temp: 97.6 F (36.4 C)  99.1 F (37.3 C)   TempSrc: Oral  Oral   SpO2: 92%     Weight:      Height:        Intake/Output Summary (Last 24 hours) at 09/20/2023 1837 Last data filed at 09/20/2023 0831 Gross per 24 hour  Intake 3 ml  Output --  Net 3 ml   Filed Weights   09/18/23 1459  Weight: 89.4 kg    Examination:  General: No acute distress. Cardiovascular: RRR Lungs: unlabored Abdomen: Soft, nontender, nondistended Neurological: Alert and oriented 3. Moves all extremities 4 with equal strength. Cranial nerves II through XII grossly intact. Extremities: No clubbing or cyanosis. No edema.  Data Reviewed: I have personally reviewed following labs and imaging studies  CBC: Recent Labs  Lab 09/18/23 1512 09/19/23 0329 09/20/23 0319  WBC 9.2 12.5* 8.7  HGB 14.8 17.0 15.7  HCT 44.6 51.1 46.7  MCV 92.7 91.9 91.7  PLT 152 179 161    Basic Metabolic Panel: Recent Labs  Lab 09/18/23 1512 09/19/23 0329 09/20/23 0319  NA 136 139 136  K 3.7 3.8 3.9  CL 107 107 106  CO2 20* 20* 25  GLUCOSE 88 110* 99  BUN 13 10 16   CREATININE 0.94 1.11 1.00  CALCIUM 9.6 9.6 9.0    GFR: Estimated Creatinine Clearance: 78.5 mL/min (by C-G formula based on SCr of 1 mg/dL).  Liver Function Tests: No results for input(s): "AST", "ALT", "ALKPHOS",  "BILITOT", "PROT", "ALBUMIN" in the last 168 hours.  CBG: No results for input(s): "GLUCAP" in the last 168 hours.   No results found for this or any previous visit (from the past 240 hours).       Radiology Studies: ECHOCARDIOGRAM COMPLETE Result Date: 09/20/2023    ECHOCARDIOGRAM REPORT   Patient Name:   DREDYN CRISTINA Date of Exam: 09/20/2023 Medical Rec #:  [id number]      Height:       70.0 in Accession #:    [id number]     Weight:       197.0 lb Date of Birth:  May 05, 1956      BSA:          2.074 m Patient Age:    67 years       BP:           141/72 mmHg Patient Gender: M              HR:  80 bpm. Exam Location:  Inpatient Procedure: 2D Echo, 3D Echo, Cardiac Doppler and Color Doppler Indications:    Dyspnea R06.00  History:        Patient has prior history of Echocardiogram examinations, most                 recent 06/10/2019. Cardiomyopathy, Previous Myocardial Infarction                 and CAD, Signs/Symptoms:Chest Pain; Risk Factors:Dyslipidemia.  Sonographer:    Lucendia Herrlich RCS Referring Phys: [id number] Yates Decamp IMPRESSIONS  1. Left ventricular ejection fraction, by estimation, is 50 to 55%. Left ventricular ejection fraction by 3D volume is 52 %. The left ventricle has low normal function. The left ventricle demonstrates regional wall motion abnormalities (see scoring diagram/findings for description). Left ventricular diastolic parameters were normal. apical hypokinesis without thrombus.  2. Right ventricular systolic function is normal. The right ventricular size is normal.  3. The mitral valve is grossly normal. Mild mitral valve regurgitation. No evidence of mitral stenosis.  4. The aortic valve is tricuspid. Aortic valve regurgitation is trivial. No aortic stenosis is present. Comparison(s): Prior images reviewed side by side. New apical wall motion abnormalities possible, apex not as well evaluated in 2020 study. FINDINGS  Left Ventricle: Left ventricular ejection fraction,  by estimation, is 50 to 55%. Left ventricular ejection fraction by 3D volume is 52 %. The left ventricle has low normal function. The left ventricle demonstrates regional wall motion abnormalities. The  left ventricular internal cavity size was normal in size. Suboptimal image quality limits for assessment of left ventricular hypertrophy. Left ventricular diastolic parameters were normal.  LV Wall Scoring: The apical lateral segment, apical septal segment, apical anterior segment, and apical inferior segment are hypokinetic. The anterior wall, antero-lateral wall, anterior septum, inferior wall, posterior wall, mid inferoseptal segment, and basal inferoseptal segment are normal. Apical hypokinesis without thrombus. Right Ventricle: The right ventricular size is normal. No increase in right ventricular wall thickness. Right ventricular systolic function is normal. Left Atrium: Left atrial size was normal in size. Right Atrium: Right atrial size was normal in size. Pericardium: There is no evidence of pericardial effusion. Mitral Valve: The mitral valve is grossly normal. Mild mitral valve regurgitation. No evidence of mitral valve stenosis. Tricuspid Valve: The tricuspid valve is normal in structure. Tricuspid valve regurgitation is trivial. No evidence of tricuspid stenosis. Aortic Valve: The aortic valve is tricuspid. Aortic valve regurgitation is trivial. No aortic stenosis is present. Aortic valve peak gradient measures 6.0 mmHg. Pulmonic Valve: The pulmonic valve was not well visualized. Pulmonic valve regurgitation is not visualized. No evidence of pulmonic stenosis. Aorta: The aortic root is normal in size and structure. IAS/Shunts: The atrial septum is grossly normal.  LEFT VENTRICLE PLAX 2D LVIDd:         4.80 cm         Diastology LVIDs:         3.50 cm         LV e' medial:    7.51 cm/s LV PW:         1.10 cm         LV E/e' medial:  8.7 LV IVS:        1.40 cm         LV e' lateral:   9.03 cm/s LVOT diam:      2.00 cm         LV E/e' lateral: 7.3 LV SV:  52 LV SV Index:   25 LVOT Area:     3.14 cm        3D Volume EF                                LV 3D EF:    Left                                             ventricul                                             ar                                             ejection                                             fraction                                             by 3D                                             volume is                                             52 %.                                 3D Volume EF:                                3D EF:        52 %                                LV EDV:       184 ml                                LV ESV:       88 ml                                LV SV:        96 ml RIGHT VENTRICLE RV S prime:     10.80 cm/s TAPSE (M-mode): 1.5 cm LEFT ATRIUM  Index        RIGHT ATRIUM           Index LA diam:        4.80 cm 2.31 cm/m   RA Area:     11.40 cm LA Vol (A2C):   51.6 ml 24.88 ml/m  RA Volume:   18.20 ml  8.78 ml/m LA Vol (A4C):   49.7 ml 23.96 ml/m LA Biplane Vol: 52.9 ml 25.51 ml/m  AORTIC VALVE AV Area (Vmax): 2.58 cm AV Vmax:        122.00 cm/s AV Peak Grad:   6.0 mmHg LVOT Vmax:      100.23 cm/s LVOT Vmean:     68.733 cm/s LVOT VTI:       0.165 m  AORTA Ao Root diam: 3.40 cm MITRAL VALVE MV Area (PHT): 6.07 cm    SHUNTS MV Decel Time: 125 msec    Systemic VTI:  0.17 m MR Peak grad: 67.9 mmHg    Systemic Diam: 2.00 cm MR Vmax:      412.00 cm/s MV E velocity: 65.50 cm/s Riley Lam MD Electronically signed by Riley Lam MD Signature Date/Time: 09/20/2023/5:17:41 PM    Final    VAS US DOPPLER PRE CABG Result Date: 09/20/2023 PREOPERATIVE VASCULAR EVALUATION Patient Name:  TERRIUS KIEBLER  Date of Exam:   09/20/2023 Medical Rec #: [id number]       Accession #:    [id number] Date of Birth: 1956-01-19       Patient Gender: M Patient Age:   43 years Exam Location:  [facility] Procedure:      VAS US DOPPLER PRE CABG Referring Phys: Viviann Spare HENDRICKSON --------------------------------------------------------------------------------  Indications:  Pre-CABG. Risk Factors: Hypertension, hyperlipidemia, coronary artery disease. Performing Technologist: Shona Simpson  Examination Guidelines: Curran Lenderman complete evaluation includes B-mode imaging, spectral Doppler, color Doppler, and power Doppler as needed of all accessible portions of each vessel. Bilateral testing is considered an integral part of Rahim Astorga complete examination. Limited examinations for reoccurring indications may be performed as noted.  Right Carotid Findings: +----------+--------+--------+--------+--------+------------------+           PSV cm/sEDV cm/sStenosisDescribeComments           +----------+--------+--------+--------+--------+------------------+ CCA Prox  93      18                                         +----------+--------+--------+--------+--------+------------------+ CCA Distal95      21                      intimal thickening +----------+--------+--------+--------+--------+------------------+ ICA Prox  95      27      1-39%   calcific                   +----------+--------+--------+--------+--------+------------------+ ICA Mid   86      25                                         +----------+--------+--------+--------+--------+------------------+ ICA Distal95      25                                         +----------+--------+--------+--------+--------+------------------+  ECA       222     17      >50%    calcific                   +----------+--------+--------+--------+--------+------------------+ +----------+--------+-------+--------+------------+           PSV cm/sEDV cmsDescribeArm Pressure +----------+--------+-------+--------+------------+ Subclavian112     0                           +----------+--------+-------+--------+------------+  +---------+--------+--+--------+--+ VertebralPSV cm/s55EDV cm/s14 +---------+--------+--+--------+--+ Left Carotid Findings: +----------+--------+--------+--------+--------+------------------+           PSV cm/sEDV cm/sStenosisDescribeComments           +----------+--------+--------+--------+--------+------------------+ CCA Prox  130     27                                         +----------+--------+--------+--------+--------+------------------+ CCA Distal104     20                      intimal thickening +----------+--------+--------+--------+--------+------------------+ ICA Prox  83      19      1-39%   calcific                   +----------+--------+--------+--------+--------+------------------+ ICA Distal85      23                                         +----------+--------+--------+--------+--------+------------------+ ECA       138     18                                         +----------+--------+--------+--------+--------+------------------+ +----------+--------+--------+--------+------------+ SubclavianPSV cm/sEDV cm/sDescribeArm Pressure +----------+--------+--------+--------+------------+           131     0                            +----------+--------+--------+--------+------------+ +---------+--------+--+--------+--+ VertebralPSV cm/s72EDV cm/s21 +---------+--------+--+--------+--+  ABI Findings: +------------------+-----+---------+ Rt Pressure (mmHg)IndexWaveform  +------------------+-----+---------+ 170                    triphasic +------------------+-----+---------+ 171               1.01 biphasic  +------------------+-----+---------+ 169               0.99 biphasic  +------------------+-----+---------+ +------------------+-----+---------+ Lt Pressure (mmHg)IndexWaveform  +------------------+-----+---------+ 161                              +------------------+-----+---------+ 178               1.05  triphasic +------------------+-----+---------+ 174               1.02 biphasic  +------------------+-----+---------+ +-------+---------------+ ABI/TBIToday's ABI/TBI +-------+---------------+ Right  1.01            +-------+---------------+ Left   1.05            +-------+---------------+  Right Doppler Findings: +--------+--------+---------+ Site    PressureDoppler   +--------+--------+---------+ Brachial170     triphasic +--------+--------+---------+  Radial          triphasic +--------+--------+---------+ Ulnar           triphasic +--------+--------+---------+  Left Doppler Findings: +----------+--------+---------+ Site      PressureDoppler   +----------+--------+---------+ Subclavian        triphasic +----------+--------+---------+ Brachial  161               +----------+--------+---------+ Radial            triphasic +----------+--------+---------+ Ulnar             triphasic +----------+--------+---------+  Summary: Right Carotid: Velocities in the right ICA are consistent with Azrael Huss 1-39% stenosis.                The ECA appears >50% stenosed. Left Carotid: Velocities in the left ICA are consistent with Ascencion Stegner 1-39% stenosis. Vertebrals:  Bilateral vertebral arteries demonstrate antegrade flow. Subclavians: Normal flow hemodynamics were seen in bilateral subclavian              arteries. Right ABI: Resting right ankle-brachial index is within normal range. Left ABI: Resting left ankle-brachial index is within normal range. Right Upper Extremity: Doppler waveforms decrease 50% with right radial compression. Doppler waveforms decrease >50% with right ulnar compression. Left Upper Extremity: Doppler waveforms decrease >50% with left radial compression. Doppler waveforms remain within normal limits with left ulnar compression.     Preliminary    CARDIAC CATHETERIZATION Addendum Date: 09/19/2023 Left Heart Catheterization 09/19/23: Hemodynamic data: LV 122/0, EDP 10  mmHg.  Ao 108/61, mean 79 mmHg.  No pressure gradient across the aortic valve. Angiographic data: Severe coronary calcification of all the coronary arteries. LV: Marked global hypokinesis.  LVEF 25 to at most 30%. LM: Calcified but large-caliber vessel.  No luminal obstruction. LAD: Severely calcified throughout.  Proximal LAD has eccentric 70 to 80% stenosis. Small D1 and D2 with high-grade ostial stenosis of 90% and diffuse disease.  Moderate size D3 with ostial 99% stenosis.  LAD CTO after D3 origin.  Distal LAD collateralized by ipsilateral and contralateral collaterals.  Distal LAD appears bypassable. LCx: Dominant.  Moderate disease in the ostium and proximal segment of 40%.  Very tiny OM1 and 2 with severe disease, large OM 3 with ostial 90% stenosis.  Distal CX has mild disease. RI: CTO in the ostium.  Ipsilateral collaterals.  The vessel faintly and appears to be small to moderate caliber vessel however large distribution. RCA: Nondominant with diffuse disease with Adi Doro proximal 90% focal stenosis.  Gives collaterals to distal LAD. Impression and recommendations: Severe diffuse calcific three-vessel coronary artery disease.  Severely reduced LVEF.  Will refer for CT surgery for consideration for CABG.  Result Date: 09/19/2023 Images from the original result were not included. Left Heart Catheterization 09/19/23: Hemodynamic data: LV 122/0, EDP 10 mmHg.  Ao 108/61, mean 79 mmHg.  No pressure gradient across the aortic valve. Angiographic data: Severe coronary calcification of all the coronary arteries. LV: Marked global hypokinesis.  LVEF 25 to at most 30%. LM: Calcified but large-caliber vessel.  No luminal obstruction. LAD: Severely calcified throughout.  Proximal LAD has eccentric 70 to 80% stenosis. Small D1 and D2 with high-grade ostial stenosis of 90% and diffuse disease.  Moderate size D3, free of disease.  LAD CTO after D3 origin.  Distal LAD collateralized by ipsilateral and contralateral  collaterals.  Distal LAD appears bypassable. LCx: Dominant.  Moderate disease in the ostium and proximal segment of 40%.  Very tiny OM1 and 2 with  severe disease, large OM 3 with ostial 90% stenosis.  Distal CX has mild disease. RI: CTO in the ostium.  Ipsilateral collaterals.  The vessel faintly and appears to be small to moderate caliber vessel however large distribution. RCA: Nondominant with diffuse disease with Bevin Mayall proximal 90% focal stenosis.  Gives collaterals to distal LAD. Impression and recommendations: Severe diffuse calcific three-vessel coronary artery disease.  Severely reduced LVEF.  Will refer for CT surgery for consideration for CABG.   DG Chest Port 1 View Result Date: 09/18/2023 CLINICAL DATA:  Hypoxia EXAM: PORTABLE CHEST 1 VIEW COMPARISON:  Film from earlier in the same day. FINDINGS: Cardiac shadow is stable. Mild aortic calcifications are seen. Lungs are well aerated bilaterally. Increased interstitial changes are noted consistent with mild edema. No bony abnormality is seen. IMPRESSION: Mild interstitial edema.  No acute infiltrate is seen. Electronically Signed   By: Alcide Clever M.D.   On: 09/18/2023 22:28        Scheduled Meds:  aspirin EC  81 mg Oral Daily   carvedilol  3.125 mg Oral BID WC   pantoprazole  40 mg Oral Daily   rosuvastatin  20 mg Oral Daily   sodium chloride flush  3 mL Intravenous Q12H   Continuous Infusions:  heparin 1,100 Units/hr (09/20/23 0046)   nitroGLYCERIN 20 mcg/min (09/20/23 1512)     LOS: 2 days    Time spent: over 30 min     Lacretia Nicks, MD [facility]   To contact the attending provider between 7A-7P or the covering provider during after hours 7P-7A, please log into the web site [email or web address] and access using universal [place] password for that web site. If you do not have the password, please call the hospital operator.  09/20/2023, 6:37 PM

## 2023-09-20 NOTE — Progress Notes (Signed)
PHARMACY - ANTICOAGULATION CONSULT NOTE  Pharmacy Consult for heparin Indication: chest pain/ACS  Allergies  Allergen Reactions   Amoxicillin-Pot Clavulanate Nausea And Vomiting   Erythromycin Nausea And Vomiting   Latex Swelling and Other (See Comments)    Lips swell, but no breathing issues    Patient Measurements: Height: 5\' 10"  (177.8 cm) Weight: 89.4 kg (197 lb) IBW/kg (Calculated) : 73 HEPARIN DW (KG): 89.4   Vital Signs: Temp: 97.6 F (36.4 C) (12/21 0322) Temp Source: Oral (12/21 0322) BP: 141/72 (12/21 0831) Pulse Rate: 69 (12/21 0322)  Labs: Recent Labs    09/18/23 1512 09/18/23 1902 09/19/23 0329 09/19/23 0504 09/20/23 0319 09/20/23 0951  HGB 14.8  --  17.0  --  15.7  --   HCT 44.6  --  51.1  --  46.7  --   PLT 152  --  179  --  161  --   APTT 49*  --  119*  --  120*  --   HEPARINUNFRC >1.10*  --   --   --  0.67 0.67  CREATININE 0.94  --  1.11  --  1.00  --   TROPONINIHS 771* 6,408*  --  7,526*  --   --     Estimated Creatinine Clearance: 78.5 mL/min (by C-G formula based on SCr of 1 mg/dL).   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 Pt s/p LHC with mvCAD, planning for CABG referral. Pt on apixaban PTA for hx PE.  Heparin level therapeutic at 0.67 on 1100 units/hr. CBC stable, no signs of bleeding noted.   Goal of Therapy:  Heparin level 0.3-0.7 units/ml aPTT 66-102 seconds Monitor platelets by anticoagulation protocol: Yes   Plan:  Continue heparin 1100 units/hr  Daily heparin level and CBC Monitor for s/sx of bleeding  F/y CVTS plans   Thank you for involving pharmacy in the patient's care.   Theotis Burrow, PharmD PGY1 Acute Care Pharmacy  Resident  09/20/2023 11:27 AM

## 2023-09-20 NOTE — Progress Notes (Signed)
Carotid arterial duplex completed. Please see CV Procedures for preliminary results.  Shona Simpson, RVT 09/20/23 10:04 AM

## 2023-09-20 NOTE — Progress Notes (Signed)
  Echocardiogram 2D Echocardiogram has been performed.  Lucendia Herrlich 09/20/2023, 5:09 PM

## 2023-09-20 NOTE — Progress Notes (Signed)
CARDIAC REHAB PHASE 1 [phone] Open heart surgery book and O[facility] handout given. Reviewed Safe Movement After Surgery information in the book. Discussed the importance of mobility after procedure. Discussed IS use, and patient is familiar with this. Instructed patient and son on how to watch the Preparing for OHS video. Will continue to follow.  Artist Pais, MS, ACSM CEP 09/20/23 1131

## 2023-09-20 NOTE — Progress Notes (Signed)
PHARMACY - ANTICOAGULATION CONSULT NOTE  Pharmacy Consult for heparin Indication:  CAD awaiting CABG with h/o VTE  Labs: Recent Labs    09/18/23 1512 09/18/23 1902 09/19/23 0329 09/19/23 0504 09/20/23 0319  HGB 14.8  --  17.0  --  15.7  HCT 44.6  --  51.1  --  46.7  PLT 152  --  179  --  161  APTT 49*  --  119*  --  120*  HEPARINUNFRC >1.10*  --   --   --  0.67  CREATININE 0.94  --  1.11  --  1.00  TROPONINIHS 771* 6,408*  --  7,526*  --    Assessment/Plan:  67yo male therapeutic on heparin after resuming s/p cath. Will continue infusion at current rate of 1100 units/hr and confirm stable with additional level.  Vernard Gambles, PharmD, BCPS 09/20/2023 5:23 AM

## 2023-09-21 DIAGNOSIS — E785 Hyperlipidemia, unspecified: Secondary | ICD-10-CM | POA: Diagnosis not present

## 2023-09-21 DIAGNOSIS — I255 Ischemic cardiomyopathy: Secondary | ICD-10-CM

## 2023-09-21 DIAGNOSIS — I10 Essential (primary) hypertension: Secondary | ICD-10-CM | POA: Diagnosis not present

## 2023-09-21 DIAGNOSIS — F419 Anxiety disorder, unspecified: Secondary | ICD-10-CM

## 2023-09-21 DIAGNOSIS — I214 Non-ST elevation (NSTEMI) myocardial infarction: Secondary | ICD-10-CM | POA: Diagnosis not present

## 2023-09-21 LAB — CBC WITH DIFFERENTIAL/PLATELET
Abs Immature Granulocytes: 0.02 10*3/uL (ref 0.00–0.07)
Basophils Absolute: 0 10*3/uL (ref 0.0–0.1)
Basophils Relative: 0 %
Eosinophils Absolute: 0.2 10*3/uL (ref 0.0–0.5)
Eosinophils Relative: 2 %
HCT: 45.6 % (ref 39.0–52.0)
Hemoglobin: 15.3 g/dL (ref 13.0–17.0)
Immature Granulocytes: 0 %
Lymphocytes Relative: 26 %
Lymphs Abs: 2 10*3/uL (ref 0.7–4.0)
MCH: 30.7 pg (ref 26.0–34.0)
MCHC: 33.6 g/dL (ref 30.0–36.0)
MCV: 91.4 fL (ref 80.0–100.0)
Monocytes Absolute: 0.9 10*3/uL (ref 0.1–1.0)
Monocytes Relative: 12 %
Neutro Abs: 4.7 10*3/uL (ref 1.7–7.7)
Neutrophils Relative %: 60 %
Platelets: 145 10*3/uL — ABNORMAL LOW (ref 150–400)
RBC: 4.99 MIL/uL (ref 4.22–5.81)
RDW: 13.4 % (ref 11.5–15.5)
WBC: 7.8 10*3/uL (ref 4.0–10.5)
nRBC: 0 % (ref 0.0–0.2)

## 2023-09-21 LAB — COMPREHENSIVE METABOLIC PANEL
ALT: 10 U/L (ref 0–44)
AST: 32 U/L (ref 15–41)
Albumin: 3.5 g/dL (ref 3.5–5.0)
Alkaline Phosphatase: 49 U/L (ref 38–126)
Anion gap: 10 (ref 5–15)
BUN: 16 mg/dL (ref 8–23)
CO2: 24 mmol/L (ref 22–32)
Calcium: 9.7 mg/dL (ref 8.9–10.3)
Chloride: 104 mmol/L (ref 98–111)
Creatinine, Ser: 1.06 mg/dL (ref 0.61–1.24)
GFR, Estimated: >60 mL/min (ref >60)
Glucose, Bld: 90 mg/dL (ref 70–99)
Potassium: 3.7 mmol/L (ref 3.5–5.1)
Sodium: 138 mmol/L (ref 135–145)
Total Bilirubin: 1.2 mg/dL — ABNORMAL HIGH (ref <1.2)
Total Protein: 5.8 g/dL — ABNORMAL LOW (ref 6.5–8.1)

## 2023-09-21 LAB — PHOSPHORUS: Phosphorus: 3.6 mg/dL (ref 2.5–4.6)

## 2023-09-21 LAB — VAS US DOPPLER PRE CABG
Left ABI: 1.05
Right ABI: 1.01

## 2023-09-21 LAB — HEPARIN LEVEL (UNFRACTIONATED): Heparin Unfractionated: 0.54 [IU]/mL (ref 0.30–0.70)

## 2023-09-21 LAB — MAGNESIUM: Magnesium: 1.7 mg/dL (ref 1.7–2.4)

## 2023-09-21 MED ORDER — DULOXETINE HCL 30 MG PO CPEP
30.0000 mg | ORAL_CAPSULE | Freq: Every day | ORAL | Status: DC
  Administered 2023-09-21 – 2023-09-22 (×2): 30 mg via ORAL
  Filled 2023-09-21 (×2): qty 1

## 2023-09-21 MED ORDER — LORAZEPAM 1 MG PO TABS
1.0000 mg | ORAL_TABLET | Freq: Four times a day (QID) | ORAL | Status: DC | PRN
  Administered 2023-09-21 – 2023-09-23 (×4): 1 mg via ORAL
  Filled 2023-09-21 (×4): qty 1

## 2023-09-21 NOTE — Assessment & Plan Note (Signed)
Continue blood pressure control with carvedilol.  

## 2023-09-21 NOTE — Progress Notes (Signed)
  Progress Note   Patient: Jesse Coleman YQM:[id number] DOB: Dec 10, 1955 DOA: 09/18/2023     3 DOS: the patient was seen and examined on 09/21/2023   Brief hospital course: Jesse Coleman  is a 67 y.o. male, with past medical history of known CAD, ischemic cardiomyopathy, provoked PE, on Eliquis, hypertension, hyperlipidemia, adrenal adenoma on medical management who presented to the ED with chest pain, found to have NSTEMI.  Now s/p cath with severe 3 vessel CAD and severely reduced EF.  Planning for CABG.   Assessment and Plan: * NSTEMI (non-ST elevated myocardial infarction) (HCC) Severe diffuse calcific 3 vessel coronary artery disease.   Plan for coronary artery bypass grafting on this admission.  In order to be pain free patient needs to be on IV nitroglycerin.  Plan to continue anticoagulation IV heparin.  Anticoagulation with aspirin and B blocker with carvedilol.   Essential hypertension Continue blood pressure control with carvedilol.   Hyperlipidemia LDL goal <70 Continue with statin therapy.   Ischemic cardiomyopathy Echocardiogram with mild reduced LV systolic function with  EF 40 to 45%.   Clinically euvolemic. Plan to continue carvedilol, entresoto is on hold for now.  Continue blood pressure monitoring.    Pulmonary embolism (HCC) Continue anticoagulation with IV heparin.   Anxiety Continue lorazepam as needed, home dose is 1 mg, and will resume duloxetine.         Subjective: Patient is feeling well, has no chest pain or dyspnea,. Positive intermittent anxiety   Physical Exam: Vitals:   09/21/23 0644 09/21/23 0737 09/21/23 0822 09/21/23 1512  BP: 133/75 133/76 139/86 117/73  Pulse: 68 72 91 72  Resp: 16 16  16   Temp: 98 F (36.7 C) 97.8 F (36.6 C)  98.1 F (36.7 C)  TempSrc: Oral Oral  Oral  SpO2:  93%  96%  Weight:      Height:       Neurology awake and alert ENT With mild pallor Cardiovascular with S1 and S2 present and regular with no  gallops, rubs or murmurs Respiratory with no rales or wheezing, no rhonchi Abdomen with no distention  No lower extremity edema  Data Reviewed:    Family Communication: I spoke with patient's wife at the bedside, we talked in detail about patient's condition, plan of care and prognosis and all questions were addressed.   Disposition: Status is: Inpatient Remains inpatient appropriate because: pending CABG   Planned Discharge Destination: Home     Author: Coralie Keens, MD 09/21/2023 3:27 PM  For on call review [email or web address].

## 2023-09-21 NOTE — Assessment & Plan Note (Signed)
Continue lorazepam as needed, home dose is 1 mg, and will resume duloxetine.

## 2023-09-21 NOTE — Assessment & Plan Note (Signed)
Echocardiogram with mild reduced LV systolic function with  EF 40 to 45%.   Clinically euvolemic. Plan to continue carvedilol, entresoto is on hold for now.  Continue blood pressure monitoring.

## 2023-09-21 NOTE — Progress Notes (Signed)
   Patient Name: Jesse Coleman Date of Encounter: 09/21/2023 [facility] Cardiologist: Donato Schultz, MD   Interval Summary  .    Chest pain has resolved.   He has had minimal sleep.  Discussed the issues going on with his family.  Vital Signs .    Vitals:   09/20/23 2344 09/21/23 0644 09/21/23 0737 09/21/23 0822  BP:  133/75 133/76 139/86  Pulse:  68 72 91  Resp: 17 16 16    Temp:  98 F (36.7 C) 97.8 F (36.6 C)   TempSrc: Oral Oral Oral   SpO2:   93%   Weight:      Height:        Intake/Output Summary (Last 24 hours) at 09/21/2023 0856 Last data filed at 09/21/2023 0400 Gross per 24 hour  Intake 687.96 ml  Output --  Net 687.96 ml      09/18/2023    2:59 PM 09/01/2023    8:32 AM 05/14/2023   11:30 AM  Last 3 Weights  Weight (lbs) 197 lb 195 lb 197 lb  Weight (kg) 89.359 kg 88.451 kg 89.359 kg      Telemetry/ECG    Sinus Rhythm, PVCs - Personally Reviewed  Physical Exam .   GEN: No acute distress.   Neck: No JVD Cardiac: RRR, no murmurs, rubs, or gallops.  Respiratory: Clear to auscultation bilaterally. GI: Soft, nontender, non-distended  MS: No edema  Assessment & Plan .      NSTEMI CAD- hx medical management -- Presented with chest pain or outside blowing leaves in his yard.  High-sensitivity troponin peaked at 7526.  EKG showed sinus rhythm with left bundle branch block. 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  He is going through a lot of stress and this affects the way he has communicated.  At the end of the conversation he is calm and conversant.  Minimizing trips in the room to only necessary will help.  He should ambulate on the unit; if CP we will stop  For questions or updates, please contact [facility] Please consult [email or web address] for contact info under   Riley Lam, MD FASE [facility] Cardiologist [facility]  [address] [place], [address] [place], [place] [address] [phone]  8:56 AM

## 2023-09-21 NOTE — Assessment & Plan Note (Addendum)
Severe diffuse calcific 3 vessel coronary artery disease.   Plan for coronary artery bypass grafting on this admission.  In order to be pain free patient needs to be on IV nitroglycerin.  Plan to continue anticoagulation IV heparin.  Anticoagulation with aspirin and B blocker with carvedilol.

## 2023-09-21 NOTE — Hospital Course (Signed)
Jesse Coleman  is a 67 y.o. male, with past medical history of known CAD, ischemic cardiomyopathy, provoked PE, on Eliquis, hypertension, hyperlipidemia, adrenal adenoma on medical management who presented to the ED with chest pain, found to have NSTEMI.  Now s/p cath with severe 3 vessel CAD and severely reduced EF.  Planning for CABG.

## 2023-09-21 NOTE — Assessment & Plan Note (Signed)
Continue with statin therapy.  ?

## 2023-09-21 NOTE — Assessment & Plan Note (Signed)
-   Continue anticoagulation with IV heparin ?

## 2023-09-21 NOTE — Progress Notes (Signed)
PHARMACY - ANTICOAGULATION CONSULT NOTE  Pharmacy Consult for heparin Indication: chest pain/ACS  Allergies  Allergen Reactions   Amoxicillin-Pot Clavulanate Nausea And Vomiting   Erythromycin Nausea And Vomiting   Latex Swelling and Other (See Comments)    Lips swell, but no breathing issues    Patient Measurements: Height: 5\' 10"  (177.8 cm) Weight: 89.4 kg (197 lb) IBW/kg (Calculated) : 73 HEPARIN DW (KG): 89.4   Vital Signs: Temp: 97.8 F (36.6 C) (12/22 0737) Temp Source: Oral (12/22 0737) BP: 139/86 (12/22 0822) Pulse Rate: 91 (12/22 0822)  Labs: Recent Labs    09/18/23 1512 09/18/23 1902 09/19/23 0329 09/19/23 0504 09/20/23 0319 09/20/23 0951 09/21/23 0759  HGB 14.8  --  17.0  --  15.7  --  15.3  HCT 44.6  --  51.1  --  46.7  --  45.6  PLT 152  --  179  --  161  --  145*  APTT 49*  --  119*  --  120*  --   --   HEPARINUNFRC >1.10*  --   --   --  0.67 0.67 0.54  CREATININE 0.94  --  1.11  --  1.00  --   --   TROPONINIHS 771* 6,408*  --  7,526*  --   --   --     Estimated Creatinine Clearance: 78.5 mL/min (by C-G formula based on SCr of 1 mg/dL).   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 Pt s/p LHC with mvCAD, planning for CABG referral. Pt on apixaban PTA for hx PE.  Heparin level therapeutic at 0.54 on 1100 units/hr. Hgb 15.3 plt 145. No signs of bleeding noted.   Goal of Therapy:  Heparin level 0.3-0.7 units/ml aPTT 66-102 seconds Monitor platelets by anticoagulation protocol: Yes   Plan:  Continue heparin 1100 units/hr  Daily heparin level and CBC Monitor for s/sx of bleeding  F/y CVTS plans   Thank you for involving pharmacy in the patient's care.    Theotis Burrow, PharmD PGY1 Acute Care Pharmacy Resident  09/21/2023 8:34 AM

## 2023-09-22 ENCOUNTER — Encounter (HOSPITAL_COMMUNITY): Payer: Self-pay | Admitting: Cardiology

## 2023-09-22 DIAGNOSIS — I200 Unstable angina: Secondary | ICD-10-CM | POA: Diagnosis not present

## 2023-09-22 DIAGNOSIS — I25110 Atherosclerotic heart disease of native coronary artery with unstable angina pectoris: Secondary | ICD-10-CM | POA: Diagnosis not present

## 2023-09-22 DIAGNOSIS — I214 Non-ST elevation (NSTEMI) myocardial infarction: Secondary | ICD-10-CM | POA: Diagnosis not present

## 2023-09-22 DIAGNOSIS — E785 Hyperlipidemia, unspecified: Secondary | ICD-10-CM | POA: Diagnosis not present

## 2023-09-22 DIAGNOSIS — I10 Essential (primary) hypertension: Secondary | ICD-10-CM | POA: Diagnosis not present

## 2023-09-22 DIAGNOSIS — I2699 Other pulmonary embolism without acute cor pulmonale: Secondary | ICD-10-CM

## 2023-09-22 LAB — BLOOD GAS, ARTERIAL
Acid-base deficit: 1.9 mmol/L (ref 0.0–2.0)
Bicarbonate: 22.3 mmol/L (ref 20.0–28.0)
Drawn by: 39898
O2 Saturation: 99.2 %
Patient temperature: 36.7
pCO2 arterial: 35 mm[Hg] (ref 32–48)
pH, Arterial: 7.4 (ref 7.35–7.45)
pO2, Arterial: 104 mm[Hg] (ref 83–108)

## 2023-09-22 LAB — URINALYSIS, ROUTINE W REFLEX MICROSCOPIC
Bacteria, UA: NONE SEEN
Bilirubin Urine: NEGATIVE
Glucose, UA: NEGATIVE mg/dL
Ketones, ur: NEGATIVE mg/dL
Leukocytes,Ua: NEGATIVE
Nitrite: NEGATIVE
Protein, ur: NEGATIVE mg/dL
Specific Gravity, Urine: 1.024 (ref 1.005–1.030)
pH: 5 (ref 5.0–8.0)

## 2023-09-22 LAB — CBC
HCT: 43.4 % (ref 39.0–52.0)
Hemoglobin: 14.6 g/dL (ref 13.0–17.0)
MCH: 30.7 pg (ref 26.0–34.0)
MCHC: 33.6 g/dL (ref 30.0–36.0)
MCV: 91.2 fL (ref 80.0–100.0)
Platelets: 134 10*3/uL — ABNORMAL LOW (ref 150–400)
RBC: 4.76 MIL/uL (ref 4.22–5.81)
RDW: 13.3 % (ref 11.5–15.5)
WBC: 5.6 10*3/uL (ref 4.0–10.5)
nRBC: 0 % (ref 0.0–0.2)

## 2023-09-22 LAB — BASIC METABOLIC PANEL
Anion gap: 10 (ref 5–15)
BUN: 17 mg/dL (ref 8–23)
CO2: 22 mmol/L (ref 22–32)
Calcium: 9.5 mg/dL (ref 8.9–10.3)
Chloride: 104 mmol/L (ref 98–111)
Creatinine, Ser: 0.93 mg/dL (ref 0.61–1.24)
GFR, Estimated: >60 mL/min (ref >60)
Glucose, Bld: 92 mg/dL (ref 70–99)
Potassium: 3.8 mmol/L (ref 3.5–5.1)
Sodium: 136 mmol/L (ref 135–145)

## 2023-09-22 LAB — TYPE AND SCREEN
ABO/RH(D): O POS
Antibody Screen: NEGATIVE

## 2023-09-22 LAB — SURGICAL PCR SCREEN
MRSA, PCR: NEGATIVE
Staphylococcus aureus: NEGATIVE

## 2023-09-22 LAB — HEMOGLOBIN A1C
Hgb A1c MFr Bld: 5.1 % (ref 4.8–5.6)
Mean Plasma Glucose: 99.67 mg/dL

## 2023-09-22 LAB — PROTIME-INR
INR: 1.1 (ref 0.8–1.2)
Prothrombin Time: 14.6 s (ref 11.4–15.2)

## 2023-09-22 LAB — ABO/RH: ABO/RH(D): O POS

## 2023-09-22 LAB — HEPARIN LEVEL (UNFRACTIONATED): Heparin Unfractionated: 0.45 [IU]/mL (ref 0.30–0.70)

## 2023-09-22 MED ORDER — HEPARIN 30,000 UNITS/1000 ML (OHS) CELLSAVER SOLUTION
Status: DC
  Filled 2023-09-22: qty 1000

## 2023-09-22 MED ORDER — DIAZEPAM 5 MG PO TABS
5.0000 mg | ORAL_TABLET | Freq: Once | ORAL | Status: AC
  Administered 2023-09-23: 5 mg via ORAL
  Filled 2023-09-22: qty 1

## 2023-09-22 MED ORDER — NOREPINEPHRINE 4 MG/250ML-% IV SOLN
0.0000 ug/min | INTRAVENOUS | Status: AC
  Administered 2023-09-23: 2 ug/min via INTRAVENOUS
  Filled 2023-09-22: qty 250

## 2023-09-22 MED ORDER — CEFAZOLIN SODIUM-DEXTROSE 2-4 GM/100ML-% IV SOLN
2.0000 g | INTRAVENOUS | Status: DC
  Filled 2023-09-22: qty 100

## 2023-09-22 MED ORDER — CEFAZOLIN SODIUM-DEXTROSE 2-4 GM/100ML-% IV SOLN
2.0000 g | INTRAVENOUS | Status: AC
  Administered 2023-09-23 (×2): 2 g via INTRAVENOUS
  Filled 2023-09-22: qty 100

## 2023-09-22 MED ORDER — MAGNESIUM SULFATE 50 % IJ SOLN
40.0000 meq | INTRAMUSCULAR | Status: DC
  Filled 2023-09-22: qty 9.85

## 2023-09-22 MED ORDER — METOPROLOL TARTRATE 12.5 MG HALF TABLET
12.5000 mg | ORAL_TABLET | Freq: Once | ORAL | Status: AC
  Administered 2023-09-23: 12.5 mg via ORAL
  Filled 2023-09-22: qty 1

## 2023-09-22 MED ORDER — TRANEXAMIC ACID (OHS) PUMP PRIME SOLUTION
2.0000 mg/kg | INTRAVENOUS | Status: DC
  Filled 2023-09-22: qty 1.79

## 2023-09-22 MED ORDER — VANCOMYCIN HCL 1.5 G IV SOLR
1500.0000 mg | INTRAVENOUS | Status: AC
  Administered 2023-09-23: 1500 mg via INTRAVENOUS
  Filled 2023-09-22: qty 30

## 2023-09-22 MED ORDER — INSULIN REGULAR(HUMAN) IN NACL 100-0.9 UT/100ML-% IV SOLN
INTRAVENOUS | Status: AC
  Administered 2023-09-23: 1.2 [IU]/h via INTRAVENOUS
  Filled 2023-09-22: qty 100

## 2023-09-22 MED ORDER — EPINEPHRINE HCL 5 MG/250ML IV SOLN IN NS
0.0000 ug/min | INTRAVENOUS | Status: DC
  Filled 2023-09-22: qty 250

## 2023-09-22 MED ORDER — POTASSIUM CHLORIDE 2 MEQ/ML IV SOLN
80.0000 meq | INTRAVENOUS | Status: DC
  Filled 2023-09-22: qty 40

## 2023-09-22 MED ORDER — PLASMA-LYTE A IV SOLN
INTRAVENOUS | Status: DC
  Filled 2023-09-22: qty 5

## 2023-09-22 MED ORDER — CHLORHEXIDINE GLUCONATE CLOTH 2 % EX PADS
6.0000 | MEDICATED_PAD | Freq: Once | CUTANEOUS | Status: AC
  Administered 2023-09-23: 6 via TOPICAL

## 2023-09-22 MED ORDER — TRANEXAMIC ACID (OHS) BOLUS VIA INFUSION
15.0000 mg/kg | INTRAVENOUS | Status: AC
  Administered 2023-09-23: 1341 mg via INTRAVENOUS
  Filled 2023-09-22: qty 1341

## 2023-09-22 MED ORDER — TRANEXAMIC ACID 1000 MG/10ML IV SOLN
1.5000 mg/kg/h | INTRAVENOUS | Status: AC
  Administered 2023-09-23: 1.5 mg/kg/h via INTRAVENOUS
  Filled 2023-09-22: qty 25

## 2023-09-22 MED ORDER — BISACODYL 5 MG PO TBEC
5.0000 mg | DELAYED_RELEASE_TABLET | Freq: Once | ORAL | Status: AC
  Administered 2023-09-22: 5 mg via ORAL
  Filled 2023-09-22: qty 1

## 2023-09-22 MED ORDER — MILRINONE LACTATE IN DEXTROSE 20-5 MG/100ML-% IV SOLN
0.3000 ug/kg/min | INTRAVENOUS | Status: AC
  Administered 2023-09-23: .25 ug/kg/min via INTRAVENOUS
  Filled 2023-09-22: qty 100

## 2023-09-22 MED ORDER — DEXMEDETOMIDINE HCL IN NACL 400 MCG/100ML IV SOLN
0.1000 ug/kg/h | INTRAVENOUS | Status: AC
  Administered 2023-09-23: .4 ug/kg/h via INTRAVENOUS
  Filled 2023-09-22: qty 100

## 2023-09-22 MED ORDER — CHLORHEXIDINE GLUCONATE CLOTH 2 % EX PADS
6.0000 | MEDICATED_PAD | Freq: Once | CUTANEOUS | Status: AC
  Administered 2023-09-22: 6 via TOPICAL

## 2023-09-22 MED ORDER — MUPIROCIN 2 % EX OINT: 1.0000 | TOPICAL_OINTMENT | Freq: Two times a day (BID) | CUTANEOUS | Status: DC

## 2023-09-22 MED ORDER — VANCOMYCIN HCL 1.5 G IV SOLR: 1500.0000 mg | INTRAVENOUS | Status: DC

## 2023-09-22 MED ORDER — CHLORHEXIDINE GLUCONATE 0.12 % MT SOLN
15.0000 mL | Freq: Once | OROMUCOSAL | Status: AC
  Administered 2023-09-23: 15 mL via OROMUCOSAL
  Filled 2023-09-22: qty 15

## 2023-09-22 MED ORDER — PHENYLEPHRINE HCL-NACL 20-0.9 MG/250ML-% IV SOLN
30.0000 ug/min | INTRAVENOUS | Status: DC
  Filled 2023-09-22: qty 250

## 2023-09-22 MED ORDER — NITROGLYCERIN IN D5W 200-5 MCG/ML-% IV SOLN
2.0000 ug/min | INTRAVENOUS | Status: DC
  Filled 2023-09-22: qty 250

## 2023-09-22 NOTE — Progress Notes (Signed)
  Progress Note   Patient: Ralphel Wohlrab BJY:[id number] DOB: July 15, 1956 DOA: 09/18/2023     4 DOS: the patient was seen and examined on 09/22/2023   Brief hospital course: Anguel Leamy  is a 67 y.o. male, with past medical history of known CAD, ischemic cardiomyopathy, provoked PE, on Eliquis, hypertension, hyperlipidemia, adrenal adenoma on medical management who presented to the ED with chest pain, found to have NSTEMI.  Now s/p cath with severe 3 vessel CAD and severely reduced EF.  Planning for CABG tomorrow.  Remains overall stable.  Currently on heparin infusion.  Assessment and Plan: * NSTEMI (non-ST elevated myocardial infarction) (HCC) Severe diffuse calcific 3 vessel coronary artery disease.   Plan for coronary artery bypass grafting on this admission.  In order to be pain free patient needs to be on IV nitroglycerin.  Plan to continue anticoagulation IV heparin.  Anticoagulation with aspirin and B blocker with carvedilol.   Essential hypertension Continue blood pressure control with carvedilol.   Hyperlipidemia LDL goal <70 Continue with statin therapy.   Ischemic cardiomyopathy Echocardiogram with mild reduced LV systolic function with  EF 40 to 45%.   Clinically euvolemic. Plan to continue carvedilol, entresoto is on hold for now.  Continue blood pressure monitoring.    Pulmonary embolism (HCC) Continue anticoagulation with IV heparin.   Anxiety Continue lorazepam as needed, home dose is 1 mg, and will resume duloxetine.         Subjective: Patient seen and examined.  Denies any complaints.  Without any chest pain.  Waiting for surgery tomorrow.  Physical Exam: Vitals:   09/21/23 1512 09/21/23 2006 09/22/23 0658 09/22/23 1123  BP: 117/73 (!) 120/92 127/83 127/78  Pulse: 72 72 72 72  Resp: 16 16 16 16   Temp: 98.1 F (36.7 C) 98.7 F (37.1 C) (!) 97.5 F (36.4 C) 98 F (36.7 C)  TempSrc: Oral Oral Oral Oral  SpO2: 96% 94% 96% 95%  Weight:       Height:       General: Looks fairly comfortable.  On room air. Cardiovascular: S1-S2 normal.  Regular rate rhythm. Respiratory: Bilateral clear.  No added sounds. Gastrointestinal: Soft.  Nontender.  Bowel sound present. Ext: No swelling or edema.  No cyanosis. Neuro: Alert and awake.  Oriented.     Family Communication: None today at the bedside.   Disposition: Status is: Inpatient Remains inpatient appropriate because: pending CABG   Planned Discharge Destination: Home     Author: Dorcas Carrow, MD 09/22/2023 2:46 PM  For on call review [email or web address].

## 2023-09-22 NOTE — Progress Notes (Addendum)
Patient Name: Jesse Coleman Date of Encounter: 09/22/2023 [facility] Cardiologist: Donato Schultz, MD    Interval Summary  .    Patient reports feeling well today. He got a good night of sleep last night. Denies chest pain. Scheduled for CABG tomorrow   Vital Signs .    Vitals:   09/21/23 0822 09/21/23 1512 09/21/23 2006 09/22/23 0658  BP: 139/86 117/73 (!) 120/92 127/83  Pulse: 91 72 72 72  Resp:  16 16 16   Temp:  98.1 F (36.7 C) 98.7 F (37.1 C) (!) 97.5 F (36.4 C)  TempSrc:  Oral Oral Oral  SpO2:  96% 94% 96%  Weight:      Height:        Intake/Output Summary (Last 24 hours) at 09/22/2023 0739 Last data filed at 09/22/2023 0400 Gross per 24 hour  Intake 767.7 ml  Output --  Net 767.7 ml      09/18/2023    2:59 PM 09/01/2023    8:32 AM 05/14/2023   11:30 AM  Last 3 Weights  Weight (lbs) 197 lb 195 lb 197 lb  Weight (kg) 89.359 kg 88.451 kg 89.359 kg      Telemetry/ECG    NSR with occasional PVCs - Personally Reviewed  Physical Exam .   GEN: No acute distress.  Laying flat in the bed  Neck: No JVD Cardiac:  RRR, no murmurs, rubs, or gallops. Right radial cath site is soft, nontender. Respiratory: Clear to auscultation bilaterally. Normal work of breathing on room air  GI: Soft, nontender  MS: No edema in BLE   Assessment & Plan .     NSTEMI  CAD  - Patient previously had LHC in 2014 that showed 3 vessel CAD. He was managed medically at that time  - Presented this admission with chest pain that began when he was outside blowing leaves. hsTn peaked at 7526  - Underwent LHC on 12/20 that showed severe diffuse calcific three-vessel coronary artery disease. Referred to CT surgery for consideration of CABG  - Scheduled for CABG tomorrow. Currently chest pain free  - Continue ASA 81 mg daily, carvedilol 3.125 mg BID, crestor 20 mg daily   HFimpEF - EF was previously 40-45% in 2014  - Echocardiogram this admission showed EF 50-55% with regional  wall motion abnormalities - Currently euvolemic on exam  - Continue carvedilol 3.125 mg BID - Plan to further titrate GDMT after CABG   History of PE - Unprovoked  - Will need post procedural AC with appropriate   HLD  - Lipid panel this admission showed LDL 80, HDL 89, triglycerides 35, total cholesterol 176  - Continue crestor 20 mg daily   HTN  - BP well controlled on current medications   For questions or updates, please contact [facility] Please consult [email or web address] for contact info under        Signed, Jonita Albee, PA-C   Patient seen and examined. Agree with assessment and plan.  Patient currently feels well although earlier had had noticed 1/10 mild chest pain.  He continues to be on IV heparin and IV nitroglycerin.  Blood pressure stable.  No JVD.  Lungs clear.  Faint 1/6 systolic murmur.  Rhythm regular.  Abdomen soft.  No edema.  Echo Doppler study shows EF 50 to 55% with apical hypokinesis without thrombus.  There is mild MR.  Trivial AR.  Plan CABG revascularization tomorrow with Dr. [name].   Lennette Bihari, MD, [facility] 09/22/2023  2:36 PM

## 2023-09-22 NOTE — Progress Notes (Signed)
3 Days Post-Op Procedure(s) (LRB): LEFT HEART CATH AND CORONARY ANGIOGRAPHY (N/A) Subjective: No complaints this AM  Objective: Vital signs in last 24 hours: Temp:  [97.5 F (36.4 C)-98.7 F (37.1 C)] 97.5 F (36.4 C) (12/23 0658) Pulse Rate:  [72] 72 (12/23 0658) Cardiac Rhythm: Normal sinus rhythm (12/22 2002) Resp:  [16] 16 (12/23 0658) BP: (117-127)/(73-92) 127/83 (12/23 0658) SpO2:  [94 %-96 %] 96 % (12/23 0658)  Hemodynamic parameters for last 24 hours:    Intake/Output from previous day: 12/22 0701 - 12/23 0700 In: 767.7 [P.O.:360; I.V.:407.7] Out: -  Intake/Output this shift: No intake/output data recorded.  General appearance: alert, cooperative, and no distress Neurologic: intact Heart: regular rate and rhythm Lungs: clear to auscultation bilaterally  Lab Results: Recent Labs    09/21/23 0759 09/22/23 0638  WBC 7.8 5.6  HGB 15.3 14.6  HCT 45.6 43.4  PLT 145* 134*   BMET:  Recent Labs    09/21/23 0759 09/22/23 0638  NA 138 136  K 3.7 3.8  CL 104 104  CO2 24 22  GLUCOSE 90 92  BUN 16 17  CREATININE 1.06 0.93  CALCIUM 9.7 9.5    PT/INR: No results for input(s): "LABPROT", "INR" in the last 72 hours. ABG    Component Value Date/Time   HCO3 16.9 (L) 04/23/2019 0127   TCO2 18 (L) 04/23/2019 0127   ACIDBASEDEF 7.0 (H) 04/23/2019 0127   O2SAT 87.0 04/23/2019 0127   CBG (last 3)  No results for input(s): "GLUCAP" in the last 72 hours.  Assessment/Plan: S/P Procedure(s) (LRB): LEFT HEART CATH AND CORONARY ANGIOGRAPHY (N/A) - Severe 3 vessel CAD with ischemic cardiomyopathy For CABG tomorrow All questions answered Stop heparin at 2 AM   LOS: 4 days    Loreli Slot 09/22/2023

## 2023-09-22 NOTE — Progress Notes (Signed)
PHARMACY - ANTICOAGULATION CONSULT NOTE  Pharmacy Consult for heparin Indication: chest pain/ACS  Allergies  Allergen Reactions   Amoxicillin-Pot Clavulanate Nausea And Vomiting   Erythromycin Nausea And Vomiting   Latex Swelling and Other (See Comments)    Lips swell, but no breathing issues    Patient Measurements: Height: 5\' 10"  (177.8 cm) Weight: 89.4 kg (197 lb) IBW/kg (Calculated) : 73 HEPARIN DW (KG): 89.4   Vital Signs: Temp: 97.5 F (36.4 C) (12/23 0658) Temp Source: Oral (12/23 0658) BP: 127/83 (12/23 0658) Pulse Rate: 72 (12/23 0658)  Labs: Recent Labs    09/20/23 0319 09/20/23 0951 09/21/23 0759 09/22/23 0638  HGB 15.7  --  15.3 14.6  HCT 46.7  --  45.6 43.4  PLT 161  --  145* 134*  APTT 120*  --   --   --   HEPARINUNFRC 0.67 0.67 0.54 0.45  CREATININE 1.00  --  1.06 0.93    Estimated Creatinine Clearance: 84.4 mL/min (by C-G formula based on SCr of 0.93 mg/dL).   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 Pt s/p LHC with mvCAD, planning for CABG referral. Pt on apixaban PTA for hx PE.  Heparin level therapeutic at 0.45 on 1100 units/hr. H/H and pltc wnl.  Goal of Therapy:  Heparin level 0.3-0.7 units/ml aPTT 66-102 seconds Monitor platelets by anticoagulation protocol: Yes   Plan:  Continue heparin 1100 units/hr  Daily heparin level and CBC   Fredonia Highland, PharmD, [place], [facility] Clinical Pharmacist [phone] Please check AMION for all [facility] Pharmacy numbers 09/22/2023

## 2023-09-22 NOTE — Care Management Important Message (Signed)
Important Message  Patient Details  Name: Jesse Coleman MRN: [id number] Date of Birth: September 04, 1956   Important Message Given:  Yes - Medicare IM     Renie Ora 09/22/2023, 10:21 AM

## 2023-09-23 ENCOUNTER — Inpatient Hospital Stay (HOSPITAL_COMMUNITY): Payer: Medicare Other | Admitting: Certified Registered Nurse Anesthetist

## 2023-09-23 ENCOUNTER — Encounter (HOSPITAL_COMMUNITY)
Admission: EM | Disposition: A | Discharge status: Home / Self Care | Payer: Self-pay | Source: Home / Self Care | Attending: Thoracic Surgery (Cardiothoracic Vascular Surgery)

## 2023-09-23 ENCOUNTER — Other Ambulatory Visit: Payer: Self-pay

## 2023-09-23 ENCOUNTER — Inpatient Hospital Stay (HOSPITAL_COMMUNITY): Payer: Medicare Other

## 2023-09-23 ENCOUNTER — Encounter (HOSPITAL_COMMUNITY): Payer: Self-pay | Admitting: Internal Medicine

## 2023-09-23 DIAGNOSIS — I10 Essential (primary) hypertension: Secondary | ICD-10-CM | POA: Diagnosis not present

## 2023-09-23 DIAGNOSIS — I214 Non-ST elevation (NSTEMI) myocardial infarction: Secondary | ICD-10-CM

## 2023-09-23 DIAGNOSIS — I25110 Atherosclerotic heart disease of native coronary artery with unstable angina pectoris: Secondary | ICD-10-CM

## 2023-09-23 DIAGNOSIS — I2510 Atherosclerotic heart disease of native coronary artery without angina pectoris: Secondary | ICD-10-CM

## 2023-09-23 DIAGNOSIS — Z951 Presence of aortocoronary bypass graft: Secondary | ICD-10-CM

## 2023-09-23 HISTORY — PX: PATCH ANGIOPLASTY: SHX6230

## 2023-09-23 HISTORY — PX: TEE WITHOUT CARDIOVERSION: SHX5443

## 2023-09-23 HISTORY — PX: CORONARY ARTERY BYPASS GRAFT: SHX141

## 2023-09-23 LAB — POCT I-STAT 7, (LYTES, BLD GAS, ICA,H+H)
Acid-base deficit: 2 mmol/L (ref 0.0–2.0)
Acid-base deficit: 4 mmol/L — ABNORMAL HIGH (ref 0.0–2.0)
Acid-base deficit: 5 mmol/L — ABNORMAL HIGH (ref 0.0–2.0)
Acid-base deficit: 3 mmol/L — ABNORMAL HIGH (ref 0.0–2.0)
Acid-base deficit: 6 mmol/L — ABNORMAL HIGH (ref 0.0–2.0)
Acid-base deficit: 6 mmol/L — ABNORMAL HIGH (ref 0.0–2.0)
Acid-base deficit: 6 mmol/L — ABNORMAL HIGH (ref 0.0–2.0)
Acid-base deficit: 7 mmol/L — ABNORMAL HIGH (ref 0.0–2.0)
Bicarbonate: 23.7 mmol/L (ref 20.0–28.0)
Bicarbonate: 23.4 mmol/L (ref 20.0–28.0)
Bicarbonate: 20.3 mmol/L (ref 20.0–28.0)
Bicarbonate: 22 mmol/L (ref 20.0–28.0)
Bicarbonate: 21.3 mmol/L (ref 20.0–28.0)
Bicarbonate: 21.4 mmol/L (ref 20.0–28.0)
Bicarbonate: 20.6 mmol/L (ref 20.0–28.0)
Bicarbonate: 18.9 mmol/L — ABNORMAL LOW (ref 20.0–28.0)
Calcium, Ion: 1.28 mmol/L (ref 1.15–1.40)
Calcium, Ion: 1.1 mmol/L — ABNORMAL LOW (ref 1.15–1.40)
Calcium, Ion: 1.13 mmol/L — ABNORMAL LOW (ref 1.15–1.40)
Calcium, Ion: 1.14 mmol/L — ABNORMAL LOW (ref 1.15–1.40)
Calcium, Ion: 1.14 mmol/L — ABNORMAL LOW (ref 1.15–1.40)
Calcium, Ion: 1.25 mmol/L (ref 1.15–1.40)
Calcium, Ion: 1.31 mmol/L (ref 1.15–1.40)
Calcium, Ion: 1.17 mmol/L (ref 1.15–1.40)
HCT: 42 % (ref 39.0–52.0)
HCT: 31 % — ABNORMAL LOW (ref 39.0–52.0)
HCT: 31 % — ABNORMAL LOW (ref 39.0–52.0)
HCT: 30 % — ABNORMAL LOW (ref 39.0–52.0)
HCT: 30 % — ABNORMAL LOW (ref 39.0–52.0)
HCT: 28 % — ABNORMAL LOW (ref 39.0–52.0)
HCT: 28 % — ABNORMAL LOW (ref 39.0–52.0)
HCT: 25 % — ABNORMAL LOW (ref 39.0–52.0)
Hemoglobin: 14.3 g/dL (ref 13.0–17.0)
Hemoglobin: 10.5 g/dL — ABNORMAL LOW (ref 13.0–17.0)
Hemoglobin: 10.5 g/dL — ABNORMAL LOW (ref 13.0–17.0)
Hemoglobin: 10.2 g/dL — ABNORMAL LOW (ref 13.0–17.0)
Hemoglobin: 10.2 g/dL — ABNORMAL LOW (ref 13.0–17.0)
Hemoglobin: 9.5 g/dL — ABNORMAL LOW (ref 13.0–17.0)
Hemoglobin: 9.5 g/dL — ABNORMAL LOW (ref 13.0–17.0)
Hemoglobin: 8.5 g/dL — ABNORMAL LOW (ref 13.0–17.0)
O2 Saturation: 100 %
O2 Saturation: 100 %
O2 Saturation: 100 %
O2 Saturation: 100 %
O2 Saturation: 97 %
O2 Saturation: 93 %
O2 Saturation: 95 %
O2 Saturation: 93 %
Patient temperature: 35.4
Patient temperature: 36
Patient temperature: 99
Potassium: 3.5 mmol/L (ref 3.5–5.1)
Potassium: 3.8 mmol/L (ref 3.5–5.1)
Potassium: 5.3 mmol/L — ABNORMAL HIGH (ref 3.5–5.1)
Potassium: 5.3 mmol/L — ABNORMAL HIGH (ref 3.5–5.1)
Potassium: 4.3 mmol/L (ref 3.5–5.1)
Potassium: 4.2 mmol/L (ref 3.5–5.1)
Potassium: 4 mmol/L (ref 3.5–5.1)
Potassium: 3.5 mmol/L (ref 3.5–5.1)
Sodium: 136 mmol/L (ref 135–145)
Sodium: 140 mmol/L (ref 135–145)
Sodium: 136 mmol/L (ref 135–145)
Sodium: 137 mmol/L (ref 135–145)
Sodium: 139 mmol/L (ref 135–145)
Sodium: 139 mmol/L (ref 135–145)
Sodium: 140 mmol/L (ref 135–145)
Sodium: 142 mmol/L (ref 135–145)
TCO2: 25 mmol/L (ref 22–32)
TCO2: 25 mmol/L (ref 22–32)
TCO2: 21 mmol/L — ABNORMAL LOW (ref 22–32)
TCO2: 23 mmol/L (ref 22–32)
TCO2: 23 mmol/L (ref 22–32)
TCO2: 23 mmol/L (ref 22–32)
TCO2: 22 mmol/L (ref 22–32)
TCO2: 20 mmol/L — ABNORMAL LOW (ref 22–32)
pCO2 arterial: 44.9 mm[Hg] (ref 32–48)
pCO2 arterial: 50.8 mm[Hg] — ABNORMAL HIGH (ref 32–48)
pCO2 arterial: 35.5 mm[Hg] (ref 32–48)
pCO2 arterial: 37.8 mm[Hg] (ref 32–48)
pCO2 arterial: 48.2 mm[Hg] — ABNORMAL HIGH (ref 32–48)
pCO2 arterial: 44.5 mm[Hg] (ref 32–48)
pCO2 arterial: 40.8 mm[Hg] (ref 32–48)
pCO2 arterial: 37.2 mm[Hg] (ref 32–48)
pH, Arterial: 7.33 — ABNORMAL LOW (ref 7.35–7.45)
pH, Arterial: 7.272 — ABNORMAL LOW (ref 7.35–7.45)
pH, Arterial: 7.366 (ref 7.35–7.45)
pH, Arterial: 7.373 (ref 7.35–7.45)
pH, Arterial: 7.253 — ABNORMAL LOW (ref 7.35–7.45)
pH, Arterial: 7.281 — ABNORMAL LOW (ref 7.35–7.45)
pH, Arterial: 7.306 — ABNORMAL LOW (ref 7.35–7.45)
pH, Arterial: 7.315 — ABNORMAL LOW (ref 7.35–7.45)
pO2, Arterial: 498 mm[Hg] — ABNORMAL HIGH (ref 83–108)
pO2, Arterial: 357 mm[Hg] — ABNORMAL HIGH (ref 83–108)
pO2, Arterial: 259 mm[Hg] — ABNORMAL HIGH (ref 83–108)
pO2, Arterial: 334 mm[Hg] — ABNORMAL HIGH (ref 83–108)
pO2, Arterial: 109 mm[Hg] — ABNORMAL HIGH (ref 83–108)
pO2, Arterial: 70 mm[Hg] — ABNORMAL LOW (ref 83–108)
pO2, Arterial: 77 mm[Hg] — ABNORMAL LOW (ref 83–108)
pO2, Arterial: 74 mm[Hg] — ABNORMAL LOW (ref 83–108)

## 2023-09-23 LAB — POCT I-STAT, CHEM 8
BUN: 14 mg/dL (ref 8–23)
BUN: 11 mg/dL (ref 8–23)
BUN: 12 mg/dL (ref 8–23)
BUN: 13 mg/dL (ref 8–23)
BUN: 13 mg/dL (ref 8–23)
BUN: 11 mg/dL (ref 8–23)
Calcium, Ion: 1.29 mmol/L (ref 1.15–1.40)
Calcium, Ion: 1.15 mmol/L (ref 1.15–1.40)
Calcium, Ion: 1.26 mmol/L (ref 1.15–1.40)
Calcium, Ion: 1.12 mmol/L — ABNORMAL LOW (ref 1.15–1.40)
Calcium, Ion: 1.15 mmol/L (ref 1.15–1.40)
Calcium, Ion: 1.14 mmol/L — ABNORMAL LOW (ref 1.15–1.40)
Chloride: 102 mmol/L (ref 98–111)
Chloride: 104 mmol/L (ref 98–111)
Chloride: 106 mmol/L (ref 98–111)
Chloride: 106 mmol/L (ref 98–111)
Chloride: 107 mmol/L (ref 98–111)
Chloride: 106 mmol/L (ref 98–111)
Creatinine, Ser: 0.8 mg/dL (ref 0.61–1.24)
Creatinine, Ser: 0.7 mg/dL (ref 0.61–1.24)
Creatinine, Ser: 0.8 mg/dL (ref 0.61–1.24)
Creatinine, Ser: 0.8 mg/dL (ref 0.61–1.24)
Creatinine, Ser: 0.8 mg/dL (ref 0.61–1.24)
Creatinine, Ser: 0.8 mg/dL (ref 0.61–1.24)
Glucose, Bld: 99 mg/dL (ref 70–99)
Glucose, Bld: 121 mg/dL — ABNORMAL HIGH (ref 70–99)
Glucose, Bld: 99 mg/dL (ref 70–99)
Glucose, Bld: 130 mg/dL — ABNORMAL HIGH (ref 70–99)
Glucose, Bld: 121 mg/dL — ABNORMAL HIGH (ref 70–99)
Glucose, Bld: 133 mg/dL — ABNORMAL HIGH (ref 70–99)
HCT: 41 % (ref 39.0–52.0)
HCT: 30 % — ABNORMAL LOW (ref 39.0–52.0)
HCT: 39 % (ref 39.0–52.0)
HCT: 29 % — ABNORMAL LOW (ref 39.0–52.0)
HCT: 29 % — ABNORMAL LOW (ref 39.0–52.0)
HCT: 28 % — ABNORMAL LOW (ref 39.0–52.0)
Hemoglobin: 13.9 g/dL (ref 13.0–17.0)
Hemoglobin: 10.2 g/dL — ABNORMAL LOW (ref 13.0–17.0)
Hemoglobin: 13.3 g/dL (ref 13.0–17.0)
Hemoglobin: 9.9 g/dL — ABNORMAL LOW (ref 13.0–17.0)
Hemoglobin: 9.9 g/dL — ABNORMAL LOW (ref 13.0–17.0)
Hemoglobin: 9.5 g/dL — ABNORMAL LOW (ref 13.0–17.0)
Potassium: 3.6 mmol/L (ref 3.5–5.1)
Potassium: 3.5 mmol/L (ref 3.5–5.1)
Potassium: 5 mmol/L (ref 3.5–5.1)
Potassium: 5.4 mmol/L — ABNORMAL HIGH (ref 3.5–5.1)
Potassium: 5 mmol/L (ref 3.5–5.1)
Potassium: 4.3 mmol/L (ref 3.5–5.1)
Sodium: 137 mmol/L (ref 135–145)
Sodium: 138 mmol/L (ref 135–145)
Sodium: 144 mmol/L (ref 135–145)
Sodium: 137 mmol/L (ref 135–145)
Sodium: 138 mmol/L (ref 135–145)
Sodium: 140 mmol/L (ref 135–145)
TCO2: 26 mmol/L (ref 22–32)
TCO2: 19 mmol/L — ABNORMAL LOW (ref 22–32)
TCO2: 23 mmol/L (ref 22–32)
TCO2: 23 mmol/L (ref 22–32)
TCO2: 23 mmol/L (ref 22–32)
TCO2: 22 mmol/L (ref 22–32)

## 2023-09-23 LAB — SARS CORONAVIRUS 2 (TAT 6-24 HRS): SARS Coronavirus 2: NEGATIVE

## 2023-09-23 LAB — CBC
HCT: 43.8 % (ref 39.0–52.0)
HCT: 30.2 % — ABNORMAL LOW (ref 39.0–52.0)
HCT: 27.1 % — ABNORMAL LOW (ref 39.0–52.0)
Hemoglobin: 14.7 g/dL (ref 13.0–17.0)
Hemoglobin: 10 g/dL — ABNORMAL LOW (ref 13.0–17.0)
Hemoglobin: 9.2 g/dL — ABNORMAL LOW (ref 13.0–17.0)
MCH: 30.6 pg (ref 26.0–34.0)
MCH: 30.9 pg (ref 26.0–34.0)
MCH: 31 pg (ref 26.0–34.0)
MCHC: 33.6 g/dL (ref 30.0–36.0)
MCHC: 33.1 g/dL (ref 30.0–36.0)
MCHC: 33.9 g/dL (ref 30.0–36.0)
MCV: 91.3 fL (ref 80.0–100.0)
MCV: 93.2 fL (ref 80.0–100.0)
MCV: 91.2 fL (ref 80.0–100.0)
Platelets: 124 10*3/uL — ABNORMAL LOW (ref 150–400)
Platelets: 70 10*3/uL — ABNORMAL LOW (ref 150–400)
Platelets: 94 10*3/uL — ABNORMAL LOW (ref 150–400)
RBC: 4.8 MIL/uL (ref 4.22–5.81)
RBC: 3.24 MIL/uL — ABNORMAL LOW (ref 4.22–5.81)
RBC: 2.97 MIL/uL — ABNORMAL LOW (ref 4.22–5.81)
RDW: 13.2 % (ref 11.5–15.5)
RDW: 13.2 % (ref 11.5–15.5)
RDW: 13.2 % (ref 11.5–15.5)
WBC: 4.9 10*3/uL (ref 4.0–10.5)
WBC: 6.3 10*3/uL (ref 4.0–10.5)
WBC: 8.1 10*3/uL (ref 4.0–10.5)
nRBC: 0 % (ref 0.0–0.2)
nRBC: 0 % (ref 0.0–0.2)
nRBC: 0 % (ref 0.0–0.2)

## 2023-09-23 LAB — BASIC METABOLIC PANEL
Anion gap: 8 (ref 5–15)
BUN: 10 mg/dL (ref 8–23)
CO2: 25 mmol/L (ref 22–32)
Calcium: 8.3 mg/dL — ABNORMAL LOW (ref 8.9–10.3)
Chloride: 106 mmol/L (ref 98–111)
Creatinine, Ser: 0.92 mg/dL (ref 0.61–1.24)
GFR, Estimated: >60 mL/min (ref >60)
Glucose, Bld: 140 mg/dL — ABNORMAL HIGH (ref 70–99)
Potassium: 3.7 mmol/L (ref 3.5–5.1)
Sodium: 139 mmol/L (ref 135–145)

## 2023-09-23 LAB — POCT I-STAT EG7
Acid-base deficit: 12 mmol/L — ABNORMAL HIGH (ref 0.0–2.0)
Bicarbonate: 14.2 mmol/L — ABNORMAL LOW (ref 20.0–28.0)
Calcium, Ion: 0.89 mmol/L — CL (ref 1.15–1.40)
HCT: 23 % — ABNORMAL LOW (ref 39.0–52.0)
Hemoglobin: 7.8 g/dL — ABNORMAL LOW (ref 13.0–17.0)
O2 Saturation: 85 %
Potassium: 2.4 mmol/L — CL (ref 3.5–5.1)
Sodium: 145 mmol/L (ref 135–145)
TCO2: 15 mmol/L — ABNORMAL LOW (ref 22–32)
pCO2, Ven: 34.6 mm[Hg] — ABNORMAL LOW (ref 44–60)
pH, Ven: 7.222 — ABNORMAL LOW (ref 7.25–7.43)
pO2, Ven: 59 mm[Hg] — ABNORMAL HIGH (ref 32–45)

## 2023-09-23 LAB — ECHO INTRAOPERATIVE TEE
Height: 70 in
Weight: 2928 [oz_av]

## 2023-09-23 LAB — PROTIME-INR
INR: 1.7 — ABNORMAL HIGH (ref 0.8–1.2)
Prothrombin Time: 20.2 s — ABNORMAL HIGH (ref 11.4–15.2)

## 2023-09-23 LAB — HEPARIN LEVEL (UNFRACTIONATED): Heparin Unfractionated: >1.1 [IU]/mL — ABNORMAL HIGH (ref 0.30–0.70)

## 2023-09-23 LAB — HEMOGLOBIN AND HEMATOCRIT, BLOOD
HCT: 32.1 % — ABNORMAL LOW (ref 39.0–52.0)
Hemoglobin: 10.6 g/dL — ABNORMAL LOW (ref 13.0–17.0)

## 2023-09-23 LAB — PLATELET COUNT: Platelets: DECREASED 10*3/uL (ref 150–400)

## 2023-09-23 LAB — MAGNESIUM: Magnesium: 2.2 mg/dL (ref 1.7–2.4)

## 2023-09-23 LAB — LIPOPROTEIN A (LPA): Lipoprotein (a): 78.5 nmol/L — ABNORMAL HIGH (ref <75.0)

## 2023-09-23 LAB — APTT: aPTT: 44 s — ABNORMAL HIGH (ref 24–36)

## 2023-09-23 SURGERY — CORONARY ARTERY BYPASS GRAFTING (CABG)
Anesthesia: General | Site: Chest

## 2023-09-23 MED ORDER — ONDANSETRON HCL 4 MG/2ML IJ SOLN
4.0000 mg | Freq: Four times a day (QID) | INTRAMUSCULAR | Status: DC | PRN
  Administered 2023-09-24 – 2023-09-29 (×6): 4 mg via INTRAVENOUS
  Filled 2023-09-23 (×6): qty 2

## 2023-09-23 MED ORDER — ACETAMINOPHEN 160 MG/5ML PO SOLN: 1000.0000 mg | Freq: Four times a day (QID) | ORAL | Status: DC

## 2023-09-23 MED ORDER — ASPIRIN 81 MG PO CHEW: 324.0000 mg | CHEWABLE_TABLET | Freq: Every day | ORAL | Status: DC

## 2023-09-23 MED ORDER — ALFUZOSIN HCL ER 10 MG PO TB24
10.0000 mg | ORAL_TABLET | Freq: Every day | ORAL | Status: DC
  Administered 2023-09-24 – 2023-09-30 (×7): 10 mg via ORAL
  Filled 2023-09-23 (×7): qty 1

## 2023-09-23 MED ORDER — HEPARIN SODIUM (PORCINE) 1000 UNIT/ML IJ SOLN
INTRAMUSCULAR | Status: AC
  Filled 2023-09-23: qty 1

## 2023-09-23 MED ORDER — ASPIRIN 81 MG PO CHEW
324.0000 mg | CHEWABLE_TABLET | Freq: Once | ORAL | Status: AC
  Administered 2023-09-23: 324 mg via ORAL
  Filled 2023-09-23: qty 4

## 2023-09-23 MED ORDER — SODIUM CHLORIDE 0.9% FLUSH: 3.0000 mL | INTRAVENOUS | Status: DC | PRN

## 2023-09-23 MED ORDER — ORAL CARE MOUTH RINSE
15.0000 mL | OROMUCOSAL | Status: DC
  Administered 2023-09-23: 15 mL via OROMUCOSAL

## 2023-09-23 MED ORDER — SODIUM BICARBONATE 8.4 % IV SOLN
100.0000 meq | Freq: Once | INTRAVENOUS | Status: AC
  Administered 2023-09-23: 100 meq via INTRAVENOUS

## 2023-09-23 MED ORDER — SODIUM BICARBONATE 8.4 % IV SOLN
50.0000 meq | Freq: Once | INTRAVENOUS | Status: AC
  Administered 2023-09-23: 50 meq via INTRAVENOUS

## 2023-09-23 MED ORDER — CHLORHEXIDINE GLUCONATE CLOTH 2 % EX PADS
6.0000 | MEDICATED_PAD | Freq: Every day | CUTANEOUS | Status: DC
  Administered 2023-09-23 – 2023-09-24 (×2): 6 via TOPICAL

## 2023-09-23 MED ORDER — MIDAZOLAM HCL (PF) 5 MG/ML IJ SOLN
INTRAMUSCULAR | Status: DC | PRN
  Administered 2023-09-23: 1 mg via INTRAVENOUS
  Administered 2023-09-23: 3 mg via INTRAVENOUS

## 2023-09-23 MED ORDER — CHLORHEXIDINE GLUCONATE 0.12 % MT SOLN
15.0000 mL | OROMUCOSAL | Status: AC
  Administered 2023-09-23: 15 mL via OROMUCOSAL
  Filled 2023-09-23: qty 15

## 2023-09-23 MED ORDER — PROPOFOL 10 MG/ML IV BOLUS
INTRAVENOUS | Status: DC | PRN
  Administered 2023-09-23: 90 mg via INTRAVENOUS
  Administered 2023-09-23: 60 mg via INTRAVENOUS
  Administered 2023-09-23: 70 mg via INTRAVENOUS

## 2023-09-23 MED ORDER — PROPOFOL 10 MG/ML IV BOLUS
INTRAVENOUS | Status: AC
  Filled 2023-09-23: qty 20

## 2023-09-23 MED ORDER — SODIUM CHLORIDE 0.9 % IV SOLN: INTRAVENOUS | Status: AC

## 2023-09-23 MED ORDER — CEFAZOLIN SODIUM-DEXTROSE 2-4 GM/100ML-% IV SOLN
2.0000 g | Freq: Three times a day (TID) | INTRAVENOUS | Status: AC
  Administered 2023-09-23 – 2023-09-25 (×6): 2 g via INTRAVENOUS
  Filled 2023-09-23 (×6): qty 100

## 2023-09-23 MED ORDER — VANCOMYCIN HCL IN DEXTROSE 1-5 GM/200ML-% IV SOLN
1000.0000 mg | Freq: Once | INTRAVENOUS | Status: AC
  Administered 2023-09-23: 1000 mg via INTRAVENOUS
  Filled 2023-09-23: qty 200

## 2023-09-23 MED ORDER — ALBUMIN HUMAN 5 % IV SOLN
250.0000 mL | INTRAVENOUS | Status: AC | PRN
  Administered 2023-09-23 (×5): 12.5 g via INTRAVENOUS
  Filled 2023-09-23 (×3): qty 250

## 2023-09-23 MED ORDER — PROPOFOL 500 MG/50ML IV EMUL
INTRAVENOUS | Status: DC | PRN
Start: 2023-09-23 — End: 2023-09-23
  Administered 2023-09-23: 25 ug/kg/min via INTRAVENOUS

## 2023-09-23 MED ORDER — SODIUM CHLORIDE 0.9% FLUSH
3.0000 mL | INTRAVENOUS | Status: DC | PRN
Start: 2023-09-24 — End: 2023-09-25

## 2023-09-23 MED ORDER — ACETAMINOPHEN 160 MG/5ML PO SOLN
650.0000 mg | Freq: Once | ORAL | Status: AC
  Administered 2023-09-23: 650 mg
  Filled 2023-09-23: qty 20.3

## 2023-09-23 MED ORDER — METOCLOPRAMIDE HCL 5 MG/ML IJ SOLN
10.0000 mg | Freq: Four times a day (QID) | INTRAMUSCULAR | Status: AC
  Administered 2023-09-23 – 2023-09-24 (×6): 10 mg via INTRAVENOUS
  Filled 2023-09-23 (×6): qty 2

## 2023-09-23 MED ORDER — ONDANSETRON HCL 4 MG/2ML IJ SOLN
INTRAMUSCULAR | Status: DC | PRN
  Administered 2023-09-23: 8 mg via INTRAVENOUS

## 2023-09-23 MED ORDER — TRAMADOL HCL 50 MG PO TABS
50.0000 mg | ORAL_TABLET | ORAL | Status: DC | PRN
  Administered 2023-09-24 – 2023-09-26 (×6): 100 mg via ORAL
  Administered 2023-09-26: 50 mg via ORAL
  Administered 2023-09-26: 100 mg via ORAL
  Administered 2023-09-26: 50 mg via ORAL
  Administered 2023-09-27 – 2023-09-29 (×2): 100 mg via ORAL
  Filled 2023-09-23 (×11): qty 2

## 2023-09-23 MED ORDER — ASPIRIN 325 MG PO TBEC
325.0000 mg | DELAYED_RELEASE_TABLET | Freq: Every day | ORAL | Status: DC
  Administered 2023-09-24: 325 mg via ORAL
  Filled 2023-09-23: qty 1

## 2023-09-23 MED ORDER — NOREPINEPHRINE BITARTRATE 1 MG/ML IV SOLN
INTRAVENOUS | Status: DC | PRN
  Administered 2023-09-23 (×6): .5 mL via INTRAVENOUS

## 2023-09-23 MED ORDER — MIDAZOLAM HCL 2 MG/2ML IJ SOLN
INTRAMUSCULAR | Status: AC
  Filled 2023-09-23: qty 2

## 2023-09-23 MED ORDER — ROCURONIUM BROMIDE 10 MG/ML (PF) SYRINGE
PREFILLED_SYRINGE | INTRAVENOUS | Status: AC
  Filled 2023-09-23: qty 10

## 2023-09-23 MED ORDER — SODIUM CHLORIDE 0.9% FLUSH
3.0000 mL | Freq: Two times a day (BID) | INTRAVENOUS | Status: DC
  Administered 2023-09-24 (×2): 3 mL via INTRAVENOUS

## 2023-09-23 MED ORDER — SODIUM CHLORIDE 0.9 % IV SOLN: INTRAVENOUS | Status: DC | PRN

## 2023-09-23 MED ORDER — BISACODYL 5 MG PO TBEC
10.0000 mg | DELAYED_RELEASE_TABLET | Freq: Every day | ORAL | Status: DC
  Administered 2023-09-24 – 2023-09-30 (×6): 10 mg via ORAL
  Filled 2023-09-23 (×7): qty 2

## 2023-09-23 MED ORDER — BISACODYL 10 MG RE SUPP: 10.0000 mg | Freq: Every day | RECTAL | Status: DC

## 2023-09-23 MED ORDER — SODIUM CHLORIDE 0.45 % IV SOLN: INTRAVENOUS | Status: AC | PRN

## 2023-09-23 MED ORDER — SODIUM CHLORIDE 0.9% FLUSH
3.0000 mL | Freq: Two times a day (BID) | INTRAVENOUS | Status: DC
Start: 2023-09-23 — End: 2023-09-25
  Administered 2023-09-23 (×2): 10 mL via INTRAVENOUS
  Administered 2023-09-24 (×2): 3 mL via INTRAVENOUS

## 2023-09-23 MED ORDER — FENTANYL CITRATE (PF) 250 MCG/5ML IJ SOLN
INTRAMUSCULAR | Status: DC | PRN
  Administered 2023-09-23: 100 ug via INTRAVENOUS
  Administered 2023-09-23: 50 ug via INTRAVENOUS
  Administered 2023-09-23: 100 ug via INTRAVENOUS
  Administered 2023-09-23 (×2): 50 ug via INTRAVENOUS
  Administered 2023-09-23: 100 ug via INTRAVENOUS
  Administered 2023-09-23: 50 ug via INTRAVENOUS

## 2023-09-23 MED ORDER — PROPOFOL 1000 MG/100ML IV EMUL
INTRAVENOUS | Status: AC
  Filled 2023-09-23: qty 100

## 2023-09-23 MED ORDER — ORAL CARE MOUTH RINSE: 15.0000 mL | OROMUCOSAL | Status: DC | PRN

## 2023-09-23 MED ORDER — ROCURONIUM BROMIDE 10 MG/ML (PF) SYRINGE
PREFILLED_SYRINGE | INTRAVENOUS | Status: DC | PRN
  Administered 2023-09-23: 50 mg via INTRAVENOUS
  Administered 2023-09-23: 20 mg via INTRAVENOUS
  Administered 2023-09-23: 100 mg via INTRAVENOUS
  Administered 2023-09-23: 80 mg via INTRAVENOUS

## 2023-09-23 MED ORDER — METOPROLOL TARTRATE 12.5 MG HALF TABLET
12.5000 mg | ORAL_TABLET | Freq: Two times a day (BID) | ORAL | Status: DC
  Administered 2023-09-24 – 2023-09-30 (×13): 12.5 mg via ORAL
  Filled 2023-09-23 (×13): qty 1

## 2023-09-23 MED ORDER — POTASSIUM CHLORIDE 10 MEQ/50ML IV SOLN: 10.0000 meq | INTRAVENOUS | Status: AC

## 2023-09-23 MED ORDER — OXYCODONE HCL 5 MG PO TABS
5.0000 mg | ORAL_TABLET | ORAL | Status: DC | PRN
Start: 2023-09-23 — End: 2023-09-30
  Administered 2023-09-24 (×2): 5 mg via ORAL
  Filled 2023-09-23 (×2): qty 1

## 2023-09-23 MED ORDER — PHENYLEPHRINE 80 MCG/ML (10ML) SYRINGE FOR IV PUSH (FOR BLOOD PRESSURE SUPPORT)
PREFILLED_SYRINGE | INTRAVENOUS | Status: DC | PRN
  Administered 2023-09-23 (×2): 80 ug via INTRAVENOUS

## 2023-09-23 MED ORDER — PROTAMINE SULFATE 10 MG/ML IV SOLN
INTRAVENOUS | Status: DC | PRN
  Administered 2023-09-23: 290 mg via INTRAVENOUS

## 2023-09-23 MED ORDER — PLASMA-LYTE A IV SOLN: INTRAVENOUS | Status: DC | PRN

## 2023-09-23 MED ORDER — BROMOCRIPTINE MESYLATE 2.5 MG PO TABS
1.2500 mg | ORAL_TABLET | Freq: Every day | ORAL | Status: DC
  Administered 2023-09-24 – 2023-09-29 (×6): 1.25 mg via ORAL
  Filled 2023-09-23 (×8): qty 1

## 2023-09-23 MED ORDER — 0.9 % SODIUM CHLORIDE (POUR BTL) OPTIME
TOPICAL | Status: DC | PRN
  Administered 2023-09-23: 5000 mL

## 2023-09-23 MED ORDER — ACETAMINOPHEN 500 MG PO TABS
1000.0000 mg | ORAL_TABLET | Freq: Four times a day (QID) | ORAL | Status: DC
  Administered 2023-09-23 – 2023-09-25 (×6): 1000 mg via ORAL
  Filled 2023-09-23 (×6): qty 2

## 2023-09-23 MED ORDER — CALCIUM CHLORIDE 10 % IV SOLN
INTRAVENOUS | Status: DC | PRN
  Administered 2023-09-23 (×4): 200 mg via INTRAVENOUS

## 2023-09-23 MED ORDER — POTASSIUM CHLORIDE 10 MEQ/50ML IV SOLN
10.0000 meq | INTRAVENOUS | Status: AC
  Administered 2023-09-23 (×3): 10 meq via INTRAVENOUS
  Filled 2023-09-23 (×2): qty 50

## 2023-09-23 MED ORDER — MIDAZOLAM HCL 2 MG/2ML IJ SOLN
INTRAMUSCULAR | Status: AC
Start: 2023-09-23 — End: ?
  Filled 2023-09-23: qty 2

## 2023-09-23 MED ORDER — SODIUM CHLORIDE 0.9% FLUSH
3.0000 mL | Freq: Two times a day (BID) | INTRAVENOUS | Status: DC
  Administered 2023-09-23 (×2): 10 mL via INTRAVENOUS
  Administered 2023-09-24 (×2): 3 mL via INTRAVENOUS

## 2023-09-23 MED ORDER — CALCIUM CHLORIDE 10 % IV SOLN
1.0000 g | Freq: Once | INTRAVENOUS | Status: AC
  Administered 2023-09-23: 1 g via INTRAVENOUS

## 2023-09-23 MED ORDER — METOPROLOL TARTRATE 25 MG/10 ML ORAL SUSPENSION
12.5000 mg | Freq: Two times a day (BID) | ORAL | Status: DC
  Filled 2023-09-23 (×11): qty 5

## 2023-09-23 MED ORDER — LIDOCAINE 2% (20 MG/ML) 5 ML SYRINGE
INTRAMUSCULAR | Status: AC
  Filled 2023-09-23: qty 5

## 2023-09-23 MED ORDER — HEMOSTATIC AGENTS (NO CHARGE) OPTIME
TOPICAL | Status: DC | PRN
  Administered 2023-09-23: 1 via TOPICAL

## 2023-09-23 MED ORDER — MILRINONE LACTATE IN DEXTROSE 20-5 MG/100ML-% IV SOLN: 0.2500 ug/kg/min | INTRAVENOUS | Status: DC

## 2023-09-23 MED ORDER — DOCUSATE SODIUM 100 MG PO CAPS
200.0000 mg | ORAL_CAPSULE | Freq: Every day | ORAL | Status: DC
  Administered 2023-09-24 – 2023-09-30 (×7): 200 mg via ORAL
  Filled 2023-09-23 (×7): qty 2

## 2023-09-23 MED ORDER — MAGNESIUM SULFATE 4 GM/100ML IV SOLN
4.0000 g | Freq: Once | INTRAVENOUS | Status: AC
  Administered 2023-09-23: 4 g via INTRAVENOUS
  Filled 2023-09-23: qty 100

## 2023-09-23 MED ORDER — MORPHINE SULFATE (PF) 2 MG/ML IV SOLN
1.0000 mg | INTRAVENOUS | Status: DC | PRN
  Administered 2023-09-23 – 2023-09-24 (×5): 2 mg via INTRAVENOUS
  Filled 2023-09-23 (×5): qty 1

## 2023-09-23 MED ORDER — SODIUM BICARBONATE 8.4 % IV SOLN
INTRAVENOUS | Status: AC
  Filled 2023-09-23: qty 100

## 2023-09-23 MED ORDER — METOPROLOL TARTRATE 5 MG/5ML IV SOLN: 2.5000 mg | INTRAVENOUS | Status: DC | PRN

## 2023-09-23 MED ORDER — NITROGLYCERIN IN D5W 200-5 MCG/ML-% IV SOLN: 0.0000 ug/min | INTRAVENOUS | Status: DC

## 2023-09-23 MED ORDER — PANTOPRAZOLE SODIUM 40 MG PO TBEC
40.0000 mg | DELAYED_RELEASE_TABLET | Freq: Every day | ORAL | Status: DC
  Administered 2023-09-25 – 2023-09-30 (×6): 40 mg via ORAL
  Filled 2023-09-23 (×6): qty 1

## 2023-09-23 MED ORDER — MIDAZOLAM HCL 2 MG/2ML IJ SOLN: 2.0000 mg | INTRAMUSCULAR | Status: DC | PRN

## 2023-09-23 MED ORDER — VASOPRESSIN 20 UNIT/ML IV SOLN
INTRAVENOUS | Status: DC | PRN
  Administered 2023-09-23: 1 [IU] via INTRAVENOUS

## 2023-09-23 MED ORDER — PROTAMINE SULFATE 10 MG/ML IV SOLN
INTRAVENOUS | Status: AC
  Filled 2023-09-23: qty 25

## 2023-09-23 MED ORDER — PANTOPRAZOLE SODIUM 40 MG IV SOLR
40.0000 mg | Freq: Every day | INTRAVENOUS | Status: AC
  Administered 2023-09-23 – 2023-09-24 (×2): 40 mg via INTRAVENOUS
  Filled 2023-09-23 (×2): qty 10

## 2023-09-23 MED ORDER — INSULIN ASPART 100 UNIT/ML IJ SOLN: 0.0000 [IU] | INTRAMUSCULAR | Status: DC

## 2023-09-23 MED ORDER — FENTANYL CITRATE (PF) 250 MCG/5ML IJ SOLN
INTRAMUSCULAR | Status: AC
  Filled 2023-09-23: qty 5

## 2023-09-23 MED ORDER — ALBUMIN HUMAN 5 % IV SOLN: INTRAVENOUS | Status: DC | PRN

## 2023-09-23 MED ORDER — DULOXETINE HCL 30 MG PO CPEP
30.0000 mg | ORAL_CAPSULE | Freq: Every day | ORAL | Status: DC
  Administered 2023-09-24 – 2023-09-30 (×7): 30 mg via ORAL
  Filled 2023-09-23 (×7): qty 1

## 2023-09-23 MED ORDER — SODIUM CHLORIDE (PF) 0.9 % IJ SOLN: OROMUCOSAL | Status: DC | PRN

## 2023-09-23 MED ORDER — LACTATED RINGERS IV SOLN: INTRAVENOUS | Status: DC | PRN

## 2023-09-23 MED ORDER — ROCURONIUM BROMIDE 10 MG/ML (PF) SYRINGE
PREFILLED_SYRINGE | INTRAVENOUS | Status: AC
  Filled 2023-09-23: qty 20

## 2023-09-23 MED ORDER — HEPARIN SODIUM (PORCINE) 1000 UNIT/ML IJ SOLN
INTRAMUSCULAR | Status: DC | PRN
  Administered 2023-09-23: 27000 [IU] via INTRAVENOUS
  Administered 2023-09-23: 2000 [IU] via INTRAVENOUS

## 2023-09-23 MED ORDER — PHENYLEPHRINE HCL-NACL 20-0.9 MG/250ML-% IV SOLN: 0.0000 ug/min | INTRAVENOUS | Status: DC

## 2023-09-23 MED ORDER — NOREPINEPHRINE 4 MG/250ML-% IV SOLN
2.0000 ug/min | INTRAVENOUS | Status: DC
  Administered 2023-09-23: 14 ug/min via INTRAVENOUS
  Administered 2023-09-23: 15 ug/min via INTRAVENOUS
  Filled 2023-09-23 (×3): qty 250

## 2023-09-23 MED ORDER — INSULIN REGULAR(HUMAN) IN NACL 100-0.9 UT/100ML-% IV SOLN: INTRAVENOUS | Status: DC

## 2023-09-23 MED ORDER — DEXMEDETOMIDINE HCL IN NACL 400 MCG/100ML IV SOLN
0.0000 ug/kg/h | INTRAVENOUS | Status: DC
  Administered 2023-09-23: 0.4 ug/kg/h via INTRAVENOUS
  Filled 2023-09-23: qty 100

## 2023-09-23 MED ORDER — DEXTROSE 50 % IV SOLN: 0.0000 mL | INTRAVENOUS | Status: DC | PRN

## 2023-09-23 MED ORDER — CLEVIDIPINE BUTYRATE 0.5 MG/ML IV EMUL
INTRAVENOUS | Status: DC | PRN
  Administered 2023-09-23: 2 mg/h via INTRAVENOUS
  Administered 2023-09-23: 3 mg/h via INTRAVENOUS

## 2023-09-23 MED ORDER — CALCIUM GLUCONATE 10 % IV SOLN: 1.0000 g | Freq: Once | INTRAVENOUS | Status: DC

## 2023-09-23 SURGICAL SUPPLY — 89 items
BAG DECANTER FOR FLEXI CONT (MISCELLANEOUS) ×3 IMPLANT
BLADE CLIPPER SURG (BLADE) ×3 IMPLANT
BLADE STERNUM SYSTEM 6 (BLADE) ×3 IMPLANT
BNDG ELASTIC 4INX 5YD STR LF (GAUZE/BANDAGES/DRESSINGS) IMPLANT
BNDG ELASTIC 4X5.8 VLCR STR LF (GAUZE/BANDAGES/DRESSINGS) ×3 IMPLANT
BNDG ELASTIC 6INX 5YD STR LF (GAUZE/BANDAGES/DRESSINGS) IMPLANT
BNDG ELASTIC 6X5.8 VLCR STR LF (GAUZE/BANDAGES/DRESSINGS) ×3 IMPLANT
BNDG GAUZE DERMACEA FLUFF 4 (GAUZE/BANDAGES/DRESSINGS) ×3 IMPLANT
CANISTER SUCT 3000ML PPV (MISCELLANEOUS) ×3 IMPLANT
CANNULA EZ GLIDE AORTIC 21FR (CANNULA) ×3 IMPLANT
CANNULA MC2 2 STG 36/46 CONN (CANNULA) IMPLANT
CANNULA VESSEL 3MM BLUNT TIP (CANNULA) IMPLANT
CATH CPB KIT HENDRICKSON (MISCELLANEOUS) ×3 IMPLANT
CATH ROBINSON RED A/P 18FR (CATHETERS) ×3 IMPLANT
CATH THORACIC 36FR (CATHETERS) ×3 IMPLANT
CATH THORACIC 36FR RT ANG (CATHETERS) ×3 IMPLANT
CLIP FOGARTY SPRING 6M (CLIP) IMPLANT
CLIP TI MEDIUM 24 (CLIP) IMPLANT
CLIP TI WIDE RED SMALL 24 (CLIP) IMPLANT
CNTNR URN SCR LID CUP LEK RST (MISCELLANEOUS) IMPLANT
CONTAINER PROTECT SURGISLUSH (MISCELLANEOUS) ×6 IMPLANT
DERMABOND ADVANCED .7 DNX12 (GAUZE/BANDAGES/DRESSINGS) IMPLANT
DRAPE SRG 135X102X78XABS (DRAPES) ×3 IMPLANT
DRAPE WARM FLUID 44X44 (DRAPES) ×3 IMPLANT
DRSG COVADERM 4X14 (GAUZE/BANDAGES/DRESSINGS) ×3 IMPLANT
ELECT REM PT RETURN 9FT ADLT (ELECTROSURGICAL) ×6
ELECTRODE REM PT RTRN 9FT ADLT (ELECTROSURGICAL) ×6 IMPLANT
FELT TEFLON 1X6 (MISCELLANEOUS) ×6 IMPLANT
GAUZE 4X4 16PLY ~~LOC~~+RFID DBL (SPONGE) ×3 IMPLANT
GAUZE SPONGE 4X4 12PLY STRL (GAUZE/BANDAGES/DRESSINGS) ×6 IMPLANT
GAUZE SPONGE 4X4 12PLY STRL LF (GAUZE/BANDAGES/DRESSINGS) IMPLANT
GLOVE BIOGEL PI IND STRL 6 (GLOVE) IMPLANT
GLOVE BIOGEL PI IND STRL 6.5 (GLOVE) IMPLANT
GLOVE SS BIOGEL STRL SZ 7.5 (GLOVE) ×3 IMPLANT
GLOVE SURG SIGNA 7.5 PF LTX (GLOVE) ×9 IMPLANT
GLOVE SURG SS PI 7.5 STRL IVOR (GLOVE) IMPLANT
GOWN STRL REUS W/ TWL LRG LVL3 (GOWN DISPOSABLE) ×12 IMPLANT
GOWN STRL REUS W/ TWL XL LVL3 (GOWN DISPOSABLE) ×6 IMPLANT
HEMOSTAT POWDER SURGIFOAM 1G (HEMOSTASIS) ×9 IMPLANT
HEMOSTAT SURGICEL 2X14 (HEMOSTASIS) ×3 IMPLANT
INSERT FOGARTY XLG (MISCELLANEOUS) IMPLANT
KIT BASIN OR (CUSTOM PROCEDURE TRAY) ×3 IMPLANT
KIT SUCTION CATH 14FR (SUCTIONS) ×6 IMPLANT
KIT TURNOVER KIT B (KITS) ×3 IMPLANT
KIT VASOVIEW HEMOPRO 2 VH 4000 (KITS) ×3 IMPLANT
MARKER GRAFT CORONARY BYPASS (MISCELLANEOUS) ×9 IMPLANT
NS IRRIG 1000ML POUR BTL (IV SOLUTION) ×15 IMPLANT
PACK E OPEN HEART (SUTURE) ×3 IMPLANT
PACK OPEN HEART (CUSTOM PROCEDURE TRAY) ×3 IMPLANT
PAD ARMBOARD 7.5X6 YLW CONV (MISCELLANEOUS) ×6 IMPLANT
PAD ELECT DEFIB RADIOL ZOLL (MISCELLANEOUS) ×3 IMPLANT
PENCIL BUTTON HOLSTER BLD 10FT (ELECTRODE) ×3 IMPLANT
POSITIONER HEAD DONUT 9IN (MISCELLANEOUS) ×3 IMPLANT
PUNCH AORTIC ROTATE 4.0MM (MISCELLANEOUS) IMPLANT
PUNCH AORTIC ROTATE 4.5MM 8IN (MISCELLANEOUS) IMPLANT
PUNCH AORTIC ROTATE 5MM 8IN (MISCELLANEOUS) IMPLANT
SET MPS 3-ND DEL (MISCELLANEOUS) IMPLANT
SPONGE T-LAP 18X18 ~~LOC~~+RFID (SPONGE) ×12 IMPLANT
SPONGE T-LAP 4X18 ~~LOC~~+RFID (SPONGE) ×3 IMPLANT
SUPPORT HEART JANKE-BARRON (MISCELLANEOUS) ×3 IMPLANT
SUT BONE WAX W31G (SUTURE) ×3 IMPLANT
SUT MNCRL AB 4-0 PS2 18 (SUTURE) IMPLANT
SUT PROLENE 3 0 SH DA (SUTURE) ×3 IMPLANT
SUT PROLENE 4 0 SH DA (SUTURE) IMPLANT
SUT PROLENE 4-0 RB1 .5 CRCL 36 (SUTURE) IMPLANT
SUT PROLENE 5 0 C 1 36 (SUTURE) IMPLANT
SUT PROLENE 6 0 C 1 30 (SUTURE) ×6 IMPLANT
SUT PROLENE 7 0 BV 1 (SUTURE) IMPLANT
SUT PROLENE 7 0 BV1 MDA (SUTURE) ×3 IMPLANT
SUT PROLENE 8 0 BV175 6 (SUTURE) IMPLANT
SUT STEEL 6MS V (SUTURE) ×3 IMPLANT
SUT STEEL STERNAL CCS#1 18IN (SUTURE) IMPLANT
SUT STEEL SZ 6 DBL 3X14 BALL (SUTURE) ×3 IMPLANT
SUT VIC AB 1 CTX36XBRD ANBCTR (SUTURE) ×6 IMPLANT
SUT VIC AB 2-0 CT1 TAPERPNT 27 (SUTURE) IMPLANT
SUT VIC AB 2-0 CTX 27 (SUTURE) IMPLANT
SUT VIC AB 3-0 SH 27X BRD (SUTURE) IMPLANT
SUT VIC AB 3-0 X1 27 (SUTURE) IMPLANT
SUT VICRYL 4-0 PS2 18IN ABS (SUTURE) IMPLANT
SYSTEM SAHARA CHEST DRAIN ATS (WOUND CARE) ×3 IMPLANT
TAPE CLOTH SURG 4X10 WHT LF (GAUZE/BANDAGES/DRESSINGS) IMPLANT
TAPE PAPER 2X10 WHT MICROPORE (GAUZE/BANDAGES/DRESSINGS) IMPLANT
TOWEL GREEN STERILE (TOWEL DISPOSABLE) ×3 IMPLANT
TOWEL GREEN STERILE FF (TOWEL DISPOSABLE) ×3 IMPLANT
TRAY FOLEY SLVR 16FR LF STAT (SET/KITS/TRAYS/PACK) IMPLANT
TRAY FOLEY SLVR 16FR TEMP STAT (SET/KITS/TRAYS/PACK) ×3 IMPLANT
TUBING LAP HI FLOW INSUFFLATIO (TUBING) ×3 IMPLANT
UNDERPAD 30X36 HEAVY ABSORB (UNDERPADS AND DIAPERS) ×3 IMPLANT
WATER STERILE IRR 1000ML POUR (IV SOLUTION) ×6 IMPLANT

## 2023-09-23 NOTE — Progress Notes (Signed)
MD aware of pre extubation ABG.  Orders for 1 amp HCO3 and extubate.    Latest Reference Range & Units 09/23/23 16:23  Sample type  ARTERIAL  pH, Arterial 7.35 - 7.45  7.306 (L)  pCO2 arterial 32 - 48 mmHg 40.8  pO2, Arterial 83 - 108 mmHg 77 (L)  TCO2 22 - 32 mmol/L 22  Acid-base deficit 0.0 - 2.0 mmol/L 6.0 (H)  Bicarbonate 20.0 - 28.0 mmol/L 20.6  O2 Saturation % 95  Patient temperature  36.0 C  (L): Data is abnormally low (H): Data is abnormally high  Also with weaning process patient's blood pressure was extremely elevated. Levophed paused for a time due to MAPs >90 consistently.  As patient would fall back asleep, BP would drop and levophed would be resumed.

## 2023-09-23 NOTE — Progress Notes (Signed)
  Echocardiogram Echocardiogram Transesophageal has been performed.  Delcie Roch 09/23/2023, 4:54 PM

## 2023-09-23 NOTE — Hospital Course (Addendum)
Referring: Jacinto Halim Primary Care: Adrian Prince, MD Primary Cardiologist:Mark Anne Fu, MD    History of Present Illness:       Jesse Coleman is a 67 yo male with history of unprovoked PE, HTN, HLD, Ischemic Cardiomyopathy, and Pituitary Adenoma managed medically.  He was brought to the Emergency Department via EMS with complaints of mid sternal chest pain that developed while blowing leaves.  The pain did not radiate was associated with SOB, but he denid diaphoresis and N/V.  He took 5 SL NTG tablets without relief of symptoms.Workup in the ED showed elevated Troponin level.  EKG showed left bundle branch block which is long standing with subtle changes.  Cardiology consult was obtained who recommended NTG and Heparin drip with transfer to [facility] for further workup and hospitalist admission.  Upon further questioning of the patient he has been experiencing anginal symptoms for months with daily exertional activities.  He was last cathed in 2014 at which time he was noted to have severe 3 vessel CAD with severe stenosis in small caliber vessels.  It was felt medical management at that time.  He has been followed up Cardiology since that time.  He underwent repeat catheterization which showed severe diffuse calcific disease 3v CAD with reduced EF of 25 %.  TCTS consult was requested to consider patient for possible coronary bypass procedure.  The patient is currently having some mild chest pain.  He again states his symptoms have been occurring for a while with minimal exertion.  They have not yet occurred with rest.  He states his father had premature CAD.  Hospital Course: Jesse Coleman was evaluated by Dr. [name] and offered coronary bypass grafting.  The patient decided to proceed.  He remained stable following left heart catheterization.  He was taken to the operative room on 09/23/2023 where CABG x 4 was carried out along with endarterectomy of the LAD with vein patch angioplasty.  Following the  procedure, he separated from cardiopulmonary bypass without difficulty.  He was transferred to the surgical ICU in stable condition. the surgical ICU in stable condition.  Postoperative hospital course:  Patient was extubated using standard post cardiac surgical protocols without difficulty.  He initially did require some inotropic support but this was weaned without difficulty.  All routine lines, monitors and drainage devices have been discontinued in the standard fashion.  With his endarterectomy was felt that he would require Plavix.  He is also to be resumed on his Eliquis.  He is maintaining normal sinus rhythm with rare PVCs.  Incisions are healing well without evidence of infection.  He does have an expected acute blood loss anemia which is being monitored clinically and has been trending lower over time.  He also has a reactive postoperative thrombocytopenia.  Platelet trend has been slowly improving.  He is tolerating gradually increasing activities using standard postoperative cardiac surgical protocols.  Oxygen has been weaned he maintains good saturations on room air.

## 2023-09-23 NOTE — Procedures (Signed)
Extubation Procedure Note  Patient Details:   Name: Prynce Barrozo DOB: 1956/03/13 MRN: [id number]   Airway Documentation:    Vent end date: 09/23/23 Vent end time: 1632   Evaluation  O2 sats: stable throughout Complications: No apparent complications Patient did tolerate procedure well. Bilateral Breath Sounds: Clear   Yes  Pt achieved a NIF of -35 and VC of 1.08 with great pt effort on all attempts.Pt extubated to 2L [place] per protocol, Pt tolerated well. Cuff leak present, no stridor noted, RN at bedside,MD aware, RT will monitor as needed.   Thornell Mule 09/23/2023, 5:03 PM

## 2023-09-23 NOTE — Interval H&P Note (Signed)
History and Physical Interval Note:  09/23/2023 7:13 AM  Jesse Coleman  has presented today for surgery, with the diagnosis of CAD.  The various methods of treatment have been discussed with the patient and family. After consideration of risks, benefits and other options for treatment, the patient has consented to  Procedure(s): CORONARY ARTERY BYPASS GRAFTING (CABG) (N/A) TRANSESOPHAGEAL ECHOCARDIOGRAM (TEE) (N/A) as a surgical intervention.  The patient's history has been reviewed, patient examined, no change in status, stable for surgery.  I have reviewed the patient's chart and labs.  Questions were answered to the patient's satisfaction.     Loreli Slot

## 2023-09-23 NOTE — Op Note (Signed)
Jesse Coleman, Jesse Coleman MEDICAL RECORD NO: [id number] ACCOUNT NO: [id number] DATE OF BIRTH: [birth year] FACILITY: [facility] LOCATION: [facility] PHYSICIAN: Salvatore Decent. [name], MD  Operative Report   DATE OF PROCEDURE: 09/23/2023  PREOPERATIVE DIAGNOSIS:  Severe three-vessel coronary artery disease.  POSTOPERATIVE DIAGNOSIS:  Severe three-vessel coronary artery disease.  PROCEDURE:   Median sternotomy, extracorporeal circulation,  Coronary artery bypass grafting x4,  LIMA to LAD with endarterectomy and vein patch angioplasty,  Saphenous vein graft to first diagonal,  Saphenous vein graft to obtuse marginal 1,  Saphenous vein graft to left posterior descending. Endoscopic vein harvest right leg  SURGEON:  Viviann Spare C. [name], MD  ASSISTANT:  Jillyn Hidden, PA  ANESTHESIA:  General.  FINDINGS:  Transesophageal echocardiography showed ejection fraction of approximately 45%.  No significant valvular pathology.  Tiny PFO.  No change post bypass.  Good quality conduits.  Diffusely diseased, poor quality targets. LAD requiring endarterectomy and vein patch angioplasty.   Not a candidate for redo bypass grafting.  CLINICAL NOTE:  [name] is a 67 year old gentleman with a complex medical history who presented with exertional chest pain.  He ruled in for a non-ST elevation MI.  Catheterization revealed severe three-vessel coronary artery disease.  Ejection fraction was estimated at 25%, although later was shown to be approximately 50% by echocardiogram.  He was offered coronary artery bypass grafting.  The indications, risks, benefits, and alternatives were discussed in detail with the patient.  He understood the limitations due to the severe diffusely diseased vessels.  He accepted the risks and agreed to proceed.  OPERATIVE NOTE:  [name] was brought to the operating room on 09/23/2023.  He had induction of general anesthesia and was intubated.  Dr. Val Eagle of Anesthesiology  performed transesophageal echocardiography.  Please see his separately dictated note for full details of the procedure.  A Foley catheter was placed.  Intravenous antibiotics were administered.  The chest, abdomen, and legs were prepped and draped in the usual sterile fashion.  A timeout was performed.  A median sternotomy was performed and the left internal mammary artery was harvested using standard technique under direct vision.  Simultaneously, an incision was made in the medial aspect of the right leg below the level of the knee.  The greater saphenous vein was harvested from the ankle to the groin endoscopically.  That was performed by Jillyn Hidden independently while I harvested the mammary artery.  He then closed the leg incisions.  He further assisted with exposure, retraction of delicate tissues, suture management, and suctioning during the anastomosis.  2000 units of heparin was administered during the vessel harvest.  The remainder of the full heparin dose was given prior to opening the pericardium.  After harvesting the conduits, the sternal retractor was placed and was gradually opened over time.  The pericardium was opened.  Adequate anticoagulation was confirmed with ACT measurement.  The aorta then was cannulated via concentric 2-0 Ethibond pledgeted pursestring sutures.  A dual-stage venous cannula was placed via a pursestring suture in the right atrial appendage.  Cardiopulmonary bypass was initiated.  Flows were maintained per protocol and the patient was cooled to 32 degrees Celsius.  The coronary arteries were inspected and anastomotic sites were chosen.  The coronaries were all very diffusely diseased, poor quality target vessels.  The conduits were inspected and prepared.  A foam pad was placed in the pericardium to insulate the heart.  Temperature probe was placed in the myocardial septum and a cardioplegia cannula was placed in  the ascending aorta.  The aorta was cross-clamped.   The left ventricle was emptied via the aortic root vein.  Cardiac arrest was achieved with a combination of cold antegrade blood cardioplegia and topical iced saline.  There was a diastolic arrest with approximately 500 mL of cardioplegia and septal cooling was ultimately achieved to 13 degrees Celsius.  A reversed saphenous vein graft was placed end to side to the first diagonal branch of the LAD.  This was a high anterolateral branch.  It was a 1.5 mm poor quality target vessel.  A probe did pass distally.  The vessel had plaque throughout its course.  The vein was anastomosed end to side with a running 7-0 Prolene suture.  A probe did pass proximally and distally after completing the anastomosis and cardioplegia was administered down the graft and there was good flow and good hemostasis.    Next, a reversed saphenous vein graft was placed end-to-side to the first obtuse marginal.  This was a large lateral branch.  It was intramyocardial and diffusely diseased.  Again, there was no clear area past the disease to bypass.  It was bypassed in what was felt to be the best spot, but it was still poor quality and diffusely diseased with significant plaque in that area.  The vein was of good quality.  It was anastomosed end to side with a running 7-0 Prolene suture were passed proximally and distally.  Cardioplegia was  administered down the graft and there was good flow and good hemostasis.  Additional cardioplegia was administered down the aortic root.  Next, the inferior wall was exposed and the posterior descending branch of the left circumflex was dissected out.  It was likewise diffusely diseased.  Arteriotomy was made.  There was significant plaque.  Only a 1 mm probe would pass proximally and distally.  The vein was anastomosed end to side with a running 7-0 Prolene suture.  The probe did pass at the completion of the anastomosis and there was good flow and good hemostasis with cardioplegia  administration.  Left internal mammary artery was brought through a window in the pericardium.  The distal end was beveled.  The mammary was a 2 mm good quality conduit.  An arteriotomy was made in the only portion of the LAD where it appeared to be possible, but a probe would not pass proximally or distally.  The plane was developed between the adventitia and the calcified plaque and an endarterectomy was performed.  There was an excellent feathering plaque distally.  More proximally, the plaque would not come out completely.  Given the length of the arteriotomy in the LAD, the decision was made to place a vein patch and then anastomose the LIMA to that.  The vein patch was sewn into place with a running 7-0 Prolene suture.  An arteriotomy was then made in the vein patch and the mammary anastomosis was performed with a running 8-0 Prolene suture.  At the completion of the anastomosis, the Bulldog clamp was briefly removed to inspect for hemostasis.  There was some bleeding from around the vein patch, which was repaired with 7-0 Prolene sutures.  Rapid septal rewarming was noted.  The bulldog clamps was replaced.  The mammary pedicle was tacked to the epicardial surface of the heart with 6-0 Prolene sutures.  Additional cardioplegia was administered.  Vein grafts were cut to length.  The cardioplegia cannula was removed from the ascending aorta.  The proximal vein graft anastomosis were performed to  4.5 mm punch aortotomies with running 6-0 Prolene sutures.  At the completion of the final proximal anastomosis, the patient was placed in Trendelenburg position.  Lidocaine was administered.  The aortic root was deaired and the aortic cross clamp was removed.  The total cross clamp time was 115 minutes.  While rewarming was completed, all proximal and distal anastomosis were inspected for hemostasis.  Epicardial pacing wires were placed on the right ventricle and right atrium.  When the patient had rewarmed to a  core temperature of 37 degrees Celsius, he was weaned from cardiopulmonary bypass on the first attempt.  He was on milrinone at 0.25 mcg per kilogram per minute and low-dose norepinephrine infusion at the time of separation from bypass.  Total bypass time was 154 minutes.  The patient did not have a Swan-Ganz catheter due to latex allergy.  Cardiac outputs were monitored by Flow track, which correlated with the measurements calculated by echocardiography.  The patient remained hemodynamically stable throughout the post-bypass period.  Test dose of protamine was administered and was well tolerated.  The atrial and aortic cannulae were removed.  The remainder of the protamine was administered without incident.  The chest was copiously irrigated with saline.  Hemostasis was achieved.  Left pleural and mediastinal chest tubes were placed through separate subcostal incisions and secured with #1 silk sutures.  The sternum was closed with a combination of single double-heavy gauge stainless steel wires.  The pectoralis fascia and subcutaneous tissue and skin were closed in a standard fashion.  All sponge, needle, and instrument counts were correct at the end of the procedure.  The patient was taken from the operating room to the surgical intensive care unit, intubated and in fair condition.   PUS D: 09/23/2023 1:57:38 pm T: 09/23/2023 3:20:00 pm  JOB: [id number]/ [id number]

## 2023-09-23 NOTE — Discharge Instructions (Addendum)

## 2023-09-23 NOTE — Anesthesia Procedure Notes (Signed)
Arterial Line Insertion Start/End12/24/2024 7:00 AM, 09/23/2023 7:10 AM Performed by: Darryl Nestle, CRNA, CRNA  Patient location: Pre-op. Preanesthetic checklist: patient identified, IV checked, site marked, risks and benefits discussed, surgical consent, monitors and equipment checked, pre-op evaluation, timeout performed and anesthesia consent Lidocaine 1% used for infiltration Left, radial was placed Catheter size: 20 G Hand hygiene performed  and maximum sterile barriers used   Attempts: 1 Procedure performed without using ultrasound guided technique. Following insertion, dressing applied and Biopatch. Post procedure assessment: normal and unchanged  Patient tolerated the procedure well with no immediate complications.

## 2023-09-23 NOTE — Progress Notes (Signed)
Patient went to surgery early morning hours.  Unable to examine patient.  He will be transferred to ICU service under surgical services.  TRH will sign off.

## 2023-09-23 NOTE — Brief Op Note (Signed)
09/18/2023 - 09/23/2023  12:20 PM  PATIENT:  Jesse Coleman  67 y.o. male  PRE-OPERATIVE DIAGNOSIS:  Coronary Artery Disease  POST-OPERATIVE DIAGNOSIS:  Coronary Artery Disease  PROCEDURE:   CORONARY ARTERY BYPASS GRAFTING (CABG) X 4, USING LEFT INTERNAL MAMMARY ARTERY AND ENDOSCOPICALLY HARVESTED RIGHT GREATER SAPHENOUS VEIN   LIMA-LAD SVG-D1 SVG-OM SVG-PDA  LEFT ANTERIOR DESCENDING CORONARY ARTERY ENDARTERECTOMY WITH VEIN PATCH ANGIOPLASTY  TRANSESOPHAGEAL ECHOCARDIOGRAM   Vein harvest time: Vein prep time:  SURGEON:  Loreli Slot, MD - Primary  PHYSICIAN ASSISTANT: Melvine Julin  ASSISTANTS: Bruins, Brooklyn L, Scrub Person        Acuna, Benay Pillow, RN, RN First Assistant   ANESTHESIA:   general  EBL:  BLOOD ADMINISTERED:Platelets x 1 pheresis  DRAINS:  Bilateral pleural and mediastinal drains    LOCAL MEDICATIONS USED:  NONE  SPECIMEN:  LAD endarterectomy plaque.  DISPOSITION OF SPECIMEN:  PATHOLOGY  COUNTS:  YES  DICTATION: .Dragon Dictation  PLAN OF CARE: Admit to inpatient   PATIENT DISPOSITION:  ICU - intubated and hemodynamically stable.   Delay start of Pharmacological VTE agent (>24hrs) due to surgical blood loss or risk of bleeding: yes

## 2023-09-23 NOTE — Discharge Summary (Incomplete)
Physician Discharge Summary  Patient ID: Jesse Coleman MRN: [id number] DOB/AGE: 69-04-1956 69 y.o.  Admit date: 09/18/2023 Discharge date: 09/26/2023  Admission Diagnoses:  Multivessel coronary artery disease Acute non-ST elevation myocardial infarction Ischemic cardiomyopathy Hypertension Dyslipidemia Anxiety Benign prostatic hyperplasia History of pulmonary embolism  Discharge Diagnoses:   Multivessel coronary artery disease Acute non-ST elevation myocardial infarction Ischemic cardiomyopathy Status post CABG x 4 Hypertension Dyslipidemia Anxiety Benign prostatic hyperplasia History of pulmonary embolism   Discharged Condition: {condition:18240}  Referring: Ganji Primary Care: Adrian Prince, MD Primary Cardiologist:Mark Anne Fu, MD    History of Present Illness:       Jesse Coleman is a 67 yo male with history of unprovoked PE, HTN, HLD, Ischemic Cardiomyopathy, and Pituitary Adenoma managed medically.  He was brought to the Emergency Department via EMS with complaints of mid sternal chest pain that developed while blowing leaves.  The pain did not radiate was associated with SOB, but he denid diaphoresis and N/V.  He took 5 SL NTG tablets without relief of symptoms.Workup in the ED showed elevated Troponin level.  EKG showed left bundle branch block which is long standing with subtle changes.  Cardiology consult was obtained who recommended NTG and Heparin drip with transfer to [facility] for further workup and hospitalist admission.  Upon further questioning of the patient he has been experiencing anginal symptoms for months with daily exertional activities.  He was last cathed in 2014 at which time he was noted to have severe 3 vessel CAD with severe stenosis in small caliber vessels.  It was felt medical management at that time.  He has been followed up Cardiology since that time.  He underwent repeat catheterization which showed severe diffuse calcific disease 3v CAD with  reduced EF of 25 %.  TCTS consult was requested to consider patient for possible coronary bypass procedure.  The patient is currently having some mild chest pain.  He again states his symptoms have been occurring for a while with minimal exertion.  They have not yet occurred with rest.  He states his father had premature CAD.  Hospital Course:  Jesse Coleman was evaluated by Dr. [name] and offered coronary bypass grafting.  The patient decided to proceed.  He remained stable following left heart catheterization.  He was taken to the operative room on 09/23/2023 where CABG x 4 was carried out along with endarterectomy of the LAD with vein patch angioplasty.  Following the procedure, he separated from cardiopulmonary bypass without difficulty.  He was transferred to the surgical ICU in stable condition. the surgical ICU in stable condition. ostoperative hospital course:  Patient was extubated using standard post cardiac surgical protocols without difficulty.  He initially did require some inotropic support but this was weaned without difficulty.  All routine lines, monitors and drainage devices have been discontinued in the standard fashion.  With his endarterectomy was felt that he would require Plavix.  He is also to be resumed on his Eliquis.  He is maintaining normal sinus rhythm with rare PVCs.  Incisions are healing well without evidence of infection.  He does have an expected acute blood loss anemia which is being monitored clinically and has been trending lower over time.  He also has a reactive postoperative thrombocytopenia.  Platelet trend has been slowly improving.  He is tolerating gradually increasing activities using standard postoperative cardiac surgical protocols.  Oxygen has been weaned he maintains good saturations on room air. Consults: None  Significant Diagnostic Studies: {diagnostics:18242} DG Chest [facility] 1 [address]  Result Date: 09/25/2023 CLINICAL DATA:  Atelectasis. EXAM: PORTABLE  CHEST 1 VIEW COMPARISON:  September 24, 2023. FINDINGS: Stable cardiomediastinal silhouette. Status post coronary artery bypass graft. Bilateral chest tubes are noted without definite pneumothorax. Right internal jugular catheter is unchanged. Left lung is clear. Minimal right basilar subsegmental atelectasis is noted. Bony thorax is unremarkable. IMPRESSION: Bilateral chest tubes are noted without definite pneumothorax. Minimal right basilar subsegmental atelectasis. Electronically Signed   By: Lupita Raider M.D.   On: 09/25/2023 08:57   DG Chest Port 1 View Result Date: 09/24/2023 CLINICAL DATA:  67 year old male status post CABG. EXAM: PORTABLE CHEST 1 VIEW COMPARISON:  Chest x-ray 09/23/2023. FINDINGS: Patient has been extubated. There is a right-sided internal jugular central venous catheter with tip terminating in the superior cavoatrial junction. Bilateral chest tubes are stable in position. Lung volumes are very low. Bibasilar opacities have increased, likely reflecting increasing postoperative atelectasis. Small bilateral pleural effusions. No appreciable pneumothorax. No evidence of pulmonary edema. Heart size appears upper limits of normal. Upper mediastinal contours are within normal limits. Status post median sternotomy for CABG. IMPRESSION: 1. Support apparatus and postoperative changes, as above. 2. Persistent low lung volumes with increasing bibasilar postoperative atelectasis and small bilateral pleural effusions. Electronically Signed   By: Trudie Reed M.D.   On: 09/24/2023 08:42   DG Chest Port 1 View Result Date: 09/23/2023 CLINICAL DATA:  Status post CABG EXAM: PORTABLE CHEST 1 VIEW COMPARISON:  09/18/2023 FINDINGS: Status post interval median sternotomy and CABG. Cardiomegaly. Support apparatus includes endotracheal tube and esophagogastric tube, appropriately positioned, as well as right neck vascular catheter and mediastinal and chest tubes. Layering right-greater-than-left  pleural effusions. No acute airspace opacity or pneumothorax. No acute osseous findings. IMPRESSION: 1. Status post interval median sternotomy and CABG. Support apparatus includes endotracheal tube and esophagogastric tube, appropriately positioned, as well as right neck vascular catheter and mediastinal and chest tubes. 2. Layering right-greater-than-left pleural effusions. No acute airspace opacity or pneumothorax. Electronically Signed   By: Jearld Lesch M.D.   On: 09/23/2023 14:17   VAS US DOPPLER PRE CABG Result Date: 09/21/2023 PREOPERATIVE VASCULAR EVALUATION Patient Name:  Jesse Coleman  Date of Exam:   09/20/2023 Medical Rec #: [id number]       Accession #:    [id number] Date of Birth: 1955-10-12       Patient Gender: M Patient Age:   22 years Exam Location:  [facility] Procedure:      VAS US DOPPLER PRE CABG Referring Phys: Viviann Spare HENDRICKSON --------------------------------------------------------------------------------  Indications:  Pre-CABG. Risk Factors: Hypertension, hyperlipidemia, coronary artery disease. Performing Technologist: Shona Simpson  Examination Guidelines: A complete evaluation includes B-mode imaging, spectral Doppler, color Doppler, and power Doppler as needed of all accessible portions of each vessel. Bilateral testing is considered an integral part of a complete examination. Limited examinations for reoccurring indications may be performed as noted.  Right Carotid Findings: +----------+--------+--------+--------+--------+------------------+           PSV cm/sEDV cm/sStenosisDescribeComments           +----------+--------+--------+--------+--------+------------------+ CCA Prox  93      18                                         +----------+--------+--------+--------+--------+------------------+ CCA Distal95      21  intimal thickening +----------+--------+--------+--------+--------+------------------+ ICA Prox  95      27       1-39%   calcific                   +----------+--------+--------+--------+--------+------------------+ ICA Mid   86      25                                         +----------+--------+--------+--------+--------+------------------+ ICA Distal95      25                                         +----------+--------+--------+--------+--------+------------------+ ECA       222     17      >50%    calcific                   +----------+--------+--------+--------+--------+------------------+ +----------+--------+-------+--------+------------+           PSV cm/sEDV cmsDescribeArm Pressure +----------+--------+-------+--------+------------+ Subclavian112     0                           +----------+--------+-------+--------+------------+ +---------+--------+--+--------+--+ VertebralPSV cm/s55EDV cm/s14 +---------+--------+--+--------+--+ Left Carotid Findings: +----------+--------+--------+--------+--------+------------------+           PSV cm/sEDV cm/sStenosisDescribeComments           +----------+--------+--------+--------+--------+------------------+ CCA Prox  130     27                                         +----------+--------+--------+--------+--------+------------------+ CCA Distal104     20                      intimal thickening +----------+--------+--------+--------+--------+------------------+ ICA Prox  83      19      1-39%   calcific                   +----------+--------+--------+--------+--------+------------------+ ICA Distal85      23                                         +----------+--------+--------+--------+--------+------------------+ ECA       138     18                                         +----------+--------+--------+--------+--------+------------------+ +----------+--------+--------+--------+------------+ SubclavianPSV cm/sEDV cm/sDescribeArm Pressure +----------+--------+--------+--------+------------+            131     0                            +----------+--------+--------+--------+------------+ +---------+--------+--+--------+--+ VertebralPSV cm/s72EDV cm/s21 +---------+--------+--+--------+--+  ABI Findings: +------------------+-----+---------+ Rt Pressure (mmHg)IndexWaveform  +------------------+-----+---------+ 170                    triphasic +------------------+-----+---------+ 171               1.01 biphasic  +------------------+-----+---------+ 169  0.99 biphasic  +------------------+-----+---------+ +------------------+-----+---------+ Lt Pressure (mmHg)IndexWaveform  +------------------+-----+---------+ 161                              +------------------+-----+---------+ 178               1.05 triphasic +------------------+-----+---------+ 174               1.02 biphasic  +------------------+-----+---------+ +-------+---------------+ ABI/TBIToday's ABI/TBI +-------+---------------+ Right  1.01            +-------+---------------+ Left   1.05            +-------+---------------+  Right Doppler Findings: +--------+--------+---------+ Site    PressureDoppler   +--------+--------+---------+ Brachial170     triphasic +--------+--------+---------+ Radial          triphasic +--------+--------+---------+ Ulnar           triphasic +--------+--------+---------+  Left Doppler Findings: +----------+--------+---------+ Site      PressureDoppler   +----------+--------+---------+ Subclavian        triphasic +----------+--------+---------+ Brachial  161               +----------+--------+---------+ Radial            triphasic +----------+--------+---------+ Ulnar             triphasic +----------+--------+---------+   Summary: Right Carotid: Velocities in the right ICA are consistent with a 1-39% stenosis.                The ECA appears >50% stenosed. Left Carotid: Velocities in the left ICA are consistent with a  1-39% stenosis. Vertebrals:  Bilateral vertebral arteries demonstrate antegrade flow. Subclavians: Normal flow hemodynamics were seen in bilateral subclavian              arteries. Right ABI: Resting right ankle-brachial index is within normal range. Left ABI: Resting left ankle-brachial index is within normal range. Right Upper Extremity: Doppler waveforms decrease 50% with right radial compression. Doppler waveforms decrease >50% with right ulnar compression. Left Upper Extremity: Doppler waveforms decrease >50% with left radial compression. Doppler waveforms remain within normal limits with left ulnar compression.  Electronically signed by Coral Else MD on 09/21/2023 at 12:13:00 AM.    Final    ECHOCARDIOGRAM COMPLETE Result Date: 09/20/2023    ECHOCARDIOGRAM REPORT   Patient Name:   Jesse Coleman Date of Exam: 09/20/2023 Medical Rec #:  [id number]      Height:       70.0 in Accession #:    [id number]     Weight:       197.0 lb Date of Birth:  [birth year]      BSA:          2.074 m Patient Age:    67 years       BP:           141/72 mmHg Patient Gender: M              HR:           80 bpm. Exam Location:  Inpatient Procedure: 2D Echo, 3D Echo, Cardiac Doppler and Color Doppler Indications:    Dyspnea R06.00  History:        Patient has prior history of Echocardiogram examinations, most                 recent 06/10/2019. Cardiomyopathy, Previous Myocardial Infarction  and CAD, Signs/Symptoms:Chest Pain; Risk Factors:Dyslipidemia.  Sonographer:    Lucendia Herrlich RCS Referring Phys: [id number] IMPRESSIONS  1. Left ventricular ejection fraction, by estimation, is 50 to 55%. Left ventricular ejection fraction by 3D volume is 52 %. The left ventricle has low normal function. The left ventricle demonstrates regional wall motion abnormalities (see scoring diagram/findings for description). Left ventricular diastolic parameters were normal. apical hypokinesis without thrombus.  2. Right  ventricular systolic function is normal. The right ventricular size is normal.  3. The mitral valve is grossly normal. Mild mitral valve regurgitation. No evidence of mitral stenosis.  4. The aortic valve is tricuspid. Aortic valve regurgitation is trivial. No aortic stenosis is present. Comparison(s): Prior images reviewed side by side. New apical wall motion abnormalities possible, apex not as well evaluated in 2020 study. FINDINGS  Left Ventricle: Left ventricular ejection fraction, by estimation, is 50 to 55%. Left ventricular ejection fraction by 3D volume is 52 %. The left ventricle has low normal function. The left ventricle demonstrates regional wall motion abnormalities. The  left ventricular internal cavity size was normal in size. Suboptimal image quality limits for assessment of left ventricular hypertrophy. Left ventricular diastolic parameters were normal.  LV Wall Scoring: The apical lateral segment, apical septal segment, apical anterior segment, and apical inferior segment are hypokinetic. The anterior wall, antero-lateral wall, anterior septum, inferior wall, posterior wall, mid inferoseptal segment, and basal inferoseptal segment are normal. Apical hypokinesis without thrombus. Right Ventricle: The right ventricular size is normal. No increase in right ventricular wall thickness. Right ventricular systolic function is normal. Left Atrium: Left atrial size was normal in size. Right Atrium: Right atrial size was normal in size. Pericardium: There is no evidence of pericardial effusion. Mitral Valve: The mitral valve is grossly normal. Mild mitral valve regurgitation. No evidence of mitral valve stenosis. Tricuspid Valve: The tricuspid valve is normal in structure. Tricuspid valve regurgitation is trivial. No evidence of tricuspid stenosis. Aortic Valve: The aortic valve is tricuspid. Aortic valve regurgitation is trivial. No aortic stenosis is present. Aortic valve peak gradient measures 6.0 mmHg.  Pulmonic Valve: The pulmonic valve was not well visualized. Pulmonic valve regurgitation is not visualized. No evidence of pulmonic stenosis. Aorta: The aortic root is normal in size and structure. IAS/Shunts: The atrial septum is grossly normal.  LEFT VENTRICLE PLAX 2D LVIDd:         4.80 cm         Diastology LVIDs:         3.50 cm         LV e' medial:    7.51 cm/s LV PW:         1.10 cm         LV E/e' medial:  8.7 LV IVS:        1.40 cm         LV e' lateral:   9.03 cm/s LVOT diam:     2.00 cm         LV E/e' lateral: 7.3 LV SV:         52 LV SV Index:   25 LVOT Area:     3.14 cm        3D Volume EF                                LV 3D EF:    Left  ventricul                                             ar                                             ejection                                             fraction                                             by 3D                                             volume is                                             52 %.                                 3D Volume EF:                                3D EF:        52 %                                LV EDV:       184 ml                                LV ESV:       88 ml                                LV SV:        96 ml RIGHT VENTRICLE RV S prime:     10.80 cm/s TAPSE (M-mode): 1.5 cm LEFT ATRIUM             Index        RIGHT ATRIUM           Index LA diam:        4.80 cm 2.31 cm/m   RA Area:     11.40 cm LA Vol (A2C):   51.6 ml 24.88 ml/m  RA Volume:   18.20 ml  8.78 ml/m LA Vol (A4C):   49.7 ml 23.96 ml/m LA Biplane Vol: 52.9 ml 25.51 ml/m  AORTIC VALVE AV Area (Vmax): 2.58 cm AV Vmax:  122.00 cm/s AV Peak Grad:   6.0 mmHg LVOT Vmax:      100.23 cm/s LVOT Vmean:     68.733 cm/s LVOT VTI:       0.165 m  AORTA Ao Root diam: 3.40 cm MITRAL VALVE MV Area (PHT): 6.07 cm    SHUNTS MV Decel Time: 125 msec    Systemic VTI:  0.17 m MR Peak grad: 67.9 mmHg    Systemic Diam:  2.00 cm MR Vmax:      412.00 cm/s MV E velocity: 65.50 cm/s Riley Lam MD Electronically signed by Riley Lam MD Signature Date/Time: 09/20/2023/5:17:41 PM    Final    CARDIAC CATHETERIZATION Addendum Date: 09/19/2023 Left Heart Catheterization 09/19/23: Hemodynamic data: LV 122/0, EDP 10 mmHg.  Ao 108/61, mean 79 mmHg.  No pressure gradient across the aortic valve. Angiographic data: Severe coronary calcification of all the coronary arteries. LV: Marked global hypokinesis.  LVEF 25 to at most 30%. LM: Calcified but large-caliber vessel.  No luminal obstruction. LAD: Severely calcified throughout.  Proximal LAD has eccentric 70 to 80% stenosis. Small D1 and D2 with high-grade ostial stenosis of 90% and diffuse disease.  Moderate size D3 with ostial 99% stenosis.  LAD CTO after D3 origin.  Distal LAD collateralized by ipsilateral and contralateral collaterals.  Distal LAD appears bypassable. LCx: Dominant.  Moderate disease in the ostium and proximal segment of 40%.  Very tiny OM1 and 2 with severe disease, large OM 3 with ostial 90% stenosis.  Distal CX has mild disease. RI: CTO in the ostium.  Ipsilateral collaterals.  The vessel faintly and appears to be small to moderate caliber vessel however large distribution. RCA: Nondominant with diffuse disease with a proximal 90% focal stenosis.  Gives collaterals to distal LAD. Impression and recommendations: Severe diffuse calcific three-vessel coronary artery disease.  Severely reduced LVEF.  Will refer for CT surgery for consideration for CABG.  Result Date: 09/19/2023 Images from the original result were not included. Left Heart Catheterization 09/19/23: Hemodynamic data: LV 122/0, EDP 10 mmHg.  Ao 108/61, mean 79 mmHg.  No pressure gradient across the aortic valve. Angiographic data: Severe coronary calcification of all the coronary arteries. LV: Marked global hypokinesis.  LVEF 25 to at most 30%. LM: Calcified but large-caliber vessel.  No  luminal obstruction. LAD: Severely calcified throughout.  Proximal LAD has eccentric 70 to 80% stenosis. Small D1 and D2 with high-grade ostial stenosis of 90% and diffuse disease.  Moderate size D3, free of disease.  LAD CTO after D3 origin.  Distal LAD collateralized by ipsilateral and contralateral collaterals.  Distal LAD appears bypassable. LCx: Dominant.  Moderate disease in the ostium and proximal segment of 40%.  Very tiny OM1 and 2 with severe disease, large OM 3 with ostial 90% stenosis.  Distal CX has mild disease. RI: CTO in the ostium.  Ipsilateral collaterals.  The vessel faintly and appears to be small to moderate caliber vessel however large distribution. RCA: Nondominant with diffuse disease with a proximal 90% focal stenosis.  Gives collaterals to distal LAD. Impression and recommendations: Severe diffuse calcific three-vessel coronary artery disease.  Severely reduced LVEF.  Will refer for CT surgery for consideration for CABG.   DG Chest Port 1 View Result Date: 09/18/2023 CLINICAL DATA:  Hypoxia EXAM: PORTABLE CHEST 1 VIEW COMPARISON:  Film from earlier in the same day. FINDINGS: Cardiac shadow is stable. Mild aortic calcifications are seen. Lungs are well aerated bilaterally. Increased interstitial changes are noted consistent with mild  edema. No bony abnormality is seen. IMPRESSION: Mild interstitial edema.  No acute infiltrate is seen. Electronically Signed   By: Alcide Clever M.D.   On: 09/18/2023 22:28   DG Chest 2 View Result Date: 09/18/2023 CLINICAL DATA:  cp EXAM: CHEST - 2 VIEW COMPARISON:  10/09/2020. FINDINGS: The heart size and mediastinal contours are within normal limits. Both lungs are clear. No pneumothorax or pleural effusion. Aorta is calcified. There are thoracic degenerative changes. IMPRESSION: No acute cardiopulmonary disease. Electronically Signed   By: Layla Maw M.D.   On: 09/18/2023 19:03    Treatments: {Tx:18249}   Operative Report    DATE OF  PROCEDURE: 09/23/2023   PREOPERATIVE DIAGNOSIS:  Severe three-vessel coronary artery disease.   POSTOPERATIVE DIAGNOSIS:  Severe three-vessel coronary artery disease.   PROCEDURE:  Median sternotomy, extracorporeal circulation, coronary artery bypass grafting x4, LIMA to LAD with endarterectomy and vein patch angioplasty, saphenous vein graft to first diagonal, saphenous vein graft to obtuse marginal 1, saphenous vein  graft to left posterior descending.   SURGEON:  Salvatore Decent. [name], MD   ASSISTANT:  Jillyn Hidden, PA   ANESTHESIA:  General. Discharge Exam: Blood pressure 110/63, pulse 75, temperature 98.4 F (36.9 C), temperature source Oral, resp. rate 18, height 5\' 10"  (1.778 m), weight 90.9 kg, SpO2 96%. {physical WUJW:1191478}  Disposition:    Allergies as of 09/26/2023       Reactions   Amoxicillin-pot Clavulanate Nausea And Vomiting   Erythromycin Nausea And Vomiting   Latex Swelling, Other (See Comments)   Lips swell, but no breathing issues     Med Rec must be completed prior to using this [facility]***       Follow-up Information     Alver Sorrow, NP Follow up.   Specialty: Cardiology Why: Hospital follow-up with Cardiology scheduled for 10/14/2023 at 10:05am in our [place] office. Please arrive 15 minutes early for check-in. If this date/ time does not work for you, pleaes call our office to reschedule. Contact information: [address] [place] [address] [phone]                 The patient has been discharged on:   1.Beta Blocker:  Yes [   ]                              No   [   ]                              If No, reason:  2.Ace Inhibitor/ARB: Yes [   ]                                     No  [    ]                                     If No, reason:  3.Statin:   Yes [   ]                  No  [   ]                  If No, reason:  4.Ecasa:  Yes  [   ]  No   [   ]                  If  No, reason:  5. ACS on Admission?  P2Y12 Inhibitor:  Yes  [   ]                                No  [  ]    Signed: Glenice Laine Salvator Seppala 09/26/2023, 10:04 AM

## 2023-09-23 NOTE — Anesthesia Preprocedure Evaluation (Signed)
Anesthesia Evaluation  Patient identified by MRN, date of birth, ID band Patient awake    Reviewed: Allergy & Precautions, NPO status , Patient's Chart, lab work & pertinent test results  History of Anesthesia Complications Negative for: history of anesthetic complications  Airway Mallampati: I  TM Distance: >3 FB Neck ROM: Full    Dental  (+) Teeth Intact, Dental Advisory Given   Pulmonary neg shortness of breath, neg sleep apnea, neg COPD, neg recent URI   breath sounds clear to auscultation       Cardiovascular hypertension, + angina  + CAD and + Past MI   Rhythm:Regular   1. Left ventricular ejection fraction, by estimation, is 50 to 55%. Left  ventricular ejection fraction by 3D volume is 52 %. The left ventricle has  low normal function. The left ventricle demonstrates regional wall motion  abnormalities (see scoring  diagram/findings for description). Left ventricular diastolic parameters  were normal. apical hypokinesis without thrombus.   2. Right ventricular systolic function is normal. The right ventricular  size is normal.   3. The mitral valve is grossly normal. Mild mitral valve regurgitation.  No evidence of mitral stenosis.   4. The aortic valve is tricuspid. Aortic valve regurgitation is trivial.  No aortic stenosis is present.     Neuro/Psych  PSYCHIATRIC DISORDERS Anxiety     negative neurological ROS     GI/Hepatic negative GI ROS, Neg liver ROS,,,  Endo/Other  negative endocrine ROS    Renal/GU Renal diseaseLab Results      Component                Value               Date                      NA                       136                 09/22/2023                K                        3.8                 09/22/2023                CO2                      22                  09/22/2023                GLUCOSE                  92                  09/22/2023                BUN                      17                   09/22/2023                CREATININE  0.93                09/22/2023                CALCIUM                  9.5                 09/22/2023                GFRNONAA                 >60                 09/22/2023                Musculoskeletal   Abdominal   Peds  Hematology Lab Results      Component                Value               Date                      WBC                      4.9                 09/23/2023                HGB                      14.7                09/23/2023                HCT                      43.8                09/23/2023                MCV                      91.3                09/23/2023                PLT                      124 (L)             09/23/2023              Anesthesia Other Findings   Reproductive/Obstetrics                              Anesthesia Physical Anesthesia Plan  ASA: 4  Anesthesia Plan: General   Post-op Pain Management:    Induction: Intravenous  PONV Risk Score and Plan: 3 and Ondansetron  Airway Management Planned: Oral ETT  Additional Equipment: Arterial line, TEE, CVP and Ultrasound Guidance Line Placement  Intra-op Plan:   Post-operative Plan: Post-operative intubation/ventilation  Informed Consent: I have reviewed the patients History and Physical, chart, labs and discussed the procedure including the risks, benefits and alternatives for the proposed anesthesia with the patient or authorized representative who has indicated his/her understanding and  acceptance.     Dental advisory given  Plan Discussed with: CRNA  Anesthesia Plan Comments:          Anesthesia Quick Evaluation

## 2023-09-23 NOTE — Transfer of Care (Signed)
Immediate Anesthesia Transfer of Care Note  Patient: Jesse Coleman  Procedure(s) Performed: CORONARY ARTERY BYPASS GRAFTING (CABG) X 4, USING LEFT INTERNAL MAMMARY ARTERY AND ENDOSCOPICALLY HARVESTED RIGHT GREATER SAPHENOUS VEIN (Chest) TRANSESOPHAGEAL ECHOCARDIOGRAM (TEE) LEFT ANTERIOR DESCENDING ARTERY ENDARTERECTOMY, VEIN PATCH ANGIOPLASTY  Patient Location: SICU  Anesthesia Type:General  Level of Consciousness: Patient remains intubated per anesthesia plan  Airway & Oxygen Therapy: Patient remains intubated per anesthesia plan  Post-op Assessment: Report given to RN and Post -op Vital signs reviewed and stable  Post vital signs: Reviewed and stable  Last Vitals:  Vitals Value Taken Time  BP 100/60   Temp 98   Pulse 80 09/23/23 1335  Resp 17 09/23/23 1335  SpO2 96 % 09/23/23 1335  Vitals shown include unfiled device data.  Last Pain:  Vitals:   09/23/23 0645  TempSrc: Oral  PainSc: 0-No pain      Patients Stated Pain Goal: 0 (09/21/23 2024)  Complications: No notable events documented.

## 2023-09-23 NOTE — Anesthesia Procedure Notes (Signed)
Procedure Name: Intubation Date/Time: 09/23/2023 8:00 AM  Performed by: Darryl Nestle, CRNAPre-anesthesia Checklist: Patient identified, Emergency Drugs available, Suction available and Patient being monitored Patient Re-evaluated:Patient Re-evaluated prior to induction Oxygen Delivery Method: Circle system utilized Preoxygenation: Pre-oxygenation with 100% oxygen Induction Type: IV induction Ventilation: Mask ventilation without difficulty Laryngoscope Size: Mac and 3 Grade View: Grade I Tube type: Oral Tube size: 8.0 mm Number of attempts: 1 Airway Equipment and Method: Stylet Placement Confirmation: ETT inserted through vocal cords under direct vision, positive ETCO2 and breath sounds checked- equal and bilateral Secured at: 23 cm Tube secured with: Tape Dental Injury: Teeth and Oropharynx as per pre-operative assessment

## 2023-09-23 NOTE — Anesthesia Procedure Notes (Signed)
Central Venous Catheter Insertion Performed by: Val Eagle, MD, anesthesiologist Start/End12/24/2024 6:54 AM, 09/23/2023 7:18 AM Patient location: Pre-op. Preanesthetic checklist: patient identified, IV checked, site marked, risks and benefits discussed, surgical consent, monitors and equipment checked, pre-op evaluation, timeout performed and anesthesia consent Position: supine Lidocaine 1% used for infiltration and patient sedated Hand hygiene performed  and maximum sterile barriers used  Catheter size: 8.5 Fr Sheath introducer Procedure performed using ultrasound guided technique. Ultrasound Notes:anatomy identified, needle tip was noted to be adjacent to the nerve/plexus identified, no ultrasound evidence of intravascular and/or intraneural injection and image(s) printed for medical record Attempts: 1 Following insertion, line sutured and dressing applied. Post procedure assessment: blood return through all ports, free fluid flow and no air  Patient tolerated the procedure well with no immediate complications.

## 2023-09-24 ENCOUNTER — Inpatient Hospital Stay (HOSPITAL_COMMUNITY): Payer: Medicare Other

## 2023-09-24 DIAGNOSIS — I214 Non-ST elevation (NSTEMI) myocardial infarction: Secondary | ICD-10-CM | POA: Diagnosis not present

## 2023-09-24 LAB — PREPARE PLATELET PHERESIS: Unit division: 0

## 2023-09-24 LAB — GLUCOSE, CAPILLARY
Glucose-Capillary: 107 mg/dL — ABNORMAL HIGH (ref 70–99)
Glucose-Capillary: 110 mg/dL — ABNORMAL HIGH (ref 70–99)
Glucose-Capillary: 116 mg/dL — ABNORMAL HIGH (ref 70–99)
Glucose-Capillary: 120 mg/dL — ABNORMAL HIGH (ref 70–99)
Glucose-Capillary: 122 mg/dL — ABNORMAL HIGH (ref 70–99)
Glucose-Capillary: 126 mg/dL — ABNORMAL HIGH (ref 70–99)
Glucose-Capillary: 126 mg/dL — ABNORMAL HIGH (ref 70–99)
Glucose-Capillary: 131 mg/dL — ABNORMAL HIGH (ref 70–99)
Glucose-Capillary: 132 mg/dL — ABNORMAL HIGH (ref 70–99)
Glucose-Capillary: 138 mg/dL — ABNORMAL HIGH (ref 70–99)
Glucose-Capillary: 140 mg/dL — ABNORMAL HIGH (ref 70–99)
Glucose-Capillary: 89 mg/dL (ref 70–99)
Glucose-Capillary: 93 mg/dL (ref 70–99)
Glucose-Capillary: 99 mg/dL (ref 70–99)
Glucose-Capillary: 99 mg/dL (ref 70–99)

## 2023-09-24 LAB — BASIC METABOLIC PANEL
Anion gap: 8 (ref 5–15)
Anion gap: 8 (ref 5–15)
BUN: 8 mg/dL (ref 8–23)
BUN: 9 mg/dL (ref 8–23)
CO2: 24 mmol/L (ref 22–32)
CO2: 27 mmol/L (ref 22–32)
Calcium: 8.5 mg/dL — ABNORMAL LOW (ref 8.9–10.3)
Calcium: 8.8 mg/dL — ABNORMAL LOW (ref 8.9–10.3)
Chloride: 105 mmol/L (ref 98–111)
Chloride: 101 mmol/L (ref 98–111)
Creatinine, Ser: 0.96 mg/dL (ref 0.61–1.24)
Creatinine, Ser: 0.89 mg/dL (ref 0.61–1.24)
GFR, Estimated: >60 mL/min (ref >60)
GFR, Estimated: >60 mL/min (ref >60)
Glucose, Bld: 127 mg/dL — ABNORMAL HIGH (ref 70–99)
Glucose, Bld: 114 mg/dL — ABNORMAL HIGH (ref 70–99)
Potassium: 4 mmol/L (ref 3.5–5.1)
Potassium: 4.1 mmol/L (ref 3.5–5.1)
Sodium: 137 mmol/L (ref 135–145)
Sodium: 136 mmol/L (ref 135–145)

## 2023-09-24 LAB — BPAM PLATELET PHERESIS
Blood Product Expiration Date: 202412252359
ISSUE DATE / TIME: 202412241217
Unit Type and Rh: 5100

## 2023-09-24 LAB — CBC
HCT: 25.8 % — ABNORMAL LOW (ref 39.0–52.0)
HCT: 25.6 % — ABNORMAL LOW (ref 39.0–52.0)
Hemoglobin: 8.7 g/dL — ABNORMAL LOW (ref 13.0–17.0)
Hemoglobin: 8.4 g/dL — ABNORMAL LOW (ref 13.0–17.0)
MCH: 31.3 pg (ref 26.0–34.0)
MCH: 31.1 pg (ref 26.0–34.0)
MCHC: 33.7 g/dL (ref 30.0–36.0)
MCHC: 32.8 g/dL (ref 30.0–36.0)
MCV: 92.8 fL (ref 80.0–100.0)
MCV: 94.8 fL (ref 80.0–100.0)
Platelets: 76 10*3/uL — ABNORMAL LOW (ref 150–400)
Platelets: 74 10*3/uL — ABNORMAL LOW (ref 150–400)
RBC: 2.78 MIL/uL — ABNORMAL LOW (ref 4.22–5.81)
RBC: 2.7 MIL/uL — ABNORMAL LOW (ref 4.22–5.81)
RDW: 13.3 % (ref 11.5–15.5)
RDW: 13.6 % (ref 11.5–15.5)
WBC: 6.7 10*3/uL (ref 4.0–10.5)
WBC: 5.6 10*3/uL (ref 4.0–10.5)
nRBC: 0 % (ref 0.0–0.2)
nRBC: 0 % (ref 0.0–0.2)

## 2023-09-24 LAB — MAGNESIUM
Magnesium: 2 mg/dL (ref 1.7–2.4)
Magnesium: 1.9 mg/dL (ref 1.7–2.4)

## 2023-09-24 MED ORDER — INSULIN ASPART 100 UNIT/ML IJ SOLN: 0.0000 [IU] | Freq: Three times a day (TID) | INTRAMUSCULAR | Status: DC

## 2023-09-24 NOTE — Progress Notes (Signed)
      [address]       [place] [address]             [phone]      POD # 1  Up in chair Pain pretty well controlled  Blood pressure 120/63, pulse 73, temperature 98.3 F (36.8 C), temperature source Oral, resp. rate 14, height 5\' 10"  (1.778 m), weight 88.3 kg, SpO2 97%.   Intake/Output Summary (Last 24 hours) at 09/24/2023 1719 Last data filed at 09/24/2023 1440 Gross per 24 hour  Intake 3101.65 ml  Output 1495 ml  Net 1606.65 ml   CBG well controlled Creatinine 0.9, K 4.1 Hct 26, PLT 74k  Doing well POD # 1  Sayra Frisby C. [name], MD [facility] [phone]

## 2023-09-24 NOTE — Progress Notes (Signed)
   Patient Name: Jesse Coleman Date of Encounter: 09/24/2023 [facility] Cardiologist: Donato Schultz, MD   Interval Summary  .    Mild nausea  No CP. Does hurt when coughing Wife's father died on Christmas from MI years ago.   Vital Signs .    Vitals:   09/24/23 0615 09/24/23 0630 09/24/23 0645 09/24/23 0700  BP: (!) 140/72 134/64 (!) 140/71 114/61  Pulse: 81 86 84 75  Resp: 14 20 18 14   Temp:      TempSrc:      SpO2: 98% 100% 90% 93%  Weight:      Height:        Intake/Output Summary (Last 24 hours) at 09/24/2023 0752 Last data filed at 09/24/2023 0656 Gross per 24 hour  Intake 8789.91 ml  Output 2668 ml  Net 6121.91 ml      09/24/2023    5:00 AM 09/23/2023    6:45 AM 09/18/2023    2:59 PM  Last 3 Weights  Weight (lbs) 194 lb 10.7 oz 183 lb 197 lb  Weight (kg) 88.3 kg 83.008 kg 89.359 kg      Telemetry/ECG    No AFIB - Personally Reviewed  Physical Exam .   GEN: No acute distress.  Sitting in chair Neck: No JVD Cardiac: CABG dressing RRR, no murmurs, rubs, or gallops.  Respiratory: Clear to auscultation bilaterally. GI: Soft, nontender, non-distended  MS: No edema  Assessment & Plan .     75 with CAD post op CABG NSTEMI -Progressing. No AFIB -Crestor 20, LDL goal <55 -ASA 325 -Plavix post pacer lead removal for NSTEMI (one year) -Use pillow against chest when coughing   For questions or updates, please contact [facility] Please consult [email or web address] for contact info under        Signed, Donato Schultz, MD

## 2023-09-24 NOTE — Progress Notes (Signed)
1 Day Post-Op Procedure(s) (LRB): CORONARY ARTERY BYPASS GRAFTING (CABG) X 4, USING LEFT INTERNAL MAMMARY ARTERY AND ENDOSCOPICALLY HARVESTED RIGHT GREATER SAPHENOUS VEIN (N/A) TRANSESOPHAGEAL ECHOCARDIOGRAM (TEE) (N/A) LEFT ANTERIOR DESCENDING ARTERY ENDARTERECTOMY, VEIN PATCH ANGIOPLASTY Subjective: Up in chair, denies pain and nausea  Objective: Vital signs in last 24 hours: Temp:  [95.5 F (35.3 C)-99 F (37.2 C)] 98.6 F (37 C) (12/25 0400) Pulse Rate:  [69-100] 84 (12/25 0645) Cardiac Rhythm: Normal sinus rhythm (12/25 0000) Resp:  [11-23] 18 (12/25 0645) BP: (114-154)/(56-83) 140/71 (12/25 0645) SpO2:  [88 %-100 %] 90 % (12/25 0645) Arterial Line BP: (83-192)/(36-78) 147/51 (12/25 0645) FiO2 (%):  [40 %-50 %] 40 % (12/24 1610) Weight:  [88.3 kg] 88.3 kg (12/25 0500)  Hemodynamic parameters for last 24 hours: CVP:  [0 mmHg-39 mmHg] 1 mmHg CO:  [6.9 L/min-8.3 L/min] 7 L/min CI:  [3.4 L/min/m2-4.2 L/min/m2] 3.5 L/min/m2  Intake/Output from previous day: 12/24 0701 - 12/25 0700 In: 8686.9 [P.O.:390; I.V.:3364.2; Blood:616; IV Piggyback:4316.6] Out: 2343 [Urine:1150; Blood:723; Chest Tube:470] Intake/Output this shift: Total I/O In: 2461.4 [P.O.:150; I.V.:372.9; IV Piggyback:1938.6] Out: 555 [Urine:365; Chest Tube:190]  General appearance: alert, cooperative, and no distress Neurologic: intact Heart: regular rate and rhythm Lungs: diminished breath sounds bibasilar  Lab Results: Recent Labs    09/23/23 1941 09/24/23 0457  WBC 8.1 6.7  HGB 9.2* 8.7*  HCT 27.1* 25.8*  PLT 94* 76*   BMET:  Recent Labs    09/23/23 1941 09/24/23 0457  NA 139 137  K 3.7 4.0  CL 106 105  CO2 25 24  GLUCOSE 140* 127*  BUN 10 8  CREATININE 0.92 0.96  CALCIUM 8.3* 8.5*    PT/INR:  Recent Labs    09/23/23 1353  LABPROT 20.2*  INR 1.7*   ABG    Component Value Date/Time   PHART 7.315 (L) 09/23/2023 1731   HCO3 18.9 (L) 09/23/2023 1731   TCO2 20 (L) 09/23/2023 1731    ACIDBASEDEF 7.0 (H) 09/23/2023 1731   O2SAT 93 09/23/2023 1731   CBG (last 3)  Recent Labs    09/23/23 2245 09/23/23 2348 09/24/23 0455  GLUCAP 99 89 122*    Assessment/Plan: S/P Procedure(s) (LRB): CORONARY ARTERY BYPASS GRAFTING (CABG) X 4, USING LEFT INTERNAL MAMMARY ARTERY AND ENDOSCOPICALLY HARVESTED RIGHT GREATER SAPHENOUS VEIN (N/A) TRANSESOPHAGEAL ECHOCARDIOGRAM (TEE) (N/A) LEFT ANTERIOR DESCENDING ARTERY ENDARTERECTOMY, VEIN PATCH ANGIOPLASTY POD # 1 NEURO- intact CV- in SR with good hemodynamics- off milrinone and norepi  Dc A line  ASA for now  After pacing wires out will start Plavix and Eliquis RESP- IS for atelectasis RENAL- creatinine and lytes OK  Diurese ENDO- CBG well controlled- change CBG to AC and HS GI- some distended loops on CXR, no nausea, + BS  Advance diet as tolerated  Reglan Anemia secondary to ABL- Hgb 8.7 Thrombocytopenia- PLT down to 76 K Cardiac rehab  LOS: 6 days    Loreli Slot 09/24/2023

## 2023-09-25 ENCOUNTER — Encounter (HOSPITAL_COMMUNITY): Payer: Self-pay | Admitting: Thoracic Surgery (Cardiothoracic Vascular Surgery)

## 2023-09-25 ENCOUNTER — Inpatient Hospital Stay (HOSPITAL_COMMUNITY): Payer: Medicare Other

## 2023-09-25 LAB — BASIC METABOLIC PANEL
Anion gap: 5 (ref 5–15)
BUN: 8 mg/dL (ref 8–23)
CO2: 28 mmol/L (ref 22–32)
Calcium: 8.6 mg/dL — ABNORMAL LOW (ref 8.9–10.3)
Chloride: 101 mmol/L (ref 98–111)
Creatinine, Ser: 0.89 mg/dL (ref 0.61–1.24)
GFR, Estimated: >60 mL/min (ref >60)
Glucose, Bld: 112 mg/dL — ABNORMAL HIGH (ref 70–99)
Potassium: 4.2 mmol/L (ref 3.5–5.1)
Sodium: 134 mmol/L — ABNORMAL LOW (ref 135–145)

## 2023-09-25 LAB — CBC
HCT: 25 % — ABNORMAL LOW (ref 39.0–52.0)
Hemoglobin: 8.2 g/dL — ABNORMAL LOW (ref 13.0–17.0)
MCH: 30.7 pg (ref 26.0–34.0)
MCHC: 32.8 g/dL (ref 30.0–36.0)
MCV: 93.6 fL (ref 80.0–100.0)
Platelets: 79 10*3/uL — ABNORMAL LOW (ref 150–400)
RBC: 2.67 MIL/uL — ABNORMAL LOW (ref 4.22–5.81)
RDW: 13.7 % (ref 11.5–15.5)
WBC: 4.6 10*3/uL (ref 4.0–10.5)
nRBC: 0 % (ref 0.0–0.2)

## 2023-09-25 LAB — SURGICAL PATHOLOGY

## 2023-09-25 LAB — GLUCOSE, CAPILLARY
Glucose-Capillary: 111 mg/dL — ABNORMAL HIGH (ref 70–99)
Glucose-Capillary: 132 mg/dL — ABNORMAL HIGH (ref 70–99)
Glucose-Capillary: 112 mg/dL — ABNORMAL HIGH (ref 70–99)

## 2023-09-25 MED ORDER — SODIUM CHLORIDE 0.9% FLUSH
3.0000 mL | Freq: Two times a day (BID) | INTRAVENOUS | Status: DC
  Administered 2023-09-25 – 2023-09-30 (×9): 3 mL via INTRAVENOUS

## 2023-09-25 MED ORDER — SODIUM CHLORIDE 0.9% FLUSH: 3.0000 mL | INTRAVENOUS | Status: DC | PRN

## 2023-09-25 MED ORDER — LORAZEPAM 1 MG PO TABS
1.0000 mg | ORAL_TABLET | Freq: Three times a day (TID) | ORAL | Status: DC | PRN
  Administered 2023-09-25 – 2023-09-28 (×5): 1 mg via ORAL
  Filled 2023-09-25 (×5): qty 1

## 2023-09-25 MED ORDER — ~~LOC~~ CARDIAC SURGERY, PATIENT & FAMILY EDUCATION: Freq: Once | Status: AC

## 2023-09-25 MED ORDER — CLOPIDOGREL BISULFATE 75 MG PO TABS
75.0000 mg | ORAL_TABLET | Freq: Every day | ORAL | Status: DC
  Administered 2023-09-25 – 2023-09-30 (×6): 75 mg via ORAL
  Filled 2023-09-25 (×6): qty 1

## 2023-09-25 MED ORDER — FUROSEMIDE 40 MG PO TABS
40.0000 mg | ORAL_TABLET | Freq: Every day | ORAL | Status: DC
  Administered 2023-09-25 – 2023-09-27 (×3): 40 mg via ORAL
  Filled 2023-09-25 (×3): qty 1

## 2023-09-25 MED ORDER — SODIUM CHLORIDE 0.9 % IV SOLN: 250.0000 mL | INTRAVENOUS | Status: AC | PRN

## 2023-09-25 MED ORDER — POTASSIUM CHLORIDE CRYS ER 20 MEQ PO TBCR
20.0000 meq | EXTENDED_RELEASE_TABLET | Freq: Every day | ORAL | Status: DC
  Administered 2023-09-25 – 2023-09-27 (×3): 20 meq via ORAL
  Filled 2023-09-25: qty 1
  Filled 2023-09-25 (×3): qty 2
  Filled 2023-09-25: qty 1

## 2023-09-25 NOTE — Progress Notes (Signed)
   09/25/23 1308  Vitals  Temp 97.6 F (36.4 C)  Temp Source Oral  BP 118/63  MAP (mmHg) 80  BP Location Right Arm  BP Method Automatic  Patient Position (if appropriate) Lying  Pulse Rate 73  ECG Heart Rate 76  Resp 18  MEWS COLOR  MEWS Score Color Green  Oxygen Therapy  SpO2 98 %  O2 Device Nasal Cannula  O2 Flow Rate (L/min) 1 L/min  MEWS Score  MEWS Temp 0  MEWS Systolic 0  MEWS Pulse 0  MEWS RR 0  MEWS LOC 0  MEWS Score 0   Patient arrived from [facility] into 4e23, patient palced on monitor and vital signs obtained CCMD made aware patient on [id number] telemetry box. Family at bedside. Bedside RN aware. Svara Twyman, Randall An rN

## 2023-09-25 NOTE — Progress Notes (Addendum)
[facility] DAILY ICU PROGRESS NOTE                   [address]            [place] [address]          [phone]   2 Days Post-Op Procedure(s) (LRB): CORONARY ARTERY BYPASS GRAFTING (CABG) X 4, USING LEFT INTERNAL MAMMARY ARTERY AND ENDOSCOPICALLY HARVESTED RIGHT GREATER SAPHENOUS VEIN (N/A) TRANSESOPHAGEAL ECHOCARDIOGRAM (TEE) (N/A) LEFT ANTERIOR DESCENDING ARTERY ENDARTERECTOMY, VEIN PATCH ANGIOPLASTY  Total Length of Stay:  LOS: 7 days   Subjective: Feeling better, some sputum production  Objective: Vital signs in last 24 hours: Temp:  [97.3 F (36.3 C)-98.3 F (36.8 C)] 97.3 F (36.3 C) (12/26 0400) Pulse Rate:  [67-98] 75 (12/26 0645) Cardiac Rhythm: Normal sinus rhythm (12/26 0000) Resp:  [8-24] 9 (12/26 0645) BP: (97-147)/(54-86) 128/67 (12/26 0645) SpO2:  [81 %-100 %] 98 % (12/26 0645) Arterial Line BP: (132)/(45) 132/45 (12/25 0800) Weight:  [92.5 kg] 92.5 kg (12/26 0500)  Filed Weights   09/23/23 0645 09/24/23 0500 09/25/23 0500  Weight: 83 kg 88.3 kg 92.5 kg    Weight change: 4.2 kg   Hemodynamic parameters for last 24 hours: CVP:  [0 mmHg] 0 mmHg  Intake/Output from previous day: 12/25 0701 - 12/26 0700 In: 306.1 [P.O.:100; I.V.:6; IV Piggyback:200.1] Out: 1575 [Urine:1155; Chest Tube:420]  Intake/Output this shift: No intake/output data recorded.  Current Meds: Scheduled Meds:  acetaminophen  1,000 mg Oral Q6H   Or   acetaminophen (TYLENOL) oral liquid 160 mg/5 mL  1,000 mg Per Tube Q6H   alfuzosin  10 mg Oral Daily   aspirin EC  325 mg Oral Daily   Or   aspirin  324 mg Per Tube Daily   bisacodyl  10 mg Oral Daily   Or   bisacodyl  10 mg Rectal Daily   bromocriptine  1.25 mg Oral QHS   Chlorhexidine Gluconate Cloth  6 each Topical Daily   docusate sodium  200 mg Oral Daily   DULoxetine  30 mg Oral Daily   insulin aspart  0-15 Units Subcutaneous TID WC   metoprolol tartrate  12.5 mg Oral BID   Or   metoprolol tartrate   12.5 mg Per Tube BID   pantoprazole  40 mg Oral Daily   rosuvastatin  20 mg Oral Daily   sodium chloride flush  3 mL Intravenous Q12H   sodium chloride flush  3-10 mL Intravenous Q12H   sodium chloride flush  3-10 mL Intravenous Q12H   Continuous Infusions:  dexmedetomidine (PRECEDEX) IV infusion Stopped (09/23/23 1647)   nitroGLYCERIN Stopped (09/23/23 1336)   norepinephrine (LEVOPHED) Adult infusion Stopped (09/24/23 0515)   phenylephrine (NEO-SYNEPHRINE) Adult infusion Stopped (09/23/23 1337)   PRN Meds:.metoprolol tartrate, midazolam, morphine injection, ondansetron (ZOFRAN) IV, mouth rinse, oxyCODONE, sodium chloride flush, sodium chloride flush, sodium chloride flush, traMADol  General appearance: alert, cooperative, and no distress Heart: regular rate and rhythm Lungs: clear to auscultation bilaterally Abdomen: mild distension, soft, non tender Extremities: + LE edema Wound: dressings CDI  Lab Results: CBC: Recent Labs    09/24/23 1617 09/25/23 0349  WBC 5.6 4.6  HGB 8.4* 8.2*  HCT 25.6* 25.0*  PLT 74* 79*   BMET:  Recent Labs    09/24/23 1617 09/25/23 0349  NA 136 134*  K 4.1 4.2  CL 101 101  CO2 27 28  GLUCOSE 114* 112*  BUN 9 8  CREATININE 0.89  0.89  CALCIUM 8.8* 8.6*    CMET: Lab Results  Component Value Date   WBC 4.6 09/25/2023   HGB 8.2 (L) 09/25/2023   HCT 25.0 (L) 09/25/2023   PLT 79 (L) 09/25/2023   GLUCOSE 112 (H) 09/25/2023   CHOL 176 09/20/2023   TRIG 35 09/20/2023   HDL 89 09/20/2023   LDLCALC 80 09/20/2023   ALT 10 09/21/2023   AST 32 09/21/2023   NA 134 (L) 09/25/2023   K 4.2 09/25/2023   CL 101 09/25/2023   CREATININE 0.89 09/25/2023   BUN 8 09/25/2023   CO2 28 09/25/2023   INR 1.7 (H) 09/23/2023   HGBA1C 5.1 09/22/2023      PT/INR:  Recent Labs    09/23/23 1353  LABPROT 20.2*  INR 1.7*   Radiology: No results found.   Assessment/Plan: S/P Procedure(s) (LRB): CORONARY ARTERY BYPASS GRAFTING (CABG) X 4, USING  LEFT INTERNAL MAMMARY ARTERY AND ENDOSCOPICALLY HARVESTED RIGHT GREATER SAPHENOUS VEIN (N/A) TRANSESOPHAGEAL ECHOCARDIOGRAM (TEE) (N/A) LEFT ANTERIOR DESCENDING ARTERY ENDARTERECTOMY, VEIN PATCH ANGIOPLASTY POD#2 1 afebrile, sBP 97-147, sinus rhythm,occas PVC no inotropes or pressors 2 O2 sats good on 2 liters 3 weight approx 9 kg>preop, fair UOP, normal renal fxn- should be diurese further 4 CT 420 ml/24h- no air leak, 140 in last 12 h- remove soon 5 CXR minor atx 6 BS good control, normal HgA1C preop 7 expected ABLA stable 8 thrombocytopenia , platelets fairly stable at 79 K- not on lovenox 8 routine pulm hygiene and rehab modalities   Rowe Clack PA-C Pager [phone] 09/25/2023 7:12 AM  Patient seen and examined, agree with above Will dc pacing wires, chest tubes, central line, Foley Start Plavix today for endarterectomy Plan to resume Eliquis tomorrow  Viviann Spare C. [name], MD [facility] [phone]

## 2023-09-25 NOTE — Plan of Care (Signed)
  Problem: Education: Goal: Understanding of cardiac disease, CV risk reduction, and recovery process will improve Outcome: Progressing Goal: Individualized Educational Video(s) Outcome: Progressing   Problem: Activity: Goal: Ability to tolerate increased activity will improve Outcome: Progressing   Problem: Cardiac: Goal: Ability to achieve and maintain adequate cardiovascular perfusion will improve Outcome: Progressing   Problem: Health Behavior/Discharge Planning: Goal: Ability to safely manage health-related needs after discharge will improve Outcome: Progressing   Problem: Education: Goal: Understanding of CV disease, CV risk reduction, and recovery process will improve Outcome: Progressing Goal: Individualized Educational Video(s) Outcome: Progressing   Problem: Activity: Goal: Ability to return to baseline activity level will improve Outcome: Progressing   Problem: Cardiovascular: Goal: Ability to achieve and maintain adequate cardiovascular perfusion will improve Outcome: Progressing   Problem: Education: Goal: Knowledge of General Education information will improve Description: Including pain rating scale, medication(s)/side effects and non-pharmacologic comfort measures Outcome: Progressing   Problem: Health Behavior/Discharge Planning: Goal: Ability to manage health-related needs will improve Outcome: Progressing   Problem: Clinical Measurements: Goal: Ability to maintain clinical measurements within normal limits will improve Outcome: Progressing Goal: Will remain free from infection Outcome: Progressing Goal: Diagnostic test results will improve Outcome: Progressing Goal: Respiratory complications will improve Outcome: Progressing Goal: Cardiovascular complication will be avoided Outcome: Progressing   Problem: Activity: Goal: Risk for activity intolerance will decrease Outcome: Progressing

## 2023-09-26 DIAGNOSIS — E785 Hyperlipidemia, unspecified: Secondary | ICD-10-CM | POA: Diagnosis not present

## 2023-09-26 DIAGNOSIS — I10 Essential (primary) hypertension: Secondary | ICD-10-CM | POA: Diagnosis not present

## 2023-09-26 DIAGNOSIS — I255 Ischemic cardiomyopathy: Secondary | ICD-10-CM | POA: Diagnosis not present

## 2023-09-26 DIAGNOSIS — I214 Non-ST elevation (NSTEMI) myocardial infarction: Secondary | ICD-10-CM | POA: Diagnosis not present

## 2023-09-26 DIAGNOSIS — Z951 Presence of aortocoronary bypass graft: Secondary | ICD-10-CM

## 2023-09-26 LAB — BASIC METABOLIC PANEL
Anion gap: 4 — ABNORMAL LOW (ref 5–15)
BUN: 9 mg/dL (ref 8–23)
CO2: 31 mmol/L (ref 22–32)
Calcium: 8.6 mg/dL — ABNORMAL LOW (ref 8.9–10.3)
Chloride: 100 mmol/L (ref 98–111)
Creatinine, Ser: 0.95 mg/dL (ref 0.61–1.24)
GFR, Estimated: >60 mL/min (ref >60)
Glucose, Bld: 91 mg/dL (ref 70–99)
Potassium: 4.3 mmol/L (ref 3.5–5.1)
Sodium: 135 mmol/L (ref 135–145)

## 2023-09-26 LAB — CBC
HCT: 23.8 % — ABNORMAL LOW (ref 39.0–52.0)
Hemoglobin: 7.7 g/dL — ABNORMAL LOW (ref 13.0–17.0)
MCH: 30.4 pg (ref 26.0–34.0)
MCHC: 32.4 g/dL (ref 30.0–36.0)
MCV: 94.1 fL (ref 80.0–100.0)
Platelets: 94 10*3/uL — ABNORMAL LOW (ref 150–400)
RBC: 2.53 MIL/uL — ABNORMAL LOW (ref 4.22–5.81)
RDW: 13.8 % (ref 11.5–15.5)
WBC: 3.5 10*3/uL — ABNORMAL LOW (ref 4.0–10.5)
nRBC: 0 % (ref 0.0–0.2)

## 2023-09-26 MED ORDER — ROSUVASTATIN CALCIUM 20 MG PO TABS
40.0000 mg | ORAL_TABLET | Freq: Every day | ORAL | Status: DC
  Administered 2023-09-26 – 2023-09-29 (×4): 40 mg via ORAL
  Filled 2023-09-26 (×4): qty 2

## 2023-09-26 MED ORDER — APIXABAN 5 MG PO TABS
5.0000 mg | ORAL_TABLET | Freq: Two times a day (BID) | ORAL | Status: DC
  Administered 2023-09-26 – 2023-09-30 (×9): 5 mg via ORAL
  Filled 2023-09-26 (×9): qty 1

## 2023-09-26 NOTE — Progress Notes (Addendum)
3 Days Post-Op Procedure(s) (LRB): CORONARY ARTERY BYPASS GRAFTING (CABG) X 4, USING LEFT INTERNAL MAMMARY ARTERY AND ENDOSCOPICALLY HARVESTED RIGHT GREATER SAPHENOUS VEIN (N/A) TRANSESOPHAGEAL ECHOCARDIOGRAM (TEE) (N/A) LEFT ANTERIOR DESCENDING ARTERY ENDARTERECTOMY, VEIN PATCH ANGIOPLASTY Subjective: Conts to feel well  Objective: Vital signs in last 24 hours: Temp:  [97.6 F (36.4 C)-98.9 F (37.2 C)] 98.4 F (36.9 C) (12/27 0419) Pulse Rate:  [67-95] 75 (12/27 0419) Cardiac Rhythm: Normal sinus rhythm (12/26 2015) Resp:  [9-23] 18 (12/27 0419) BP: (74-132)/(50-76) 110/63 (12/27 0419) SpO2:  [93 %-100 %] 96 % (12/27 0419) Weight:  [90.9 kg] 90.9 kg (12/27 0619)  Hemodynamic parameters for last 24 hours:    Intake/Output from previous day: 12/26 0701 - 12/27 0700 In: 249.7 [P.O.:240; IV Piggyback:9.7] Out: 1730 [Urine:1700; Chest Tube:30] Intake/Output this shift: No intake/output data recorded.  General appearance: alert, cooperative, and no distress Heart: regular rate and rhythm Lungs: mildly dim in left base Abdomen: benign Extremities: + LE edema Wound: incis healing well  Lab Results: Recent Labs    09/25/23 0349 09/26/23 0317  WBC 4.6 3.5*  HGB 8.2* 7.7*  HCT 25.0* 23.8*  PLT 79* 94*   BMET:  Recent Labs    09/25/23 0349 09/26/23 0317  NA 134* 135  K 4.2 4.3  CL 101 100  CO2 28 31  GLUCOSE 112* 91  BUN 8 9  CREATININE 0.89 0.95  CALCIUM 8.6* 8.6*    PT/INR:  Recent Labs    09/23/23 1353  LABPROT 20.2*  INR 1.7*   ABG    Component Value Date/Time   PHART 7.315 (L) 09/23/2023 1731   HCO3 18.9 (L) 09/23/2023 1731   TCO2 20 (L) 09/23/2023 1731   ACIDBASEDEF 7.0 (H) 09/23/2023 1731   O2SAT 93 09/23/2023 1731   CBG (last 3)  Recent Labs    09/25/23 0352 09/25/23 0733 09/25/23 1113  GLUCAP 111* 132* 112*    Meds Scheduled Meds:  alfuzosin  10 mg Oral Daily   bisacodyl  10 mg Oral Daily   Or   bisacodyl  10 mg Rectal Daily    bromocriptine  1.25 mg Oral QHS   clopidogrel  75 mg Oral Daily   docusate sodium  200 mg Oral Daily   DULoxetine  30 mg Oral Daily   furosemide  40 mg Oral Daily   metoprolol tartrate  12.5 mg Oral BID   Or   metoprolol tartrate  12.5 mg Per Tube BID   pantoprazole  40 mg Oral Daily   potassium chloride SA  20 mEq Oral Daily   rosuvastatin  20 mg Oral Daily   sodium chloride flush  3 mL Intravenous Q12H   Continuous Infusions:  sodium chloride     PRN Meds:.sodium chloride, LORazepam, metoprolol tartrate, ondansetron (ZOFRAN) IV, mouth rinse, oxyCODONE, sodium chloride flush, traMADol  Xrays DG Chest Port 1 View Result Date: 09/25/2023 CLINICAL DATA:  Atelectasis. EXAM: PORTABLE CHEST 1 VIEW COMPARISON:  September 24, 2023. FINDINGS: Stable cardiomediastinal silhouette. Status post coronary artery bypass graft. Bilateral chest tubes are noted without definite pneumothorax. Right internal jugular catheter is unchanged. Left lung is clear. Minimal right basilar subsegmental atelectasis is noted. Bony thorax is unremarkable. IMPRESSION: Bilateral chest tubes are noted without definite pneumothorax. Minimal right basilar subsegmental atelectasis. Electronically Signed   By: Lupita Raider M.D.   On: 09/25/2023 08:57    Assessment/Plan: S/P Procedure(s) (LRB): CORONARY ARTERY BYPASS GRAFTING (CABG) X 4, USING LEFT INTERNAL MAMMARY ARTERY AND ENDOSCOPICALLY  HARVESTED RIGHT GREATER SAPHENOUS VEIN (N/A) TRANSESOPHAGEAL ECHOCARDIOGRAM (TEE) (N/A) LEFT ANTERIOR DESCENDING ARTERY ENDARTERECTOMY, VEIN PATCH ANGIOPLASTY  POD#3 1 afeb, VSS BP well controlled, sinus rhythm, occas PVC 2 O2 sats good on RA 3 good UOP, not all measured,  weight trending lower, normal renal fxn- cont to diurese 4 BS well controlled , not a diabetic 5 reactive thrombocytopenia,  platelet count trending up(94K) , now on plavix and eliquis(starting today) 6 expected ABLA- trending lower , hopefully still  equilibrating, 7.7/23.8- cont to follow closely as close to transfusion threshold 7 cont routine pulm hygiene and rehab 8 poss home 1-2 days if no new issues arise       LOS: 8 days    Jesse Coleman 09/26/2023 Patient seen and examined, agree with findings and plan outlined above On Plavix, restart Eliquis   Viviann Spare C. [name], MD [facility] [phone]

## 2023-09-26 NOTE — Progress Notes (Signed)
Mobility Specialist Progress Note:   09/26/23 1218  Mobility  Activity Ambulated with assistance in hallway  Level of Assistance Standby assist, set-up cues, supervision of patient - no hands on  Assistive Device Front wheel walker  Distance Ambulated (ft) 425 ft  RUE Weight Bearing Per Provider Order NWB  LUE Weight Bearing Per Provider Order NWB  Activity Response Tolerated well  Mobility Referral Yes  Mobility visit 1 Mobility  Mobility Specialist Start Time (ACUTE ONLY) 1147  Mobility Specialist Stop Time (ACUTE ONLY) 1200  Mobility Specialist Time Calculation (min) (ACUTE ONLY) 13 min   Pt received standing in room, agreeable to mobility. C/o slight chest pain, otherwise asx throughout. VSS. Pt returned to bed with call bell in reach and all needs met.   Leory Plowman  Mobility Specialist Please contact via [employer] office at [phone]

## 2023-09-26 NOTE — Progress Notes (Signed)
Pt walking hall independently with RW

## 2023-09-26 NOTE — Progress Notes (Addendum)
Patient Name: Jesse Coleman Date of Encounter: 09/26/2023 [facility] Cardiologist: Donato Schultz, MD   Interval Summary  .    Patient feeling well today. Has some chest pain near his sternal incision. No shortness of breath. Only mild R ankle edema   Vital Signs .    Vitals:   09/25/23 2001 09/26/23 0024 09/26/23 0419 09/26/23 0619  BP: 108/71 (!) 93/59 110/63   Pulse: 95 89 75   Resp: 18 18 18    Temp: 98.9 F (37.2 C) 98.5 F (36.9 C) 98.4 F (36.9 C)   TempSrc: Oral Oral Oral   SpO2: 97% 94% 96%   Weight:    90.9 kg  Height:        Intake/Output Summary (Last 24 hours) at 09/26/2023 1058 Last data filed at 09/25/2023 1410 Gross per 24 hour  Intake --  Output 1170 ml  Net -1170 ml      09/26/2023    6:19 AM 09/25/2023    5:00 AM 09/24/2023    5:00 AM  Last 3 Weights  Weight (lbs) 200 lb 6.4 oz 203 lb 14.8 oz 194 lb 10.7 oz  Weight (kg) 90.9 kg 92.5 kg 88.3 kg      Telemetry/ECG    NSR - Personally Reviewed  Physical Exam .   GEN: No acute distress.  Sitting upright in the recliner  Neck: No JVD Cardiac:  RRR, no murmurs, rubs, or gallops.  Respiratory: Clear to auscultation bilaterally. Normal WOB on room air  GI: Soft, nontender, non-distended  MS: 1+ edema in RLE. No edema in LLE  Assessment & Plan .     NSTEMI  CAD s/p CABG  - Patient previously had LHC in 2014 that showed 3 vessel CAD. He was managed medically at that time  - Presented this admission with chest pain that began when he was outside blowing leaves. hsTn peaked at 7526  - Underwent LHC on 12/20 that showed severe diffuse calcific three-vessel coronary artery disease. Referred to CT surgery for consideration of CABG  - Underwent CABGx4 on 12/24. Doing well post op  - Continue plavix. 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  - Echocardiogram  this admission showed EF 50-55% with regional wall motion abnormalities - Currently euvolemic on exam except for some R lower extremity edema  - Continue metoprolol tartrate 12.5 mg BID  - Continue lasix 40 mg daily  - BP soft- unable to add additional GDMT at this time  History of PE - Unprovoked - Continue eliquis    HLD  - Lipid panel this admission showed LDL 80, HDL 89, triglycerides 35, total cholesterol 176  - Increase crestor to 40 mg daily  - Needs LFTs and lipit panel in 8 weeks    HTN  - BP well controlled on current medications   For questions or updates, please contact  [facility] Please consult [email or web address] for contact info under        Signed, Jonita Albee, PA-C    Patient seen and examined. Agree with assessment and plan. Patient walking in hallway without chest pain. Doing well, day 3 s/p CABG x4 on 09/23/23. On plavix; to resume eliquis today. Plan aggressive lipid lowering with target LDL < 55; now on rosuvastatin 40 mg; monitor as outpatient.   Lennette Bihari, MD, [facility]  09/26/2023 11:48 AM

## 2023-09-26 NOTE — Progress Notes (Signed)
Discussed with pt IS, sternal precautions, exercise, diet, and CRPII. Pt receptive. Will refer to [place] CRPII (pt preference over AP).  [id number] Ethelda Chick BS, ACSM-CEP 09/26/2023 1:57 PM

## 2023-09-26 NOTE — Progress Notes (Signed)
CARDIAC REHAB PHASE I   PRE:  Rate/Rhythm: 75 SR with PVC    BP: sitting 116/69    SpO2: 90-91 RA  MODE:  Ambulation: 470 ft   POST:  Rate/Rhythm: 95 SR    BP: sitting 130/57     SpO2: 93 RA  Pt moved out of bed with min assist. Stood independently. Walked with RW, standby assist. Slow pace but tolerated well, no c/o. To recliner. 2200 ml on IS. Encouraged x2 more walks today. Progressing well.  [id number]   Ethelda Chick BS, ACSM-CEP 09/26/2023 9:07 AM

## 2023-09-27 LAB — BASIC METABOLIC PANEL
Anion gap: 6 (ref 5–15)
BUN: 10 mg/dL (ref 8–23)
CO2: 30 mmol/L (ref 22–32)
Calcium: 9 mg/dL (ref 8.9–10.3)
Chloride: 98 mmol/L (ref 98–111)
Creatinine, Ser: 0.89 mg/dL (ref 0.61–1.24)
GFR, Estimated: >60 mL/min (ref >60)
Glucose, Bld: 94 mg/dL (ref 70–99)
Potassium: 4.2 mmol/L (ref 3.5–5.1)
Sodium: 134 mmol/L — ABNORMAL LOW (ref 135–145)

## 2023-09-27 LAB — CBC
HCT: 24 % — ABNORMAL LOW (ref 39.0–52.0)
Hemoglobin: 7.9 g/dL — ABNORMAL LOW (ref 13.0–17.0)
MCH: 30.7 pg (ref 26.0–34.0)
MCHC: 32.9 g/dL (ref 30.0–36.0)
MCV: 93.4 fL (ref 80.0–100.0)
Platelets: 123 10*3/uL — ABNORMAL LOW (ref 150–400)
RBC: 2.57 MIL/uL — ABNORMAL LOW (ref 4.22–5.81)
RDW: 13.6 % (ref 11.5–15.5)
WBC: 3.4 10*3/uL — ABNORMAL LOW (ref 4.0–10.5)
nRBC: 0 % (ref 0.0–0.2)

## 2023-09-27 LAB — MAGNESIUM: Magnesium: 1.8 mg/dL (ref 1.7–2.4)

## 2023-09-27 MED ORDER — ASPIRIN 81 MG PO TBEC
81.0000 mg | DELAYED_RELEASE_TABLET | Freq: Every day | ORAL | Status: DC
Start: 2023-09-27 — End: 2023-09-27
  Administered 2023-09-27: 81 mg via ORAL
  Filled 2023-09-27: qty 1

## 2023-09-27 MED ORDER — LACTULOSE 10 GM/15ML PO SOLN
20.0000 g | Freq: Every day | ORAL | Status: DC | PRN
  Administered 2023-09-27: 20 g via ORAL
  Filled 2023-09-27: qty 30

## 2023-09-27 MED ORDER — AMIODARONE LOAD VIA INFUSION
150.0000 mg | Freq: Once | INTRAVENOUS | Status: AC
  Administered 2023-09-27: 150 mg via INTRAVENOUS
  Filled 2023-09-27: qty 83.34

## 2023-09-27 MED ORDER — AMIODARONE HCL IN DEXTROSE 360-4.14 MG/200ML-% IV SOLN
30.0000 mg/h | INTRAVENOUS | Status: DC
  Administered 2023-09-27 – 2023-09-28 (×4): 30 mg/h via INTRAVENOUS
  Filled 2023-09-27 (×4): qty 200

## 2023-09-27 MED ORDER — AMIODARONE HCL IN DEXTROSE 360-4.14 MG/200ML-% IV SOLN
60.0000 mg/h | INTRAVENOUS | Status: AC
  Administered 2023-09-27 (×2): 60 mg/h via INTRAVENOUS
  Filled 2023-09-27: qty 200

## 2023-09-27 NOTE — Progress Notes (Addendum)
      [address]       [place] [address]             [phone]      When going to check on the patient he was noted to be in atrial fibrillation with RVR, HR into the 120s but decreased into the 90s after lying in the bed. This developed during ambulation with cardiac rehab. Patient is asymptomatic and vital signs are stable. Will get an EKG and start IV Amiodarone protocol.   Jenny Reichmann, PA-C 09/27/23

## 2023-09-27 NOTE — Progress Notes (Signed)
Patient went to Afib, EKG done and Amiodarone drip started as per the order.   09/27/23 1040  Vitals  BP (!) 100/54  MAP (mmHg) 69  BP Location Right Arm  BP Method Automatic  Patient Position (if appropriate) Lying  Pulse Rate 85  Pulse Rate Source Monitor  ECG Heart Rate 81  Resp 19  MEWS COLOR  MEWS Score Color Green  Oxygen Therapy  SpO2 (!) 88 %  O2 Device Nasal Cannula  O2 Flow Rate (L/min) 1 L/min  MEWS Score  MEWS Temp 0  MEWS Systolic 1  MEWS Pulse 0  MEWS RR 0  MEWS LOC 0  MEWS Score 1     Patient's saturation was <90%, patient was asymptomatic , o2 started via [place] at 1ltrs/min.

## 2023-09-27 NOTE — Progress Notes (Signed)
CARDIAC REHAB PHASE I   PRE:  Rate/Rhythm: Afib/95  BP:  Sitting: 99/60      SaO2: 95%  MODE:  Ambulation: 380 ft   POST:  Rate/Rhythm: Afib/ 104  BP:  Sitting: 111/72      SaO2: 95%  Pt received in bed. Voices has been ambulating in hallway this am, would like to walk again. Pt ambulated with steady gait using rolling walker. No c/o SOB, dizziness or pain. Pt back to bed with call bell in reach. Pulled 2400 on I/S. Encouraged 10x/hour.  Lorin Picket, MS, ACSM EP-C, [facility] 09/27/2023  9-35-10:01

## 2023-09-27 NOTE — Plan of Care (Signed)
  Problem: Cardiac: Goal: Ability to achieve and maintain adequate cardiovascular perfusion will improve Outcome: Progressing   Problem: Activity: Goal: Ability to tolerate increased activity will improve Outcome: Progressing   Problem: Education: Goal: Individualized Educational Video(s) Outcome: Progressing   Problem: Health Behavior/Discharge Planning: Goal: Ability to safely manage health-related needs after discharge will improve Outcome: Progressing   Problem: Activity: Goal: Ability to return to baseline activity level will improve Outcome: Progressing   Problem: Cardiovascular: Goal: Ability to achieve and maintain adequate cardiovascular perfusion will improve Outcome: Progressing   Problem: Clinical Measurements: Goal: Ability to maintain clinical measurements within normal limits will improve Outcome: Progressing

## 2023-09-27 NOTE — Progress Notes (Signed)
HR went up >120/min between 1220 to 1227 while patient was standing up, patient was asymptomatic.  Amio drip continued,

## 2023-09-27 NOTE — Progress Notes (Signed)
Mobility Specialist Progress Note   09/27/23 1621  Mobility  Activity Contraindicated/medical hold   Per RN, patient not appropriate for ambulation at this time given to elevated HR. Ambulated with cardiac rehab earlier and since has been on an Amio Drip. Significant tachycardia with ambulation and standing. MS to hold today, will continue to follow.  [place] Starr, BS EXP Mobility Specialist Please contact via SecureChat or Rehab office at [phone]

## 2023-09-27 NOTE — Progress Notes (Addendum)
[address]       [place] [address]             [phone]      4 Days Post-Op Procedure(s) (LRB): CORONARY ARTERY BYPASS GRAFTING (CABG) X 4, USING LEFT INTERNAL MAMMARY ARTERY AND ENDOSCOPICALLY HARVESTED RIGHT GREATER SAPHENOUS VEIN (N/A) TRANSESOPHAGEAL ECHOCARDIOGRAM (TEE) (N/A) LEFT ANTERIOR DESCENDING ARTERY ENDARTERECTOMY, VEIN PATCH ANGIOPLASTY Subjective: The patient states he feels his pain is about a 2. He is still waiting on a BM.   Objective: Vital signs in last 24 hours: Temp:  [98 F (36.7 C)-98.9 F (37.2 C)] 98 F (36.7 C) (12/28 0801) Pulse Rate:  [79-88] 79 (12/28 0801) Cardiac Rhythm: Normal sinus rhythm (12/27 2203) Resp:  [17-19] 19 (12/28 0801) BP: (100-121)/(56-64) 113/58 (12/28 0801) SpO2:  [90 %-95 %] 90 % (12/28 0801) Weight:  [90.4 kg] 90.4 kg (12/28 0458)  Hemodynamic parameters for last 24 hours:    Intake/Output from previous day: 12/27 0701 - 12/28 0700 In: 360 [P.O.:360] Out: -  Intake/Output this shift: No intake/output data recorded.  General appearance: alert, cooperative, and no distress Neurologic: intact Heart: regular rate and rhythm, S1, S2 normal, no murmur, click, rub or gallop Lungs: clear to auscultation bilaterally Abdomen: soft, non-tender; bowel sounds normal; no masses,  no organomegaly Extremities: edema trace Wound: Clean and dry without sign of infection  Lab Results: Recent Labs    09/26/23 0317 09/27/23 0312  WBC 3.5* 3.4*  HGB 7.7* 7.9*  HCT 23.8* 24.0*  PLT 94* 123*   BMET:  Recent Labs    09/26/23 0317 09/27/23 0312  NA 135 134*  K 4.3 4.2  CL 100 98  CO2 31 30  GLUCOSE 91 94  BUN 9 10  CREATININE 0.95 0.89  CALCIUM 8.6* 9.0    PT/INR: No results for input(s): "LABPROT", "INR" in the last 72 hours. ABG    Component Value Date/Time   PHART 7.315 (L) 09/23/2023 1731   HCO3 18.9 (L) 09/23/2023 1731   TCO2 20 (L) 09/23/2023 1731   ACIDBASEDEF 7.0 (H) 09/23/2023  1731   O2SAT 93 09/23/2023 1731   CBG (last 3)  Recent Labs    09/25/23 0352 09/25/23 0733 09/25/23 1113  GLUCAP 111* 132* 112*    Assessment/Plan: S/P Procedure(s) (LRB): CORONARY ARTERY BYPASS GRAFTING (CABG) X 4, USING LEFT INTERNAL MAMMARY ARTERY AND ENDOSCOPICALLY HARVESTED RIGHT GREATER SAPHENOUS VEIN (N/A) TRANSESOPHAGEAL ECHOCARDIOGRAM (TEE) (N/A) LEFT ANTERIOR DESCENDING ARTERY ENDARTERECTOMY, VEIN PATCH ANGIOPLASTY  CV: SBP 113 this AM. NSR, HR 90s this AM. 10 beat run of NSVT, asymptomatic. Continue Lopresor 12.5mg  BID. On Plavix for LAD endarterectomy and home Eliquis. Per Dr. [name] patient will be on Plavix and Eliquis only, no ASA.   Pulm: Saturating well on RA. Last CXR with minimal right basilar atelectasis. Encourage IS and ambulation  GI: -BM, tolerating a diet. Passing gas. Feels he may have one today. Will add lactulose PRN.   Endo: Mild hyponatremia, Na 134. Likely due to diuresis, monitor.   Renal: Cr 0.89. +2lbs from preop weight. Continue Lasix 40mg  daily and potassium supplement.   Expected postop ABLA: H/H 7.9/24, trending up. Not clinically significant at this time.   Reactive postop thrombocytopenia: Plt 123,000  DVT Prophylaxis: Lovenox held due to thrombocytopenia  Dispo: D/C in the AM if patient has a BM today and HR and rhtyhm remains stable   LOS: 9 days    Jenny Reichmann, PA-C 09/27/2023

## 2023-09-27 NOTE — Progress Notes (Signed)
Pt ambulated x 470 feet with front wheel walker in hall

## 2023-09-28 ENCOUNTER — Inpatient Hospital Stay (HOSPITAL_COMMUNITY): Payer: Medicare Other

## 2023-09-28 LAB — CBC
HCT: 24.8 % — ABNORMAL LOW (ref 39.0–52.0)
Hemoglobin: 8.2 g/dL — ABNORMAL LOW (ref 13.0–17.0)
MCH: 30.4 pg (ref 26.0–34.0)
MCHC: 33.1 g/dL (ref 30.0–36.0)
MCV: 91.9 fL (ref 80.0–100.0)
Platelets: 139 10*3/uL — ABNORMAL LOW (ref 150–400)
RBC: 2.7 MIL/uL — ABNORMAL LOW (ref 4.22–5.81)
RDW: 13.5 % (ref 11.5–15.5)
WBC: 3.8 10*3/uL — ABNORMAL LOW (ref 4.0–10.5)
nRBC: 0 % (ref 0.0–0.2)

## 2023-09-28 LAB — BASIC METABOLIC PANEL
Anion gap: 7 (ref 5–15)
BUN: 10 mg/dL (ref 8–23)
CO2: 28 mmol/L (ref 22–32)
Calcium: 8.9 mg/dL (ref 8.9–10.3)
Chloride: 99 mmol/L (ref 98–111)
Creatinine, Ser: 1.01 mg/dL (ref 0.61–1.24)
GFR, Estimated: >60 mL/min (ref >60)
Glucose, Bld: 113 mg/dL — ABNORMAL HIGH (ref 70–99)
Potassium: 4.4 mmol/L (ref 3.5–5.1)
Sodium: 134 mmol/L — ABNORMAL LOW (ref 135–145)

## 2023-09-28 MED ORDER — CHLORPROMAZINE HCL 10 MG PO TABS: 10.0000 mg | ORAL_TABLET | Freq: Two times a day (BID) | ORAL | Status: DC | PRN

## 2023-09-28 MED ORDER — MAGNESIUM OXIDE -MG SUPPLEMENT 400 (240 MG) MG PO TABS
400.0000 mg | ORAL_TABLET | Freq: Two times a day (BID) | ORAL | Status: AC
  Administered 2023-09-28 – 2023-09-29 (×4): 400 mg via ORAL
  Filled 2023-09-28 (×4): qty 1

## 2023-09-28 MED ORDER — LACTULOSE 10 GM/15ML PO SOLN
20.0000 g | Freq: Two times a day (BID) | ORAL | Status: DC
  Administered 2023-09-28 (×2): 20 g via ORAL
  Filled 2023-09-28 (×2): qty 30

## 2023-09-28 MED ORDER — AMIODARONE IV BOLUS ONLY 150 MG/100ML
150.0000 mg | Freq: Once | INTRAVENOUS | Status: AC
  Administered 2023-09-28: 150 mg via INTRAVENOUS
  Filled 2023-09-28: qty 100

## 2023-09-28 MED ORDER — LORAZEPAM 2 MG/ML IJ SOLN
2.0000 mg | Freq: Once | INTRAMUSCULAR | Status: AC | PRN
  Administered 2023-09-28: 2 mg via INTRAVENOUS
  Filled 2023-09-28: qty 1

## 2023-09-28 NOTE — Progress Notes (Addendum)
[address]       [place] [address]             [phone]      5 Days Post-Op Procedure(s) (LRB): CORONARY ARTERY BYPASS GRAFTING (CABG) X 4, USING LEFT INTERNAL MAMMARY ARTERY AND ENDOSCOPICALLY HARVESTED RIGHT GREATER SAPHENOUS VEIN (N/A) TRANSESOPHAGEAL ECHOCARDIOGRAM (TEE) (N/A) LEFT ANTERIOR DESCENDING ARTERY ENDARTERECTOMY, VEIN PATCH ANGIOPLASTY Subjective: The patient's biggest complaint is his new onset right eye blurriness and darkness. He otherwise has no new complaints.  Objective: Vital signs in last 24 hours: Temp:  [97.8 F (36.6 C)-98.4 F (36.9 C)] 98.3 F (36.8 C) (12/29 0404) Pulse Rate:  [71-131] 76 (12/29 0404) Cardiac Rhythm: Atrial fibrillation (12/28 2100) Resp:  [17-19] 18 (12/29 0404) BP: (94-122)/(51-72) 95/51 (12/29 0404) SpO2:  [55 %-100 %] 93 % (12/29 0404) Weight:  [89 kg] 89 kg (12/29 0609)  Hemodynamic parameters for last 24 hours:    Intake/Output from previous day: 12/28 0701 - 12/29 0700 In: 837 [P.O.:440; I.V.:397] Out: 1500 [Urine:1500] Intake/Output this shift: No intake/output data recorded.  General appearance: alert, cooperative, and no distress Neurologic:  loss of visual field in the right eye, otherwise intact without acute abnormality Heart: irregularly irregular rhythm Lungs: clear to auscultation bilaterally Abdomen: soft, non-tender; bowel sounds normal; no masses,  no organomegaly Extremities: swelling and ecchymosis of right leg down into the ankle (side of EVH), left leg without edema Wound: Clean and dry incisions without sign of infection  Lab Results: Recent Labs    09/27/23 0312 09/28/23 0234  WBC 3.4* 3.8*  HGB 7.9* 8.2*  HCT 24.0* 24.8*  PLT 123* 139*   BMET:  Recent Labs    09/27/23 0312 09/28/23 0234  NA 134* 134*  K 4.2 4.4  CL 98 99  CO2 30 28  GLUCOSE 94 113*  BUN 10 10  CREATININE 0.89 1.01  CALCIUM 9.0 8.9    PT/INR: No results for input(s): "LABPROT",  "INR" in the last 72 hours. ABG    Component Value Date/Time   PHART 7.315 (L) 09/23/2023 1731   HCO3 18.9 (L) 09/23/2023 1731   TCO2 20 (L) 09/23/2023 1731   ACIDBASEDEF 7.0 (H) 09/23/2023 1731   O2SAT 93 09/23/2023 1731   CBG (last 3)  Recent Labs    09/25/23 1113  GLUCAP 112*    Assessment/Plan: S/P Procedure(s) (LRB): CORONARY ARTERY BYPASS GRAFTING (CABG) X 4, USING LEFT INTERNAL MAMMARY ARTERY AND ENDOSCOPICALLY HARVESTED RIGHT GREATER SAPHENOUS VEIN (N/A) TRANSESOPHAGEAL ECHOCARDIOGRAM (TEE) (N/A) LEFT ANTERIOR DESCENDING ARTERY ENDARTERECTOMY, VEIN PATCH ANGIOPLASTY  Neuro: Right eye with new onset blurry vision and decreased visual field since this AM. Neuro exam otherwise normal. As discussed with Dr. Leafy Ro neurology was consulted. Patient is on Eliquis so should be protected from stroke.   CV: SBP 95-100 this AM. Switching between atrial fibrillation and NSR this AM, HR 70s-90s this AM but would increase into the 120s with ambulation. On IV Amiodarone, give another bolus vs continue drip? Continue Lopresor 12.5mg  BID, BP restrictive of titration. On Plavix for LAD endarterectomy and home Eliquis, protected from stroke. Per Dr. [name] patient will be on Plavix and Eliquis only, no ASA. Mg 1.8, will supplement. K 4.4 at goal.    Pulm: Saturating well on RA today, patient did require brief 1L O2 yesterday for desaturation on RA. CXR with minimal bibasilar atelectasis. Encourage IS and ambulation   GI: -BM after lactulose yesterday, tolerating a diet. Passing gas. Continue  stool softeners and lactulose   Endo: Mild hyponatremia stable, Na 134. Likely due to diuresis, monitor.    Renal: Cr 1.01. UO 1500/24hrs. Below preop weight. Will d/c Lasix 40mg  daily and potassium supplement.    Expected postop ABLA: H/H 8.2/24.8, trending up. Not clinically significant at this time.    Reactive postop thrombocytopenia: Plt 139,000   DVT Prophylaxis: Lovenox held due to  thrombocytopenia, now ambulating   Dispo: Dispo planning, working on rhythm control.    LOS: 10 days    Jenny Reichmann, PA-C 09/28/2023

## 2023-09-28 NOTE — Consult Note (Addendum)
NEUROLOGY CONSULT NOTE   Date of service: September 28, 2023 Patient Name: Jesse Coleman MRN:  [id number] DOB:  06/02/1956 Chief Complaint: "Chest pain" Requesting Provider: Loreli Slot, MD  History of Present Illness  Jesse Coleman is a 67 y.o. male  has a past medical history of CAD (coronary artery disease), Erectile dysfunction, Hematospermia, Hyperlipidemia, Hypertension (2014), Overweight(278.02), and Prolactin-secreting pituitary adenoma (HCC) (1999). who initially presented with chest pain. Cardiac cath showed cath with severe 3 vessel CAD and severely reduced EF. He now is s/p CABG x4 and LAD endarterectomy with a vein patch angioplasty on 12/24 and is currently on Plavix and Eliquis. 12/29 he developed an acute change in his vision. Had Lasik years ago and has had no problems with his vision since then, until the current acute change. He does not currently have an ophthalmologist.    ROS  Comprehensive ROS performed and pertinent positives documented in HPI   Past History   Past Medical History:  Diagnosis Date   CAD (coronary artery disease)    Erectile dysfunction    Hematospermia    Hyperlipidemia    Hypertension 2014   Overweight(278.02)    Prolactin-secreting pituitary adenoma [facility]) 1999   Medical therapy-1999    Past Surgical History:  Procedure Laterality Date   CARDIAC CATHETERIZATION  2014   LAD 40%, D1&D2 70-80% (1.60mm), CFX 20%, OM1 & OM2 90% (1.O mm), OM3 50%, RCA 90% (1.5-1.75 mm), EF 40-45%, med rx   COLONOSCOPY  2008   [place], [place]   CORONARY ARTERY BYPASS GRAFT N/A 09/23/2023   Procedure: CORONARY ARTERY BYPASS GRAFTING (CABG) X 4, USING LEFT INTERNAL MAMMARY ARTERY AND ENDOSCOPICALLY HARVESTED RIGHT GREATER SAPHENOUS VEIN;  Surgeon: Loreli Slot, MD;  Location: [facility] OR;  Service: Open Heart Surgery;  Laterality: N/A;   Dental implant  2007   HERNIA REPAIR  2010   [place], [place]   KNEE SURGERY     LEFT HEART CATH AND  CORONARY ANGIOGRAPHY N/A 09/19/2023   Procedure: LEFT HEART CATH AND CORONARY ANGIOGRAPHY;  Surgeon: Yates Decamp, MD;  Location: [facility] INVASIVE CV LAB;  Service: Cardiovascular;  Laterality: N/A;   PATCH ANGIOPLASTY  09/23/2023   Procedure: LEFT ANTERIOR DESCENDING ARTERY ENDARTERECTOMY, VEIN PATCH ANGIOPLASTY;  Surgeon: Loreli Slot, MD;  Location: [facility] OR;  Service: Open Heart Surgery;;   REFRACTIVE SURGERY Right 2003   TEE WITHOUT CARDIOVERSION N/A 09/23/2023   Procedure: TRANSESOPHAGEAL ECHOCARDIOGRAM (TEE);  Surgeon: Loreli Slot, MD;  Location: [facility] OR;  Service: Open Heart Surgery;  Laterality: N/A;   VASECTOMY  2003    Family History: Family History  Problem Relation Age of Onset   Coronary artery disease Father 36       sudden cardiac death   Emphysema Mother        smoked    Social History  reports that he has never smoked. He has never used smokeless tobacco. He reports current alcohol use of about 1.0 standard drink of alcohol per week. He reports that he does not use drugs.  Allergies  Allergen Reactions   Amoxicillin-Pot Clavulanate Nausea And Vomiting   Erythromycin Nausea And Vomiting   Latex Swelling and Other (See Comments)    Lips swell, but no breathing issues    Medications   Current Facility-Administered Medications:    alfuzosin (UROXATRAL) 24 hr tablet 10 mg, 10 mg, Oral, Daily, Gold, Wayne E, PA-C, 10 mg at 09/28/23 0929   [COMPLETED] amiodarone (NEXTERONE) 1.8 mg/mL load via  infusion 150 mg, 150 mg, Intravenous, Once, 150 mg at 09/27/23 1034 **FOLLOWED BY** [EXPIRED] amiodarone (NEXTERONE PREMIX) 360-4.14 MG/200ML-% (1.8 mg/mL) IV infusion, 60 mg/hr, Intravenous, Continuous, Last Rate: 33.3 mL/hr at 09/27/23 1358, 60 mg/hr at 09/27/23 1358 **FOLLOWED BY** amiodarone (NEXTERONE PREMIX) 360-4.14 MG/200ML-% (1.8 mg/mL) IV infusion, 30 mg/hr, Intravenous, Continuous, Stehler, Oren Bracket, PA-C, Last Rate: 16.67 mL/hr at 09/28/23 0925, 30 mg/hr at  09/28/23 4332   amiodarone (NEXTERONE) 1.5 mg/mL IV bolus only 150 mg, 150 mg, Intravenous, Once, Stehler, Bailey C, PA-C   apixaban (ELIQUIS) tablet 5 mg, 5 mg, Oral, BID, Gold, Wayne E, PA-C, 5 mg at 09/28/23 9518   bisacodyl (DULCOLAX) EC tablet 10 mg, 10 mg, Oral, Daily, 10 mg at 09/28/23 8416 **OR** bisacodyl (DULCOLAX) suppository 10 mg, 10 mg, Rectal, Daily, Gold, Wayne E, PA-C   bromocriptine (PARLODEL) tablet 1.25 mg, 1.25 mg, Oral, QHS, Gold, Wayne E, PA-C, 1.25 mg at 09/27/23 2130   chlorproMAZINE (THORAZINE) tablet 10 mg, 10 mg, Oral, BID PRN, Ronney Lion, Oren Bracket, PA-C   clopidogrel (PLAVIX) tablet 75 mg, 75 mg, Oral, Daily, Gold, Wayne E, PA-C, 75 mg at 09/28/23 6063   docusate sodium (COLACE) capsule 200 mg, 200 mg, Oral, Daily, Gold, Wayne E, PA-C, 200 mg at 09/28/23 0160   DULoxetine (CYMBALTA) DR capsule 30 mg, 30 mg, Oral, Daily, Gold, Wayne E, PA-C, 30 mg at 09/28/23 0928   lactulose (CHRONULAC) 10 GM/15ML solution 20 g, 20 g, Oral, BID, Stehler, Bailey C, PA-C, 20 g at 09/28/23 1093   LORazepam (ATIVAN) tablet 1 mg, 1 mg, Oral, TID PRN, Doylene Canning, Wayne E, PA-C, 1 mg at 09/28/23 0238   magnesium oxide (MAG-OX) tablet 400 mg, 400 mg, Oral, BID, Stehler, Bailey C, PA-C, 400 mg at 09/28/23 2355   metoprolol tartrate (LOPRESSOR) tablet 12.5 mg, 12.5 mg, Oral, BID, 12.5 mg at 09/28/23 7322 **OR** metoprolol tartrate (LOPRESSOR) 25 mg/10 mL oral suspension 12.5 mg, 12.5 mg, Per Tube, BID, Gold, Wayne E, PA-C   metoprolol tartrate (LOPRESSOR) injection 2.5-5 mg, 2.5-5 mg, Intravenous, Q2H PRN, Gold, Wayne E, PA-C   ondansetron (ZOFRAN) injection 4 mg, 4 mg, Intravenous, Q6H PRN, Gold, Wayne E, PA-C, 4 mg at 09/25/23 1720   Oral care mouth rinse, 15 mL, Mouth Rinse, PRN, Gold, Wayne E, PA-C   oxyCODONE (Oxy IR/ROXICODONE) immediate release tablet 5-10 mg, 5-10 mg, Oral, Q3H PRN, Gold, Wayne E, PA-C, 5 mg at 09/24/23 0500   pantoprazole (PROTONIX) EC tablet 40 mg, 40 mg, Oral, Daily, Gold, Wayne  E, PA-C, 40 mg at 09/28/23 0254   rosuvastatin (CRESTOR) tablet 40 mg, 40 mg, Oral, Daily, Jonita Albee, PA-C, 40 mg at 09/27/23 2129   sodium chloride flush (NS) 0.9 % injection 3 mL, 3 mL, Intravenous, Q12H, Gold, Wayne E, PA-C, 3 mL at 09/28/23 0929   sodium chloride flush (NS) 0.9 % injection 3 mL, 3 mL, Intravenous, PRN, Gold, Wayne E, PA-C   traMADol (ULTRAM) tablet 50-100 mg, 50-100 mg, Oral, Q4H PRN, Gold, Wayne E, PA-C, 100 mg at 09/27/23 2001  Vitals   Vitals:   09/27/23 2309 09/28/23 0404 09/28/23 0609 09/28/23 0814  BP: 109/66 (!) 95/51  (!) 100/59  Pulse: 92 76  73  Resp: 18 18  19   Temp: 98.2 F (36.8 C) 98.3 F (36.8 C)  97.8 F (36.6 C)  TempSrc: Oral Oral  Oral  SpO2: 96% 93%  96%  Weight:   89 kg   Height:  Body mass index is 28.14 kg/m.  Physical Exam   Constitutional: Appears well-developed and well-nourished.  Psych: Affect appropriate to situation.  Eyes: No scleral injection.  HENT: No OP obstruction.  Head: Normocephalic.  Cardiovascular: Normal rate and regular rhythm.  Respiratory: Effort normal, non-labored breathing.  GI: Soft.  No distension. There is no tenderness.  Skin: RLE with 2 post surgical sites and ecchymosis. Tender with palpation and movement  Neurologic Examination   Neuro: Mental Status: Patient is awake, alert, oriented to person, place, month, year, and situation. Patient is able to give a clear and coherent history. No signs of aphasia or neglect Cranial Nerves: II: Pupils are equal, round, and reactive to light.  Crescentic homonymous right inferior visual field cut in conjunction with diminished vision with brighter colors in right lower quadrant.   III,IV, VI: EOMI without ptosis or diplopia.  V: Facial sensation is symmetric to temperature VII: Facial movement is symmetric resting and smiling VIII: Hearing is intact to voice X: Palate elevates symmetrically XI: Shoulder shrug is symmetric. XII: Tongue  protrudes midline without atrophy or fasciculations.  Motor: Tone is normal. Bulk is normal.  5/5 x 4 distally.  Sensory: Sensation is symmetric to light touch and temperature in the arms and legs. No extinction to DSS present.  Cerebellar: FNF and HKS are intact in BUE and LLE, deferred to RLE due to pain    Labs/Imaging/Neurodiagnostic studies   CBC:  Recent Labs  Lab 09-30-2023 0312 09/28/23 0234  WBC 3.4* 3.8*  HGB 7.9* 8.2*  HCT 24.0* 24.8*  MCV 93.4 91.9  PLT 123* 139*    Basic Metabolic Panel:  Lab Results  Component Value Date   NA 134 (L) 09/28/2023   K 4.4 09/28/2023   CO2 28 09/28/2023   GLUCOSE 113 (H) 09/28/2023   BUN 10 09/28/2023   CREATININE 1.01 09/28/2023   CALCIUM 8.9 09/28/2023   GFRNONAA >60 09/28/2023   GFRAA 58 (L) 06/12/2019    Lipid Panel:  Lab Results  Component Value Date   LDLCALC 80 09/20/2023    HgbA1c:  Lab Results  Component Value Date   HGBA1C 5.1 09/22/2023    Urine Drug Screen: No results found for: "LABOPIA", "COCAINSCRNUR", "LABBENZ", "AMPHETMU", "THCU", "LABBARB"   Alcohol Level No results found for: "ETH"  INR  Lab Results  Component Value Date   INR 1.7 (H) 09/23/2023    APTT  Lab Results  Component Value Date   APTT 44 (H) 09/23/2023    AED levels: No results found for: "PHENYTOIN", "ZONISAMIDE", "LAMOTRIGINE", "LEVETIRACETA"  MRI Brain(Personally reviewed): No acute intracranial process.   ASSESSMENT  Jesse Coleman is a 67 y.o. male  has a past medical history of CAD (coronary artery disease), Erectile dysfunction, Hematospermia, Hyperlipidemia, Hypertension (2014), Overweight(278.02), and Prolactin-secreting pituitary adenoma (HCC) (1999).  who initially presented with chest pain, he is a s/p CABG x4 and left anterior descending artery endarterectomy with a vein patch angioplasty on 12/24 and is currently on Plavix and Eliquis.  12/29 he developed an acute change in his vision.  - Exam reveals  crescentic homonymous right inferior visual field cut in conjunction with diminished vision with brighter colors in right lower quadrant.   - MRI is negative for acute infarct - Given negative MRI brain, suspect possible small BRAO OD. Most likely embolic secondary to perioperative microembolism.    RECOMMENDATIONS  - Follow up ophthalmology outpatient for formal visual acuity testing, Goldmann perimetry for quantitative visual fields testing and dilated  retinal exam  - Continue Eliquis and Plavix per CT Surgery and for stroke prevention - CTA of head and neck  - Has had recent echocardiogram. No need to repeat - Cardiac telemetry - Statin - BP management per CT Surgery protocol - Stroke Team to follow  ______________________________________________________________________    Patient seen and examined by NP/APP with MD.  Jesse Picker, DNP, FNP-BC [facility] Pager: [phone]  I have seen and examined the patient. I have formulated the assessment and recommendations. 67 y.o. male with a past medical history of CAD who initially presented with chest pain. He is a s/p CABG x4 and left anterior descending artery endarterectomy with a vein patch angioplasty on 12/24 and is currently on Plavix and Eliquis.  12/29 he developed an acute change in his vision. Exam reveals crescentic homonymous right inferior visual field cut in conjunction with diminished vision with brighter colors in right lower quadrant. MRI is negative for acute infarct. Given negative MRI brain, suspect possible small BRAO OD. Most likely embolic secondary to perioperative microembolism. Recommendations as above.  Electronically signed: Dr. Caryl Pina

## 2023-09-28 NOTE — Progress Notes (Signed)
Patient called the RN to the room is having blurred vision in right eye, per patient peripheral vision is normal but forward vision is somewhat blurry. Neuro checks are completely unremarkable, minus the blurred vision. Can see number of fingers in all fields of view will pass on to dayshift

## 2023-09-28 NOTE — Anesthesia Postprocedure Evaluation (Signed)
Anesthesia Post Note  Patient: Jesse Coleman  Procedure(s) Performed: CORONARY ARTERY BYPASS GRAFTING (CABG) X 4, USING LEFT INTERNAL MAMMARY ARTERY AND ENDOSCOPICALLY HARVESTED RIGHT GREATER SAPHENOUS VEIN (Chest) TRANSESOPHAGEAL ECHOCARDIOGRAM (TEE) LEFT ANTERIOR DESCENDING ARTERY ENDARTERECTOMY, VEIN PATCH ANGIOPLASTY     Patient location during evaluation: SICU Anesthesia Type: General Level of consciousness: sedated Pain management: pain level controlled Vital Signs Assessment: post-procedure vital signs reviewed and stable Respiratory status: patient remains intubated per anesthesia plan Cardiovascular status: stable Postop Assessment: no apparent nausea or vomiting Anesthetic complications: no   No notable events documented.                Jesse Coleman

## 2023-09-29 ENCOUNTER — Inpatient Hospital Stay (HOSPITAL_COMMUNITY): Payer: Medicare Other

## 2023-09-29 DIAGNOSIS — I214 Non-ST elevation (NSTEMI) myocardial infarction: Secondary | ICD-10-CM | POA: Diagnosis not present

## 2023-09-29 DIAGNOSIS — H34231 Retinal artery branch occlusion, right eye: Secondary | ICD-10-CM

## 2023-09-29 DIAGNOSIS — I4819 Other persistent atrial fibrillation: Secondary | ICD-10-CM | POA: Diagnosis not present

## 2023-09-29 LAB — BASIC METABOLIC PANEL
Anion gap: 9 (ref 5–15)
BUN: 10 mg/dL (ref 8–23)
CO2: 26 mmol/L (ref 22–32)
Calcium: 8.7 mg/dL — ABNORMAL LOW (ref 8.9–10.3)
Chloride: 98 mmol/L (ref 98–111)
Creatinine, Ser: 0.98 mg/dL (ref 0.61–1.24)
GFR, Estimated: >60 mL/min (ref >60)
Glucose, Bld: 103 mg/dL — ABNORMAL HIGH (ref 70–99)
Potassium: 3.8 mmol/L (ref 3.5–5.1)
Sodium: 133 mmol/L — ABNORMAL LOW (ref 135–145)

## 2023-09-29 MED ORDER — LACTULOSE 10 GM/15ML PO SOLN
20.0000 g | Freq: Once | ORAL | Status: AC
Start: 2023-09-29 — End: 2023-09-29
  Administered 2023-09-29: 20 g via ORAL
  Filled 2023-09-29: qty 30

## 2023-09-29 MED ORDER — LORAZEPAM 1 MG PO TABS
1.0000 mg | ORAL_TABLET | Freq: Two times a day (BID) | ORAL | Status: DC | PRN
  Administered 2023-09-29: 1 mg via ORAL
  Filled 2023-09-29: qty 1

## 2023-09-29 MED ORDER — SORBITOL 70 % SOLN
30.0000 mL | Freq: Once | Status: AC
  Administered 2023-09-29: 30 mL via ORAL
  Filled 2023-09-29: qty 30

## 2023-09-29 MED ORDER — LORAZEPAM 1 MG PO TABS
1.0000 mg | ORAL_TABLET | Freq: Every day | ORAL | Status: DC
  Administered 2023-09-29 – 2023-09-30 (×2): 1 mg via ORAL
  Filled 2023-09-29 (×2): qty 1

## 2023-09-29 MED ORDER — POTASSIUM CHLORIDE CRYS ER 20 MEQ PO TBCR
30.0000 meq | EXTENDED_RELEASE_TABLET | Freq: Once | ORAL | Status: AC
  Administered 2023-09-29: 30 meq via ORAL
  Filled 2023-09-29: qty 1

## 2023-09-29 MED ORDER — AMIODARONE HCL 200 MG PO TABS
400.0000 mg | ORAL_TABLET | Freq: Two times a day (BID) | ORAL | Status: DC
  Administered 2023-09-29 – 2023-09-30 (×3): 400 mg via ORAL
  Filled 2023-09-29 (×3): qty 2

## 2023-09-29 MED FILL — Heparin Sodium (Porcine) Inj 1000 Unit/ML: Qty: 1000 | Status: AC

## 2023-09-29 MED FILL — Lidocaine HCl Local Soln Prefilled Syringe 100 MG/5ML (2%): INTRAMUSCULAR | Qty: 5 | Status: AC

## 2023-09-29 MED FILL — Mannitol IV Soln 20%: INTRAVENOUS | Qty: 500 | Status: AC

## 2023-09-29 MED FILL — Potassium Chloride Inj 2 mEq/ML: INTRAVENOUS | Qty: 40 | Status: AC

## 2023-09-29 MED FILL — Sodium Bicarbonate IV Soln 8.4%: INTRAVENOUS | Qty: 100 | Status: AC

## 2023-09-29 MED FILL — Heparin Sodium (Porcine) Inj 1000 Unit/ML: INTRAMUSCULAR | Qty: 20 | Status: AC

## 2023-09-29 MED FILL — Electrolyte-R (PH 7.4) Solution: INTRAVENOUS | Qty: 3000 | Status: AC

## 2023-09-29 MED FILL — Magnesium Sulfate Inj 50%: INTRAMUSCULAR | Qty: 10 | Status: AC

## 2023-09-29 MED FILL — Sodium Chloride IV Soln 0.9%: INTRAVENOUS | Qty: 2000 | Status: AC

## 2023-09-29 NOTE — Progress Notes (Addendum)
      [address]       [place] [address]             [phone]        6 Days Post-Op Procedure(s) (LRB): CORONARY ARTERY BYPASS GRAFTING (CABG) X 4, USING LEFT INTERNAL MAMMARY ARTERY AND ENDOSCOPICALLY HARVESTED RIGHT GREATER SAPHENOUS VEIN (N/A) TRANSESOPHAGEAL ECHOCARDIOGRAM (TEE) (N/A) LEFT ANTERIOR DESCENDING ARTERY ENDARTERECTOMY, VEIN PATCH ANGIOPLASTY  Subjective: Event yesterday am noted. Patient states his vision is no worse but no better. He has not had a bowel movement in a few days.  Objective: Vital signs in last 24 hours: Temp:  [97.7 F (36.5 C)-98.3 F (36.8 C)] 98 F (36.7 C) (12/30 0257) Pulse Rate:  [73-104] 83 (12/30 0257) Cardiac Rhythm: Atrial fibrillation;Bundle branch block (12/29 1900) Resp:  [17-19] 17 (12/30 0257) BP: (91-136)/(54-96) 91/58 (12/30 0257) SpO2:  [92 %-99 %] 92 % (12/30 0257) Weight:  [89.1 kg] 89.1 kg (12/30 0257)  Pre op weight 83 kg Current Weight  09/29/23 89.1 kg      Intake/Output from previous day: No intake/output data recorded.   Physical Exam:  Cardiovascular: IRRR IRRR Pulmonary: Clear to auscultation bilaterally Abdomen: Soft, non tender, bowel sounds present. Extremities: Mild bilateral lower extremity edema. Ecchymosis right thigh Wounds: Clean and dry.  No erythema or signs of infection. Neurologic: no focal deficit. Per patient, right eye about the same as yesterday  Lab Results: CBC: Recent Labs    09/27/23 0312 09/28/23 0234  WBC 3.4* 3.8*  HGB 7.9* 8.2*  HCT 24.0* 24.8*  PLT 123* 139*   BMET:  Recent Labs    09/28/23 0234 09/29/23 0319  NA 134* 133*  K 4.4 3.8  CL 99 98  CO2 28 26  GLUCOSE 113* 103*  BUN 10 10  CREATININE 1.01 0.98  CALCIUM 8.9 8.7*    PT/INR:  Lab Results  Component Value Date   INR 1.7 (H) 09/23/2023   INR 1.1 09/22/2023   INR 1.05 01/29/2013   ABG:  INR: Will add last result for INR, ABG once components are confirmed Will add last  4 CBG results once components are confirmed  Assessment/Plan:  1. CV - Previous a fib. Given Amiodarone bolus yesterday as a fib rate with ambulation 120's. . On Amiodarone drip, Lopressor 12.5 mg bid, Plavix 75 mg daily (LAD endarterectomy), and Apixaban 5 mg bid. Will transition to oral Amiodarone. 2.  Pulmonary - On room air. Encourage incentive spirometer. 3. Above pre op weight, requires further diuresis but with BP soft this am will re assess and likely give tomorrow 4.  Expected post op acute blood loss anemia - Last H and H 8.2 and 24.8 5. Neurology-patient with new onset right blurry vision yesterday am. MRI brain showed no acute abnormality. Neurology evaluated-suspect possible small BRAO OD. Most likely embolic secondary to perioperative microembolism. Patient will need CTA of neck (ordered by neurology yesterday) and follow up with ophthalmology as outpatient. 6. Supplement potassium 7. Mild hyponatremia-sodium 133 8. Mild thrombocytopenia-last platelets up to 139,000 9. LOC constipation 10. Anxiety-per patient, he takes Ativan daily and has so for years. Will schedule 1 mg daily and BID PRN  Donielle M ZimmermanPA-C 7:00 AM Patient seen and examined, agree with findings and plan outlined above  Viviann Spare C. [name], MD [facility] [phone]

## 2023-09-29 NOTE — Progress Notes (Signed)
Called CT to inquire about when CTA of head and neck would be done. They advised it would be early morning before they would be able to get it completed.

## 2023-09-29 NOTE — Progress Notes (Addendum)
Pt in bed with visitors present. Pt readjusted his hips while we talked by putting all weight in arms. Discussed with pt sternal precautions however he continued to bear weight in his arms on the next opportunity. Family present for discussion. He has the core strength to "walk his hips" but forgets. Pt prefers to ambulate again with family later (has had x2 walks today). Also encouraged IS and sitting up in recliner. Later pt seen in hall ambulating short distance with family and RW. HR 117 Afib (80s afib in bed) 1350-1400 Ethelda Chick BS, ACSM-CEP 09/29/2023 2:42 PM

## 2023-09-29 NOTE — Progress Notes (Signed)
STROKE TEAM PROGRESS NOTE   SUBJECTIVE (INTERVAL HISTORY) His wife is at the bedside.  Overall his condition is stable. He stated that he woke up 2am on 12/28 and found out he had some vision loss at left visual field on the right eye only. No change since then, he was put back on eliquis on 12/27. He still has PAF and cardiology on board. MRI negative.    OBJECTIVE Temp:  [97.7 F (36.5 C)-98.3 F (36.8 C)] 98 F (36.7 C) (12/30 0844) Pulse Rate:  [36-90] 36 (12/30 0844) Cardiac Rhythm: Atrial fibrillation;Bundle branch block (12/30 0700) Resp:  [17-18] 18 (12/30 0844) BP: (91-136)/(58-96) 110/62 (12/30 0844) SpO2:  [92 %-99 %] 92 % (12/30 0844) Weight:  [89.1 kg] 89.1 kg (12/30 0257)  Recent Labs  Lab 09/24/23 1924 09/24/23 2345 09/25/23 0352 09/25/23 0733 09/25/23 1113  GLUCAP 138* 131* 111* 132* 112*   Recent Labs  Lab 09/23/23 1941 09/24/23 0457 09/24/23 1617 09/25/23 0349 09/26/23 0317 09/27/23 0312 09/27/23 1020 09/28/23 0234 09/29/23 0319  NA 139 137 136 134* 135 134*  --  134* 133*  K 3.7 4.0 4.1 4.2 4.3 4.2  --  4.4 3.8  CL 106 105 101 101 100 98  --  99 98  CO2 25 24 27 28 31 30   --  28 26  GLUCOSE 140* 127* 114* 112* 91 94  --  113* 103*  BUN 10 8 9 8 9 10   --  10 10  CREATININE 0.92 0.96 0.89 0.89 0.95 0.89  --  1.01 0.98  CALCIUM 8.3* 8.5* 8.8* 8.6* 8.6* 9.0  --  8.9 8.7*  MG 2.2 2.0 1.9  --   --   --  1.8  --   --    No results for input(s): "AST", "ALT", "ALKPHOS", "BILITOT", "PROT", "ALBUMIN" in the last 168 hours. Recent Labs  Lab 09/24/23 1617 09/25/23 0349 09/26/23 0317 09/27/23 0312 09/28/23 0234  WBC 5.6 4.6 3.5* 3.4* 3.8*  HGB 8.4* 8.2* 7.7* 7.9* 8.2*  HCT 25.6* 25.0* 23.8* 24.0* 24.8*  MCV 94.8 93.6 94.1 93.4 91.9  PLT 74* 79* 94* 123* 139*   No results for input(s): "CKTOTAL", "CKMB", "CKMBINDEX", "TROPONINI" in the last 168 hours. No results for input(s): "LABPROT", "INR" in the last 72 hours. No results for input(s):  "COLORURINE", "LABSPEC", "PHURINE", "GLUCOSEU", "HGBUR", "BILIRUBINUR", "KETONESUR", "PROTEINUR", "UROBILINOGEN", "NITRITE", "LEUKOCYTESUR" in the last 72 hours.  Invalid input(s): "APPERANCEUR"     Component Value Date/Time   CHOL 176 09/20/2023 0319   TRIG 35 09/20/2023 0319   HDL 89 09/20/2023 0319   CHOLHDL 2.0 09/20/2023 0319   VLDL 7 09/20/2023 0319   LDLCALC 80 09/20/2023 0319   Lab Results  Component Value Date   HGBA1C 5.1 09/22/2023   No results found for: "LABOPIA", "COCAINSCRNUR", "LABBENZ", "AMPHETMU", "THCU", "LABBARB"  No results for input(s): "ETH" in the last 168 hours.  I have personally reviewed the radiological images below and agree with the radiology interpretations.  MR BRAIN WO CONTRAST Result Date: 09/28/2023 CLINICAL DATA:  Neuro deficit, acute, stroke suspected. EXAM: MRI HEAD WITHOUT CONTRAST TECHNIQUE: Multiplanar, multiecho pulse sequences of the brain and surrounding structures were obtained without intravenous contrast. COMPARISON:  MRI brain 09/15/2019. FINDINGS: Brain: No acute infarct or hemorrhage. Stable mild chronic small-vessel disease. No mass or midline shift. No hydrocephalus or extra-axial collection. No abnormal susceptibility. Vascular: Normal flow voids. Skull and upper cervical spine: Normal marrow signal. Sinuses/Orbits: No acute findings. Other: None. IMPRESSION: No  acute intracranial process. Electronically Signed   By: Orvan Falconer M.D.   On: 09/28/2023 12:55   DG Chest 2 View Result Date: 09/28/2023 CLINICAL DATA:  67 year old male status post CABG EXAM: CHEST - 2 VIEW COMPARISON:  Portable chest 09/25/2023 and earlier. FINDINGS: AP and lateral views 0551 hours. All lines and tubes removed. Mediastinal contours are within normal limits. Visualized tracheal air column is within normal limits. Mildly improved lung volumes. No pneumothorax or pulmonary edema. Opacified posterior costophrenic angles on the lateral, no air bronchograms or  other confluent opacity. Stable visualized osseous structures. Visible bowel-gas pattern within normal limits. IMPRESSION: Lines and tubes removed. Improved lung volumes with no pneumothorax or pulmonary edema. Lower lobe costophrenic angle atelectasis or small effusions. Electronically Signed   By: Odessa Fleming M.D.   On: 09/28/2023 08:50   DG Chest Port 1 View Result Date: 09/25/2023 CLINICAL DATA:  Atelectasis. EXAM: PORTABLE CHEST 1 VIEW COMPARISON:  September 24, 2023. FINDINGS: Stable cardiomediastinal silhouette. Status post coronary artery bypass graft. Bilateral chest tubes are noted without definite pneumothorax. Right internal jugular catheter is unchanged. Left lung is clear. Minimal right basilar subsegmental atelectasis is noted. Bony thorax is unremarkable. IMPRESSION: Bilateral chest tubes are noted without definite pneumothorax. Minimal right basilar subsegmental atelectasis. Electronically Signed   By: Lupita Raider M.D.   On: 09/25/2023 08:57   DG Chest Port 1 View Result Date: 09/24/2023 CLINICAL DATA:  67 year old male status post CABG. EXAM: PORTABLE CHEST 1 VIEW COMPARISON:  Chest x-ray 09/23/2023. FINDINGS: Patient has been extubated. There is a right-sided internal jugular central venous catheter with tip terminating in the superior cavoatrial junction. Bilateral chest tubes are stable in position. Lung volumes are very low. Bibasilar opacities have increased, likely reflecting increasing postoperative atelectasis. Small bilateral pleural effusions. No appreciable pneumothorax. No evidence of pulmonary edema. Heart size appears upper limits of normal. Upper mediastinal contours are within normal limits. Status post median sternotomy for CABG. IMPRESSION: 1. Support apparatus and postoperative changes, as above. 2. Persistent low lung volumes with increasing bibasilar postoperative atelectasis and small bilateral pleural effusions. Electronically Signed   By: Trudie Reed M.D.   On:  09/24/2023 08:42   DG Chest Port 1 View Result Date: 09/23/2023 CLINICAL DATA:  Status post CABG EXAM: PORTABLE CHEST 1 VIEW COMPARISON:  09/18/2023 FINDINGS: Status post interval median sternotomy and CABG. Cardiomegaly. Support apparatus includes endotracheal tube and esophagogastric tube, appropriately positioned, as well as right neck vascular catheter and mediastinal and chest tubes. Layering right-greater-than-left pleural effusions. No acute airspace opacity or pneumothorax. No acute osseous findings. IMPRESSION: 1. Status post interval median sternotomy and CABG. Support apparatus includes endotracheal tube and esophagogastric tube, appropriately positioned, as well as right neck vascular catheter and mediastinal and chest tubes. 2. Layering right-greater-than-left pleural effusions. No acute airspace opacity or pneumothorax. Electronically Signed   By: Jearld Lesch M.D.   On: 09/23/2023 14:17   VAS US DOPPLER PRE CABG Result Date: 09/21/2023 PREOPERATIVE VASCULAR EVALUATION Patient Name:  DALLIS PHENIX  Date of Exam:   09/20/2023 Medical Rec #: [id number]       Accession #:    [id number] Date of Birth: 1955-11-09       Patient Gender: M Patient Age:   66 years Exam Location:  [facility] Procedure:      VAS US DOPPLER PRE CABG Referring Phys: Viviann Spare HENDRICKSON --------------------------------------------------------------------------------  Indications:  Pre-CABG. Risk Factors: Hypertension, hyperlipidemia, coronary artery disease. Performing Technologist: Shona Simpson  Examination  Guidelines: A complete evaluation includes B-mode imaging, spectral Doppler, color Doppler, and power Doppler as needed of all accessible portions of each vessel. Bilateral testing is considered an integral part of a complete examination. Limited examinations for reoccurring indications may be performed as noted.  Right Carotid Findings: +----------+--------+--------+--------+--------+------------------+            PSV cm/sEDV cm/sStenosisDescribeComments           +----------+--------+--------+--------+--------+------------------+ CCA Prox  93      18                                         +----------+--------+--------+--------+--------+------------------+ CCA Distal95      21                      intimal thickening +----------+--------+--------+--------+--------+------------------+ ICA Prox  95      27      1-39%   calcific                   +----------+--------+--------+--------+--------+------------------+ ICA Mid   86      25                                         +----------+--------+--------+--------+--------+------------------+ ICA Distal95      25                                         +----------+--------+--------+--------+--------+------------------+ ECA       222     17      >50%    calcific                   +----------+--------+--------+--------+--------+------------------+ +----------+--------+-------+--------+------------+           PSV cm/sEDV cmsDescribeArm Pressure +----------+--------+-------+--------+------------+ Subclavian112     0                           +----------+--------+-------+--------+------------+ +---------+--------+--+--------+--+ VertebralPSV cm/s55EDV cm/s14 +---------+--------+--+--------+--+ Left Carotid Findings: +----------+--------+--------+--------+--------+------------------+           PSV cm/sEDV cm/sStenosisDescribeComments           +----------+--------+--------+--------+--------+------------------+ CCA Prox  130     27                                         +----------+--------+--------+--------+--------+------------------+ CCA Distal104     20                      intimal thickening +----------+--------+--------+--------+--------+------------------+ ICA Prox  83      19      1-39%   calcific                   +----------+--------+--------+--------+--------+------------------+  ICA Distal85      23                                         +----------+--------+--------+--------+--------+------------------+ ECA       138  18                                         +----------+--------+--------+--------+--------+------------------+ +----------+--------+--------+--------+------------+ SubclavianPSV cm/sEDV cm/sDescribeArm Pressure +----------+--------+--------+--------+------------+           131     0                            +----------+--------+--------+--------+------------+ +---------+--------+--+--------+--+ VertebralPSV cm/s72EDV cm/s21 +---------+--------+--+--------+--+  ABI Findings: +------------------+-----+---------+ Rt Pressure (mmHg)IndexWaveform  +------------------+-----+---------+ 170                    triphasic +------------------+-----+---------+ 171               1.01 biphasic  +------------------+-----+---------+ 169               0.99 biphasic  +------------------+-----+---------+ +------------------+-----+---------+ Lt Pressure (mmHg)IndexWaveform  +------------------+-----+---------+ 161                              +------------------+-----+---------+ 178               1.05 triphasic +------------------+-----+---------+ 174               1.02 biphasic  +------------------+-----+---------+ +-------+---------------+ ABI/TBIToday's ABI/TBI +-------+---------------+ Right  1.01            +-------+---------------+ Left   1.05            +-------+---------------+  Right Doppler Findings: +--------+--------+---------+ Site    PressureDoppler   +--------+--------+---------+ Brachial170     triphasic +--------+--------+---------+ Radial          triphasic +--------+--------+---------+ Ulnar           triphasic +--------+--------+---------+  Left Doppler Findings: +----------+--------+---------+ Site      PressureDoppler   +----------+--------+---------+ Subclavian         triphasic +----------+--------+---------+ Brachial  161               +----------+--------+---------+ Radial            triphasic +----------+--------+---------+ Ulnar             triphasic +----------+--------+---------+   Summary: Right Carotid: Velocities in the right ICA are consistent with a 1-39% stenosis.                The ECA appears >50% stenosed. Left Carotid: Velocities in the left ICA are consistent with a 1-39% stenosis. Vertebrals:  Bilateral vertebral arteries demonstrate antegrade flow. Subclavians: Normal flow hemodynamics were seen in bilateral subclavian              arteries. Right ABI: Resting right ankle-brachial index is within normal range. Left ABI: Resting left ankle-brachial index is within normal range. Right Upper Extremity: Doppler waveforms decrease 50% with right radial compression. Doppler waveforms decrease >50% with right ulnar compression. Left Upper Extremity: Doppler waveforms decrease >50% with left radial compression. Doppler waveforms remain within normal limits with left ulnar compression.  Electronically signed by Coral Else MD on 09/21/2023 at 12:13:00 AM.    Final    ECHOCARDIOGRAM COMPLETE Result Date: 09/20/2023    ECHOCARDIOGRAM REPORT   Patient Name:   SHAFT TIA Date of Exam: 09/20/2023 Medical Rec #:  [id number]      Height:       70.0 in Accession #:    [id number]  Weight:       197.0 lb Date of Birth:  04/05/1956      BSA:          2.074 m Patient Age:    67 years       BP:           141/72 mmHg Patient Gender: M              HR:           80 bpm. Exam Location:  Inpatient Procedure: 2D Echo, 3D Echo, Cardiac Doppler and Color Doppler Indications:    Dyspnea R06.00  History:        Patient has prior history of Echocardiogram examinations, most                 recent 06/10/2019. Cardiomyopathy, Previous Myocardial Infarction                 and CAD, Signs/Symptoms:Chest Pain; Risk Factors:Dyslipidemia.  Sonographer:    Lucendia Herrlich  RCS Referring Phys: [id number] Yates Decamp IMPRESSIONS  1. Left ventricular ejection fraction, by estimation, is 50 to 55%. Left ventricular ejection fraction by 3D volume is 52 %. The left ventricle has low normal function. The left ventricle demonstrates regional wall motion abnormalities (see scoring diagram/findings for description). Left ventricular diastolic parameters were normal. apical hypokinesis without thrombus.  2. Right ventricular systolic function is normal. The right ventricular size is normal.  3. The mitral valve is grossly normal. Mild mitral valve regurgitation. No evidence of mitral stenosis.  4. The aortic valve is tricuspid. Aortic valve regurgitation is trivial. No aortic stenosis is present. Comparison(s): Prior images reviewed side by side. New apical wall motion abnormalities possible, apex not as well evaluated in 2020 study. FINDINGS  Left Ventricle: Left ventricular ejection fraction, by estimation, is 50 to 55%. Left ventricular ejection fraction by 3D volume is 52 %. The left ventricle has low normal function. The left ventricle demonstrates regional wall motion abnormalities. The  left ventricular internal cavity size was normal in size. Suboptimal image quality limits for assessment of left ventricular hypertrophy. Left ventricular diastolic parameters were normal.  LV Wall Scoring: The apical lateral segment, apical septal segment, apical anterior segment, and apical inferior segment are hypokinetic. The anterior wall, antero-lateral wall, anterior septum, inferior wall, posterior wall, mid inferoseptal segment, and basal inferoseptal segment are normal. Apical hypokinesis without thrombus. Right Ventricle: The right ventricular size is normal. No increase in right ventricular wall thickness. Right ventricular systolic function is normal. Left Atrium: Left atrial size was normal in size. Right Atrium: Right atrial size was normal in size. Pericardium: There is no evidence of pericardial  effusion. Mitral Valve: The mitral valve is grossly normal. Mild mitral valve regurgitation. No evidence of mitral valve stenosis. Tricuspid Valve: The tricuspid valve is normal in structure. Tricuspid valve regurgitation is trivial. No evidence of tricuspid stenosis. Aortic Valve: The aortic valve is tricuspid. Aortic valve regurgitation is trivial. No aortic stenosis is present. Aortic valve peak gradient measures 6.0 mmHg. Pulmonic Valve: The pulmonic valve was not well visualized. Pulmonic valve regurgitation is not visualized. No evidence of pulmonic stenosis. Aorta: The aortic root is normal in size and structure. IAS/Shunts: The atrial septum is grossly normal.  LEFT VENTRICLE PLAX 2D LVIDd:         4.80 cm         Diastology LVIDs:         3.50 cm  LV e' medial:    7.51 cm/s LV PW:         1.10 cm         LV E/e' medial:  8.7 LV IVS:        1.40 cm         LV e' lateral:   9.03 cm/s LVOT diam:     2.00 cm         LV E/e' lateral: 7.3 LV SV:         52 LV SV Index:   25 LVOT Area:     3.14 cm        3D Volume EF                                LV 3D EF:    Left                                             ventricul                                             ar                                             ejection                                             fraction                                             by 3D                                             volume is                                             52 %.                                 3D Volume EF:                                3D EF:        52 %                                LV EDV:       184 ml  LV ESV:       88 ml                                LV SV:        96 ml RIGHT VENTRICLE RV S prime:     10.80 cm/s TAPSE (M-mode): 1.5 cm LEFT ATRIUM             Index        RIGHT ATRIUM           Index LA diam:        4.80 cm 2.31 cm/m   RA Area:     11.40 cm LA Vol (A2C):   51.6 ml 24.88 ml/m  RA Volume:    18.20 ml  8.78 ml/m LA Vol (A4C):   49.7 ml 23.96 ml/m LA Biplane Vol: 52.9 ml 25.51 ml/m  AORTIC VALVE AV Area (Vmax): 2.58 cm AV Vmax:        122.00 cm/s AV Peak Grad:   6.0 mmHg LVOT Vmax:      100.23 cm/s LVOT Vmean:     68.733 cm/s LVOT VTI:       0.165 m  AORTA Ao Root diam: 3.40 cm MITRAL VALVE MV Area (PHT): 6.07 cm    SHUNTS MV Decel Time: 125 msec    Systemic VTI:  0.17 m MR Peak grad: 67.9 mmHg    Systemic Diam: 2.00 cm MR Vmax:      412.00 cm/s MV E velocity: 65.50 cm/s Riley Lam MD Electronically signed by Riley Lam MD Signature Date/Time: 09/20/2023/5:17:41 PM    Final    CARDIAC CATHETERIZATION Addendum Date: 09/19/2023 Left Heart Catheterization 09/19/23: Hemodynamic data: LV 122/0, EDP 10 mmHg.  Ao 108/61, mean 79 mmHg.  No pressure gradient across the aortic valve. Angiographic data: Severe coronary calcification of all the coronary arteries. LV: Marked global hypokinesis.  LVEF 25 to at most 30%. LM: Calcified but large-caliber vessel.  No luminal obstruction. LAD: Severely calcified throughout.  Proximal LAD has eccentric 70 to 80% stenosis. Small D1 and D2 with high-grade ostial stenosis of 90% and diffuse disease.  Moderate size D3 with ostial 99% stenosis.  LAD CTO after D3 origin.  Distal LAD collateralized by ipsilateral and contralateral collaterals.  Distal LAD appears bypassable. LCx: Dominant.  Moderate disease in the ostium and proximal segment of 40%.  Very tiny OM1 and 2 with severe disease, large OM 3 with ostial 90% stenosis.  Distal CX has mild disease. RI: CTO in the ostium.  Ipsilateral collaterals.  The vessel faintly and appears to be small to moderate caliber vessel however large distribution. RCA: Nondominant with diffuse disease with a proximal 90% focal stenosis.  Gives collaterals to distal LAD. Impression and recommendations: Severe diffuse calcific three-vessel coronary artery disease.  Severely reduced LVEF.  Will refer for CT surgery for  consideration for CABG.  Result Date: 09/19/2023 Images from the original result were not included. Left Heart Catheterization 09/19/23: Hemodynamic data: LV 122/0, EDP 10 mmHg.  Ao 108/61, mean 79 mmHg.  No pressure gradient across the aortic valve. Angiographic data: Severe coronary calcification of all the coronary arteries. LV: Marked global hypokinesis.  LVEF 25 to at most 30%. LM: Calcified but large-caliber vessel.  No luminal obstruction. LAD: Severely calcified throughout.  Proximal LAD has eccentric 70 to 80% stenosis. Small D1 and D2 with high-grade ostial stenosis of 90% and diffuse disease.  Moderate size D3, free of disease.  LAD CTO after D3 origin.  Distal LAD collateralized by ipsilateral and contralateral collaterals.  Distal LAD appears bypassable. LCx: Dominant.  Moderate disease in the ostium and proximal segment of 40%.  Very tiny OM1 and 2 with severe disease, large OM 3 with ostial 90% stenosis.  Distal CX has mild disease. RI: CTO in the ostium.  Ipsilateral collaterals.  The vessel faintly and appears to be small to moderate caliber vessel however large distribution. RCA: Nondominant with diffuse disease with a proximal 90% focal stenosis.  Gives collaterals to distal LAD. Impression and recommendations: Severe diffuse calcific three-vessel coronary artery disease.  Severely reduced LVEF.  Will refer for CT surgery for consideration for CABG.   DG Chest Port 1 View Result Date: 09/18/2023 CLINICAL DATA:  Hypoxia EXAM: PORTABLE CHEST 1 VIEW COMPARISON:  Film from earlier in the same day. FINDINGS: Cardiac shadow is stable. Mild aortic calcifications are seen. Lungs are well aerated bilaterally. Increased interstitial changes are noted consistent with mild edema. No bony abnormality is seen. IMPRESSION: Mild interstitial edema.  No acute infiltrate is seen. Electronically Signed   By: Alcide Clever M.D.   On: 09/18/2023 22:28   DG Chest 2 View Result Date: 09/18/2023 CLINICAL DATA:   cp EXAM: CHEST - 2 VIEW COMPARISON:  10/09/2020. FINDINGS: The heart size and mediastinal contours are within normal limits. Both lungs are clear. No pneumothorax or pleural effusion. Aorta is calcified. There are thoracic degenerative changes. IMPRESSION: No acute cardiopulmonary disease. Electronically Signed   By: Layla Maw M.D.   On: 09/18/2023 19:03     PHYSICAL EXAM  Temp:  [97.7 F (36.5 C)-98.3 F (36.8 C)] 98 F (36.7 C) (12/30 0844) Pulse Rate:  [36-90] 36 (12/30 0844) Resp:  [17-18] 18 (12/30 0844) BP: (91-136)/(58-96) 110/62 (12/30 0844) SpO2:  [92 %-99 %] 92 % (12/30 0844) Weight:  [89.1 kg] 89.1 kg (12/30 0257)  General - Well nourished, well developed, in no apparent distress, mildly drowsy after working with PT.  Ophthalmologic - fundi not visualized due to noncooperation.  Cardiovascular - Regular rhythm and rate, not in afib at this time.  Mental Status -  Level of arousal and orientation to time, place, and person were intact. Language including expression, naming, repetition, comprehension was assessed and found intact. Fund of Knowledge was assessed and was intact.  Cranial Nerves II - XII - II - Visual field intact OS, part of the lower visual field vision loss, R>L, only OD. III, IV, VI - Extraocular movements intact. V - Facial sensation intact bilaterally. VII - Facial movement intact bilaterally. VIII - Hearing & vestibular intact bilaterally. X - Palate elevates symmetrically. XI - Chin turning & shoulder shrug intact bilaterally. XII - Tongue protrusion intact.  Motor Strength - The patient's strength was normal in all extremities and pronator drift was absent.  Bulk was normal and fasciculations were absent.   Motor Tone - Muscle tone was assessed at the neck and appendages and was normal.  Reflexes - The patient's reflexes were symmetrical in all extremities and he had no pathological reflexes.  Sensory - Light touch, temperature/pinprick  were assessed and were symmetrical.    Coordination - The patient had normal movements in the hands and feet with no ataxia or dysmetria.  Tremor was absent.  Gait and Station - deferred.   ASSESSMENT/PLAN Jesse Coleman is a 67 y.o. male with history of PE on eliquis, CAD, HTN, HLD, prolactin-producing pituitary  adenoma, meniere's disease admitted for CP s/p cath and found to have severe CAD, s/p CABG on 12/24. Acute onset right eye partial vision loss on 12/28. No TNK given due to outside window.    Probable R BRAO - likely related to postprocedural afib now on [facility] Acute onset right eye part of the lower visual field vision loss  MRI  no acute infarct CTA head and neck pending CUS unremarkable  2D Echo  pending LDL 80 HgbA1c 5.1 Eliquis for VTE prophylaxis Eliquis (apixaban) daily prior to admission, now on plavix and Eliquis (apixaban) daily. 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 Please call with questions. Pt will follow up with stroke clinic NP at [facility] in about 4 weeks. Thanks for the consult.   Marvel Plan, MD PhD Stroke Neurology 09/29/2023 1:39 PM    To contact Stroke Continuity provider, please refer to [email or web address]. After hours, contact General Neurology

## 2023-09-29 NOTE — Progress Notes (Signed)
   Patient Name: Jesse Coleman Date of Encounter: 09/29/2023 [facility] Cardiologist: Donato Schultz, MD   Interval Summary  .    Better day today, hopeful for possible DC tomorrow.   Vital Signs .    Vitals:   09/28/23 1928 09/28/23 2310 09/29/23 0257 09/29/23 0844  BP: 103/60 (!) 136/96 (!) 91/58 110/62  Pulse: 90 90 83 (!) 36  Resp: 18  17 18   Temp: 97.7 F (36.5 C) 98.3 F (36.8 C) 98 F (36.7 C) 98 F (36.7 C)  TempSrc: Oral Oral Oral Oral  SpO2: 94% 96% 92% 92%  Weight:   89.1 kg   Height:       No intake or output data in the 24 hours ending 09/29/23 1023    09/29/2023    2:57 AM 09/28/2023    6:09 AM 09/27/2023    4:58 AM  Last 3 Weights  Weight (lbs) 196 lb 6.9 oz 196 lb 1.6 oz 199 lb 4.7 oz  Weight (kg) 89.1 kg 88.95 kg 90.4 kg      Telemetry/ECG    Atrial fibrillation, rates controlled in the 70s - Personally Reviewed  Physical Exam .   GEN: No acute distress.   Neck: No JVD Cardiac: Irreg Irreg, no murmurs, rubs, or gallops.  Respiratory: Clear to auscultation bilaterally. GI: Soft, nontender, non-distended  MS: RLE swelling, improved   Assessment & Plan .     NSTEMI  CAD s/p CABG  -- Patient previously had LHC in 2014 that showed 3 vessel CAD. He was managed medically at that time. Presented this admission with chest pain that began when he was outside blowing leaves. hsTn peaked at 7526  -- LHC on 12/20 that showed severe diffuse calcific three-vessel coronary artery disease. Underwent CABGx4 on 12/24 with LAD endarterectomy and vein patch -- on plavix. Not on ASA as patient is on eliquis for history of unprovoked PE  -- continue metoprolol, statin  Post op Afib -- episode yesterday with rates in the 120s, has been on IV amiodarone. 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

## 2023-09-29 NOTE — Progress Notes (Addendum)
Mobility Specialist Progress Note:   09/29/23 1155  Mobility  Activity Ambulated with assistance in hallway  Level of Assistance Contact guard assist, steadying assist  Assistive Device Front wheel walker  Distance Ambulated (ft) 120 ft  RUE Weight Bearing Per Provider Order NWB  LUE Weight Bearing Per Provider Order NWB  Activity Response Tolerated well  Mobility Referral Yes  Mobility visit 1 Mobility  Mobility Specialist Start Time (ACUTE ONLY) 1103  Mobility Specialist Stop Time (ACUTE ONLY) 1114  Mobility Specialist Time Calculation (min) (ACUTE ONLY) 11 min   Pre Mobility: 80 HR   During Mobility: 93-102 HR Post Mobility: 85 HR   Pt received in bed, agreeable to mobility. Requesting to use BR prior to ambulation. Void successful. Pt c/o leg weakness and pain in triceps during ambulation. Pt tends to shuffle feet when fatigued needing verbal cues to lift BLE off of ground when walking. VSS throughout. Pt returned to bed with call bell in reach and all needs met. Wife present.         Leory Plowman  Mobility Specialist Please contact via [employer] office at [phone]

## 2023-09-29 NOTE — Plan of Care (Signed)
°  Problem: Education: °Goal: Understanding of cardiac disease, CV risk reduction, and recovery process will improve °Outcome: Progressing °Goal: Individualized Educational Video(s) °Outcome: Progressing °  °

## 2023-09-30 ENCOUNTER — Inpatient Hospital Stay (HOSPITAL_COMMUNITY): Payer: Medicare Other

## 2023-09-30 MED ORDER — AMIODARONE HCL 200 MG PO TABS: ORAL_TABLET | ORAL | 1 refills | Status: DC

## 2023-09-30 MED ORDER — IOHEXOL 350 MG/ML SOLN
75.0000 mL | Freq: Once | INTRAVENOUS | Status: AC | PRN
  Administered 2023-09-30: 75 mL via INTRAVENOUS

## 2023-09-30 MED ORDER — FUROSEMIDE 40 MG PO TABS
40.0000 mg | ORAL_TABLET | Freq: Every day | ORAL | Status: DC
  Administered 2023-09-30: 40 mg via ORAL
  Filled 2023-09-30: qty 2

## 2023-09-30 MED ORDER — POTASSIUM CHLORIDE CRYS ER 20 MEQ PO TBCR
20.0000 meq | EXTENDED_RELEASE_TABLET | Freq: Every day | ORAL | Status: DC
  Administered 2023-09-30: 20 meq via ORAL
  Filled 2023-09-30: qty 1

## 2023-09-30 MED ORDER — ROSUVASTATIN CALCIUM 40 MG PO TABS: 40.0000 mg | ORAL_TABLET | ORAL | 1 refills | Status: DC

## 2023-09-30 MED ORDER — CLOPIDOGREL BISULFATE 75 MG PO TABS: 75.0000 mg | ORAL_TABLET | Freq: Every day | ORAL | 1 refills | Status: AC

## 2023-09-30 MED ORDER — TRAMADOL HCL 50 MG PO TABS: 50.0000 mg | ORAL_TABLET | Freq: Four times a day (QID) | ORAL | 0 refills | Status: DC | PRN

## 2023-09-30 MED ORDER — METOPROLOL TARTRATE 25 MG PO TABS: 12.5000 mg | ORAL_TABLET | Freq: Two times a day (BID) | ORAL | 1 refills | Status: DC

## 2023-09-30 NOTE — Progress Notes (Addendum)
      [address]       [place] [address]             [phone]        7 Days Post-Op Procedure(s) (LRB): CORONARY ARTERY BYPASS GRAFTING (CABG) X 4, USING LEFT INTERNAL MAMMARY ARTERY AND ENDOSCOPICALLY HARVESTED RIGHT GREATER SAPHENOUS VEIN (N/A) TRANSESOPHAGEAL ECHOCARDIOGRAM (TEE) (N/A) LEFT ANTERIOR DESCENDING ARTERY ENDARTERECTOMY, VEIN PATCH ANGIOPLASTY  Subjective: Patient did not have bowel movement with laxative yesterday. He is not uncomfortable as has moved his bowels since surgery, just not in a few days. He did walk two times yesterday.  Objective: Vital signs in last 24 hours: Temp:  [97.8 F (36.6 C)-98.5 F (36.9 C)] 97.8 F (36.6 C) (12/31 0408) Pulse Rate:  [36-87] 75 (12/31 0408) Cardiac Rhythm: Atrial fibrillation (12/30 1905) Resp:  [17-18] 18 (12/31 0408) BP: (106-121)/(62-80) 110/64 (12/31 0408) SpO2:  [92 %-100 %] 95 % (12/31 0408) Weight:  [87.6 kg] 87.6 kg (12/31 0500)  Pre op weight 83 kg Current Weight  09/30/23 87.6 kg      Intake/Output from previous day: No intake/output data recorded.   Physical Exam:  Cardiovascular: IRRR IRRR Pulmonary: Clear to auscultation bilaterally Abdomen: Soft, non tender, bowel sounds present. Extremities: Mild bilateral lower extremity edema. Ecchymosis right thigh Wounds: Clean and dry.  No erythema or signs of infection. Neurologic: no focal deficit. Per patient, right eye about the same as yesterday  Lab Results: CBC: Recent Labs    09/28/23 0234  WBC 3.8*  HGB 8.2*  HCT 24.8*  PLT 139*   BMET:  Recent Labs    09/28/23 0234 09/29/23 0319  NA 134* 133*  K 4.4 3.8  CL 99 98  CO2 28 26  GLUCOSE 113* 103*  BUN 10 10  CREATININE 1.01 0.98  CALCIUM  8.9 8.7*    PT/INR:  Lab Results  Component Value Date   INR 1.7 (H) 09/23/2023   INR 1.1 09/22/2023   INR 1.05 01/29/2013   ABG:  INR: Will add last result for INR, ABG once components are confirmed Will add  last 4 CBG results once components are confirmed  Assessment/Plan:  1. CV - A fib with CVR. On Amiodarone  400 mg bid, Lopressor  12.5 mg bid, Plavix  75 mg daily (LAD endarterectomy), and Apixaban  5 mg bid.  2.  Pulmonary - On room air. Encourage incentive spirometer. 3. Above pre op weight, requires further diuresis-BP improved so will give Lasix  orally daily 4.  Expected post op acute blood loss anemia - Last H and H 8.2 and 24.8 5. Neurology-patient with new onset right blurry vision yesterday am. MRI brain showed no acute abnormality. Neurology evaluated-suspect possible small right BRAO OD. Most likely embolic secondary to perioperative microembolism. CTA of head/neck done earlier this am;await results. Per neurology,patient will need to follow up with ophthalmology as outpatient. 6. Mild thrombocytopenia-last platelets up to 139,000 7. Anxiety-per patient, he takes Ativan  daily and has so for years. Will schedule 1 mg daily and BID PRN 8. Disposition-await result of CTA head/neck;Neurology has signed off so will discharge if above shows no acute findings  Donielle M ZimmermanPA-C 6:53 AM  CT Surgery Patient examined, images of todays CTA of neck personally reviewed. He is stable for home discharge, CT shows small expected postop mediastinal hematoma,  Discharge instructions reviewed with patient  patient examined and medical record reviewed,agree with above note. Maude Ferguson 09/30/2023 .

## 2023-09-30 NOTE — TOC Transition Note (Signed)
 Transition of Care (TOC) - Discharge Note Rayfield Gobble RN, BSN Transitions of Care Unit 4E- RN Case Manager See Treatment Team for direct phone #   Patient Details  Name: Jesse Coleman MRN: [id number] Date of Birth: 06/02/1956  Transition of Care [facility]) CM/SW Contact:  Gobble Rayfield Hurst, RN Phone Number: 09/30/2023, 11:41 AM   Clinical Narrative:    Pt stable for transition home today,  Insurance and Status Reviewed: yes Has Medication coverage: yes Patient has Primary Care Physician:  yes Home Environment Reviewed: home w/ wife Prior Level of Function: independent Prior/Current Home Services: none Social Determinants of Health Reviewed: yes Re-Admission Risk Reviewed: yes Transition of Care Needs Reviewed: no TOC needs noted at this time.  Wife to transport home   Final next level of care: Home/Self Care Barriers to Discharge: No Barriers Identified   Patient Goals and CMS Choice Patient states their goals for this hospitalization and ongoing recovery are:: return home   Choice offered to / list presented to : NA      Discharge Placement                 Home      Discharge Plan and Services Additional resources added to the After Visit Summary for   In-house Referral: NA Discharge Planning Services: NA Post Acute Care Choice: NA          DME Arranged: N/A DME Agency: NA       HH Arranged: NA HH Agency: NA        Social Drivers of Health (SDOH) Interventions SDOH Screenings   Food Insecurity: No Food Insecurity (09/19/2023)  Housing: Low Risk  (09/19/2023)  Transportation Needs: No Transportation Needs (09/19/2023)  Utilities: Not At Risk (09/19/2023)  Alcohol Screen: Low Risk  (08/08/2020)  Depression (PHQ2-9): Low Risk  (08/08/2020)  Financial Resource Strain: Low Risk  (08/08/2020)  Physical Activity: Insufficiently Active (08/08/2020)  Social Connections: Moderately Integrated (09/29/2023)  Stress: No Stress Concern Present (08/08/2020)   Tobacco Use: Low Risk  (09/23/2023)     Readmission Risk Interventions    09/30/2023   11:41 AM  Readmission Risk Prevention Plan  Transportation Screening Complete  Home Care Screening Complete  Medication Review (RN CM) Complete

## 2023-09-30 NOTE — Progress Notes (Signed)
 CARDIAC REHAB PHASE I    Pt ready for discharge home. [facility] post op education completed. Referral sent to [facility] for CRP2.   1105-1120  Woodroe Chen, RN BSN 09/30/2023 11:19 AM

## 2023-09-30 NOTE — Plan of Care (Signed)
 Problem: Education: Goal: Understanding of cardiac disease, CV risk reduction, and recovery process will improve Outcome: Adequate for Discharge Goal: Individualized Educational Video(s) Outcome: Adequate for Discharge   Problem: Activity: Goal: Ability to tolerate increased activity will improve Outcome: Adequate for Discharge   Problem: Cardiac: Goal: Ability to achieve and maintain adequate cardiovascular perfusion will improve Outcome: Adequate for Discharge   Problem: Health Behavior/Discharge Planning: Goal: Ability to safely manage health-related needs after discharge will improve Outcome: Adequate for Discharge   Problem: Education: Goal: Understanding of CV disease, CV risk reduction, and recovery process will improve Outcome: Adequate for Discharge Goal: Individualized Educational Video(s) Outcome: Adequate for Discharge   Problem: Activity: Goal: Ability to return to baseline activity level will improve Outcome: Adequate for Discharge   Problem: Cardiovascular: Goal: Ability to achieve and maintain adequate cardiovascular perfusion will improve Outcome: Adequate for Discharge   Problem: Education: Goal: Knowledge of General Education information will improve Description: Including pain rating scale, medication(s)/side effects and non-pharmacologic comfort measures Outcome: Adequate for Discharge   Problem: Health Behavior/Discharge Planning: Goal: Ability to manage health-related needs will improve Outcome: Adequate for Discharge   Problem: Clinical Measurements: Goal: Ability to maintain clinical measurements within normal limits will improve Outcome: Adequate for Discharge Goal: Will remain free from infection Outcome: Adequate for Discharge Goal: Diagnostic test results will improve Outcome: Adequate for Discharge Goal: Respiratory complications will improve Outcome: Adequate for Discharge Goal: Cardiovascular complication will be avoided Outcome:  Adequate for Discharge   Problem: Activity: Goal: Risk for activity intolerance will decrease Outcome: Adequate for Discharge   Problem: Nutrition: Goal: Adequate nutrition will be maintained Outcome: Adequate for Discharge   Problem: Coping: Goal: Level of anxiety will decrease Outcome: Adequate for Discharge   Problem: Elimination: Goal: Will not experience complications related to bowel motility Outcome: Adequate for Discharge Goal: Will not experience complications related to urinary retention Outcome: Adequate for Discharge   Problem: Pain Management: Goal: General experience of comfort will improve Outcome: Adequate for Discharge   Problem: Safety: Goal: Ability to remain free from injury will improve Outcome: Adequate for Discharge   Problem: Skin Integrity: Goal: Risk for impaired skin integrity will decrease Outcome: Adequate for Discharge   Problem: Education: Goal: Will demonstrate proper wound care and an understanding of methods to prevent future damage Outcome: Adequate for Discharge Goal: Knowledge of disease or condition will improve Outcome: Adequate for Discharge Goal: Knowledge of the prescribed therapeutic regimen will improve Outcome: Adequate for Discharge Goal: Individualized Educational Video(s) Outcome: Adequate for Discharge   Problem: Activity: Goal: Risk for activity intolerance will decrease Outcome: Adequate for Discharge   Problem: Cardiac: Goal: Will achieve and/or maintain hemodynamic stability Outcome: Adequate for Discharge   Problem: Clinical Measurements: Goal: Postoperative complications will be avoided or minimized Outcome: Adequate for Discharge   Problem: Respiratory: Goal: Respiratory status will improve Outcome: Adequate for Discharge   Problem: Skin Integrity: Goal: Wound healing without signs and symptoms of infection Outcome: Adequate for Discharge Goal: Risk for impaired skin integrity will decrease Outcome:  Adequate for Discharge   Problem: Urinary Elimination: Goal: Ability to achieve and maintain adequate renal perfusion and functioning will improve Outcome: Adequate for Discharge   Problem: Education: Goal: Ability to describe self-care measures that may prevent or decrease complications (Diabetes Survival Skills Education) will improve Outcome: Adequate for Discharge Goal: Individualized Educational Video(s) Outcome: Adequate for Discharge   Problem: Coping: Goal: Ability to adjust to condition or change in health will improve Outcome: Adequate for Discharge  Problem: Fluid Volume: Goal: Ability to maintain a balanced intake and output will improve Outcome: Adequate for Discharge   Problem: Health Behavior/Discharge Planning: Goal: Ability to identify and utilize available resources and services will improve Outcome: Adequate for Discharge Goal: Ability to manage health-related needs will improve Outcome: Adequate for Discharge   Problem: Metabolic: Goal: Ability to maintain appropriate glucose levels will improve Outcome: Adequate for Discharge   Problem: Nutritional: Goal: Maintenance of adequate nutrition will improve Outcome: Adequate for Discharge Goal: Progress toward achieving an optimal weight will improve Outcome: Adequate for Discharge   Problem: Skin Integrity: Goal: Risk for impaired skin integrity will decrease Outcome: Adequate for Discharge   Problem: Tissue Perfusion: Goal: Adequacy of tissue perfusion will improve Outcome: Adequate for Discharge

## 2023-09-30 NOTE — Progress Notes (Signed)
 Discharge instructions (including medications) discussed with and copy provided to patient/caregiver

## 2023-09-30 NOTE — Care Management Important Message (Signed)
Important Message  Patient Details  Name: Jesse Coleman MRN: [id number] Date of Birth: 1956/04/11   Important Message Given:  Yes - Medicare IM     Renie Ora 09/30/2023, 10:18 AM

## 2023-10-02 ENCOUNTER — Emergency Department (HOSPITAL_COMMUNITY)
Admission: EM | Admit: 2023-10-02 | Discharge: 2023-10-02 | Discharge status: Against Medical Advice | Payer: Medicare Other | Source: Home / Self Care

## 2023-10-02 ENCOUNTER — Other Ambulatory Visit: Payer: Self-pay | Admitting: Physician Assistant

## 2023-10-02 ENCOUNTER — Other Ambulatory Visit: Payer: Self-pay

## 2023-10-02 ENCOUNTER — Encounter (HOSPITAL_COMMUNITY): Payer: Self-pay

## 2023-10-02 ENCOUNTER — Emergency Department (HOSPITAL_COMMUNITY)
Admission: EM | Admit: 2023-10-02 | Discharge: 2023-10-02 | Disposition: A | Discharge status: Home / Self Care | Payer: Medicare Other | Attending: Emergency Medicine | Admitting: Emergency Medicine

## 2023-10-02 ENCOUNTER — Encounter (HOSPITAL_COMMUNITY): Payer: Self-pay | Admitting: *Deleted

## 2023-10-02 DIAGNOSIS — R040 Epistaxis: Secondary | ICD-10-CM | POA: Insufficient documentation

## 2023-10-02 DIAGNOSIS — Z5321 Procedure and treatment not carried out due to patient leaving prior to being seen by health care provider: Secondary | ICD-10-CM | POA: Insufficient documentation

## 2023-10-02 DIAGNOSIS — Z7902 Long term (current) use of antithrombotics/antiplatelets: Secondary | ICD-10-CM | POA: Diagnosis not present

## 2023-10-02 DIAGNOSIS — Z7901 Long term (current) use of anticoagulants: Secondary | ICD-10-CM | POA: Insufficient documentation

## 2023-10-02 DIAGNOSIS — Z91040 Latex allergy status: Secondary | ICD-10-CM | POA: Insufficient documentation

## 2023-10-02 MED ORDER — OXYMETAZOLINE HCL 0.05 % NA SOLN
1.0000 | Freq: Once | NASAL | Status: AC
  Administered 2023-10-02: 1 via NASAL
  Filled 2023-10-02: qty 30

## 2023-10-02 MED ORDER — ROSUVASTATIN CALCIUM 40 MG PO TABS: 40.0000 mg | ORAL_TABLET | Freq: Every evening | ORAL | 1 refills | Status: DC

## 2023-10-02 NOTE — ED Triage Notes (Signed)
 Pt BIB [facility] from home C/O nosebleed. Bleeding controlled now. Seen earlier today for same, per pt needs cauterized.

## 2023-10-02 NOTE — ED Triage Notes (Signed)
 Pt in from home via [facility], pt reported to have a nosebleed onset today 6:30am, pts bleeding controlled upon arrival to ED, had a CABG recently and was discharged home x 3 days ago, denies lightheadedness, denies CP, pt taking Eliques

## 2023-10-02 NOTE — ED Provider Notes (Signed)
 [place] EMERGENCY DEPARTMENT AT [facility] Provider Note   CSN: [id number] Arrival date & time: 10/02/23  9165     History  Chief Complaint  Patient presents with   Epistaxis    Jesse Coleman is a 68 y.o. male.  He is presenting by ambulance after a spontaneous nosebleed this morning.  He said he blew his nose and saw some pink and then had some more steady bleeding from his left nares.  He was just released from the hospital after undergoing a four-vessel CABG.  He is on Eliquis  and Plavix .  He tells me he did have his right nose cauterized remotely.  No dizziness lightheadedness no chest pain or shortness of breath no fevers or chills.  The history is provided by the patient and the EMS personnel.  Epistaxis Location:  L nare Severity:  Moderate Duration:  2 hours Timing:  Constant Progression:  Improving Chronicity:  New Context: anticoagulants   Relieved by:  Applying pressure Associated symptoms: no blood in oropharynx, no cough, no dizziness, no fever and no syncope        Home Medications Prior to Admission medications   Medication Sig Start Date End Date Taking? Authorizing Provider  alfuzosin  (UROXATRAL ) 10 MG 24 hr tablet Take 10 mg by mouth daily. 09/02/23   [provider]  amiodarone  (PACERONE ) 200 MG tablet Take 400 mg two times daily for 3 days;then take 200 mg two times daily for 7 days;then take 200 mg daily thereafater 09/30/23   Zimmerman, Donielle M, PA-C  bromocriptine  (PARLODEL ) 2.5 MG tablet Take 1.25 mg by mouth at bedtime.  01/19/13   [provider]  clopidogrel  (PLAVIX ) 75 MG tablet Take 1 tablet (75 mg total) by mouth daily. 09/30/23   Dwan Kyla HERO, PA-C  DULoxetine  (CYMBALTA ) 30 MG capsule Take 30 mg by mouth daily.    [provider]  ELIQUIS  5 MG TABS tablet TAKE 1 TABLET BY MOUTH TWICE A DAY. Patient taking differently: Take 5 mg by mouth 2 (two) times daily. 01/31/22   Mannam, Praveen, MD   EPINEPHrine  0.3 mg/0.3 mL IJ SOAJ injection Inject 0.3 mLs (0.3 mg total) into the muscle as needed for anaphylaxis. 04/24/19   Silva Zapata, Edwin, MD  LORazepam  (ATIVAN ) 1 MG tablet Take 1 mg by mouth every 8 (eight) hours as needed for anxiety. 01/19/13   [provider]  metoprolol  tartrate (LOPRESSOR ) 25 MG tablet Take 0.5 tablets (12.5 mg total) by mouth 2 (two) times daily. 09/30/23   Dwan Kyla HERO, PA-C  rosuvastatin  (CRESTOR ) 40 MG tablet Take 1 tablet (40 mg total) by mouth once a week. 09/30/23   Dwan Kyla HERO, PA-C  sildenafil (VIAGRA) 100 MG tablet TAKE 1 TABLET BY MOUTH 30CMINUTES PRIOR TO 1HR BEFORE RELATIONS. 04/20/19   [provider]  traMADol  (ULTRAM ) 50 MG tablet Take 1 tablet (50 mg total) by mouth every 6 (six) hours as needed for severe pain (pain score 7-10). 09/30/23   Dwan Kyla HERO, PA-C  vitamin B-12 (CYANOCOBALAMIN ) 100 MCG tablet Take 100 mcg by mouth 3 (three) times a week.    [provider]      Allergies    Amoxicillin-pot clavulanate, Erythromycin, and Latex    Review of Systems   Review of Systems  Constitutional:  Negative for fever.  HENT:  Positive for nosebleeds.   Respiratory:  Negative for cough.   Cardiovascular:  Negative for syncope.  Neurological:  Negative for dizziness.  Physical Exam Updated Vital Signs BP 113/86   Pulse 86   Temp 98 F (36.7 C) (Oral)   SpO2 95%  Physical Exam Vitals and nursing note reviewed.  Constitutional:      Appearance: Normal appearance. He is well-developed.  HENT:     Head: Normocephalic and atraumatic.     Nose:     Comments: He has some dried blood in his left nares.  No gross bleeding at this time. Eyes:     Conjunctiva/sclera: Conjunctivae normal.  Cardiovascular:     Rate and Rhythm: Normal rate.  Pulmonary:     Effort: Pulmonary effort is normal.  Musculoskeletal:     Cervical back: Neck supple.  Skin:    General: Skin is warm and dry.   Neurological:     General: No focal deficit present.     Mental Status: He is alert.     GCS: GCS eye subscore is 4. GCS verbal subscore is 5. GCS motor subscore is 6.     ED Results / Procedures / Treatments   Labs (all labs ordered are listed, but only abnormal results are displayed) Labs Reviewed - No data to display  EKG None  Radiology No results found.  Procedures Epistaxis Management  Date/Time: 10/02/2023 8:30 PM  Performed by: Towana Ozell BROCKS, MD Authorized by: Towana Ozell BROCKS, MD   Consent:    Consent obtained:  Verbal   Consent given by:  Patient   Risks, benefits, and alternatives were discussed: yes   Universal protocol:    Procedure explained and questions answered to patient or proxy's satisfaction: yes     Patient identity confirmed:  Verbally with patient Anesthesia:    Anesthesia method:  None Procedure details:    Treatment site:  L anterior   Repair method: Cottonball with Afrin.   Treatment complexity:  Limited Post-procedure details:    Assessment:  Bleeding stopped   Procedure completion:  Tolerated well, no immediate complications     Medications Ordered in ED Medications  oxymetazoline  (AFRIN) 0.05 % nasal spray 1 spray (has no administration in time range)    ED Course/ Medical Decision Making/ A&P Clinical Course as of 10/02/23 2029  Thu Oct 02, 2023  0911 cottonBall with Afrin placed in the left nares. [MB]  1020 Remove packing.  No active bleeding.  Will continue to observe [MB]  1126 Patient has no recurrence of bleeding. [MB]    Clinical Course User Index [MB] Towana Ozell BROCKS, MD                                 Medical Decision Making Risk OTC drugs.   This patient complains of nosebleed left side; this involves an extensive number of treatment Options and is a complaint that carries with it a high risk of complications and morbidity. The differential includes epistaxis, foreign body, sinusitis Previous records  obtained and reviewed in epic, recent discharge after CABG Cardiac monitoring reviewed, normal sinus rhythm Social determinants considered, no significant barriers Critical Interventions: None  After the interventions stated above, I reevaluated the patient and found patient to be feeling back to baseline and no active bleeding Admission and further testing considered, no indications for admission or further workup at this time.  Discussed further management and return instructions.         Final Clinical Impression(s) / ED Diagnoses Final diagnoses:  Left-sided epistaxis    Rx /  DC Orders ED Discharge Orders     None         Towana Ozell BROCKS, MD 10/02/23 2032

## 2023-10-09 ENCOUNTER — Encounter (HOSPITAL_BASED_OUTPATIENT_CLINIC_OR_DEPARTMENT_OTHER): Payer: Self-pay

## 2023-10-10 ENCOUNTER — Telehealth: Payer: Self-pay

## 2023-10-10 NOTE — Telephone Encounter (Signed)
 October 10, 2023 Jesse Rosina FALCON, RN  AB   10/10/23 11:06 AM Note Patient contacted the office concerned about swelling at the [facility] incision site of his right leg. He is s/p CABG with Dr. Kerrin 12/24. Patient states that this started 30 minutes before the call. It is not painful, soft to touch, and is not red. He did send a picture and [name], [name] is aware. Patient advised that looks like a seroma from the photo. Nothing needed at this time, but we can move his appointment up one week for evaluation. Patient aware and acknowledged receipt.

## 2023-10-10 NOTE — Progress Notes (Signed)
 [address]       Jesse Coleman [address]             [phone]   HPI: Patient returns for routine postoperative follow-up having undergone CABG x 4 by Dr. Luna Salinas on 09/23/2023. The patient's early postoperative recovery while in the hospital was notable for Atrial Fibrillation with RVR.  He also developed bluriness and quadrant vision loss.  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 Since hospital discharge the patient reports overall he is doing well.  He has been having difficulty with nose bleeds where he has had to go the ER.  He does have an appointment set up with ENT.  He is ambulating without difficulty.  His RLE EVH site continues to have a bump present but it does not bother him nor has it gotten any larger.  He is accompanied by his wife and they had multiple questions all of which were addressed.  Current Outpatient Medications  Medication Sig Dispense Refill   alfuzosin  (UROXATRAL ) 10 MG 24 hr tablet Take 10 mg by mouth daily.     amiodarone  (PACERONE ) 200 MG tablet Take 400 mg two times daily for 3 days;then take 200 mg two times daily for 7 days;then take 200 mg daily thereafater 60 tablet 1   bromocriptine  (PARLODEL ) 2.5 MG tablet Take 1.25 mg by mouth at bedtime.      clopidogrel  (PLAVIX ) 75 MG tablet Take 1 tablet (75 mg total) by mouth daily. 30 tablet 1   DULoxetine  (CYMBALTA ) 30 MG capsule Take 30 mg by mouth daily.     ELIQUIS  5 MG TABS tablet TAKE 1 TABLET BY MOUTH TWICE A DAY. (Patient taking differently: Take 5 mg by mouth 2 (two) times daily.) 60 tablet 0   EPINEPHrine  0.3 mg/0.3 mL IJ SOAJ injection Inject 0.3 mLs (0.3 mg total) into the muscle as needed for anaphylaxis. 1 each 0   LORazepam  (ATIVAN ) 1 MG tablet Take 1 mg by mouth every 8 (eight) hours as needed for anxiety.     metoprolol  tartrate (LOPRESSOR ) 25 MG tablet Take 0.5 tablets (12.5 mg total) by mouth 2 (two) times daily. 30 tablet 1   rosuvastatin  (CRESTOR )  40 MG tablet Take 1 tablet (40 mg total) by mouth every evening. 30 tablet 1   sildenafil (VIAGRA) 100 MG tablet TAKE 1 TABLET BY MOUTH 30CMINUTES PRIOR TO 1HR BEFORE RELATIONS.     traMADol  (ULTRAM ) 50 MG tablet Take 1 tablet (50 mg total) by mouth every 6 (six) hours as needed for severe pain (pain score 7-10). 30 tablet 0   vitamin B-12 (CYANOCOBALAMIN ) 100 MCG tablet Take 100 mcg by mouth 3 (three) times a week.     No current facility-administered medications for this visit.    Physical Exam:  BP 133/78   Pulse 75   Resp 18   Ht 5\' 10"  (1.778 m)   Wt 190 lb (86.2 kg)   SpO2 95% Comment: RA  BMI 27.26 kg/m   Gen: NAD Heart: RRR Lungs: CTA bilaterally Incisions: Right EVH site.. small eschar present, small seroma below site, sternotomy well healed  Diagnostic Tests:  CXR: no significant pleural effusion, pneumothorax present  A/P:  S/P CABG x 4-- doing very well.. continue Plavix  due to Endarterectomy performed in OR Hypercoagulable state, H/O PE- on Apixiban.... HLD- continue crestor  Cardiac Rehab- you are encouraged to enroll and are cleared to participate at this time Driving- okay to resume  as he is no longer taking pain medication Sternal precautions as discussed RTC prn  Jesse Kasal, PA-C [facility] [phone]

## 2023-10-10 NOTE — Telephone Encounter (Signed)
 Patient contacted the office concerned about swelling at the [facility] incision site of his right leg. He is s/p CABG with Dr. Kerrin 12/24. Patient states that this started 30 minutes before the call. It is not painful, soft to touch, and is not red. He did send a picture and [name], [name] is aware. Patient advised that looks like a seroma from the photo. Nothing needed at this time, but we can move his appointment up one week for evaluation. Patient aware and acknowledged receipt.

## 2023-10-14 ENCOUNTER — Ambulatory Visit (HOSPITAL_BASED_OUTPATIENT_CLINIC_OR_DEPARTMENT_OTHER): Payer: Medicare Other | Admitting: Family

## 2023-10-14 ENCOUNTER — Encounter (HOSPITAL_BASED_OUTPATIENT_CLINIC_OR_DEPARTMENT_OTHER): Payer: Self-pay | Admitting: Family

## 2023-10-14 VITALS — BP 110/70 | HR 74 | Ht 70.0 in | Wt 191.0 lb

## 2023-10-14 DIAGNOSIS — R040 Epistaxis: Secondary | ICD-10-CM | POA: Diagnosis not present

## 2023-10-14 DIAGNOSIS — E782 Mixed hyperlipidemia: Secondary | ICD-10-CM | POA: Diagnosis not present

## 2023-10-14 DIAGNOSIS — I4891 Unspecified atrial fibrillation: Secondary | ICD-10-CM

## 2023-10-14 DIAGNOSIS — I25118 Atherosclerotic heart disease of native coronary artery with other forms of angina pectoris: Secondary | ICD-10-CM

## 2023-10-14 DIAGNOSIS — Z5181 Encounter for therapeutic drug level monitoring: Secondary | ICD-10-CM

## 2023-10-14 DIAGNOSIS — I9789 Other postprocedural complications and disorders of the circulatory system, not elsewhere classified: Secondary | ICD-10-CM

## 2023-10-14 MED ORDER — NITROGLYCERIN 0.4 MG SL SUBL: 0.4000 mg | SUBLINGUAL_TABLET | SUBLINGUAL | 3 refills | Status: DC | PRN

## 2023-10-14 NOTE — Patient Instructions (Addendum)
 Medication Instructions:  Your physician recommends that you continue on your current medications as directed. Please refer to the Current Medication list given to you today.  *If you need a refill on your cardiac medications before your next appointment, please call your pharmacy*  Lab Work: FASTING LP/CMET END OF FEBRUARY   If you have labs (blood work) drawn today and your tests are completely normal, you will receive your results only by: MyChart Message (if you have MyChart) OR A paper copy in the mail If you have any lab test that is abnormal or we need to change your treatment, we will call you to review the results.  Testing/Procedures: NONE  Follow-Up: 01/08/2024 11:20 AM WITH DR JEFFRIE   Other Instructions YOU HAVE BEEN REFERRED TO ENT   Ambulatory referral to ENT (Su LELON Moccasin) Where: [facility] Address: [address] [place], [address] [place] [address] Phone: [phone]  IF YOU DO NOT HEAR FROM THEM IN 2 WEEKS YOU CAN CALL THEM DIRECTLY

## 2023-10-14 NOTE — Progress Notes (Signed)
 Cardiology Office Note:  .   Date:  10/16/2023  ID:  Ozell Canary, DOB Mar 08, 1956, MRN [id number] PCP: Nichole Senior, MD  [facility] Providers Cardiologist:  Oneil Parchment, MD    History of Present Illness: Jesse Coleman   Khalifa Knecht is a 68 y.o. 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   Initial cardiac workup 2013 due to chest pain. LHC May 2014 showed a small caliber, non-dominant RCA, diagonal, and obtuse marginal which were not amenable to PCI and was recommended for medical management. Prior intolerance to Amlodipine with LE edema. Echo 06/2019 LVEF 66-60%, moderate biatrial enlargement.   Admission 12/19-12/31/24 with NSTEMI and on 09/23/23 underwent CABG x4 along with endarterectomy of of LAD with vein patch and angioplasty. He did develop blurriness and quadrant vision loss in right eye with MRI no acute intracranial process. 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 CTA no concern in major arteries of head/neck. There was mediastinal hematoma adjacant to LIMA graft. Has post operative atrial fibrillation and put on Amiodarone . Plan to consider outpatient DCCV if did not self-convert. Echo during admission LVEF 50-55% (prio 2014 LVEF 40-45%).   Presents today for follow up with his wife. Notes nosebleeds with Eliquis  and Plavix .Previously required cautery with ENT in [facility], requests local referral. He has used vasoline and humidity from tea kettle without improvement. Unchanged visual deficits since discharge, PCP has referred to ophthalmology. Tolerating increased activity at home well. Small seroma at RLE vein grafting site but no pain nor erythema.   ROS: Please see the history of present illness.    All other systems reviewed and are negative.   Studies Reviewed: Jesse Coleman   EKG  Interpretation Date/Time:  Tuesday October 14 2023 11:12:57 EST Ventricular Rate:  70 PR Interval:  252 QRS Duration:  128 QT Interval:  432 QTC Calculation: 466 R Axis:   81  Text Interpretation: Sinus rhythm with 1st degree A-V block Left bundle branch block TWI V5-V6. Sinus rhythm has replaced Atrial fibrillation Confirmed by Vannie Mora ([id number]) on 10/16/2023 8:56:37 PM    Cardiac Studies & Procedures   CARDIAC CATHETERIZATION  CARDIAC CATHETERIZATION 09/19/2023  Narrative Images from the original result were not included. Left Heart Catheterization 09/19/23: Hemodynamic data: LV 122/0, EDP 10 mmHg.  Ao 108/61, mean 79 mmHg.  No pressure gradient across the aortic valve.  Angiographic data: Severe coronary calcification of all the coronary arteries. LV: Marked global hypokinesis.  LVEF 25 to at most 30%. LM: Calcified but large-caliber vessel.  No luminal obstruction. LAD: Severely calcified throughout.  Proximal LAD has eccentric 70 to 80% stenosis. Small D1 and D2 with high-grade ostial stenosis of 90% and diffuse disease.  Moderate size D3 with ostial 99% stenosis.  LAD CTO after D3 origin.  Distal LAD collateralized by ipsilateral and contralateral collaterals.  Distal LAD appears bypassable. LCx: Dominant.  Moderate disease in the ostium and proximal segment of 40%.  Very tiny OM1 and 2 with severe disease, large OM 3 with ostial 90% stenosis.  Distal CX has mild disease. RI: CTO in the ostium.  Ipsilateral collaterals.  The vessel faintly and appears to be small to moderate caliber vessel however large distribution. RCA: Nondominant with diffuse disease with a proximal 90% focal stenosis.  Gives collaterals to distal LAD.    Impression and recommendations: Severe diffuse calcific three-vessel coronary artery disease.  Severely reduced LVEF.  Will refer for CT surgery for consideration for CABG.  Findings Coronary Findings Diagnostic  Dominance: Left  Left  Main Dist LM to Prox LAD lesion is 80% stenosed.  Left Anterior Descending Collaterals Dist LAD filled by collaterals from RV Branch.  Collaterals Dist LAD filled by collaterals from Acute Mrg.  Collaterals Dist LAD filled by collaterals from Dist RCA.  Mid LAD lesion is 95% stenosed. Mid LAD to Dist LAD lesion is 100% stenosed.  First Diagonal Branch Vessel is small in size. There is severe disease in the vessel.  Second Diagonal Branch There is moderate disease in the vessel.  Third Diagonal Branch 3rd Diag lesion is 95% stenosed.  Ramus Intermedius Collaterals Ramus filled by collaterals from Mid LAD.  Collaterals Ramus filled by collaterals from Mid LAD.  Collaterals Ramus filled by collaterals from 3rd Mrg.  Ramus lesion is 100% stenosed.  Left Circumflex Ost Cx to Mid Cx lesion is 40% stenosed.  First Obtuse Marginal Branch 1st Mrg lesion is 99% stenosed.  Second Obtuse Marginal Branch 2nd Mrg lesion is 99% stenosed.  Third Obtuse Marginal Branch 3rd Mrg lesion is 90% stenosed.  Right Coronary Artery There is moderate diffuse disease throughout the vessel. Prox RCA lesion is 90% stenosed.  Intervention  No interventions have been documented.    ECHOCARDIOGRAM  ECHOCARDIOGRAM COMPLETE 09/20/2023  Narrative ECHOCARDIOGRAM REPORT    Patient Name:   SHELIA MAGALLON Date of Exam: 09/20/2023 Medical Rec #:  [id number]      Height:       70.0 in Accession #:    [id number]     Weight:       197.0 lb Date of Birth:  [birth year]      BSA:          2.074 m Patient Age:    5 years       BP:           141/72 mmHg Patient Gender: M              HR:           80 bpm. Exam Location:  Inpatient  Procedure: 2D Echo, 3D Echo, Cardiac Doppler and Color Doppler  Indications:    Dyspnea R06.00  History:        Patient has prior history of Echocardiogram examinations, most recent 06/10/2019. Cardiomyopathy, Previous Myocardial Infarction and CAD,  Signs/Symptoms:Chest Pain; Risk Factors:Dyslipidemia.  Sonographer:    Thea Norlander RCS Referring Phys: [id number] GORDY BERGAMO  IMPRESSIONS   1. Left ventricular ejection fraction, by estimation, is 50 to 55%. Left ventricular ejection fraction by 3D volume is 52 %. The left ventricle has low normal function. The left ventricle demonstrates regional wall motion abnormalities (see scoring diagram/findings for description). Left ventricular diastolic parameters were normal. apical hypokinesis without thrombus. 2. Right ventricular systolic function is normal. The right ventricular size is normal. 3. The mitral valve is grossly normal. Mild mitral valve regurgitation. No evidence of mitral stenosis. 4. The aortic valve is tricuspid. Aortic valve regurgitation is trivial. No aortic stenosis is present.  Comparison(s): Prior images reviewed side by side. New apical wall motion abnormalities possible, apex not as well evaluated in 2020 study.  FINDINGS Left Ventricle: Left ventricular ejection fraction, by estimation, is 50 to 55%. Left ventricular ejection fraction by 3D volume is 52 %. The left ventricle has low normal function. The left ventricle demonstrates regional wall motion abnormalities. The left ventricular internal cavity size was normal  in size. Suboptimal image quality limits for assessment of left ventricular hypertrophy. Left ventricular diastolic parameters were normal.   LV Wall Scoring: The apical lateral segment, apical septal segment, apical anterior segment, and apical inferior segment are hypokinetic. The anterior wall, antero-lateral wall, anterior septum, inferior wall, posterior wall, mid inferoseptal segment, and basal inferoseptal segment are normal. Apical hypokinesis without thrombus.  Right Ventricle: The right ventricular size is normal. No increase in right ventricular wall thickness. Right ventricular systolic function is normal.  Left Atrium: Left atrial size  was normal in size.  Right Atrium: Right atrial size was normal in size.  Pericardium: There is no evidence of pericardial effusion.  Mitral Valve: The mitral valve is grossly normal. Mild mitral valve regurgitation. No evidence of mitral valve stenosis.  Tricuspid Valve: The tricuspid valve is normal in structure. Tricuspid valve regurgitation is trivial. No evidence of tricuspid stenosis.  Aortic Valve: The aortic valve is tricuspid. Aortic valve regurgitation is trivial. No aortic stenosis is present. Aortic valve peak gradient measures 6.0 mmHg.  Pulmonic Valve: The pulmonic valve was not well visualized. Pulmonic valve regurgitation is not visualized. No evidence of pulmonic stenosis.  Aorta: The aortic root is normal in size and structure.  IAS/Shunts: The atrial septum is grossly normal.   LEFT VENTRICLE PLAX 2D LVIDd:         4.80 cm         Diastology LVIDs:         3.50 cm         LV e' medial:    7.51 cm/s LV PW:         1.10 cm         LV E/e' medial:  8.7 LV IVS:        1.40 cm         LV e' lateral:   9.03 cm/s LVOT diam:     2.00 cm         LV E/e' lateral: 7.3 LV SV:         52 LV SV Index:   25 LVOT Area:     3.14 cm        3D Volume EF LV 3D EF:    Left ventricul ar ejection fraction by 3D volume is 52 %.  3D Volume EF: 3D EF:        52 % LV EDV:       184 ml LV ESV:       88 ml LV SV:        96 ml  RIGHT VENTRICLE RV S prime:     10.80 cm/s TAPSE (M-mode): 1.5 cm  LEFT ATRIUM             Index        RIGHT ATRIUM           Index LA diam:        4.80 cm 2.31 cm/m   RA Area:     11.40 cm LA Vol (A2C):   51.6 ml 24.88 ml/m  RA Volume:   18.20 ml  8.78 ml/m LA Vol (A4C):   49.7 ml 23.96 ml/m LA Biplane Vol: 52.9 ml 25.51 ml/m AORTIC VALVE AV Area (Vmax): 2.58 cm AV Vmax:        122.00 cm/s AV Peak Grad:   6.0 mmHg LVOT Vmax:      100.23 cm/s LVOT Vmean:     68.733 cm/s LVOT VTI:  0.165 m  AORTA Ao Root diam: 3.40 cm  MITRAL  VALVE MV Area (PHT): 6.07 cm    SHUNTS MV Decel Time: 125 msec    Systemic VTI:  0.17 m MR Peak grad: 67.9 mmHg    Systemic Diam: 2.00 cm MR Vmax:      412.00 cm/s MV E velocity: 65.50 cm/s  Stanly Leavens MD Electronically signed by Stanly Leavens MD Signature Date/Time: 09/20/2023/5:17:41 PM    Final             Risk Assessment/Calculations:    CHA2DS2-VASc Score = 3   This indicates a 3.2% annual risk of stroke. The patient's score is based upon: CHF History: 0 HTN History: 1 Diabetes History: 0 Stroke History: 0 Vascular Disease History: 1 Age Score: 1 Gender Score: 0           Physical Exam:   VS:  BP 110/70 (BP Location: Left Arm, Patient Position: Sitting, Cuff Size: Normal)   Pulse 74   Ht 5' 10 (1.778 m)   Wt 191 lb (86.6 kg)   SpO2 97%   BMI 27.41 kg/m    Wt Readings from Last 3 Encounters:  10/15/23 190 lb (86.2 kg)  10/14/23 191 lb (86.6 kg)  10/02/23 190 lb (86.2 kg)    GEN: Well nourished, well developed in no acute distress NECK: No JVD; No carotid bruits CARDIAC: RRR, no murmurs, rubs, gallops. Midsternal incision with approximated edges. RESPIRATORY:  Clear to auscultation without rales, wheezing or rhonchi  ABDOMEN: Soft, non-tender, non-distended EXTREMITIES:  No edema; No deformity. RLE vein grafting site with 1 in diameter seroma at distal portion of incision with no erythema. Incision edges remain well approximated.   ASSESSMENT AND PLAN: .    CAD s/p NSTEMI and CABG x4 09/23/23 - Incisions healing appropriately. Small seroma at RLE vein grafting site not of concern. He will contact us  if it increases in size or causes pain. GDMT Plavix , Metoprolol , Rosuvastatin . Given presentation with NSTEMI, recommend Plavix  for at least 6 mos, ideally 12 mos. May only tolerate 6 mos due to nose bleeds. Encouraged to participate in cardiac rehab. Permissible to start cardiac rehab when cleared by TCTS.   Post operative atrial fibrillation  / On Amiodarone  therapy - EKG today shows he has converted to NSR. Consider discontinuation of Amiodarone  at follow up in 3 mos. Will need to continue OAC due to hx of unprovoked PE.   History of unprovoked PE / Hypercoagualble state - Continue OAC. Due to recurrent epistaxis, refer to ENT for consideration of cautery.   HLD, LDL goal <55 - Elevated Lp(a) consistent with familial hyperlipidemia. Multiple prior intolerances. Tolerating Rosuvastatin . Update FLP/CMP in 6 weeks. Initiated discussion regarding PCSK9i or Leqvio, uninterested in injections. Previously given sample of Nexletol but did not trial, if LDL not at goal on follow up labs consider trial of Nexletol. Zetia  previously stopped for unclear reasons.  HTN - Relative hypotension in clinic. Encouraged home BP monitoring. Discussed if SBP <100 to eat and drink something to help naturally raise BP.         Dispo: follow up in 3 mos  Signed, Reche GORMAN Finder, NP

## 2023-10-15 ENCOUNTER — Ambulatory Visit (INDEPENDENT_AMBULATORY_CARE_PROVIDER_SITE_OTHER): Payer: Medicare Other | Admitting: Physician Assistant

## 2023-10-15 ENCOUNTER — Ambulatory Visit
Admission: RE | Admit: 2023-10-15 | Discharge: 2023-10-15 | Disposition: A | Discharge status: Home / Self Care | Payer: Medicare Other | Source: Ambulatory Visit | Attending: Thoracic Surgery (Cardiothoracic Vascular Surgery) | Admitting: Thoracic Surgery (Cardiothoracic Vascular Surgery)

## 2023-10-15 ENCOUNTER — Other Ambulatory Visit: Payer: Self-pay | Admitting: Thoracic Surgery (Cardiothoracic Vascular Surgery)

## 2023-10-15 VITALS — BP 133/78 | HR 75 | Resp 18 | Ht 70.0 in | Wt 190.0 lb

## 2023-10-15 DIAGNOSIS — Z951 Presence of aortocoronary bypass graft: Secondary | ICD-10-CM

## 2023-10-16 ENCOUNTER — Encounter (HOSPITAL_COMMUNITY): Payer: Self-pay

## 2023-10-21 ENCOUNTER — Ambulatory Visit: Payer: Medicare Other

## 2023-10-23 ENCOUNTER — Encounter (HOSPITAL_COMMUNITY)
Admission: RE | Admit: 2023-10-23 | Discharge: 2023-10-23 | Disposition: A | Discharge status: Home / Self Care | Payer: Medicare Other | Source: Ambulatory Visit | Attending: Cardiology | Admitting: Cardiology

## 2023-10-23 DIAGNOSIS — Z951 Presence of aortocoronary bypass graft: Secondary | ICD-10-CM | POA: Insufficient documentation

## 2023-10-29 ENCOUNTER — Encounter (HOSPITAL_COMMUNITY)
Admission: RE | Admit: 2023-10-29 | Discharge: 2023-10-29 | Disposition: A | Discharge status: Home / Self Care | Payer: Medicare Other | Source: Ambulatory Visit | Attending: Cardiology

## 2023-10-29 VITALS — Ht 70.0 in | Wt 184.8 lb

## 2023-10-29 DIAGNOSIS — Z951 Presence of aortocoronary bypass graft: Secondary | ICD-10-CM | POA: Diagnosis present

## 2023-10-29 NOTE — Patient Instructions (Signed)
Patient Instructions  Patient Details  Name: Jesse Coleman MRN: [id number] Date of Birth: 03/26/1956 Referring Provider:  Loreli Slot, *  Below are your personal goals for exercise, nutrition, and risk factors. Our goal is to help you stay on track towards obtaining and maintaining these goals. We will be discussing your progress on these goals with you throughout the program.  Initial Exercise Prescription:  Initial Exercise Prescription - 10/29/23 1400       Date of Initial Exercise RX and Referring Provider   Date 10/29/23    Referring Provider Henderickson, Viviann Spare MD   Primary Cardiologist: Dr. Donato Schultz     Oxygen   Maintain Oxygen Saturation 88% or higher      Treadmill   MPH 2    Grade 1    Minutes 15    METs 2.81      REL-XR   Level 3    Speed 50    Minutes 15    METs 2.8      Prescription Details   Frequency (times per week) 3    Duration Progress to 30 minutes of continuous aerobic without signs/symptoms of physical distress      Intensity   THRR 40-80% of Max Heartrate 108-137    Ratings of Perceived Exertion 11-13    Perceived Dyspnea 0-4      Progression   Progression Continue to progress workloads to maintain intensity without signs/symptoms of physical distress.      Resistance Training   Training Prescription Yes    Weight 5 lb    Reps 10-15             Exercise Goals: Frequency: Be able to perform aerobic exercise two to three times per week in program working toward 2-5 days per week of home exercise.  Intensity: Work with a perceived exertion of 11 (fairly light) - 15 (hard) while following your exercise prescription.  We will make changes to your prescription with you as you progress through the program.   Duration: Be able to do 30 to 45 minutes of continuous aerobic exercise in addition to a 5 minute warm-up and a 5 minute cool-down routine.   Nutrition Goals: Your personal nutrition goals will be established when you do  your nutrition analysis with the dietician.  The following are general nutrition guidelines to follow: Cholesterol < 200mg /day Sodium < 1500mg /day Fiber: Men over 50 yrs - 30 grams per day  Personal Goals:  Personal Goals and Risk Factors at Admission - 10/29/23 1414       Core Components/Risk Factors/Patient Goals on Admission    Weight Management Yes;Weight Maintenance    Intervention Weight Management: Develop a combined nutrition and exercise program designed to reach desired caloric intake, while maintaining appropriate intake of nutrient and fiber, sodium and fats, and appropriate energy expenditure required for the weight goal.;Weight Management: Provide education and appropriate resources to help participant work on and attain dietary goals.    Admit Weight 184 lb 12.8 oz (83.8 kg)    Goal Weight: Short Term 184 lb (83.5 kg)    Goal Weight: Long Term 184 lb (83.5 kg)    Expected Outcomes Short Term: Continue to assess and modify interventions until short term weight is achieved;Long Term: Adherence to nutrition and physical activity/exercise program aimed toward attainment of established weight goal;Weight Maintenance: Understanding of the daily nutrition guidelines, which includes 25-35% calories from fat, 7% or less cal from saturated fats, less than 200mg  cholesterol, less than 1.5gm  of sodium, & 5 or more servings of fruits and vegetables daily    Hypertension Yes    Intervention Provide education on lifestyle modifcations including regular physical activity/exercise, weight management, moderate sodium restriction and increased consumption of fresh fruit, vegetables, and low fat dairy, alcohol moderation, and smoking cessation.;Monitor prescription use compliance.    Expected Outcomes Short Term: Continued assessment and intervention until BP is < 140/79mm HG in hypertensive participants. < 130/68mm HG in hypertensive participants with diabetes, heart failure or chronic kidney  disease.;Long Term: Maintenance of blood pressure at goal levels.    Lipids Yes    Intervention Provide education and support for participant on nutrition & aerobic/resistive exercise along with prescribed medications to achieve LDL 70mg , HDL >40mg .    Expected Outcomes Short Term: Participant states understanding of desired cholesterol values and is compliant with medications prescribed. Participant is following exercise prescription and nutrition guidelines.;Long Term: Cholesterol controlled with medications as prescribed, with individualized exercise RX and with personalized nutrition plan. Value goals: LDL < 70mg , HDL > 40 mg.             Tobacco Use Initial Evaluation: Social History   Tobacco Use  Smoking Status Never  Smokeless Tobacco Never    Exercise Goals and Review:  Exercise Goals     Row Name 10/29/23 1413             Exercise Goals   Increase Physical Activity Yes       Intervention Provide advice, education, support and counseling about physical activity/exercise needs.;Develop an individualized exercise prescription for aerobic and resistive training based on initial evaluation findings, risk stratification, comorbidities and participant's personal goals.       Expected Outcomes Short Term: Attend rehab on a regular basis to increase amount of physical activity.;Long Term: Add in home exercise to make exercise part of routine and to increase amount of physical activity.;Long Term: Exercising regularly at least 3-5 days a week.       Increase Strength and Stamina Yes       Intervention Provide advice, education, support and counseling about physical activity/exercise needs.;Develop an individualized exercise prescription for aerobic and resistive training based on initial evaluation findings, risk stratification, comorbidities and participant's personal goals.       Expected Outcomes Short Term: Perform resistance training exercises routinely during rehab and add in  resistance training at home;Short Term: Increase workloads from initial exercise prescription for resistance, speed, and METs.;Long Term: Improve cardiorespiratory fitness, muscular endurance and strength as measured by increased METs and functional capacity ( )       Able to understand and use rate of perceived exertion (RPE) scale Yes       Intervention Provide education and explanation on how to use RPE scale       Expected Outcomes Long Term:  Able to use RPE to guide intensity level when exercising independently;Short Term: Able to use RPE daily in rehab to express subjective intensity level       Able to understand and use Dyspnea scale Yes       Intervention Provide education and explanation on how to use Dyspnea scale       Expected Outcomes Long Term: Able to use Dyspnea scale to guide intensity level when exercising independently;Short Term: Able to use Dyspnea scale daily in rehab to express subjective sense of shortness of breath during exertion       Knowledge and understanding of Target Heart Rate Range (THRR) Yes  Intervention Provide education and explanation of THRR including how the numbers were predicted and where they are located for reference       Expected Outcomes Short Term: Able to state/look up THRR;Long Term: Able to use THRR to govern intensity when exercising independently;Short Term: Able to use daily as guideline for intensity in rehab       Able to check pulse independently Yes       Intervention Review the importance of being able to check your own pulse for safety during independent exercise;Provide education and demonstration on how to check pulse in carotid and radial arteries.       Expected Outcomes Short Term: Able to explain why pulse checking is important during independent exercise;Long Term: Able to check pulse independently and accurately       Understanding of Exercise Prescription Yes       Intervention Provide education, explanation, and written  materials on patient's individual exercise prescription       Expected Outcomes Short Term: Able to explain program exercise prescription;Long Term: Able to explain home exercise prescription to exercise independently              Copy of goals given to participant.

## 2023-10-29 NOTE — Progress Notes (Signed)
Cardiac Individual Treatment Plan  Patient Details  Name: Jesse Coleman MRN: [id number] Date of Birth: 1956-08-07 Referring Provider:   Flowsheet Row CARDIAC REHAB PHASE II ORIENTATION from 10/29/2023 in [facility] CARDIAC REHABILITATION  Referring Provider Henderickson, Viviann Spare MD  [Primary Cardiologist: Dr. Loraine Leriche Skains]       Initial Encounter Date:  Flowsheet Row CARDIAC REHAB PHASE II ORIENTATION from 10/29/2023 in [place] CARDIAC REHABILITATION  Date 10/29/23       Visit Diagnosis: S/P CABG x 4  Patient's Home Medications on Admission:  Current Outpatient Medications:    alfuzosin (UROXATRAL) 10 MG 24 hr tablet, Take 10 mg by mouth daily., Disp: , Rfl:    amiodarone (PACERONE) 200 MG tablet, Take 400 mg two times daily for 3 days;then take 200 mg two times daily for 7 days;then take 200 mg daily thereafater (Patient taking differently: Take 200 mg by mouth daily. Take 400 mg two times daily for 3 days;then take 200 mg two times daily for 7 days;then take 200 mg daily thereafater), Disp: 60 tablet, Rfl: 1   bromocriptine (PARLODEL) 2.5 MG tablet, Take 1.25 mg by mouth at bedtime. , Disp: , Rfl:    clopidogrel (PLAVIX) 75 MG tablet, Take 1 tablet (75 mg total) by mouth daily., Disp: 30 tablet, Rfl: 1   DULoxetine (CYMBALTA) 30 MG capsule, Take 30 mg by mouth daily., Disp: , Rfl:    ELIQUIS 5 MG TABS tablet, TAKE 1 TABLET BY MOUTH TWICE A DAY. (Patient taking differently: Take 5 mg by mouth 2 (two) times daily.), Disp: 60 tablet, Rfl: 0   EPINEPHrine 0.3 mg/0.3 mL IJ SOAJ injection, Inject 0.3 mLs (0.3 mg total) into the muscle as needed for anaphylaxis., Disp: 1 each, Rfl: 0   LORazepam (ATIVAN) 1 MG tablet, Take 1 mg by mouth every 8 (eight) hours as needed for anxiety., Disp: , Rfl:    metoprolol tartrate (LOPRESSOR) 25 MG tablet, Take 0.5 tablets (12.5 mg total) by mouth 2 (two) times daily., Disp: 30 tablet, Rfl: 1   nitroGLYCERIN (NITROSTAT) 0.4 MG SL tablet, Place 1 tablet  (0.4 mg total) under the tongue every 5 (five) minutes as needed for chest pain., Disp: 25 tablet, Rfl: 3   rosuvastatin (CRESTOR) 40 MG tablet, Take 1 tablet (40 mg total) by mouth every evening., Disp: 30 tablet, Rfl: 1   sildenafil (VIAGRA) 100 MG tablet, TAKE 1 TABLET BY MOUTH 30CMINUTES PRIOR TO 1HR BEFORE RELATIONS., Disp: , Rfl:    traMADol (ULTRAM) 50 MG tablet, Take 1 tablet (50 mg total) by mouth every 6 (six) hours as needed for severe pain (pain score 7-10)., Disp: 30 tablet, Rfl: 0   vitamin B-12 (CYANOCOBALAMIN) 100 MCG tablet, Take 100 mcg by mouth 3 (three) times a week., Disp: , Rfl:   Past Medical History: Past Medical History:  Diagnosis Date   CAD (coronary artery disease)    Erectile dysfunction    Hematospermia    Hyperlipidemia    Hypertension 2014   Overweight(278.02)    Prolactin-secreting pituitary adenoma (HCC) 1999   Medical therapy-1999    Tobacco Use: Social History   Tobacco Use  Smoking Status Never  Smokeless Tobacco Never    Labs: Review Flowsheet  More data may exist      Latest Ref Rng & Units 04/23/2019 09/19/2023 09/20/2023 09/22/2023 09/23/2023  Labs for ITP Cardiac and Pulmonary Rehab  Cholestrol 0 - 200 mg/dL - - 295  - -  LDL (calc) 0 - 99 mg/dL - -  80  - -  HDL-C >40 mg/dL - - 89  - -  Trlycerides <150 mg/dL - - 35  - -  Hemoglobin A1c 4.8 - 5.6 % 5.4  5.2  - 5.1  -  PH, Arterial 7.35 - 7.45 - - - 7.4  7.315  7.306  7.281  7.253  7.373  7.366  7.272  7.330   PCO2 arterial 32 - 48 mmHg - - - 35  37.2  40.8  44.5  48.2  37.8  35.5  50.8  44.9   Bicarbonate 20.0 - 28.0 mmol/L 16.9  - - 22.3  18.9  20.6  21.4  21.3  22.0  20.3  14.2  23.4  23.7   TCO2 22 - 32 mmol/L 18  - - - 20  22  23  23  22  23  23  23  21  23  15  25  19  25  26    Acid-base deficit 0.0 - 2.0 mmol/L 7.0  - - 1.9  7.0  6.0  6.0  6.0  3.0  5.0  12.0  4.0  2.0   O2 Saturation % 87.0  - - 99.2  93  95  93  97  100  100  85  100  100     Details       Multiple  values from one day are sorted in reverse-chronological order         Capillary Blood Glucose: Lab Results  Component Value Date   GLUCAP 112 (H) 09/25/2023   GLUCAP 132 (H) 09/25/2023   GLUCAP 111 (H) 09/25/2023   GLUCAP 131 (H) 09/24/2023   GLUCAP 138 (H) 09/24/2023     Exercise Target Goals: Exercise Program Goal: Individual exercise prescription set using results from initial 6 min walk test and THRR while considering  patient's activity barriers and safety.   Exercise Prescription Goal: Starting with aerobic activity 30 plus minutes a day, 3 days per week for initial exercise prescription. Provide home exercise prescription and guidelines that participant acknowledges understanding prior to discharge.  Activity Barriers & Risk Stratification:  Activity Barriers & Cardiac Risk Stratification - 10/29/23 1411       Activity Barriers & Cardiac Risk Stratification   Activity Barriers None    Cardiac Risk Stratification High             6 Minute Walk:  6 Minute Walk     Row Name 10/29/23 1408         6 Minute Walk   Phase Initial     Distance 1010 feet     Walk Time 6 minutes     # of Rest Breaks 0     MPH 1.91     METS 2.61     RPE 7     VO2 Peak 9.13     Symptoms No     Resting HR 79 bpm     Resting BP 126/74     Resting Oxygen Saturation  98 %     Exercise Oxygen Saturation  during 6 min walk 98 %     Max Ex. HR 94 bpm     Max Ex. BP 146/64     2 Minute Post BP 116/64              Oxygen Initial Assessment:   Oxygen Re-Evaluation:   Oxygen Discharge (Final Oxygen Re-Evaluation):   Initial Exercise Prescription:  Initial Exercise Prescription - 10/29/23 1400  Date of Initial Exercise RX and Referring Provider   Date 10/29/23    Referring Provider Henderickson, Viviann Spare MD   Primary Cardiologist: Dr. Donato Schultz     Oxygen   Maintain Oxygen Saturation 88% or higher      Treadmill   MPH 2    Grade 1    Minutes 15    METs  2.81      REL-XR   Level 3    Speed 50    Minutes 15    METs 2.8      Prescription Details   Frequency (times per week) 3    Duration Progress to 30 minutes of continuous aerobic without signs/symptoms of physical distress      Intensity   THRR 40-80% of Max Heartrate 108-137    Ratings of Perceived Exertion 11-13    Perceived Dyspnea 0-4      Progression   Progression Continue to progress workloads to maintain intensity without signs/symptoms of physical distress.      Resistance Training   Training Prescription Yes    Weight 5 lb    Reps 10-15             Perform Capillary Blood Glucose checks as needed.  Exercise Prescription Changes:   Exercise Prescription Changes     Row Name 10/29/23 1400             Response to Exercise   Blood Pressure (Admit) 126/74       Blood Pressure (Exercise) 146/64       Blood Pressure (Exit) 116/64       Heart Rate (Admit) 79 bpm       Heart Rate (Exercise) 94 bpm       Heart Rate (Exit) 80 bpm       Oxygen Saturation (Admit) 98 %       Oxygen Saturation (Exercise) 98 %       Rating of Perceived Exertion (Exercise) 7       Symptoms none       Comments walk test results                Exercise Comments:   Exercise Goals and Review:   Exercise Goals     Row Name 10/29/23 1413             Exercise Goals   Increase Physical Activity Yes       Intervention Provide advice, education, support and counseling about physical activity/exercise needs.;Develop an individualized exercise prescription for aerobic and resistive training based on initial evaluation findings, risk stratification, comorbidities and participant's personal goals.       Expected Outcomes Short Term: Attend rehab on a regular basis to increase amount of physical activity.;Long Term: Add in home exercise to make exercise part of routine and to increase amount of physical activity.;Long Term: Exercising regularly at least 3-5 days a week.        Increase Strength and Stamina Yes       Intervention Provide advice, education, support and counseling about physical activity/exercise needs.;Develop an individualized exercise prescription for aerobic and resistive training based on initial evaluation findings, risk stratification, comorbidities and participant's personal goals.       Expected Outcomes Short Term: Perform resistance training exercises routinely during rehab and add in resistance training at home;Short Term: Increase workloads from initial exercise prescription for resistance, speed, and METs.;Long Term: Improve cardiorespiratory fitness, muscular endurance and strength as measured by increased METs and functional capacity ( )  Able to understand and use rate of perceived exertion (RPE) scale Yes       Intervention Provide education and explanation on how to use RPE scale       Expected Outcomes Long Term:  Able to use RPE to guide intensity level when exercising independently;Short Term: Able to use RPE daily in rehab to express subjective intensity level       Able to understand and use Dyspnea scale Yes       Intervention Provide education and explanation on how to use Dyspnea scale       Expected Outcomes Long Term: Able to use Dyspnea scale to guide intensity level when exercising independently;Short Term: Able to use Dyspnea scale daily in rehab to express subjective sense of shortness of breath during exertion       Knowledge and understanding of Target Heart Rate Range (THRR) Yes       Intervention Provide education and explanation of THRR including how the numbers were predicted and where they are located for reference       Expected Outcomes Short Term: Able to state/look up THRR;Long Term: Able to use THRR to govern intensity when exercising independently;Short Term: Able to use daily as guideline for intensity in rehab       Able to check pulse independently Yes       Intervention Review the importance of being able  to check your own pulse for safety during independent exercise;Provide education and demonstration on how to check pulse in carotid and radial arteries.       Expected Outcomes Short Term: Able to explain why pulse checking is important during independent exercise;Long Term: Able to check pulse independently and accurately       Understanding of Exercise Prescription Yes       Intervention Provide education, explanation, and written materials on patient's individual exercise prescription       Expected Outcomes Short Term: Able to explain program exercise prescription;Long Term: Able to explain home exercise prescription to exercise independently                Exercise Goals Re-Evaluation :    Discharge Exercise Prescription (Final Exercise Prescription Changes):  Exercise Prescription Changes - 10/29/23 1400       Response to Exercise   Blood Pressure (Admit) 126/74    Blood Pressure (Exercise) 146/64    Blood Pressure (Exit) 116/64    Heart Rate (Admit) 79 bpm    Heart Rate (Exercise) 94 bpm    Heart Rate (Exit) 80 bpm    Oxygen Saturation (Admit) 98 %    Oxygen Saturation (Exercise) 98 %    Rating of Perceived Exertion (Exercise) 7    Symptoms none    Comments walk test results             Nutrition:  Target Goals: Understanding of nutrition guidelines, daily intake of sodium 1500mg , cholesterol 200mg , calories 30% from fat and 7% or less from saturated fats, daily to have 5 or more servings of fruits and vegetables.  Biometrics:  Pre Biometrics - 10/29/23 1413       Pre Biometrics   Height 5\' 10"  (1.778 m)    Weight 83.8 kg    Waist Circumference 41 inches    Hip Circumference 37 inches    Waist to Hip Ratio 1.11 %    BMI (Calculated) 26.52    Grip Strength 40.1 kg    Single Leg Stand 18.1 seconds  Nutrition Therapy Plan and Nutrition Goals:  Nutrition Therapy & Goals - 10/29/23 1414       Intervention Plan   Intervention  Prescribe, educate and counsel regarding individualized specific dietary modifications aiming towards targeted core components such as weight, hypertension, lipid management, diabetes, heart failure and other comorbidities.;Nutrition handout(s) given to patient.    Expected Outcomes Short Term Goal: Understand basic principles of dietary content, such as calories, fat, sodium, cholesterol and nutrients.;Long Term Goal: Adherence to prescribed nutrition plan.             Nutrition Assessments:  MEDIFICTS Score Key: >=70 Need to make dietary changes  40-70 Heart Healthy Diet <= 40 Therapeutic Level Cholesterol Diet  Flowsheet Row CARDIAC VIRTUAL BASED CARE from 10/23/2023 in [facility] CARDIAC REHABILITATION  Picture Your Plate Total Score on Admission 62      Picture Your Plate Scores: <16 Unhealthy dietary pattern with much room for improvement. 41-50 Dietary pattern unlikely to meet recommendations for good health and room for improvement. 51-60 More healthful dietary pattern, with some room for improvement.  >60 Healthy dietary pattern, although there may be some specific behaviors that could be improved.    Nutrition Goals Re-Evaluation:   Nutrition Goals Discharge (Final Nutrition Goals Re-Evaluation):   Psychosocial: Target Goals: Acknowledge presence or absence of significant depression and/or stress, maximize coping skills, provide positive support system. Participant is able to verbalize types and ability to use techniques and skills needed for reducing stress and depression.  Initial Review & Psychosocial Screening:  Initial Psych Review & Screening - 10/23/23 1522       Initial Review   Current issues with Current Psychotropic Meds;Current Anxiety/Panic;Current Sleep Concerns;Current Stress Concerns    Source of Stress Concerns Family      Family Dynamics   Good Support System? Yes      Barriers   Psychosocial barriers to participate in program The patient  should benefit from training in stress management and relaxation.      Screening Interventions   Interventions Encouraged to exercise;To provide support and resources with identified psychosocial needs;Provide feedback about the scores to participant    Expected Outcomes Short Term goal: Utilizing psychosocial counselor, staff and physician to assist with identification of specific Stressors or current issues interfering with healing process. Setting desired goal for each stressor or current issue identified.;Long Term Goal: Stressors or current issues are controlled or eliminated.;Short Term goal: Identification and review with participant of any Quality of Life or Depression concerns found by scoring the questionnaire.;Long Term goal: The participant improves quality of Life and PHQ9 Scores as seen by post scores and/or verbalization of changes             Quality of Life Scores:  Quality of Life - 10/23/23 1443       Quality of Life   Select Quality of Life      Quality of Life Scores   Health/Function Pre 17.57 %    Socioeconomic Pre 16.31 %    Psych/Spiritual Pre 14.86 %    Family Pre 21.6 %    GLOBAL Pre 17.31 %            Scores of 19 and below usually indicate a poorer quality of life in these areas.  A difference of  2-3 points is a clinically meaningful difference.  A difference of 2-3 points in the total score of the Quality of Life Index has been associated with significant improvement in overall quality of life,  self-image, physical symptoms, and general health in studies assessing change in quality of life.  PHQ-9: Review Flowsheet       10/29/2023 08/08/2020  Depression screen PHQ 2/9  Decreased Interest 0 0  Down, Depressed, Hopeless 0 0  PHQ - 2 Score 0 0  Altered sleeping 2 -  Tired, decreased energy 1 -  Change in appetite 2 -  Feeling bad or failure about yourself  2 -  Trouble concentrating 0 -  Moving slowly or fidgety/restless 0 -  Suicidal  thoughts 0 -  PHQ-9 Score 7 -  Difficult doing work/chores Not difficult at all -   Interpretation of Total Score  Total Score Depression Severity:  1-4 = Minimal depression, 5-9 = Mild depression, 10-14 = Moderate depression, 15-19 = Moderately severe depression, 20-27 = Severe depression   Psychosocial Evaluation and Intervention:  Psychosocial Evaluation - 10/23/23 1523       Psychosocial Evaluation & Interventions   Interventions Stress management education;Relaxation education;Encouraged to exercise with the program and follow exercise prescription    Comments Patient was referred to CR with CABGx4. He denies any depressions but says since his surgery he does have anxiety about his heath and the future. He also says he has trouble sleeping since his surgery due to the anxiety and surgical site discomfort. He is taking Lorazepam for sleep which helps some. He was started on Cymbalta a few months ago to help manage the stress of his wife and son's cancer diagnosis. He lives with his wife and adult son who are his support people. 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 Both his wife and son were diagnosed with kideny cancer a few months ago and this and his his own health has been a stressor in his life. He has returned to work some. He was exercising some prior to his surgery and enjoys riding his motorcycle. His goals for the program are to keep his weight stable. He and his wife changed their diet after she was diagnosed with cancer and he has lost 30 lbs and he wants to keep this weight off. He also wants to build his energy back up and improve his muscle tone. He is looking forward to pariticpating in the program. No barriers identified for him to complete the program.    Expected Outcomes Short Term: Start the program and attend consistently. Long Term: Complete the program meeting personal goals.    Continue Psychosocial Services  Follow up required  by staff             Psychosocial Re-Evaluation:   Psychosocial Discharge (Final Psychosocial Re-Evaluation):   Vocational Rehabilitation: Provide vocational rehab assistance to qualifying candidates.   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             Education: Education Goals: Education classes will be provided on a weekly basis, covering required topics. Participant will state understanding/return demonstration of topics presented.  Learning Barriers/Preferences:  Learning Barriers/Preferences - 10/23/23 1532       Learning Barriers/Preferences   Learning Barriers None    Learning Preferences Skilled Demonstration  Education Topics: Hypertension, Hypertension Reduction -Define heart disease and high blood pressure. Discus how high blood pressure affects the body and ways to reduce high blood pressure.   Exercise and Your Heart -Discuss why it is important to exercise, the FITT principles of exercise, normal and abnormal responses to exercise, and how to exercise safely.   Angina -Discuss definition of angina, causes of angina, treatment of angina, and how to decrease risk of having angina.   Cardiac Medications -Review what the following cardiac medications are used for, how they affect the body, and side effects that may occur when taking the medications.  Medications include Aspirin, Beta blockers, calcium channel blockers, ACE Inhibitors, angiotensin receptor blockers, diuretics, digoxin, and antihyperlipidemics.   Congestive Heart Failure -Discuss the definition of CHF, how to live with CHF, the signs and symptoms of CHF, and how keep track of weight and sodium  intake.   Heart Disease and Intimacy -Discus the effect sexual activity has on the heart, how changes occur during intimacy as we age, and safety during sexual activity.   Smoking Cessation / COPD -Discuss different methods to quit smoking, the health benefits of quitting smoking, and the definition of COPD.   Nutrition I: Fats -Discuss the types of cholesterol, what cholesterol does to the heart, and how cholesterol levels can be controlled.   Nutrition II: Labels -Discuss the different components of food labels and how to read food label   Heart Parts/Heart Disease and PAD -Discuss the anatomy of the heart, the pathway of blood circulation through the heart, and these are affected by heart disease.   Stress I: Signs and Symptoms -Discuss the causes of stress, how stress may lead to anxiety and depression, and ways to limit stress.   Stress II: Relaxation -Discuss different types of relaxation techniques to limit stress.   Warning Signs of Stroke / TIA -Discuss definition of a stroke, what the signs and symptoms are of a stroke, and how to identify when someone is having stroke.   Knowledge Questionnaire Score:  Knowledge Questionnaire Score - 10/23/23 1442       Knowledge Questionnaire Score   Pre Score 22/26             Core Components/Risk Factors/Patient Goals at Admission:  Personal Goals and Risk Factors at Admission - 10/29/23 1414       Core Components/Risk Factors/Patient Goals on Admission    Weight Management Yes;Weight Maintenance    Intervention Weight Management: Develop a combined nutrition and exercise program designed to reach desired caloric intake, while maintaining appropriate intake of nutrient and fiber, sodium and fats, and appropriate energy expenditure required for the weight goal.;Weight Management: Provide education and appropriate resources to help participant work on and attain dietary goals.    Admit Weight 184 lb 12.8 oz (83.8 kg)     Goal Weight: Short Term 184 lb (83.5 kg)    Goal Weight: Long Term 184 lb (83.5 kg)    Expected Outcomes Short Term: Continue to assess and modify interventions until short term weight is achieved;Long Term: Adherence to nutrition and physical activity/exercise program aimed toward attainment of established weight goal;Weight Maintenance: Understanding of the daily nutrition guidelines, which includes 25-35% calories from fat, 7% or less cal from saturated fats, less than 200mg  cholesterol, less than 1.5gm of sodium, & 5 or more servings of fruits and vegetables daily    Hypertension Yes    Intervention Provide education on lifestyle modifcations including regular physical activity/exercise, weight management, moderate  sodium restriction and increased consumption of fresh fruit, vegetables, and low fat dairy, alcohol moderation, and smoking cessation.;Monitor prescription use compliance.    Expected Outcomes Short Term: Continued assessment and intervention until BP is < 140/55mm HG in hypertensive participants. < 130/74mm HG in hypertensive participants with diabetes, heart failure or chronic kidney disease.;Long Term: Maintenance of blood pressure at goal levels.    Lipids Yes    Intervention Provide education and support for participant on nutrition & aerobic/resistive exercise along with prescribed medications to achieve LDL 70mg , HDL >40mg .    Expected Outcomes Short Term: Participant states understanding of desired cholesterol values and is compliant with medications prescribed. Participant is following exercise prescription and nutrition guidelines.;Long Term: Cholesterol controlled with medications as prescribed, with individualized exercise RX and with personalized nutrition plan. Value goals: LDL < 70mg , HDL > 40 mg.             Core Components/Risk Factors/Patient Goals Review:    Core Components/Risk Factors/Patient Goals at Discharge (Final Review):    ITP Comments:  ITP  Comments     Row Name 10/29/23 1406           ITP Comments Patient attend orientation today.  Patient is attending Cardiac Rehabilitation Program.  Documentation for diagnosis can be found in [facility] admission 09/18/23 with surgery on 09/23/23.  Reviewed medical chart, RPE/RPD, gym safety, and program guidelines.  Patient was fitted to equipment they will be using during rehab.  Patient is scheduled to start exercise on Monday 11/03/23 at 1100.   Initial ITP created and sent for review and signature by Dr. Dina Rich, Medical Director for Cardiac Rehabilitation Program.                Comments: Initial ITP

## 2023-11-03 ENCOUNTER — Encounter (HOSPITAL_COMMUNITY)
Admission: RE | Admit: 2023-11-03 | Discharge: 2023-11-03 | Disposition: A | Discharge status: Home / Self Care | Payer: Medicare Other | Source: Ambulatory Visit | Attending: Thoracic Surgery (Cardiothoracic Vascular Surgery) | Admitting: Thoracic Surgery (Cardiothoracic Vascular Surgery)

## 2023-11-03 DIAGNOSIS — Z951 Presence of aortocoronary bypass graft: Secondary | ICD-10-CM | POA: Diagnosis present

## 2023-11-03 NOTE — Progress Notes (Signed)
Daily Session Note  Patient Details  Name: Jesse Coleman MRN: [id number] Date of Birth: 1956-07-16 Referring Provider:   Flowsheet Row CARDIAC REHAB PHASE II ORIENTATION from 10/29/2023 in [facility] CARDIAC REHABILITATION  Referring Provider Henderickson, Viviann Spare MD  [facility] Cardiologist: Dr. Loraine Leriche Skains]       Encounter Date: 11/03/2023  Check In:  Session Check In - 11/03/23 1112       Check-In   Supervising physician immediately available to respond to emergencies See telemetry face sheet for immediately available MD    Location [facility]    Staff Present Hulen Luster, BS, RRT, CPFT;Rhonda Linan Bellaire, MA, RCEP, CCRP, Dow Adolph, RN, BSN    Virtual Visit No    Medication changes reported     No    Fall or balance concerns reported    No    Warm-up and Cool-down Performed on first and last piece of equipment    Resistance Training Performed Yes    VAD Patient? No    PAD/SET Patient? No      Pain Assessment   Currently in Pain? No/denies             Capillary Blood Glucose: No results found for this or any previous visit (from the past 24 hours).    Social History   Tobacco Use  Smoking Status Never  Smokeless Tobacco Never    Goals Met:  Exercise tolerated well Personal goals reviewed No report of concerns or symptoms today Strength training completed today  Goals Unmet:  Not Applicable  Comments: First full day of exercise!  Patient was oriented to gym and equipment including functions, settings, policies, and procedures.  Patient's individual exercise prescription and treatment plan were reviewed.  All starting workloads were established based on the results of the 6 minute walk test done at initial orientation visit.  The plan for exercise progression was also introduced and progression will be customized based on patient's performance and goals.

## 2023-11-05 ENCOUNTER — Encounter (HOSPITAL_COMMUNITY)
Admission: RE | Admit: 2023-11-05 | Discharge: 2023-11-05 | Disposition: A | Discharge status: Home / Self Care | Payer: Medicare Other | Source: Ambulatory Visit | Attending: Thoracic Surgery (Cardiothoracic Vascular Surgery) | Admitting: Thoracic Surgery (Cardiothoracic Vascular Surgery)

## 2023-11-05 DIAGNOSIS — Z951 Presence of aortocoronary bypass graft: Secondary | ICD-10-CM

## 2023-11-05 NOTE — Progress Notes (Signed)
 Daily Session Note  Patient Details  Name: Jesse Coleman MRN: [id number] Date of Birth: January 14, 1956 Referring Provider:   Flowsheet Row CARDIAC REHAB PHASE II ORIENTATION from 10/29/2023 in [facility] CARDIAC REHABILITATION  Referring Provider Henderickson, Elspeth MD  [facility] Cardiologist: Dr. Oneil Skains]       Encounter Date: 11/05/2023  Check In:  Session Check In - 11/05/23 1028       Check-In   Supervising physician immediately available to respond to emergencies See telemetry face sheet for immediately available MD    Location [facility]    Staff Present Powell Benders, BS, Exercise Physiologist;Jessica Vonzell, MA, RCEP, CCRP, CCET    Virtual Visit No    Medication changes reported     No    Fall or balance concerns reported    No    Tobacco Cessation No Change    Warm-up and Cool-down Performed on first and last piece of equipment    Resistance Training Performed Yes    VAD Patient? No    PAD/SET Patient? No      Pain Assessment   Currently in Pain? No/denies    Pain Score 0-No pain    Multiple Pain Sites No             Capillary Blood Glucose: No results found for this or any previous visit (from the past 24 hours).    Social History   Tobacco Use  Smoking Status Never  Smokeless Tobacco Never    Goals Met:  Independence with exercise equipment Exercise tolerated well No report of concerns or symptoms today Strength training completed today  Goals Unmet:  Not Applicable  Comments: Pt able to follow exercise prescription today without complaint.  Will continue to monitor for progression.

## 2023-11-07 ENCOUNTER — Encounter (HOSPITAL_COMMUNITY)
Admission: RE | Admit: 2023-11-07 | Discharge: 2023-11-07 | Disposition: A | Discharge status: Home / Self Care | Payer: Medicare Other | Source: Ambulatory Visit | Attending: Thoracic Surgery (Cardiothoracic Vascular Surgery)

## 2023-11-07 DIAGNOSIS — Z951 Presence of aortocoronary bypass graft: Secondary | ICD-10-CM

## 2023-11-07 NOTE — Progress Notes (Addendum)
 Daily Session Note  Patient Details  Name: Jesse Coleman MRN: [id number] Date of Birth: 12-23-1955 Referring Provider:   Flowsheet Row CARDIAC REHAB PHASE II ORIENTATION from 10/29/2023 in [facility] CARDIAC REHABILITATION  Referring Provider Henderickson, Elspeth MD  [facility] Cardiologist: Dr. Oneil Skains]       Encounter Date: 11/07/2023  Check In:  Session Check In - 11/07/23 1100       Check-In   Supervising physician immediately available to respond to emergencies See telemetry face sheet for immediately available MD    Location [facility]    Staff Present Powell Benders, BS, Exercise Physiologist;Debra Vicci, RN, Randye Gelineau, MA, RCEP, CCRP, CCET    Virtual Visit No    Medication changes reported     No    Fall or balance concerns reported    No    Warm-up and Cool-down Performed on first and last piece of equipment    Resistance Training Performed Yes    VAD Patient? No    PAD/SET Patient? No      Pain Assessment   Currently in Pain? No/denies             Capillary Blood Glucose: No results found for this or any previous visit (from the past 24 hours).    Social History   Tobacco Use  Smoking Status Never  Smokeless Tobacco Never    Goals Met:  Independence with exercise equipment Exercise tolerated well No report of concerns or symptoms today Strength training completed today  Goals Unmet:  Not Applicable  Comments: Pt able to follow exercise prescription today without complaint.  Will continue to monitor for progression.  Cardiac:  Reviewed home exercise with pt today.  Pt plans to walk on treadmill at home for exercise.  He also has weights and a band at home to use for resistance training. Reviewed THR, pulse, RPE, sign and symptoms, pulse oximetery and when to call 911 or MD.  Also discussed weather considerations and indoor options.  Pt voiced understanding.

## 2023-11-10 ENCOUNTER — Encounter (HOSPITAL_COMMUNITY)
Admission: RE | Admit: 2023-11-10 | Discharge: 2023-11-10 | Disposition: A | Discharge status: Home / Self Care | Payer: Medicare Other | Source: Ambulatory Visit | Attending: Thoracic Surgery (Cardiothoracic Vascular Surgery) | Admitting: Thoracic Surgery (Cardiothoracic Vascular Surgery)

## 2023-11-10 DIAGNOSIS — Z951 Presence of aortocoronary bypass graft: Secondary | ICD-10-CM

## 2023-11-10 NOTE — Progress Notes (Signed)
 Daily Session Note  Patient Details  Name: Markies Osbey MRN: [id number] Date of Birth: 1956/03/23 Referring Provider:   Flowsheet Row CARDIAC REHAB PHASE II ORIENTATION from 10/29/2023 in [facility] CARDIAC REHABILITATION  Referring Provider Henderickson, Landon Pinion MD  [facility] Cardiologist: Dr. Lavonia Powers Skains]       Encounter Date: 11/10/2023  Check In:  Session Check In - 11/10/23 1045       Check-In   Supervising physician immediately available to respond to emergencies See telemetry face sheet for immediately available ER MD    Location [facility]    Staff Present Clotilda Danish, BS, Exercise Physiologist;Tamara Kenyon Lincoln Renshaw, RN, BSN;Phyllis Billingsley, RN    Virtual Visit No    Medication changes reported     No    Fall or balance concerns reported    No    Warm-up and Cool-down Performed on first and last piece of equipment    Resistance Training Performed Yes    VAD Patient? No    PAD/SET Patient? No      Pain Assessment   Currently in Pain? No/denies    Pain Score 0-No pain    Multiple Pain Sites No             Capillary Blood Glucose: No results found for this or any previous visit (from the past 24 hours).    Social History   Tobacco Use  Smoking Status Never  Smokeless Tobacco Never    Goals Met:  Independence with exercise equipment Exercise tolerated well No report of concerns or symptoms today Strength training completed today  Goals Unmet:  Not Applicable  Comments: Pt able to follow exercise prescription today without complaint.  Will continue to monitor for progression.

## 2023-11-12 ENCOUNTER — Encounter (HOSPITAL_COMMUNITY)
Admission: RE | Admit: 2023-11-12 | Discharge: 2023-11-12 | Disposition: A | Discharge status: Home / Self Care | Payer: Medicare Other | Source: Ambulatory Visit | Attending: Thoracic Surgery (Cardiothoracic Vascular Surgery) | Admitting: Thoracic Surgery (Cardiothoracic Vascular Surgery)

## 2023-11-12 DIAGNOSIS — Z951 Presence of aortocoronary bypass graft: Secondary | ICD-10-CM | POA: Diagnosis not present

## 2023-11-12 NOTE — Progress Notes (Signed)
Daily Session Note  Patient Details  Name: Jesse Coleman MRN: [id number] Date of Birth: Apr 21, 1956 Referring Provider:   Flowsheet Row CARDIAC REHAB PHASE II ORIENTATION from 10/29/2023 in [facility] CARDIAC REHABILITATION  Referring Provider Henderickson, Viviann Spare MD  [facility] Cardiologist: Dr. Loraine Leriche Skains]       Encounter Date: 11/12/2023  Check In:  Session Check In - 11/12/23 1020       Check-In   Supervising physician immediately available to respond to emergencies See telemetry face sheet for immediately available MD    Location [facility]    Staff Present Avanell Shackleton BSN, RN;Heather Fredric Mare, BS, Exercise Physiologist;Jessica Dundee, MA, RCEP, CCRP, CCET    Virtual Visit No    Medication changes reported     No    Fall or balance concerns reported    No    Tobacco Cessation No Change    Warm-up and Cool-down Performed on first and last piece of equipment    Resistance Training Performed Yes    VAD Patient? No    PAD/SET Patient? No      Pain Assessment   Currently in Pain? No/denies    Pain Score 0-No pain    Multiple Pain Sites No             Capillary Blood Glucose: No results found for this or any previous visit (from the past 24 hours).    Social History   Tobacco Use  Smoking Status Never  Smokeless Tobacco Never    Goals Met:  Independence with exercise equipment Exercise tolerated well No report of concerns or symptoms today Strength training completed today  Goals Unmet:  Not Applicable  Comments: Marland KitchenMarland KitchenPt able to follow exercise prescription today without complaint.  Will continue to monitor for progression.

## 2023-11-14 ENCOUNTER — Encounter (HOSPITAL_COMMUNITY)
Admission: RE | Admit: 2023-11-14 | Discharge: 2023-11-14 | Disposition: A | Discharge status: Home / Self Care | Payer: Medicare Other | Source: Ambulatory Visit | Attending: Thoracic Surgery (Cardiothoracic Vascular Surgery) | Admitting: Thoracic Surgery (Cardiothoracic Vascular Surgery)

## 2023-11-14 DIAGNOSIS — Z951 Presence of aortocoronary bypass graft: Secondary | ICD-10-CM

## 2023-11-14 NOTE — Progress Notes (Signed)
Daily Session Note  Patient Details  Name: Jesse Coleman MRN: [id number] Date of Birth: 08-15-1956 Referring Provider:   Flowsheet Row CARDIAC REHAB PHASE II ORIENTATION from 10/29/2023 in [facility] CARDIAC REHABILITATION  Referring Provider Henderickson, Viviann Spare MD  [facility] Cardiologist: Dr. Loraine Leriche Skains]       Encounter Date: 11/14/2023  Check In:  Session Check In - 11/14/23 1045       Check-In   Supervising physician immediately available to respond to emergencies See telemetry face sheet for immediately available ER MD    Location [facility]    Staff Present Rodena Medin, RN, BSN;Jessica Juanetta Gosling, MA, RCEP, CCRP, CCET;Terrance Mass, RN    Virtual Visit No    Medication changes reported     No    Fall or balance concerns reported    No    Warm-up and Cool-down Performed on first and last piece of equipment    Resistance Training Performed Yes    VAD Patient? No    PAD/SET Patient? No      Pain Assessment   Currently in Pain? No/denies    Pain Score 0-No pain    Multiple Pain Sites No             Capillary Blood Glucose: No results found for this or any previous visit (from the past 24 hours).    Social History   Tobacco Use  Smoking Status Never  Smokeless Tobacco Never    Goals Met:  Independence with exercise equipment Exercise tolerated well No report of concerns or symptoms today Strength training completed today  Goals Unmet:  Not Applicable  Comments:  Pt able to follow exercise prescription today without complaint.  Will continue to monitor for progression.

## 2023-11-17 ENCOUNTER — Encounter (HOSPITAL_COMMUNITY)
Admission: RE | Admit: 2023-11-17 | Discharge: 2023-11-17 | Disposition: A | Discharge status: Home / Self Care | Payer: Medicare Other | Source: Ambulatory Visit | Attending: Thoracic Surgery (Cardiothoracic Vascular Surgery)

## 2023-11-17 DIAGNOSIS — Z951 Presence of aortocoronary bypass graft: Secondary | ICD-10-CM

## 2023-11-17 NOTE — Progress Notes (Signed)
Daily Session Note  Patient Details  Name: Cade Dashner MRN: [id number] Date of Birth: 1956-05-14 Referring Provider:   Flowsheet Row CARDIAC REHAB PHASE II ORIENTATION from 10/29/2023 in [facility] CARDIAC REHABILITATION  Referring Provider Henderickson, Viviann Spare MD  [facility] Cardiologist: Dr. Loraine Leriche Skains]       Encounter Date: 11/17/2023  Check In:  Session Check In - 11/17/23 1100       Check-In   Supervising physician immediately available to respond to emergencies See telemetry face sheet for immediately available MD    Location [facility]    Staff Present Rodena Medin, RN, BSN;Maryiah Olvey Dubach, MA, RCEP, CCRP, CCET;Laureen Ubly, BS, RRT, CPFT    Virtual Visit No    Medication changes reported     No    Fall or balance concerns reported    No    Warm-up and Cool-down Performed on first and last piece of equipment    Resistance Training Performed Yes    VAD Patient? No    PAD/SET Patient? No      Pain Assessment   Currently in Pain? No/denies             Capillary Blood Glucose: No results found for this or any previous visit (from the past 24 hours).    Social History   Tobacco Use  Smoking Status Never  Smokeless Tobacco Never    Goals Met:  Independence with exercise equipment Exercise tolerated well No report of concerns or symptoms today Strength training completed today  Goals Unmet:  Not Applicable  Comments: Pt able to follow exercise prescription today without complaint.  Will continue to monitor for progression.

## 2023-11-19 ENCOUNTER — Encounter (HOSPITAL_COMMUNITY)
Admission: RE | Admit: 2023-11-19 | Discharge: 2023-11-19 | Disposition: A | Discharge status: Home / Self Care | Payer: Medicare Other | Source: Ambulatory Visit | Attending: Thoracic Surgery (Cardiothoracic Vascular Surgery) | Admitting: Thoracic Surgery (Cardiothoracic Vascular Surgery)

## 2023-11-19 ENCOUNTER — Telehealth (INDEPENDENT_AMBULATORY_CARE_PROVIDER_SITE_OTHER): Payer: Self-pay | Admitting: Otolaryngology

## 2023-11-19 DIAGNOSIS — Z951 Presence of aortocoronary bypass graft: Secondary | ICD-10-CM

## 2023-11-19 NOTE — Progress Notes (Signed)
Daily Session Note  Patient Details  Name: Jesse Coleman MRN: [id number] Date of Birth: May 17, 1956 Referring Provider:   Flowsheet Row CARDIAC REHAB PHASE II ORIENTATION from 10/29/2023 in [facility] CARDIAC REHABILITATION  Referring Provider Henderickson, Viviann Spare MD  [facility] Cardiologist: Dr. Loraine Leriche Skains]       Encounter Date: 11/19/2023  Check In:  Session Check In - 11/19/23 1030       Check-In   Supervising physician immediately available to respond to emergencies See telemetry face sheet for immediately available MD    Location [facility]    Staff Present Ross Ludwig, BS, Exercise Physiologist;Jessica Juanetta Gosling, MA, RCEP, CCRP, CCET    Virtual Visit No    Medication changes reported     No    Fall or balance concerns reported    No    Tobacco Cessation No Change    Warm-up and Cool-down Performed on first and last piece of equipment    Resistance Training Performed Yes    VAD Patient? No    PAD/SET Patient? No      Pain Assessment   Currently in Pain? No/denies    Pain Score 0-No pain    Multiple Pain Sites No             Capillary Blood Glucose: No results found for this or any previous visit (from the past 24 hours).    Social History   Tobacco Use  Smoking Status Never  Smokeless Tobacco Never    Goals Met:  Independence with exercise equipment Exercise tolerated well No report of concerns or symptoms today Strength training completed today  Goals Unmet:  Not Applicable  Comments: Pt able to follow exercise prescription today without complaint.  Will continue to monitor for progression.

## 2023-11-19 NOTE — Telephone Encounter (Signed)
LVM with appt date, time, address & phone number

## 2023-11-20 ENCOUNTER — Institutional Professional Consult (permissible substitution) (INDEPENDENT_AMBULATORY_CARE_PROVIDER_SITE_OTHER): Payer: Medicare Other

## 2023-11-20 ENCOUNTER — Other Ambulatory Visit: Payer: Self-pay | Admitting: Physician Assistant

## 2023-11-21 ENCOUNTER — Encounter (HOSPITAL_COMMUNITY)
Admission: RE | Admit: 2023-11-21 | Discharge: 2023-11-21 | Disposition: A | Discharge status: Home / Self Care | Payer: Medicare Other | Source: Ambulatory Visit | Attending: Thoracic Surgery (Cardiothoracic Vascular Surgery) | Admitting: Thoracic Surgery (Cardiothoracic Vascular Surgery)

## 2023-11-21 DIAGNOSIS — Z951 Presence of aortocoronary bypass graft: Secondary | ICD-10-CM

## 2023-11-21 NOTE — Progress Notes (Signed)
Daily Session Note  Patient Details  Name: Jesse Coleman MRN: [id number] Date of Birth: [birth year] Referring Provider:   Flowsheet Row CARDIAC REHAB PHASE II ORIENTATION from 10/29/2023 in [facility] CARDIAC REHABILITATION  Referring Provider Henderickson, Viviann Spare MD  [facility] Cardiologist: Dr. Loraine Leriche Skains]       Encounter Date: 11/21/2023  Check In:  Session Check In - 11/21/23 1045       Check-In   Supervising physician immediately available to respond to emergencies See telemetry face sheet for immediately available ER MD    Location [facility]    Staff Present Fabio Pierce, MA, RCEP, CCRP, Dow Adolph, RN, BSN;Heather Fredric Mare, BS, Exercise Physiologist    Virtual Visit No    Medication changes reported     No    Fall or balance concerns reported    No    Warm-up and Cool-down Performed on first and last piece of equipment    Resistance Training Performed Yes    VAD Patient? No    PAD/SET Patient? No      Pain Assessment   Currently in Pain? No/denies    Pain Score 0-No pain    Multiple Pain Sites No             Capillary Blood Glucose: No results found for this or any previous visit (from the past 24 hours).    Social History   Tobacco Use  Smoking Status Never  Smokeless Tobacco Never    Goals Met:  Independence with exercise equipment Exercise tolerated well No report of concerns or symptoms today Strength training completed today  Goals Unmet:  Not Applicable  Comments: Pt able to follow exercise prescription today without complaint.  Will continue to monitor for progression.

## 2023-11-24 ENCOUNTER — Encounter (HOSPITAL_COMMUNITY): Payer: Medicare Other

## 2023-11-26 ENCOUNTER — Encounter (HOSPITAL_COMMUNITY)
Admission: RE | Admit: 2023-11-26 | Discharge: 2023-11-26 | Disposition: A | Discharge status: Home / Self Care | Payer: Medicare Other | Source: Ambulatory Visit | Attending: Thoracic Surgery (Cardiothoracic Vascular Surgery)

## 2023-11-26 ENCOUNTER — Encounter (HOSPITAL_COMMUNITY): Payer: Self-pay | Admitting: *Deleted

## 2023-11-26 DIAGNOSIS — Z951 Presence of aortocoronary bypass graft: Secondary | ICD-10-CM

## 2023-11-26 NOTE — Progress Notes (Signed)
 Cardiac Individual Treatment Plan  Patient Details  Name: Jesse Coleman MRN: [id number] Date of Birth: Mar 07, 1956 Referring Provider:   Flowsheet Row CARDIAC REHAB PHASE II ORIENTATION from 10/29/2023 in [facility] CARDIAC REHABILITATION  Referring Provider Henderickson, Viviann Spare MD  [Primary Cardiologist: Dr. Loraine Leriche Skains]       Initial Encounter Date:  Flowsheet Row CARDIAC REHAB PHASE II ORIENTATION from 10/29/2023 in [place] CARDIAC REHABILITATION  Date 10/29/23       Visit Diagnosis: S/P CABG x 4  Patient's Home Medications on Admission:  Current Outpatient Medications:    alfuzosin (UROXATRAL) 10 MG 24 hr tablet, Take 10 mg by mouth daily., Disp: , Rfl:    amiodarone (PACERONE) 200 MG tablet, Take 400 mg two times daily for 3 days;then take 200 mg two times daily for 7 days;then take 200 mg daily thereafater (Patient taking differently: Take 200 mg by mouth daily. Take 400 mg two times daily for 3 days;then take 200 mg two times daily for 7 days;then take 200 mg daily thereafater), Disp: 60 tablet, Rfl: 1   bromocriptine (PARLODEL) 2.5 MG tablet, Take 1.25 mg by mouth at bedtime. , Disp: , Rfl:    clopidogrel (PLAVIX) 75 MG tablet, Take 1 tablet (75 mg total) by mouth daily., Disp: 30 tablet, Rfl: 1   DULoxetine (CYMBALTA) 30 MG capsule, Take 30 mg by mouth daily., Disp: , Rfl:    ELIQUIS 5 MG TABS tablet, TAKE 1 TABLET BY MOUTH TWICE A DAY. (Patient taking differently: Take 5 mg by mouth 2 (two) times daily.), Disp: 60 tablet, Rfl: 0   EPINEPHrine 0.3 mg/0.3 mL IJ SOAJ injection, Inject 0.3 mLs (0.3 mg total) into the muscle as needed for anaphylaxis., Disp: 1 each, Rfl: 0   LORazepam (ATIVAN) 1 MG tablet, Take 1 mg by mouth every 8 (eight) hours as needed for anxiety., Disp: , Rfl:    metoprolol tartrate (LOPRESSOR) 25 MG tablet, Take 0.5 tablets (12.5 mg total) by mouth 2 (two) times daily., Disp: 30 tablet, Rfl: 1   nitroGLYCERIN (NITROSTAT) 0.4 MG SL tablet, Place 1 tablet  (0.4 mg total) under the tongue every 5 (five) minutes as needed for chest pain., Disp: 25 tablet, Rfl: 3   rosuvastatin (CRESTOR) 40 MG tablet, Take 1 tablet (40 mg total) by mouth every evening., Disp: 30 tablet, Rfl: 1   sildenafil (VIAGRA) 100 MG tablet, TAKE 1 TABLET BY MOUTH 30CMINUTES PRIOR TO 1HR BEFORE RELATIONS., Disp: , Rfl:    traMADol (ULTRAM) 50 MG tablet, Take 1 tablet (50 mg total) by mouth every 6 (six) hours as needed for severe pain (pain score 7-10)., Disp: 30 tablet, Rfl: 0   vitamin B-12 (CYANOCOBALAMIN) 100 MCG tablet, Take 100 mcg by mouth 3 (three) times a week., Disp: , Rfl:   Past Medical History: Past Medical History:  Diagnosis Date   CAD (coronary artery disease)    Erectile dysfunction    Hematospermia    Hyperlipidemia    Hypertension 2014   Overweight(278.02)    Prolactin-secreting pituitary adenoma (HCC) 1999   Medical therapy-1999    Tobacco Use: Social History   Tobacco Use  Smoking Status Never  Smokeless Tobacco Never    Labs: Review Flowsheet  More data may exist      Latest Ref Rng & Units 04/23/2019 09/19/2023 09/20/2023 09/22/2023 09/23/2023  Labs for ITP Cardiac and Pulmonary Rehab  Cholestrol 0 - 200 mg/dL - - 409  - -  LDL (calc) 0 - 99 mg/dL - -  80  - -  HDL-C >40 mg/dL - - 89  - -  Trlycerides <150 mg/dL - - 35  - -  Hemoglobin A1c 4.8 - 5.6 % 5.4  5.2  - 5.1  -  PH, Arterial 7.35 - 7.45 - - - 7.4  7.315  7.306  7.281  7.253  7.373  7.366  7.272  7.330   PCO2 arterial 32 - 48 mmHg - - - 35  37.2  40.8  44.5  48.2  37.8  35.5  50.8  44.9   Bicarbonate 20.0 - 28.0 mmol/L 16.9  - - 22.3  18.9  20.6  21.4  21.3  22.0  20.3  14.2  23.4  23.7   TCO2 22 - 32 mmol/L 18  - - - 20  22  23  23  22  23  23  23  21  23  15  25  19  25  26    Acid-base deficit 0.0 - 2.0 mmol/L 7.0  - - 1.9  7.0  6.0  6.0  6.0  3.0  5.0  12.0  4.0  2.0   O2 Saturation % 87.0  - - 99.2  93  95  93  97  100  100  85  100  100     Details       Multiple  values from one day are sorted in reverse-chronological order         Capillary Blood Glucose: Lab Results  Component Value Date   GLUCAP 112 (H) 09/25/2023   GLUCAP 132 (H) 09/25/2023   GLUCAP 111 (H) 09/25/2023   GLUCAP 131 (H) 09/24/2023   GLUCAP 138 (H) 09/24/2023     Exercise Target Goals: Exercise Program Goal: Individual exercise prescription set using results from initial 6 min walk test and THRR while considering  patient's activity barriers and safety.   Exercise Prescription Goal: Starting with aerobic activity 30 plus minutes a day, 3 days per week for initial exercise prescription. Provide home exercise prescription and guidelines that participant acknowledges understanding prior to discharge.  Activity Barriers & Risk Stratification:  Activity Barriers & Cardiac Risk Stratification - 10/29/23 1411       Activity Barriers & Cardiac Risk Stratification   Activity Barriers None    Cardiac Risk Stratification High             6 Minute Walk:  6 Minute Walk     Row Name 10/29/23 1408         6 Minute Walk   Phase Initial     Distance 1010 feet     Walk Time 6 minutes     # of Rest Breaks 0     MPH 1.91     METS 2.61     RPE 7     VO2 Peak 9.13     Symptoms No     Resting HR 79 bpm     Resting BP 126/74     Resting Oxygen Saturation  98 %     Exercise Oxygen Saturation  during 6 min walk 98 %     Max Ex. HR 94 bpm     Max Ex. BP 146/64     2 Minute Post BP 116/64              Oxygen Initial Assessment:   Oxygen Re-Evaluation:   Oxygen Discharge (Final Oxygen Re-Evaluation):   Initial Exercise Prescription:  Initial Exercise Prescription - 10/29/23 1400  Date of Initial Exercise RX and Referring Provider   Date 10/29/23    Referring Provider Henderickson, Viviann Spare MD   Primary Cardiologist: Dr. Donato Schultz     Oxygen   Maintain Oxygen Saturation 88% or higher      Treadmill   MPH 2    Grade 1    Minutes 15    METs  2.81      REL-XR   Level 3    Speed 50    Minutes 15    METs 2.8      Prescription Details   Frequency (times per week) 3    Duration Progress to 30 minutes of continuous aerobic without signs/symptoms of physical distress      Intensity   THRR 40-80% of Max Heartrate 108-137    Ratings of Perceived Exertion 11-13    Perceived Dyspnea 0-4      Progression   Progression Continue to progress workloads to maintain intensity without signs/symptoms of physical distress.      Resistance Training   Training Prescription Yes    Weight 5 lb    Reps 10-15             Perform Capillary Blood Glucose checks as needed.  Exercise Prescription Changes:   Exercise Prescription Changes     Row Name 10/29/23 1400 11/05/23 1200 11/07/23 1100         Response to Exercise   Blood Pressure (Admit) 126/74 96/64 --     Blood Pressure (Exercise) 146/64 142/70 --     Blood Pressure (Exit) 116/64 122/62 --     Heart Rate (Admit) 79 bpm 73 bpm --     Heart Rate (Exercise) 94 bpm 93 bpm --     Heart Rate (Exit) 80 bpm 79 bpm --     Oxygen Saturation (Admit) 98 % -- --     Oxygen Saturation (Exercise) 98 % -- --     Rating of Perceived Exertion (Exercise) 7 11 --     Symptoms none -- --     Comments walk test results -- --     Duration -- Continue with 30 min of aerobic exercise without signs/symptoms of physical distress. --     Intensity -- THRR unchanged --       Progression   Progression -- Continue to progress workloads to maintain intensity without signs/symptoms of physical distress. --       Paramedic Prescription -- Yes --     Weight -- 5 --     Reps -- 10-15 --       Treadmill   MPH -- 2.5 --     Grade -- 2 --     Minutes -- 15 --     METs -- 3.6 --       REL-XR   Level -- 3 --     Speed -- 50 --     Minutes -- 15 --     METs -- 4.9 --       Home Exercise Plan   Plans to continue exercise at -- -- Home (comment)  treadmill, weights, bands      Frequency -- -- Add 3 additional days to program exercise sessions.     Initial Home Exercises Provided -- -- 11/07/23              Exercise Comments:   Exercise Comments     Row Name 11/03/23 1116  Exercise Comments First full day of exercise!  Patient was oriented to gym and equipment including functions, settings, policies, and procedures.  Patient's individual exercise prescription and treatment plan were reviewed.  All starting workloads were established based on the results of the 6 minute walk test done at initial orientation visit.  The plan for exercise progression was also introduced and progression will be customized based on patient's performance and goals.                Exercise Goals and Review:   Exercise Goals     Row Name 10/29/23 1413             Exercise Goals   Increase Physical Activity Yes       Intervention Provide advice, education, support and counseling about physical activity/exercise needs.;Develop an individualized exercise prescription for aerobic and resistive training based on initial evaluation findings, risk stratification, comorbidities and participant's personal goals.       Expected Outcomes Short Term: Attend rehab on a regular basis to increase amount of physical activity.;Long Term: Add in home exercise to make exercise part of routine and to increase amount of physical activity.;Long Term: Exercising regularly at least 3-5 days a week.       Increase Strength and Stamina Yes       Intervention Provide advice, education, support and counseling about physical activity/exercise needs.;Develop an individualized exercise prescription for aerobic and resistive training based on initial evaluation findings, risk stratification, comorbidities and participant's personal goals.       Expected Outcomes Short Term: Perform resistance training exercises routinely during rehab and add in resistance training at home;Short Term:  Increase workloads from initial exercise prescription for resistance, speed, and METs.;Long Term: Improve cardiorespiratory fitness, muscular endurance and strength as measured by increased METs and functional capacity ( )       Able to understand and use rate of perceived exertion (RPE) scale Yes       Intervention Provide education and explanation on how to use RPE scale       Expected Outcomes Long Term:  Able to use RPE to guide intensity level when exercising independently;Short Term: Able to use RPE daily in rehab to express subjective intensity level       Able to understand and use Dyspnea scale Yes       Intervention Provide education and explanation on how to use Dyspnea scale       Expected Outcomes Long Term: Able to use Dyspnea scale to guide intensity level when exercising independently;Short Term: Able to use Dyspnea scale daily in rehab to express subjective sense of shortness of breath during exertion       Knowledge and understanding of Target Heart Rate Range (THRR) Yes       Intervention Provide education and explanation of THRR including how the numbers were predicted and where they are located for reference       Expected Outcomes Short Term: Able to state/look up THRR;Long Term: Able to use THRR to govern intensity when exercising independently;Short Term: Able to use daily as guideline for intensity in rehab       Able to check pulse independently Yes       Intervention Review the importance of being able to check your own pulse for safety during independent exercise;Provide education and demonstration on how to check pulse in carotid and radial arteries.       Expected Outcomes Short Term: Able to explain why pulse checking is important  during independent exercise;Long Term: Able to check pulse independently and accurately       Understanding of Exercise Prescription Yes       Intervention Provide education, explanation, and written materials on patient's individual exercise  prescription       Expected Outcomes Short Term: Able to explain program exercise prescription;Long Term: Able to explain home exercise prescription to exercise independently                Exercise Goals Re-Evaluation :  Exercise Goals Re-Evaluation     Row Name 11/03/23 1116 11/07/23 0819 11/07/23 1127         Exercise Goal Re-Evaluation   Exercise Goals Review Understanding of Exercise Prescription;Knowledge and understanding of Target Heart Rate Range (THRR);Able to understand and use Dyspnea scale;Able to understand and use rate of perceived exertion (RPE) scale Increase Physical Activity;Increase Strength and Stamina;Understanding of Exercise Prescription Increase Physical Activity;Increase Strength and Stamina;Able to understand and use rate of perceived exertion (RPE) scale;Able to understand and use Dyspnea scale;Knowledge and understanding of Target Heart Rate Range (THRR);Able to check pulse independently;Understanding of Exercise Prescription     Comments Reviewed RPE and dyspnea scale, THR and program prescription with pt today.  Pt voiced understanding and was given a copy of goals to take home. Kathlene November has just started rehab this week and is doing well. He is tolerating exercise and has increased his speed and grade on the treadmill already. He is at 2.5 speed with a 2 grade. Will continue to monitor and progress as able. Kathlene November is off to a good start in rehab.  He has already started to use his treadmill at home some. Reviewed home exercise with pt today.  Pt plans to walk on treadmill at home for exercise.  He also has weights and a band at home to use for resistance training. Reviewed THR, pulse, RPE, sign and symptoms, pulse oximetery and when to call 911 or MD.  Also discussed weather considerations and indoor options.  Pt voiced understanding.     Expected Outcomes Short: Use RPE daily to regulate intensity.  Long: Follow program prescription in THR. continue to attend rehab Short:  Start to add in more exercise at home on off days Long: Continue to build strength and stamina               Discharge Exercise Prescription (Final Exercise Prescription Changes):  Exercise Prescription Changes - 11/07/23 1100       Home Exercise Plan   Plans to continue exercise at Home (comment)   treadmill, weights, bands   Frequency Add 3 additional days to program exercise sessions.    Initial Home Exercises Provided 11/07/23             Nutrition:  Target Goals: Understanding of nutrition guidelines, daily intake of sodium 1500mg , cholesterol 200mg , calories 30% from fat and 7% or less from saturated fats, daily to have 5 or more servings of fruits and vegetables.  Biometrics:  Pre Biometrics - 10/29/23 1413       Pre Biometrics   Height 5\' 10"  (1.778 m)    Weight 184 lb 12.8 oz (83.8 kg)    Waist Circumference 41 inches    Hip Circumference 37 inches    Waist to Hip Ratio 1.11 %    BMI (Calculated) 26.52    Grip Strength 40.1 kg    Single Leg Stand 18.1 seconds  Nutrition Therapy Plan and Nutrition Goals:  Nutrition Therapy & Goals - 10/29/23 1414       Intervention Plan   Intervention Prescribe, educate and counsel regarding individualized specific dietary modifications aiming towards targeted core components such as weight, hypertension, lipid management, diabetes, heart failure and other comorbidities.;Nutrition handout(s) given to patient.    Expected Outcomes Short Term Goal: Understand basic principles of dietary content, such as calories, fat, sodium, cholesterol and nutrients.;Long Term Goal: Adherence to prescribed nutrition plan.             Nutrition Assessments:  MEDIFICTS Score Key: >=70 Need to make dietary changes  40-70 Heart Healthy Diet <= 40 Therapeutic Level Cholesterol Diet  Flowsheet Row CARDIAC VIRTUAL BASED CARE from 10/23/2023 in [facility] CARDIAC REHABILITATION  Picture Your Plate Total Score on  Admission 62      Picture Your Plate Scores: <14 Unhealthy dietary pattern with much room for improvement. 41-50 Dietary pattern unlikely to meet recommendations for good health and room for improvement. 51-60 More healthful dietary pattern, with some room for improvement.  >60 Healthy dietary pattern, although there may be some specific behaviors that could be improved.    Nutrition Goals Re-Evaluation:  Nutrition Goals Re-Evaluation     Row Name 11/07/23 1132             Goals   Nutrition Goal Heart Healthy diet       Comment Kathlene November adjusted his diet 12 years ago and used to be 275 lb but has worked his way down with adjusting his diet.  He avoids alcohol and red meat.  He eats a good variety of fruits and vegetables.  He no longer uses salt except for what it already in there.  He has started to read food labels and they cleared the pantry.  He was surprised at how much stuff is in food and juices.  He has lived by his 53 yo grandmother's theory to eat as close to the branch as possible.       Expected Outcome Short: Continue to work on reading food labels Long: Continue to focus on heart healthy eating.                Nutrition Goals Discharge (Final Nutrition Goals Re-Evaluation):  Nutrition Goals Re-Evaluation - 11/07/23 1132       Goals   Nutrition Goal Heart Healthy diet    Comment Kathlene November adjusted his diet 12 years ago and used to be 275 lb but has worked his way down with adjusting his diet.  He avoids alcohol and red meat.  He eats a good variety of fruits and vegetables.  He no longer uses salt except for what it already in there.  He has started to read food labels and they cleared the pantry.  He was surprised at how much stuff is in food and juices.  He has lived by his 36 yo grandmother's theory to eat as close to the branch as possible.    Expected Outcome Short: Continue to work on reading food labels Long: Continue to focus on heart healthy eating.              Psychosocial: Target Goals: Acknowledge presence or absence of significant depression and/or stress, maximize coping skills, provide positive support system. Participant is able to verbalize types and ability to use techniques and skills needed for reducing stress and depression.  Initial Review & Psychosocial Screening:  Initial Psych Review & Screening - 10/23/23 1522  Initial Review   Current issues with Current Psychotropic Meds;Current Anxiety/Panic;Current Sleep Concerns;Current Stress Concerns    Source of Stress Concerns Family      Family Dynamics   Good Support System? Yes      Barriers   Psychosocial barriers to participate in program The patient should benefit from training in stress management and relaxation.      Screening Interventions   Interventions Encouraged to exercise;To provide support and resources with identified psychosocial needs;Provide feedback about the scores to participant    Expected Outcomes Short Term goal: Utilizing psychosocial counselor, staff and physician to assist with identification of specific Stressors or current issues interfering with healing process. Setting desired goal for each stressor or current issue identified.;Long Term Goal: Stressors or current issues are controlled or eliminated.;Short Term goal: Identification and review with participant of any Quality of Life or Depression concerns found by scoring the questionnaire.;Long Term goal: The participant improves quality of Life and PHQ9 Scores as seen by post scores and/or verbalization of changes             Quality of Life Scores:  Quality of Life - 10/23/23 1443       Quality of Life   Select Quality of Life      Quality of Life Scores   Health/Function Pre 17.57 %    Socioeconomic Pre 16.31 %    Psych/Spiritual Pre 14.86 %    Family Pre 21.6 %    GLOBAL Pre 17.31 %            Scores of 19 and below usually indicate a poorer quality of life in these  areas.  A difference of  2-3 points is a clinically meaningful difference.  A difference of 2-3 points in the total score of the Quality of Life Index has been associated with significant improvement in overall quality of life, self-image, physical symptoms, and general health in studies assessing change in quality of life.  PHQ-9: Review Flowsheet       10/29/2023 08/08/2020  Depression screen PHQ 2/9  Decreased Interest 0 0  Down, Depressed, Hopeless 0 0  PHQ - 2 Score 0 0  Altered sleeping 2 -  Tired, decreased energy 1 -  Change in appetite 2 -  Feeling bad or failure about yourself  2 -  Trouble concentrating 0 -  Moving slowly or fidgety/restless 0 -  Suicidal thoughts 0 -  PHQ-9 Score 7 -  Difficult doing work/chores Not difficult at all -   Interpretation of Total Score  Total Score Depression Severity:  1-4 = Minimal depression, 5-9 = Mild depression, 10-14 = Moderate depression, 15-19 = Moderately severe depression, 20-27 = Severe depression   Psychosocial Evaluation and Intervention:  Psychosocial Evaluation - 10/23/23 1523       Psychosocial Evaluation & Interventions   Interventions Stress management education;Relaxation education;Encouraged to exercise with the program and follow exercise prescription    Comments Patient was referred to CR with CABGx4. He denies any depressions but says since his surgery he does have anxiety about his heath and the future. He also says he has trouble sleeping since his surgery due to the anxiety and surgical site discomfort. He is taking Lorazepam for sleep which helps some. He was started on Cymbalta a few months ago to help manage the stress of his wife and son's cancer diagnosis. He lives with his wife and adult son who are his support people. 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 Both his wife and son were diagnosed with kideny cancer a few months ago and this and his his own health has been  a stressor in his life. He has returned to work some. He was exercising some prior to his surgery and enjoys riding his motorcycle. His goals for the program are to keep his weight stable. He and his wife changed their diet after she was diagnosed with cancer and he has lost 30 lbs and he wants to keep this weight off. He also wants to build his energy back up and improve his muscle tone. He is looking forward to pariticpating in the program. No barriers identified for him to complete the program.    Expected Outcomes Short Term: Start the program and attend consistently. Long Term: Complete the program meeting personal goals.    Continue Psychosocial Services  Follow up required by staff             Psychosocial Re-Evaluation:  Psychosocial Re-Evaluation     Row Name 11/07/23 1129             Psychosocial Re-Evaluation   Current issues with Current Stress Concerns;Current Sleep Concerns       Comments Kathlene November is doing well in rehab.  He does have the family stressor of his 68 yo son living at home without a job.  He would like to fix it, but tries not to dwell on it too much.  He will usually start his day at 6am with the dogs.  He gets up a couple times a night to go to bathroom and will also wake up on occiassion too.  He will usually go to chair to nap and then back to bed.  He does take an afternoon nap to help.  He used to weigh 275 lb 12 years ago but fixed his diet to help lose weight and now feels confident in his body. He is also dealing with a recent diagnosis of kidney cancer in his wife.  They are also still adjusting to the new puppy in the house.       Expected Outcomes Short: Continue to use naps to get enough sleep Long; Conitnue to cope with son.       Interventions Stress management education;Encouraged to attend Cardiac Rehabilitation for the exercise       Continue Psychosocial Services  Follow up required by staff         Initial Review   Source of Stress Concerns Family                 Psychosocial Discharge (Final Psychosocial Re-Evaluation):  Psychosocial Re-Evaluation - 11/07/23 1129       Psychosocial Re-Evaluation   Current issues with Current Stress Concerns;Current Sleep Concerns    Comments Kathlene November is doing well in rehab.  He does have the family stressor of his 61 yo son living at home without a job.  He would like to fix it, but tries not to dwell on it too much.  He will usually start his day at 6am with the dogs.  He gets up a couple times a night to go to bathroom and will also wake up on occiassion too.  He will usually go to chair to nap and then back to bed.  He does take an afternoon nap to help.  He used to weigh 275 lb 12 years ago but fixed his diet to help lose weight and  now feels confident in his body. He is also dealing with a recent diagnosis of kidney cancer in his wife.  They are also still adjusting to the new puppy in the house.    Expected Outcomes Short: Continue to use naps to get enough sleep Long; Conitnue to cope with son.    Interventions Stress management education;Encouraged to attend Cardiac Rehabilitation for the exercise    Continue Psychosocial Services  Follow up required by staff      Initial Review   Source of Stress Concerns Family             Vocational Rehabilitation: Provide vocational rehab assistance to qualifying candidates.   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             Education: Education Goals: Education classes will be provided on a weekly basis, covering required topics. Participant will state understanding/return demonstration of topics presented.  Learning Barriers/Preferences:  Learning Barriers/Preferences - 10/23/23  1532       Learning Barriers/Preferences   Learning Barriers None    Learning Preferences Skilled Demonstration             Education Topics: Hypertension, Hypertension Reduction -Define heart disease and high blood pressure. Discus how high blood pressure affects the body and ways to reduce high blood pressure.   Exercise and Your Heart -Discuss why it is important to exercise, the FITT principles of exercise, normal and abnormal responses to exercise, and how to exercise safely. Flowsheet Row CARDIAC REHAB PHASE II EXERCISE from 11/19/2023 in [place] CARDIAC REHABILITATION  Date 11/12/23  Educator [facility]  Instruction Review Code 1- Verbalizes Understanding       Angina -Discuss definition of angina, causes of angina, treatment of angina, and how to decrease risk of having angina.   Cardiac Medications -Review what the following cardiac medications are used for, how they affect the body, and side effects that may occur when taking the medications.  Medications include Aspirin, Beta blockers, calcium channel blockers, ACE Inhibitors, angiotensin receptor blockers, diuretics, digoxin, and antihyperlipidemics.   Congestive Heart Failure -Discuss the definition of CHF, how to live with CHF, the signs and symptoms of CHF, and how keep track of weight and sodium intake.   Heart Disease and Intimacy -Discus the effect sexual activity has on the heart, how changes occur during intimacy as we age, and safety during sexual activity. Flowsheet Row CARDIAC REHAB PHASE II EXERCISE from 11/19/2023 in [place] CARDIAC REHABILITATION  Date 11/19/23  Educator jh  Instruction Review Code 1- Verbalizes Understanding       Smoking Cessation / COPD -Discuss different methods to quit smoking, the health benefits of quitting smoking, and the definition of COPD.   Nutrition I: Fats -Discuss the types of cholesterol, what cholesterol does to the heart, and how cholesterol levels can be  controlled.   Nutrition II: Labels -Discuss the different components of food labels and how to read food label   Heart Parts/Heart Disease and PAD -Discuss the anatomy of the heart, the pathway of blood circulation through the heart, and these are affected by heart disease.   Stress I: Signs and Symptoms -Discuss the causes of stress, how stress may lead to anxiety  and depression, and ways to limit stress. Flowsheet Row CARDIAC REHAB PHASE II EXERCISE from 11/19/2023 in [place] CARDIAC REHABILITATION  Date 11/05/23  Educator jh  Instruction Review Code 1- Verbalizes Understanding       Stress II: Relaxation -Discuss different types of relaxation techniques to limit stress.   Warning Signs of Stroke / TIA -Discuss definition of a stroke, what the signs and symptoms are of a stroke, and how to identify when someone is having stroke.   Knowledge Questionnaire Score:  Knowledge Questionnaire Score - 10/23/23 1442       Knowledge Questionnaire Score   Pre Score 22/26             Core Components/Risk Factors/Patient Goals at Admission:  Personal Goals and Risk Factors at Admission - 10/29/23 1414       Core Components/Risk Factors/Patient Goals on Admission    Weight Management Yes;Weight Maintenance    Intervention Weight Management: Develop a combined nutrition and exercise program designed to reach desired caloric intake, while maintaining appropriate intake of nutrient and fiber, sodium and fats, and appropriate energy expenditure required for the weight goal.;Weight Management: Provide education and appropriate resources to help participant work on and attain dietary goals.    Admit Weight 184 lb 12.8 oz (83.8 kg)    Goal Weight: Short Term 184 lb (83.5 kg)    Goal Weight: Long Term 184 lb (83.5 kg)    Expected Outcomes Short Term: Continue to assess and modify interventions until short term weight is achieved;Long Term: Adherence to nutrition and physical  activity/exercise program aimed toward attainment of established weight goal;Weight Maintenance: Understanding of the daily nutrition guidelines, which includes 25-35% calories from fat, 7% or less cal from saturated fats, less than 200mg  cholesterol, less than 1.5gm of sodium, & 5 or more servings of fruits and vegetables daily    Hypertension Yes    Intervention Provide education on lifestyle modifcations including regular physical activity/exercise, weight management, moderate sodium restriction and increased consumption of fresh fruit, vegetables, and low fat dairy, alcohol moderation, and smoking cessation.;Monitor prescription use compliance.    Expected Outcomes Short Term: Continued assessment and intervention until BP is < 140/69mm HG in hypertensive participants. < 130/68mm HG in hypertensive participants with diabetes, heart failure or chronic kidney disease.;Long Term: Maintenance of blood pressure at goal levels.    Lipids Yes    Intervention Provide education and support for participant on nutrition & aerobic/resistive exercise along with prescribed medications to achieve LDL 70mg , HDL >40mg .    Expected Outcomes Short Term: Participant states understanding of desired cholesterol values and is compliant with medications prescribed. Participant is following exercise prescription and nutrition guidelines.;Long Term: Cholesterol controlled with medications as prescribed, with individualized exercise RX and with personalized nutrition plan. Value goals: LDL < 70mg , HDL > 40 mg.             Core Components/Risk Factors/Patient Goals Review:   Goals and Risk Factor Review     Row Name 11/07/23 1135             Core Components/Risk Factors/Patient Goals Review   Personal Goals Review Weight Management/Obesity;Hypertension;Lipids       Review Kathlene November is doing well in rehab.  He now has a blood pressure cuff to monitor his pressures at home and they have been good.  His weight is down and  has not weighed this little in 50 yrs.  He is pleased with his progress.  He is doing well  on his medications and feeling good overall.       Expected Outcomes Short: Continue to keep eye on blood pressures Long: Continue to monitor risk factors.                Core Components/Risk Factors/Patient Goals at Discharge (Final Review):   Goals and Risk Factor Review - 11/07/23 1135       Core Components/Risk Factors/Patient Goals Review   Personal Goals Review Weight Management/Obesity;Hypertension;Lipids    Review Kathlene November is doing well in rehab.  He now has a blood pressure cuff to monitor his pressures at home and they have been good.  His weight is down and has not weighed this little in 50 yrs.  He is pleased with his progress.  He is doing well on his medications and feeling good overall.    Expected Outcomes Short: Continue to keep eye on blood pressures Long: Continue to monitor risk factors.             ITP Comments:  ITP Comments     Row Name 10/29/23 1406 11/03/23 1116 11/26/23 0830       ITP Comments Patient attend orientation today.  Patient is attending Cardiac Rehabilitation Program.  Documentation for diagnosis can be found in [facility] admission 09/18/23 with surgery on 09/23/23.  Reviewed medical chart, RPE/RPD, gym safety, and program guidelines.  Patient was fitted to equipment they will be using during rehab.  Patient is scheduled to start exercise on Monday 11/03/23 at 1100.   Initial ITP created and sent for review and signature by Dr. Dina Rich, Medical Director for Cardiac Rehabilitation Program. First full day of exercise!  Patient was oriented to gym and equipment including functions, settings, policies, and procedures.  Patient's individual exercise prescription and treatment plan were reviewed.  All starting workloads were established based on the results of the 6 minute walk test done at initial orientation visit.  The plan for exercise progression was also  introduced and progression will be customized based on patient's performance and goals. 30 day review completed. ITP sent to Dr. Dina Rich, Medical Director of Cardiac Rehab. Continue with ITP unless changes are made by physician.              Comments: 30 day review

## 2023-11-26 NOTE — Progress Notes (Signed)
 Daily Session Note  Patient Details  Name: Jesse Coleman MRN: [id number] Date of Birth: 1955-11-17 Referring Provider:   Flowsheet Row CARDIAC REHAB PHASE II ORIENTATION from 10/29/2023 in [facility] CARDIAC REHABILITATION  Referring Provider Henderickson, Viviann Spare MD  [facility] Cardiologist: Dr. Loraine Leriche Skains]       Encounter Date: 11/26/2023  Check In:  Session Check In - 11/26/23 1029       Check-In   Supervising physician immediately available to respond to emergencies See telemetry face sheet for immediately available MD    Location [facility]    Staff Present Ross Ludwig, BS, Exercise Physiologist;Jessica Juanetta Gosling, MA, RCEP, CCRP, CCET;Brittany Foley, BSN, RN    Virtual Visit No    Medication changes reported     No    Fall or balance concerns reported    No    Tobacco Cessation No Change    Warm-up and Cool-down Performed on first and last piece of equipment    Resistance Training Performed Yes    VAD Patient? No    PAD/SET Patient? No      Pain Assessment   Currently in Pain? No/denies    Pain Score 0-No pain    Multiple Pain Sites No             Capillary Blood Glucose: No results found for this or any previous visit (from the past 24 hours).    Social History   Tobacco Use  Smoking Status Never  Smokeless Tobacco Never    Goals Met:  Independence with exercise equipment Exercise tolerated well No report of concerns or symptoms today Strength training completed today  Goals Unmet:  Not Applicable  Comments: Pt able to follow exercise prescription today without complaint.  Will continue to monitor for progression.

## 2023-11-28 ENCOUNTER — Encounter (HOSPITAL_COMMUNITY)
Admission: RE | Admit: 2023-11-28 | Discharge: 2023-11-28 | Disposition: A | Discharge status: Home / Self Care | Payer: Medicare Other | Source: Ambulatory Visit | Attending: Thoracic Surgery (Cardiothoracic Vascular Surgery) | Admitting: Thoracic Surgery (Cardiothoracic Vascular Surgery)

## 2023-11-28 DIAGNOSIS — Z951 Presence of aortocoronary bypass graft: Secondary | ICD-10-CM | POA: Diagnosis not present

## 2023-11-28 NOTE — Progress Notes (Signed)
 Daily Session Note  Patient Details  Name: Smith Potenza MRN: [id number] Date of Birth: [birth year] Referring Provider:   Flowsheet Row CARDIAC REHAB PHASE II ORIENTATION from 10/29/2023 in [facility] CARDIAC REHABILITATION  Referring Provider Henderickson, Viviann Spare MD  [facility] Cardiologist: Dr. Loraine Leriche Skains]       Encounter Date: 11/28/2023  Check In:  Session Check In - 11/28/23 1045       Check-In   Supervising physician immediately available to respond to emergencies See telemetry face sheet for immediately available MD    Location [facility]    Staff Present Desiree Lucy, BSN, RN, Sherlyn Hay, MA, RCEP, CCRP, CCET    Virtual Visit No    Medication changes reported     No    Fall or balance concerns reported    No    Tobacco Cessation No Change    Warm-up and Cool-down Performed on first and last piece of equipment    Resistance Training Performed Yes    VAD Patient? No    PAD/SET Patient? No      Pain Assessment   Currently in Pain? No/denies             Capillary Blood Glucose: No results found for this or any previous visit (from the past 24 hours).    Social History   Tobacco Use  Smoking Status Never  Smokeless Tobacco Never    Goals Met:  Independence with exercise equipment Changing diet to healthy choices, watching portion sizes Exercise tolerated well Personal goals reviewed No report of concerns or symptoms today Strength training completed today  Goals Unmet:  Not Applicable  Comments: Pt able to follow exercise prescription today without complaint.  Will continue to monitor for progression.

## 2023-12-01 ENCOUNTER — Encounter (HOSPITAL_COMMUNITY)
Admission: RE | Admit: 2023-12-01 | Discharge: 2023-12-01 | Disposition: A | Discharge status: Home / Self Care | Payer: Medicare Other | Source: Ambulatory Visit | Attending: Thoracic Surgery (Cardiothoracic Vascular Surgery) | Admitting: Thoracic Surgery (Cardiothoracic Vascular Surgery)

## 2023-12-01 DIAGNOSIS — Z951 Presence of aortocoronary bypass graft: Secondary | ICD-10-CM | POA: Diagnosis present

## 2023-12-01 NOTE — Progress Notes (Signed)
 Daily Session Note  Patient Details  Name: Jesse Coleman MRN: [id number] Date of Birth: 09-Jul-1956 Referring Provider:   Flowsheet Row CARDIAC REHAB PHASE II ORIENTATION from 10/29/2023 in [facility] CARDIAC REHABILITATION  Referring Provider Henderickson, Viviann Spare MD  [facility] Cardiologist: Dr. Loraine Leriche Skains]       Encounter Date: 12/01/2023  Check In:  Session Check In - 12/01/23 1057       Check-In   Supervising physician immediately available to respond to emergencies See telemetry face sheet for immediately available MD    Staff Present Ross Ludwig, BS, Exercise Physiologist;Delvina Mizzell Juanetta Gosling, MA, RCEP, CCRP, Dow Adolph, RN, BSN    Virtual Visit No    Medication changes reported     No    Fall or balance concerns reported    No    Warm-up and Cool-down Performed on first and last piece of equipment    Resistance Training Performed Yes    VAD Patient? No    PAD/SET Patient? No      Pain Assessment   Currently in Pain? No/denies             Capillary Blood Glucose: No results found for this or any previous visit (from the past 24 hours).    Social History   Tobacco Use  Smoking Status Never  Smokeless Tobacco Never    Goals Met:  Independence with exercise equipment Exercise tolerated well No report of concerns or symptoms today Strength training completed today  Goals Unmet:  Not Applicable  Comments: Pt able to follow exercise prescription today without complaint.  Will continue to monitor for progression.

## 2023-12-03 ENCOUNTER — Encounter (HOSPITAL_COMMUNITY)
Admission: RE | Admit: 2023-12-03 | Discharge: 2023-12-03 | Disposition: A | Discharge status: Home / Self Care | Payer: Medicare Other | Source: Ambulatory Visit | Attending: Thoracic Surgery (Cardiothoracic Vascular Surgery) | Admitting: Thoracic Surgery (Cardiothoracic Vascular Surgery)

## 2023-12-03 DIAGNOSIS — Z951 Presence of aortocoronary bypass graft: Secondary | ICD-10-CM | POA: Diagnosis not present

## 2023-12-03 NOTE — Progress Notes (Signed)
 Daily Session Note  Patient Details  Name: Jesse Coleman MRN: [id number] Date of Birth: 12/28/1955 Referring Provider:   Flowsheet Row CARDIAC REHAB PHASE II ORIENTATION from 10/29/2023 in [facility] CARDIAC REHABILITATION  Referring Provider Henderickson, Viviann Spare MD  [facility] Cardiologist: Dr. Loraine Leriche Skains]       Encounter Date: 12/03/2023  Check In:  Session Check In - 12/03/23 1029       Check-In   Supervising physician immediately available to respond to emergencies See telemetry face sheet for immediately available MD    Location [facility]    Staff Present Fabio Pierce, MA, RCEP, CCRP, CCET;Mehar Sagen Gaynell Face, Exercise Physiologist;Hillary Leonidas Romberg BSN, RN    Virtual Visit No    Medication changes reported     No    Fall or balance concerns reported    No    Tobacco Cessation No Change    Warm-up and Cool-down Performed on first and last piece of equipment    Resistance Training Performed Yes    VAD Patient? No    PAD/SET Patient? No      Pain Assessment   Currently in Pain? No/denies    Pain Score 0-No pain    Multiple Pain Sites No             Capillary Blood Glucose: No results found for this or any previous visit (from the past 24 hours).    Social History   Tobacco Use  Smoking Status Never  Smokeless Tobacco Never    Goals Met:  Independence with exercise equipment Exercise tolerated well No report of concerns or symptoms today Strength training completed today  Goals Unmet:  Not Applicable  Comments: Pt able to follow exercise prescription today without complaint.  Will continue to monitor for progression.

## 2023-12-05 ENCOUNTER — Encounter (HOSPITAL_COMMUNITY)
Admission: RE | Admit: 2023-12-05 | Discharge: 2023-12-05 | Disposition: A | Discharge status: Home / Self Care | Payer: Medicare Other | Source: Ambulatory Visit | Attending: Thoracic Surgery (Cardiothoracic Vascular Surgery) | Admitting: Thoracic Surgery (Cardiothoracic Vascular Surgery)

## 2023-12-05 DIAGNOSIS — Z951 Presence of aortocoronary bypass graft: Secondary | ICD-10-CM

## 2023-12-05 NOTE — Progress Notes (Signed)
 Daily Session Note  Patient Details  Name: Jesse Coleman MRN: [id number] Date of Birth: Jan 31, 1956 Referring Provider:   Flowsheet Row CARDIAC REHAB PHASE II ORIENTATION from 10/29/2023 in [facility] CARDIAC REHABILITATION  Referring Provider Henderickson, Viviann Spare MD  [facility] Cardiologist: Dr. Loraine Leriche Skains]       Encounter Date: 12/05/2023  Check In:  Session Check In - 12/05/23 1100       Check-In   Supervising physician immediately available to respond to emergencies See telemetry face sheet for immediately available MD    Location [facility]    Staff Present Fabio Pierce, MA, RCEP, CCRP, CCET;Zafir Schauer Fredric Mare, BS, Exercise Physiologist;Debra Laural Benes, RN, BSN;Brittany Roseanne Reno, BSN, RN, WTA-C    Virtual Visit No    Medication changes reported     No    Fall or balance concerns reported    No    Tobacco Cessation No Change    Warm-up and Cool-down Performed on first and last piece of equipment    Resistance Training Performed Yes    VAD Patient? No    PAD/SET Patient? No      Pain Assessment   Currently in Pain? No/denies    Pain Score 0-No pain    Multiple Pain Sites No             Capillary Blood Glucose: No results found for this or any previous visit (from the past 24 hours).    Social History   Tobacco Use  Smoking Status Never  Smokeless Tobacco Never    Goals Met:  Independence with exercise equipment Exercise tolerated well No report of concerns or symptoms today Strength training completed today  Goals Unmet:  Not Applicable  Comments: Pt able to follow exercise prescription today without complaint.  Will continue to monitor for progression.

## 2023-12-08 ENCOUNTER — Encounter (HOSPITAL_COMMUNITY)
Admission: RE | Admit: 2023-12-08 | Discharge: 2023-12-08 | Disposition: A | Discharge status: Home / Self Care | Source: Ambulatory Visit | Attending: Thoracic Surgery (Cardiothoracic Vascular Surgery)

## 2023-12-08 ENCOUNTER — Encounter (HOSPITAL_COMMUNITY): Admission: RE | Admit: 2023-12-08 | Payer: Medicare Other | Source: Ambulatory Visit

## 2023-12-08 DIAGNOSIS — Z951 Presence of aortocoronary bypass graft: Secondary | ICD-10-CM

## 2023-12-08 NOTE — Progress Notes (Signed)
 Daily Session Note  Patient Details  Name: Jesse Coleman MRN: [id number] Date of Birth: 07/31/1956 Referring Provider:   Flowsheet Row CARDIAC REHAB PHASE II ORIENTATION from 10/29/2023 in [facility] CARDIAC REHABILITATION  Referring Provider Henderickson, Viviann Spare MD  [facility] Cardiologist: Dr. Loraine Leriche Skains]       Encounter Date: 12/08/2023  Check In:  Session Check In - 12/08/23 1059       Check-In   Supervising physician immediately available to respond to emergencies See telemetry face sheet for immediately available MD    Location [facility]    Staff Present Fabio Pierce, MA, RCEP, CCRP, CCET;Landry Lookingbill Fredric Mare, BS, Exercise Physiologist;Brittany Roseanne Reno, BSN, RN, WTA-C    Virtual Visit No    Medication changes reported     No    Fall or balance concerns reported    No    Tobacco Cessation No Change    Warm-up and Cool-down Performed on first and last piece of equipment    Resistance Training Performed Yes    VAD Patient? No    PAD/SET Patient? No      Pain Assessment   Currently in Pain? No/denies    Pain Score 0-No pain    Multiple Pain Sites No             Capillary Blood Glucose: No results found for this or any previous visit (from the past 24 hours).    Social History   Tobacco Use  Smoking Status Never  Smokeless Tobacco Never    Goals Met:  Independence with exercise equipment Exercise tolerated well No report of concerns or symptoms today Strength training completed today  Goals Unmet:  Not Applicable  Comments: Pt able to follow exercise prescription today without complaint.  Will continue to monitor for progression.

## 2023-12-10 ENCOUNTER — Encounter (HOSPITAL_COMMUNITY)
Admission: RE | Admit: 2023-12-10 | Discharge: 2023-12-10 | Disposition: A | Discharge status: Home / Self Care | Payer: Medicare Other | Source: Ambulatory Visit | Attending: Thoracic Surgery (Cardiothoracic Vascular Surgery) | Admitting: Thoracic Surgery (Cardiothoracic Vascular Surgery)

## 2023-12-10 DIAGNOSIS — Z951 Presence of aortocoronary bypass graft: Secondary | ICD-10-CM

## 2023-12-10 NOTE — Progress Notes (Signed)
 Daily Session Note  Patient Details  Name: Barry Faircloth MRN: [id number] Date of Birth: 03-11-1956 Referring Provider:   Flowsheet Row CARDIAC REHAB PHASE II ORIENTATION from 10/29/2023 in [facility] CARDIAC REHABILITATION  Referring Provider Henderickson, Viviann Spare MD  [facility] Cardiologist: Dr. Loraine Leriche Skains]       Encounter Date: 12/10/2023  Check In:  Session Check In - 12/10/23 1038       Check-In   Supervising physician immediately available to respond to emergencies See telemetry face sheet for immediately available MD    Location [facility]    Staff Present Ross Ludwig, BS, Exercise Physiologist;Jessica Juanetta Gosling, MA, RCEP, CCRP, CCET;Hillary Troutman BSN, RN    Virtual Visit No    Medication changes reported     No    Fall or balance concerns reported    No    Tobacco Cessation No Change    Warm-up and Cool-down Performed on first and last piece of equipment    Resistance Training Performed Yes    VAD Patient? No    PAD/SET Patient? No      Pain Assessment   Currently in Pain? No/denies    Pain Score 0-No pain    Multiple Pain Sites No             Capillary Blood Glucose: No results found for this or any previous visit (from the past 24 hours).    Social History   Tobacco Use  Smoking Status Never  Smokeless Tobacco Never    Goals Met:  Independence with exercise equipment Exercise tolerated well No report of concerns or symptoms today Strength training completed today  Goals Unmet:  Not Applicable  Comments: Pt able to follow exercise prescription today without complaint.  Will continue to monitor for progression.

## 2023-12-12 ENCOUNTER — Encounter (HOSPITAL_COMMUNITY)
Admission: RE | Admit: 2023-12-12 | Discharge: 2023-12-12 | Disposition: A | Discharge status: Home / Self Care | Payer: Medicare Other | Source: Ambulatory Visit | Attending: Thoracic Surgery (Cardiothoracic Vascular Surgery) | Admitting: Thoracic Surgery (Cardiothoracic Vascular Surgery)

## 2023-12-12 DIAGNOSIS — Z951 Presence of aortocoronary bypass graft: Secondary | ICD-10-CM | POA: Diagnosis not present

## 2023-12-12 NOTE — Progress Notes (Signed)
 Daily Session Note  Patient Details  Name: Jesse Coleman MRN: [id number] Date of Birth: Feb 16, 1956 Referring Provider:   Flowsheet Row CARDIAC REHAB PHASE II ORIENTATION from 10/29/2023 in [facility] CARDIAC REHABILITATION  Referring Provider Henderickson, Viviann Spare MD  [facility] Cardiologist: Dr. Loraine Leriche Skains]       Encounter Date: 12/12/2023  Check In:  Session Check In - 12/12/23 1113       Check-In   Supervising physician immediately available to respond to emergencies See telemetry face sheet for immediately available MD    Location [facility]    Staff Present Fabio Pierce, MA, RCEP, CCRP, Dow Adolph, RN, BSN;Addelynn Batte Roseanne Reno, BSN, RN, WTA-C    Virtual Visit No    Medication changes reported     No    Fall or balance concerns reported    No    Tobacco Cessation No Change    Warm-up and Cool-down Performed on first and last piece of equipment    Resistance Training Performed Yes    VAD Patient? No    PAD/SET Patient? No      Pain Assessment   Currently in Pain? No/denies             Capillary Blood Glucose: No results found for this or any previous visit (from the past 24 hours).    Social History   Tobacco Use  Smoking Status Never  Smokeless Tobacco Never    Goals Met:  Independence with exercise equipment Exercise tolerated well No report of concerns or symptoms today Strength training completed today  Goals Unmet:  Not Applicable  Comments: Pt able to follow exercise prescription today without complaint.  Will continue to monitor for progression.

## 2023-12-15 ENCOUNTER — Other Ambulatory Visit: Payer: Self-pay | Admitting: Physician Assistant

## 2023-12-15 ENCOUNTER — Encounter (HOSPITAL_COMMUNITY)
Admission: RE | Admit: 2023-12-15 | Discharge: 2023-12-15 | Disposition: A | Discharge status: Home / Self Care | Payer: Medicare Other | Source: Ambulatory Visit | Attending: Thoracic Surgery (Cardiothoracic Vascular Surgery)

## 2023-12-15 DIAGNOSIS — Z951 Presence of aortocoronary bypass graft: Secondary | ICD-10-CM | POA: Diagnosis not present

## 2023-12-15 NOTE — Progress Notes (Signed)
 Daily Session Note  Patient Details  Name: Jesse Coleman MRN: [id number] Date of Birth: 1956/05/09 Referring Provider:   Flowsheet Row CARDIAC REHAB PHASE II ORIENTATION from 10/29/2023 in [facility] CARDIAC REHABILITATION  Referring Provider Henderickson, Viviann Spare MD  [facility] Cardiologist: Dr. Loraine Leriche Skains]       Encounter Date: 12/15/2023  Check In:  Session Check In - 12/15/23 1100       Check-In   Supervising physician immediately available to respond to emergencies See telemetry face sheet for immediately available MD    Location [facility]    Staff Present Ross Ludwig, BS, Exercise Physiologist;Brittany Roseanne Reno, BSN, RN, WTA-C    Virtual Visit No    Medication changes reported     No    Fall or balance concerns reported    No    Tobacco Cessation No Change    Warm-up and Cool-down Performed on first and last piece of equipment    Resistance Training Performed Yes    VAD Patient? No    PAD/SET Patient? No      Pain Assessment   Currently in Pain? No/denies    Pain Score 0-No pain    Multiple Pain Sites No             Capillary Blood Glucose: No results found for this or any previous visit (from the past 24 hours).    Social History   Tobacco Use  Smoking Status Never  Smokeless Tobacco Never    Goals Met:  Independence with exercise equipment Exercise tolerated well Strength training completed today  Goals Unmet:  Not Applicable  Comments: Pt able to follow exercise prescription today without complaint.  Will continue to monitor for progression.

## 2023-12-17 ENCOUNTER — Encounter (HOSPITAL_COMMUNITY)
Admission: RE | Admit: 2023-12-17 | Discharge: 2023-12-17 | Disposition: A | Discharge status: Home / Self Care | Payer: Medicare Other | Source: Ambulatory Visit | Attending: Thoracic Surgery (Cardiothoracic Vascular Surgery)

## 2023-12-17 DIAGNOSIS — Z951 Presence of aortocoronary bypass graft: Secondary | ICD-10-CM

## 2023-12-17 NOTE — Progress Notes (Signed)
 Daily Session Note  Patient Details  Name: Jesse Coleman MRN: [id number] Date of Birth: 1956-07-28 Referring Provider:   Flowsheet Row CARDIAC REHAB PHASE II ORIENTATION from 10/29/2023 in [facility] CARDIAC REHABILITATION  Referring Provider Henderickson, Viviann Spare MD  [facility] Cardiologist: Dr. Loraine Leriche Skains]       Encounter Date: 12/17/2023  Check In:  Session Check In - 12/17/23 1033       Check-In   Supervising physician immediately available to respond to emergencies See telemetry face sheet for immediately available MD    Location [facility]    Staff Present Avanell Shackleton BSN, RN;Heather Fredric Mare, BS, Exercise Physiologist;Debra Laural Benes, RN, BSN    Virtual Visit No    Medication changes reported     No    Fall or balance concerns reported    No    Tobacco Cessation No Change    Warm-up and Cool-down Performed on first and last piece of equipment    Resistance Training Performed Yes    VAD Patient? No    PAD/SET Patient? No      Pain Assessment   Currently in Pain? No/denies    Pain Score 0-No pain    Multiple Pain Sites No             Capillary Blood Glucose: No results found for this or any previous visit (from the past 24 hours).    Social History   Tobacco Use  Smoking Status Never  Smokeless Tobacco Never    Goals Met:  Independence with exercise equipment Exercise tolerated well No report of concerns or symptoms today Strength training completed today  Goals Unmet:  Not Applicable  Comments: Marland KitchenMarland KitchenPt able to follow exercise prescription today without complaint.  Will continue to monitor for progression.

## 2023-12-19 ENCOUNTER — Encounter (HOSPITAL_COMMUNITY)
Admission: RE | Admit: 2023-12-19 | Discharge: 2023-12-19 | Disposition: A | Discharge status: Home / Self Care | Payer: Medicare Other | Source: Ambulatory Visit | Attending: Thoracic Surgery (Cardiothoracic Vascular Surgery) | Admitting: Thoracic Surgery (Cardiothoracic Vascular Surgery)

## 2023-12-19 DIAGNOSIS — Z951 Presence of aortocoronary bypass graft: Secondary | ICD-10-CM | POA: Diagnosis not present

## 2023-12-19 NOTE — Progress Notes (Signed)
 Daily Session Note  Patient Details  Name: Jesse Coleman MRN: [id number] Date of Birth: April 10, 1956 Referring Provider:   Flowsheet Row CARDIAC REHAB PHASE II ORIENTATION from 10/29/2023 in [facility] CARDIAC REHABILITATION  Referring Provider Henderickson, Viviann Spare MD  [facility] Cardiologist: Dr. Loraine Leriche Skains]       Encounter Date: 12/19/2023  Check In:  Session Check In - 12/19/23 1057       Check-In   Supervising physician immediately available to respond to emergencies See telemetry face sheet for immediately available MD    Location [facility]    Staff Present Rodena Medin, RN, BSN;Heather Fredric Mare, BS, Exercise Physiologist;Lazlo Tunney Roseanne Reno, BSN, RN, Sherlyn Hay, MA, RCEP, CCRP, CCET    Virtual Visit No    Medication changes reported     No    Fall or balance concerns reported    No    Tobacco Cessation No Change    Warm-up and Cool-down Performed on first and last piece of equipment    Resistance Training Performed Yes    VAD Patient? No    PAD/SET Patient? No      Pain Assessment   Currently in Pain? No/denies    Pain Score 0-No pain             Capillary Blood Glucose: No results found for this or any previous visit (from the past 24 hours).    Social History   Tobacco Use  Smoking Status Never  Smokeless Tobacco Never    Goals Met:  Independence with exercise equipment No report of concerns or symptoms today Strength training completed today  Goals Unmet:  Not Applicable  Comments: Pt able to follow exercise prescription today without complaint.  Will continue to monitor for progression.

## 2023-12-22 ENCOUNTER — Encounter (HOSPITAL_COMMUNITY)
Admission: RE | Admit: 2023-12-22 | Discharge: 2023-12-22 | Disposition: A | Discharge status: Home / Self Care | Payer: Medicare Other | Source: Ambulatory Visit | Attending: Thoracic Surgery (Cardiothoracic Vascular Surgery)

## 2023-12-22 DIAGNOSIS — Z951 Presence of aortocoronary bypass graft: Secondary | ICD-10-CM | POA: Diagnosis not present

## 2023-12-22 NOTE — Progress Notes (Signed)
 Daily Session Note  Patient Details  Name: Jesse Coleman MRN: [id number] Date of Birth: 1956-03-14 Referring Provider:   Flowsheet Row CARDIAC REHAB PHASE II ORIENTATION from 10/29/2023 in [facility] CARDIAC REHABILITATION  Referring Provider Henderickson, Viviann Spare MD  [facility] Cardiologist: Dr. Loraine Leriche Skains]       Encounter Date: 12/22/2023  Check In:  Session Check In - 12/22/23 1045       Check-In   Supervising physician immediately available to respond to emergencies See telemetry face sheet for immediately available MD    Location [facility]    Staff Present Rodena Medin, RN, BSN;Jessica Juanetta Gosling, MA, RCEP, CCRP, CCET    Virtual Visit No    Medication changes reported     No    Fall or balance concerns reported    No    Warm-up and Cool-down Performed on first and last piece of equipment    Resistance Training Performed Yes    VAD Patient? No    PAD/SET Patient? No      Pain Assessment   Currently in Pain? No/denies    Pain Score 0-No pain    Multiple Pain Sites No             Capillary Blood Glucose: No results found for this or any previous visit (from the past 24 hours).    Social History   Tobacco Use  Smoking Status Never  Smokeless Tobacco Never    Goals Met:  Independence with exercise equipment Exercise tolerated well No report of concerns or symptoms today Strength training completed today  Goals Unmet:  Not Applicable  Comments: Pt able to follow exercise prescription today without complaint.  Will continue to monitor for progression.

## 2023-12-24 ENCOUNTER — Encounter (HOSPITAL_COMMUNITY)
Admission: RE | Admit: 2023-12-24 | Discharge: 2023-12-24 | Disposition: A | Discharge status: Home / Self Care | Payer: Medicare Other | Source: Ambulatory Visit | Attending: Thoracic Surgery (Cardiothoracic Vascular Surgery) | Admitting: Thoracic Surgery (Cardiothoracic Vascular Surgery)

## 2023-12-24 ENCOUNTER — Encounter (HOSPITAL_COMMUNITY): Payer: Self-pay | Admitting: *Deleted

## 2023-12-24 DIAGNOSIS — Z951 Presence of aortocoronary bypass graft: Secondary | ICD-10-CM

## 2023-12-24 NOTE — Progress Notes (Signed)
 Daily Session Note  Patient Details  Name: Jesse Coleman MRN: [id number] Date of Birth: 11-09-1955 Referring Provider:   Flowsheet Row CARDIAC REHAB PHASE II ORIENTATION from 10/29/2023 in [facility] CARDIAC REHABILITATION  Referring Provider Henderickson, Viviann Spare MD  [facility] Cardiologist: Dr. Loraine Leriche Skains]       Encounter Date: 12/24/2023  Check In:  Session Check In - 12/24/23 1020       Check-In   Supervising physician immediately available to respond to emergencies See telemetry face sheet for immediately available MD    Location [facility]    Staff Present Avanell Shackleton BSN, RN;Heather Fredric Mare, BS, Exercise Physiologist;Jessica [place], MA, RCEP, CCRP, CCET    Virtual Visit No    Medication changes reported     No    Fall or balance concerns reported    No    Tobacco Cessation No Change    Warm-up and Cool-down Performed on first and last piece of equipment    Resistance Training Performed Yes    VAD Patient? No    PAD/SET Patient? No      Pain Assessment   Currently in Pain? No/denies    Pain Score 0-No pain    Multiple Pain Sites No             Capillary Blood Glucose: No results found for this or any previous visit (from the past 24 hours).    Social History   Tobacco Use  Smoking Status Never  Smokeless Tobacco Never    Goals Met:  Independence with exercise equipment Exercise tolerated well No report of concerns or symptoms today Strength training completed today  Goals Unmet:  Not Applicable  Comments: Marland KitchenMarland KitchenPt able to follow exercise prescription today without complaint.  Will continue to monitor for progression.

## 2023-12-24 NOTE — Progress Notes (Signed)
 Cardiac Individual Treatment Plan  Patient Details  Name: Jesse Coleman MRN: [id number] Date of Birth: 19-Jul-1956 Referring Provider:   Flowsheet Row CARDIAC REHAB PHASE II ORIENTATION from 10/29/2023 in [facility] CARDIAC REHABILITATION  Referring Provider Henderickson, Viviann Spare MD  [Primary Cardiologist: Dr. Loraine Leriche Skains]       Initial Encounter Date:  Flowsheet Row CARDIAC REHAB PHASE II ORIENTATION from 10/29/2023 in [place] CARDIAC REHABILITATION  Date 10/29/23       Visit Diagnosis: S/P CABG x 4  Patient's Home Medications on Admission:  Current Outpatient Medications:    alfuzosin (UROXATRAL) 10 MG 24 hr tablet, Take 10 mg by mouth daily., Disp: , Rfl:    amiodarone (PACERONE) 200 MG tablet, Take 400 mg two times daily for 3 days;then take 200 mg two times daily for 7 days;then take 200 mg daily thereafater (Patient taking differently: Take 200 mg by mouth daily. Take 400 mg two times daily for 3 days;then take 200 mg two times daily for 7 days;then take 200 mg daily thereafater), Disp: 60 tablet, Rfl: 1   bromocriptine (PARLODEL) 2.5 MG tablet, Take 1.25 mg by mouth at bedtime. , Disp: , Rfl:    clopidogrel (PLAVIX) 75 MG tablet, Take 1 tablet (75 mg total) by mouth daily., Disp: 30 tablet, Rfl: 1   DULoxetine (CYMBALTA) 30 MG capsule, Take 30 mg by mouth daily., Disp: , Rfl:    ELIQUIS 5 MG TABS tablet, TAKE 1 TABLET BY MOUTH TWICE A DAY. (Patient taking differently: Take 5 mg by mouth 2 (two) times daily.), Disp: 60 tablet, Rfl: 0   EPINEPHrine 0.3 mg/0.3 mL IJ SOAJ injection, Inject 0.3 mLs (0.3 mg total) into the muscle as needed for anaphylaxis., Disp: 1 each, Rfl: 0   LORazepam (ATIVAN) 1 MG tablet, Take 1 mg by mouth every 8 (eight) hours as needed for anxiety., Disp: , Rfl:    metoprolol tartrate (LOPRESSOR) 25 MG tablet, Take 0.5 tablets (12.5 mg total) by mouth 2 (two) times daily., Disp: 30 tablet, Rfl: 1   nitroGLYCERIN (NITROSTAT) 0.4 MG SL tablet, Place 1 tablet  (0.4 mg total) under the tongue every 5 (five) minutes as needed for chest pain., Disp: 25 tablet, Rfl: 3   rosuvastatin (CRESTOR) 40 MG tablet, Take 1 tablet (40 mg total) by mouth every evening., Disp: 30 tablet, Rfl: 1   sildenafil (VIAGRA) 100 MG tablet, TAKE 1 TABLET BY MOUTH 30CMINUTES PRIOR TO 1HR BEFORE RELATIONS., Disp: , Rfl:    traMADol (ULTRAM) 50 MG tablet, Take 1 tablet (50 mg total) by mouth every 6 (six) hours as needed for severe pain (pain score 7-10)., Disp: 30 tablet, Rfl: 0   vitamin B-12 (CYANOCOBALAMIN) 100 MCG tablet, Take 100 mcg by mouth 3 (three) times a week., Disp: , Rfl:   Past Medical History: Past Medical History:  Diagnosis Date   CAD (coronary artery disease)    Erectile dysfunction    Hematospermia    Hyperlipidemia    Hypertension 2014   Overweight(278.02)    Prolactin-secreting pituitary adenoma (HCC) 1999   Medical therapy-1999    Tobacco Use: Social History   Tobacco Use  Smoking Status Never  Smokeless Tobacco Never    Labs: Review Flowsheet  More data may exist      Latest Ref Rng & Units 04/23/2019 09/19/2023 09/20/2023 09/22/2023 09/23/2023  Labs for ITP Cardiac and Pulmonary Rehab  Cholestrol 0 - 200 mg/dL - - 213  - -  LDL (calc) 0 - 99 mg/dL - -  80  - -  HDL-C >40 mg/dL - - 89  - -  Trlycerides <150 mg/dL - - 35  - -  Hemoglobin A1c 4.8 - 5.6 % 5.4  5.2  - 5.1  -  PH, Arterial 7.35 - 7.45 - - - 7.4  7.315  7.306  7.281  7.253  7.373  7.366  7.272  7.330   PCO2 arterial 32 - 48 mmHg - - - 35  37.2  40.8  44.5  48.2  37.8  35.5  50.8  44.9   Bicarbonate 20.0 - 28.0 mmol/L 16.9  - - 22.3  18.9  20.6  21.4  21.3  22.0  20.3  14.2  23.4  23.7   TCO2 22 - 32 mmol/L 18  - - - 20  22  23  23  22  23  23  23  21  23  15  25  19  25  26    Acid-base deficit 0.0 - 2.0 mmol/L 7.0  - - 1.9  7.0  6.0  6.0  6.0  3.0  5.0  12.0  4.0  2.0   O2 Saturation % 87.0  - - 99.2  93  95  93  97  100  100  85  100  100     Details       Multiple  values from one day are sorted in reverse-chronological order         Capillary Blood Glucose: Lab Results  Component Value Date   GLUCAP 112 (H) 09/25/2023   GLUCAP 132 (H) 09/25/2023   GLUCAP 111 (H) 09/25/2023   GLUCAP 131 (H) 09/24/2023   GLUCAP 138 (H) 09/24/2023     Exercise Target Goals: Exercise Program Goal: Individual exercise prescription set using results from initial 6 min walk test and THRR while considering  patient's activity barriers and safety.   Exercise Prescription Goal: Starting with aerobic activity 30 plus minutes a day, 3 days per week for initial exercise prescription. Provide home exercise prescription and guidelines that participant acknowledges understanding prior to discharge.  Activity Barriers & Risk Stratification:  Activity Barriers & Cardiac Risk Stratification - 10/29/23 1411       Activity Barriers & Cardiac Risk Stratification   Activity Barriers None    Cardiac Risk Stratification High             6 Minute Walk:  6 Minute Walk     Row Name 10/29/23 1408         6 Minute Walk   Phase Initial     Distance 1010 feet     Walk Time 6 minutes     # of Rest Breaks 0     MPH 1.91     METS 2.61     RPE 7     VO2 Peak 9.13     Symptoms No     Resting HR 79 bpm     Resting BP 126/74     Resting Oxygen Saturation  98 %     Exercise Oxygen Saturation  during 6 min walk 98 %     Max Ex. HR 94 bpm     Max Ex. BP 146/64     2 Minute Post BP 116/64              Oxygen Initial Assessment:   Oxygen Re-Evaluation:   Oxygen Discharge (Final Oxygen Re-Evaluation):   Initial Exercise Prescription:  Initial Exercise Prescription - 10/29/23 1400  Date of Initial Exercise RX and Referring Provider   Date 10/29/23    Referring Provider Henderickson, Viviann Spare MD   Primary Cardiologist: Dr. Donato Schultz     Oxygen   Maintain Oxygen Saturation 88% or higher      Treadmill   MPH 2    Grade 1    Minutes 15    METs  2.81      REL-XR   Level 3    Speed 50    Minutes 15    METs 2.8      Prescription Details   Frequency (times per week) 3    Duration Progress to 30 minutes of continuous aerobic without signs/symptoms of physical distress      Intensity   THRR 40-80% of Max Heartrate 108-137    Ratings of Perceived Exertion 11-13    Perceived Dyspnea 0-4      Progression   Progression Continue to progress workloads to maintain intensity without signs/symptoms of physical distress.      Resistance Training   Training Prescription Yes    Weight 5 lb    Reps 10-15             Perform Capillary Blood Glucose checks as needed.  Exercise Prescription Changes:   Exercise Prescription Changes     Row Name 10/29/23 1400 11/05/23 1200 11/07/23 1100 12/01/23 1200 12/17/23 1300     Response to Exercise   Blood Pressure (Admit) 126/74 96/64 -- 110/60 90/60   Blood Pressure (Exercise) 146/64 142/70 -- -- --   Blood Pressure (Exit) 116/64 122/62 -- 104/56 108/62   Heart Rate (Admit) 79 bpm 73 bpm -- 72 bpm 77 bpm   Heart Rate (Exercise) 94 bpm 93 bpm -- 98 bpm 86 bpm   Heart Rate (Exit) 80 bpm 79 bpm -- 80 bpm 68 bpm   Oxygen Saturation (Admit) 98 % -- -- -- --   Oxygen Saturation (Exercise) 98 % -- -- -- --   Rating of Perceived Exertion (Exercise) 7 11 -- 12 12   Symptoms none -- -- -- --   Comments walk test results -- -- -- --   Duration -- Continue with 30 min of aerobic exercise without signs/symptoms of physical distress. -- Continue with 30 min of aerobic exercise without signs/symptoms of physical distress. Continue with 30 min of aerobic exercise without signs/symptoms of physical distress.   Intensity -- THRR unchanged -- THRR unchanged THRR unchanged     Progression   Progression -- Continue to progress workloads to maintain intensity without signs/symptoms of physical distress. -- Continue to progress workloads to maintain intensity without signs/symptoms of physical distress.  Continue to progress workloads to maintain intensity without signs/symptoms of physical distress.     Resistance Training   Training Prescription -- Yes -- Yes Yes   Weight -- 5 -- 8 8   Reps -- 10-15 -- 10-15 10-15     Treadmill   MPH -- 2.5 -- 3 2.8   Grade -- 2 -- 3 2   Minutes -- 15 -- 15 15   METs -- 3.6 -- 4.54 3.91     REL-XR   Level -- 3 -- 5 4   Speed -- 50 -- 63 69   Minutes -- 15 -- 15 15   METs -- 4.9 -- 5.6 5.3     Home Exercise Plan   Plans to continue exercise at -- -- Home (comment)  treadmill, weights, bands Home (comment) Home (comment)  Frequency -- -- Add 3 additional days to program exercise sessions. Add 3 additional days to program exercise sessions. Add 3 additional days to program exercise sessions.   Initial Home Exercises Provided -- -- 11/07/23 -- --            Exercise Comments:   Exercise Comments     Row Name 11/03/23 1116           Exercise Comments First full day of exercise!  Patient was oriented to gym and equipment including functions, settings, policies, and procedures.  Patient's individual exercise prescription and treatment plan were reviewed.  All starting workloads were established based on the results of the 6 minute walk test done at initial orientation visit.  The plan for exercise progression was also introduced and progression will be customized based on patient's performance and goals.                Exercise Goals and Review:   Exercise Goals     Row Name 10/29/23 1413             Exercise Goals   Increase Physical Activity Yes       Intervention Provide advice, education, support and counseling about physical activity/exercise needs.;Develop an individualized exercise prescription for aerobic and resistive training based on initial evaluation findings, risk stratification, comorbidities and participant's personal goals.       Expected Outcomes Short Term: Attend rehab on a regular basis to increase amount  of physical activity.;Long Term: Add in home exercise to make exercise part of routine and to increase amount of physical activity.;Long Term: Exercising regularly at least 3-5 days a week.       Increase Strength and Stamina Yes       Intervention Provide advice, education, support and counseling about physical activity/exercise needs.;Develop an individualized exercise prescription for aerobic and resistive training based on initial evaluation findings, risk stratification, comorbidities and participant's personal goals.       Expected Outcomes Short Term: Perform resistance training exercises routinely during rehab and add in resistance training at home;Short Term: Increase workloads from initial exercise prescription for resistance, speed, and METs.;Long Term: Improve cardiorespiratory fitness, muscular endurance and strength as measured by increased METs and functional capacity ( )       Able to understand and use rate of perceived exertion (RPE) scale Yes       Intervention Provide education and explanation on how to use RPE scale       Expected Outcomes Long Term:  Able to use RPE to guide intensity level when exercising independently;Short Term: Able to use RPE daily in rehab to express subjective intensity level       Able to understand and use Dyspnea scale Yes       Intervention Provide education and explanation on how to use Dyspnea scale       Expected Outcomes Long Term: Able to use Dyspnea scale to guide intensity level when exercising independently;Short Term: Able to use Dyspnea scale daily in rehab to express subjective sense of shortness of breath during exertion       Knowledge and understanding of Target Heart Rate Range (THRR) Yes       Intervention Provide education and explanation of THRR including how the numbers were predicted and where they are located for reference       Expected Outcomes Short Term: Able to state/look up THRR;Long Term: Able to use THRR to govern intensity  when exercising independently;Short Term: Able to  use daily as guideline for intensity in rehab       Able to check pulse independently Yes       Intervention Review the importance of being able to check your own pulse for safety during independent exercise;Provide education and demonstration on how to check pulse in carotid and radial arteries.       Expected Outcomes Short Term: Able to explain why pulse checking is important during independent exercise;Long Term: Able to check pulse independently and accurately       Understanding of Exercise Prescription Yes       Intervention Provide education, explanation, and written materials on patient's individual exercise prescription       Expected Outcomes Short Term: Able to explain program exercise prescription;Long Term: Able to explain home exercise prescription to exercise independently                Exercise Goals Re-Evaluation :  Exercise Goals Re-Evaluation     Row Name 11/03/23 1116 11/07/23 0819 11/07/23 1127 12/03/23 1123       Exercise Goal Re-Evaluation   Exercise Goals Review Understanding of Exercise Prescription;Knowledge and understanding of Target Heart Rate Range (THRR);Able to understand and use Dyspnea scale;Able to understand and use rate of perceived exertion (RPE) scale Increase Physical Activity;Increase Strength and Stamina;Understanding of Exercise Prescription Increase Physical Activity;Increase Strength and Stamina;Able to understand and use rate of perceived exertion (RPE) scale;Able to understand and use Dyspnea scale;Knowledge and understanding of Target Heart Rate Range (THRR);Able to check pulse independently;Understanding of Exercise Prescription Increase Physical Activity;Increase Strength and Stamina;Able to check pulse independently    Comments Reviewed RPE and dyspnea scale, THR and program prescription with pt today.  Pt voiced understanding and was given a copy of goals to take home. Jesse Coleman has just started  rehab this week and is doing well. He is tolerating exercise and has increased his speed and grade on the treadmill already. He is at 2.5 speed with a 2 grade. Will continue to monitor and progress as able. Jesse Coleman is off to a good start in rehab.  He has already started to use his treadmill at home some. Reviewed home exercise with pt today.  Pt plans to walk on treadmill at home for exercise.  He also has weights and a band at home to use for resistance training. Reviewed THR, pulse, RPE, sign and symptoms, pulse oximetery and when to call 911 or MD.  Also discussed weather considerations and indoor options.  Pt voiced understanding. Jesse Coleman is doing well in rehab.  He is exercising on his off days using treadmill, weights, and bands.  He is now doing two miles around block each day with his dog.  Before his surgery his he could not even make it around the block without stopping.  He is feeling much better.    Expected Outcomes Short: Use RPE daily to regulate intensity.  Long: Follow program prescription in THR. continue to attend rehab Short: Start to add in more exercise at home on off days Long: Continue to build strength and stamina SHort: Conitnue to attend rehab to push self. Long: Continue to improve stamina and push              Discharge Exercise Prescription (Final Exercise Prescription Changes):  Exercise Prescription Changes - 12/17/23 1300       Response to Exercise   Blood Pressure (Admit) 90/60    Blood Pressure (Exit) 108/62    Heart Rate (Admit) 77 bpm  Heart Rate (Exercise) 86 bpm    Heart Rate (Exit) 68 bpm    Rating of Perceived Exertion (Exercise) 12    Duration Continue with 30 min of aerobic exercise without signs/symptoms of physical distress.    Intensity THRR unchanged      Progression   Progression Continue to progress workloads to maintain intensity without signs/symptoms of physical distress.      Resistance Training   Training Prescription Yes    Weight 8     Reps 10-15      Treadmill   MPH 2.8    Grade 2    Minutes 15    METs 3.91      REL-XR   Level 4    Speed 69    Minutes 15    METs 5.3      Home Exercise Plan   Plans to continue exercise at Home (comment)    Frequency Add 3 additional days to program exercise sessions.             Nutrition:  Target Goals: Understanding of nutrition guidelines, daily intake of sodium 1500mg , cholesterol 200mg , calories 30% from fat and 7% or less from saturated fats, daily to have 5 or more servings of fruits and vegetables.  Biometrics:  Pre Biometrics - 10/29/23 1413       Pre Biometrics   Height 5\' 10"  (1.778 m)    Weight 184 lb 12.8 oz (83.8 kg)    Waist Circumference 41 inches    Hip Circumference 37 inches    Waist to Hip Ratio 1.11 %    BMI (Calculated) 26.52    Grip Strength 40.1 kg    Single Leg Stand 18.1 seconds              Nutrition Therapy Plan and Nutrition Goals:  Nutrition Therapy & Goals - 10/29/23 1414       Intervention Plan   Intervention Prescribe, educate and counsel regarding individualized specific dietary modifications aiming towards targeted core components such as weight, hypertension, lipid management, diabetes, heart failure and other comorbidities.;Nutrition handout(s) given to patient.    Expected Outcomes Short Term Goal: Understand basic principles of dietary content, such as calories, fat, sodium, cholesterol and nutrients.;Long Term Goal: Adherence to prescribed nutrition plan.             Nutrition Assessments:  MEDIFICTS Score Key: >=70 Need to make dietary changes  40-70 Heart Healthy Diet <= 40 Therapeutic Level Cholesterol Diet  Flowsheet Row CARDIAC VIRTUAL BASED CARE from 10/23/2023 in [facility] CARDIAC REHABILITATION  Picture Your Plate Total Score on Admission 62      Picture Your Plate Scores: <40 Unhealthy dietary pattern with much room for improvement. 41-50 Dietary pattern unlikely to meet recommendations  for good health and room for improvement. 51-60 More healthful dietary pattern, with some room for improvement.  >60 Healthy dietary pattern, although there may be some specific behaviors that could be improved.    Nutrition Goals Re-Evaluation:  Nutrition Goals Re-Evaluation     Row Name 11/07/23 1132 12/03/23 1127           Goals   Nutrition Goal Heart Healthy diet Short: Continue to work on reading food labels Long: Continue to focus on heart healthy eating.      Comment Jesse Coleman adjusted his diet 12 years ago and used to be 275 lb but has worked his way down with adjusting his diet.  He avoids alcohol and red meat.  He eats  a good variety of fruits and vegetables.  He no longer uses salt except for what it already in there.  He has started to read food labels and they cleared the pantry.  He was surprised at how much stuff is in food and juices.  He has lived by his 6 yo grandmother's theory to eat as close to the branch as possible. Jesse Coleman is doing well in rehab.  He has been working on his diet.  He is eating more salmon and fruits and vegetables.  He has been reading more food labels.  He works on staying on [employer].  He likes strawberries, blueberries and avocado.  He is trying to get enough protein even though he is staying away from red meat more now.      Expected Outcome Short: Continue to work on reading food labels Long: Continue to focus on heart healthy eating. Short: Continue to shop perimeter of store Long: Continue to add in more variety.               Nutrition Goals Discharge (Final Nutrition Goals Re-Evaluation):  Nutrition Goals Re-Evaluation - 12/03/23 1127       Goals   Nutrition Goal Short: Continue to work on reading food labels Long: Continue to focus on heart healthy eating.    Comment Jesse Coleman is doing well in rehab.  He has been working on his diet.  He is eating more salmon and fruits and vegetables.  He has been reading more food labels.  He  works on staying on [employer].  He likes strawberries, blueberries and avocado.  He is trying to get enough protein even though he is staying away from red meat more now.    Expected Outcome Short: Continue to shop perimeter of store Long: Continue to add in more variety.             Psychosocial: Target Goals: Acknowledge presence or absence of significant depression and/or stress, maximize coping skills, provide positive support system. Participant is able to verbalize types and ability to use techniques and skills needed for reducing stress and depression.  Initial Review & Psychosocial Screening:  Initial Psych Review & Screening - 10/23/23 1522       Initial Review   Current issues with Current Psychotropic Meds;Current Anxiety/Panic;Current Sleep Concerns;Current Stress Concerns    Source of Stress Concerns Family      Family Dynamics   Good Support System? Yes      Barriers   Psychosocial barriers to participate in program The patient should benefit from training in stress management and relaxation.      Screening Interventions   Interventions Encouraged to exercise;To provide support and resources with identified psychosocial needs;Provide feedback about the scores to participant    Expected Outcomes Short Term goal: Utilizing psychosocial counselor, staff and physician to assist with identification of specific Stressors or current issues interfering with healing process. Setting desired goal for each stressor or current issue identified.;Long Term Goal: Stressors or current issues are controlled or eliminated.;Short Term goal: Identification and review with participant of any Quality of Life or Depression concerns found by scoring the questionnaire.;Long Term goal: The participant improves quality of Life and PHQ9 Scores as seen by post scores and/or verbalization of changes             Quality of Life Scores:  Quality of Life - 10/23/23 1443        Quality of Life   Select  Quality of Life      Quality of Life Scores   Health/Function Pre 17.57 %    Socioeconomic Pre 16.31 %    Psych/Spiritual Pre 14.86 %    Family Pre 21.6 %    GLOBAL Pre 17.31 %            Scores of 19 and below usually indicate a poorer quality of life in these areas.  A difference of  2-3 points is a clinically meaningful difference.  A difference of 2-3 points in the total score of the Quality of Life Index has been associated with significant improvement in overall quality of life, self-image, physical symptoms, and general health in studies assessing change in quality of life.  PHQ-9: Review Flowsheet       10/29/2023 08/08/2020  Depression screen PHQ 2/9  Decreased Interest 0 0  Down, Depressed, Hopeless 0 0  PHQ - 2 Score 0 0  Altered sleeping 2 -  Tired, decreased energy 1 -  Change in appetite 2 -  Feeling bad or failure about yourself  2 -  Trouble concentrating 0 -  Moving slowly or fidgety/restless 0 -  Suicidal thoughts 0 -  PHQ-9 Score 7 -  Difficult doing work/chores Not difficult at all -   Interpretation of Total Score  Total Score Depression Severity:  1-4 = Minimal depression, 5-9 = Mild depression, 10-14 = Moderate depression, 15-19 = Moderately severe depression, 20-27 = Severe depression   Psychosocial Evaluation and Intervention:  Psychosocial Evaluation - 10/23/23 1523       Psychosocial Evaluation & Interventions   Interventions Stress management education;Relaxation education;Encouraged to exercise with the program and follow exercise prescription    Comments Patient was referred to CR with CABGx4. He denies any depressions but says since his surgery he does have anxiety about his heath and the future. He also says he has trouble sleeping since his surgery due to the anxiety and surgical site discomfort. He is taking Lorazepam for sleep which helps some. He was started on Cymbalta a few months ago to help manage the stress  of his wife and son's cancer diagnosis. He lives with his wife and adult son who are his support people. 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 Both his wife and son were diagnosed with kideny cancer a few months ago and this and his his own health has been a stressor in his life. He has returned to work some. He was exercising some prior to his surgery and enjoys riding his motorcycle. His goals for the program are to keep his weight stable. He and his wife changed their diet after she was diagnosed with cancer and he has lost 30 lbs and he wants to keep this weight off. He also wants to build his energy back up and improve his muscle tone. He is looking forward to pariticpating in the program. No barriers identified for him to complete the program.    Expected Outcomes Short Term: Start the program and attend consistently. Long Term: Complete the program meeting personal goals.    Continue Psychosocial Services  Follow up required by staff             Psychosocial Re-Evaluation:  Psychosocial Re-Evaluation     Row Name 11/07/23 1129 12/03/23 1130           Psychosocial Re-Evaluation   Current issues with Current Stress Concerns;Current Sleep Concerns Current Stress Concerns;Current  Sleep Concerns      Comments Jesse Coleman is doing well in rehab.  He does have the family stressor of his 73 yo son living at home without a job.  He would like to fix it, but tries not to dwell on it too much.  He will usually start his day at 6am with the dogs.  He gets up a couple times a night to go to bathroom and will also wake up on occiassion too.  He will usually go to chair to nap and then back to bed.  He does take an afternoon nap to help.  He used to weigh 275 lb 12 years ago but fixed his diet to help lose weight and now feels confident in his body. He is also dealing with a recent diagnosis of kidney cancer in his wife.  They are also still adjusting to the new  puppy in the house. Jesse Coleman is doing well in rehab. He has learned to cope with his stresssors.  He looks to see if it is life threatning or need immediate attention.  If not, he takes a breath and does what he can.  He continues to walk with dog each day. He continues to worry about his son and his health.  His son in dealing with kidney cancer.  He wants to be supportive but not take on extra burden.  He is still doing well with his new puppy.  He gets his 4 hrs each night and then 2 hours and then a nap in the afternoon.  He is getting enough, just not all at one time.      Expected Outcomes Short: Continue to use naps to get enough sleep Long; Conitnue to cope with son. Short: Continue to stay positive Long: Continue cope with son.      Interventions Stress management education;Encouraged to attend Cardiac Rehabilitation for the exercise Stress management education;Encouraged to attend Cardiac Rehabilitation for the exercise      Continue Psychosocial Services  Follow up required by staff Follow up required by staff        Initial Review   Source of Stress Concerns Family --               Psychosocial Discharge (Final Psychosocial Re-Evaluation):  Psychosocial Re-Evaluation - 12/03/23 1130       Psychosocial Re-Evaluation   Current issues with Current Stress Concerns;Current Sleep Concerns    Comments Jesse Coleman is doing well in rehab. He has learned to cope with his stresssors.  He looks to see if it is life threatning or need immediate attention.  If not, he takes a breath and does what he can.  He continues to walk with dog each day. He continues to worry about his son and his health.  His son in dealing with kidney cancer.  He wants to be supportive but not take on extra burden.  He is still doing well with his new puppy.  He gets his 4 hrs each night and then 2 hours and then a nap in the afternoon.  He is getting enough, just not all at one time.    Expected Outcomes Short: Continue to stay  positive Long: Continue cope with son.    Interventions Stress management education;Encouraged to attend Cardiac Rehabilitation for the exercise    Continue Psychosocial Services  Follow up required by staff             Vocational Rehabilitation: Provide vocational rehab assistance to qualifying candidates.  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             Education: Education Goals: Education classes will be provided on a weekly basis, covering required topics. Participant will state understanding/return demonstration of topics presented.  Learning Barriers/Preferences:  Learning Barriers/Preferences - 10/23/23 1532       Learning Barriers/Preferences   Learning Barriers None    Learning Preferences Skilled Demonstration             Education Topics: Hypertension, Hypertension Reduction -Define heart disease and high blood pressure. Discus how high blood pressure affects the body and ways to reduce high blood pressure.   Exercise and Your Heart -Discuss why it is important to exercise, the FITT principles of exercise, normal and abnormal responses to exercise, and how to exercise safely. Flowsheet Row CARDIAC REHAB PHASE II EXERCISE from 12/17/2023 in [place] CARDIAC REHABILITATION  Date 11/12/23  Educator [facility]  Instruction Review Code 1- Verbalizes Understanding       Angina -Discuss definition of angina, causes of angina, treatment of angina, and how to decrease risk of having angina.   Cardiac Medications -Review what the following cardiac medications are used for, how they affect the body, and side effects that may occur when taking the medications.  Medications include Aspirin, Beta  blockers, calcium channel blockers, ACE Inhibitors, angiotensin receptor blockers, diuretics, digoxin, and antihyperlipidemics.   Congestive Heart Failure -Discuss the definition of CHF, how to live with CHF, the signs and symptoms of CHF, and how keep track of weight and sodium intake. Flowsheet Row CARDIAC REHAB PHASE II EXERCISE from 12/17/2023 in [place] CARDIAC REHABILITATION  Date 12/17/23  Educator HB  Instruction Review Code 1- Verbalizes Understanding       Heart Disease and Intimacy -Discus the effect sexual activity has on the heart, how changes occur during intimacy as we age, and safety during sexual activity. Flowsheet Row CARDIAC REHAB PHASE II EXERCISE from 12/17/2023 in [place] CARDIAC REHABILITATION  Date 11/19/23  Educator jh  Instruction Review Code 1- Verbalizes Understanding       Smoking Cessation / COPD -Discuss different methods to quit smoking, the health benefits of quitting smoking, and the definition of COPD.   Nutrition I: Fats -Discuss the types of cholesterol, what cholesterol does to the heart, and how cholesterol levels can be controlled. Flowsheet Row CARDIAC REHAB PHASE II EXERCISE from 12/17/2023 in [place] CARDIAC REHABILITATION  Date 12/03/23  Educator jh  Instruction Review Code 1- Verbalizes Understanding       Nutrition II: Labels -Discuss the different components of food labels and how to read food label   Heart Parts/Heart Disease and PAD -Discuss the anatomy of the heart, the pathway of blood circulation through the heart, and these are affected by heart disease.   Stress I: Signs and Symptoms -Discuss the causes of stress, how stress may lead to anxiety and depression, and ways to limit stress. Flowsheet Row CARDIAC REHAB PHASE II EXERCISE from 12/17/2023 in [place] CARDIAC REHABILITATION  Date 11/05/23  Educator jh  Instruction Review Code 1- Verbalizes Understanding       Stress II: Relaxation -Discuss  different types of relaxation techniques to limit stress. Flowsheet Row  CARDIAC REHAB PHASE II EXERCISE from 12/17/2023 in [facility] [facility] CARDIAC REHABILITATION  Date 11/26/23  Educator jh  Instruction Review Code 1- Verbalizes Understanding       Warning Signs of Stroke / TIA -Discuss definition of a stroke, what the signs and symptoms are of a stroke, and how to identify when someone is having stroke.   Knowledge Questionnaire Score:  Knowledge Questionnaire Score - 10/23/23 1442       Knowledge Questionnaire Score   Pre Score 22/26             Core Components/Risk Factors/Patient Goals at Admission:  Personal Goals and Risk Factors at Admission - 10/29/23 1414       Core Components/Risk Factors/Patient Goals on Admission    Weight Management Yes;Weight Maintenance    Intervention Weight Management: Develop a combined nutrition and exercise program designed to reach desired caloric intake, while maintaining appropriate intake of nutrient and fiber, sodium and fats, and appropriate energy expenditure required for the weight goal.;Weight Management: Provide education and appropriate resources to help participant work on and attain dietary goals.    Admit Weight 184 lb 12.8 oz (83.8 kg)    Goal Weight: Short Term 184 lb (83.5 kg)    Goal Weight: Long Term 184 lb (83.5 kg)    Expected Outcomes Short Term: Continue to assess and modify interventions until short term weight is achieved;Long Term: Adherence to nutrition and physical activity/exercise program aimed toward attainment of established weight goal;Weight Maintenance: Understanding of the daily nutrition guidelines, which includes 25-35% calories from fat, 7% or less cal from saturated fats, less than 200mg  cholesterol, less than 1.5gm of sodium, & 5 or more servings of fruits and vegetables daily    Hypertension Yes    Intervention Provide education on lifestyle modifcations including regular physical activity/exercise, weight  management, moderate sodium restriction and increased consumption of fresh fruit, vegetables, and low fat dairy, alcohol moderation, and smoking cessation.;Monitor prescription use compliance.    Expected Outcomes Short Term: Continued assessment and intervention until BP is < 140/29mm HG in hypertensive participants. < 130/11mm HG in hypertensive participants with diabetes, heart failure or chronic kidney disease.;Long Term: Maintenance of blood pressure at goal levels.    Lipids Yes    Intervention Provide education and support for participant on nutrition & aerobic/resistive exercise along with prescribed medications to achieve LDL 70mg , HDL >40mg .    Expected Outcomes Short Term: Participant states understanding of desired cholesterol values and is compliant with medications prescribed. Participant is following exercise prescription and nutrition guidelines.;Long Term: Cholesterol controlled with medications as prescribed, with individualized exercise RX and with personalized nutrition plan. Value goals: LDL < 70mg , HDL > 40 mg.             Core Components/Risk Factors/Patient Goals Review:   Goals and Risk Factor Review     Row Name 11/07/23 1135 12/03/23 1129           Core Components/Risk Factors/Patient Goals Review   Personal Goals Review Weight Management/Obesity;Hypertension;Lipids Weight Management/Obesity;Hypertension;Lipids      Review Jesse Coleman is doing well in rehab.  He now has a blood pressure cuff to monitor his pressures at home and they have been good.  His weight is down and has not weighed this little in 50 yrs.  He is pleased with his progress.  He is doing well on his medications and feeling good overall. Jesse Coleman is doing well in rehab.  His weight has been stable around 183-185 each morning.  Blood pressures have been good.  He continues to do well on his meds.  He is doing better about a healthy snack.  He has a snack after rehab and quick nap and ready for the afternoon.       Expected Outcomes Short: Continue to keep eye on blood pressures Long: Continue to monitor risk factors. Short: Continue to work on healthy eating Long; Continue to monitor risk factors.               Core Components/Risk Factors/Patient Goals at Discharge (Final Review):   Goals and Risk Factor Review - 12/03/23 1129       Core Components/Risk Factors/Patient Goals Review   Personal Goals Review Weight Management/Obesity;Hypertension;Lipids    Review Jesse Coleman is doing well in rehab.  His weight has been stable around 183-185 each morning. Blood pressures have been good.  He continues to do well on his meds.  He is doing better about a healthy snack.  He has a snack after rehab and quick nap and ready for the afternoon.    Expected Outcomes Short: Continue to work on healthy eating Long; Continue to monitor risk factors.             ITP Comments:  ITP Comments     Row Name 10/29/23 1406 11/03/23 1116 11/26/23 0830 12/24/23 0844     ITP Comments Patient attend orientation today.  Patient is attending Cardiac Rehabilitation Program.  Documentation for diagnosis can be found in [facility] admission 09/18/23 with surgery on 09/23/23.  Reviewed medical chart, RPE/RPD, gym safety, and program guidelines.  Patient was fitted to equipment they will be using during rehab.  Patient is scheduled to start exercise on Monday 11/03/23 at 1100.   Initial ITP created and sent for review and signature by Dr. Dina Rich, Medical Director for Cardiac Rehabilitation Program. First full day of exercise!  Patient was oriented to gym and equipment including functions, settings, policies, and procedures.  Patient's individual exercise prescription and treatment plan were reviewed.  All starting workloads were established based on the results of the 6 minute walk test done at initial orientation visit.  The plan for exercise progression was also introduced and progression will be customized based on patient's  performance and goals. 30 day review completed. ITP sent to Dr. Dina Rich, Medical Director of Cardiac Rehab. Continue with ITP unless changes are made by physician. 30 day review completed. ITP sent to Dr. Dina Rich, Medical Director of Cardiac Rehab. Continue with ITP unless changes are made by physician.             Comments: 30 day review

## 2023-12-26 ENCOUNTER — Encounter (HOSPITAL_COMMUNITY)
Admission: RE | Admit: 2023-12-26 | Discharge: 2023-12-26 | Disposition: A | Discharge status: Home / Self Care | Payer: Medicare Other | Source: Ambulatory Visit | Attending: Thoracic Surgery (Cardiothoracic Vascular Surgery) | Admitting: Thoracic Surgery (Cardiothoracic Vascular Surgery)

## 2023-12-26 DIAGNOSIS — Z951 Presence of aortocoronary bypass graft: Secondary | ICD-10-CM | POA: Diagnosis not present

## 2023-12-26 NOTE — Progress Notes (Signed)
 Daily Session Note  Patient Details  Name: Jesse Coleman MRN: [id number] Date of Birth: September 04, 1956 Referring Provider:   Flowsheet Row CARDIAC REHAB PHASE II ORIENTATION from 10/29/2023 in [facility] CARDIAC REHABILITATION  Referring Provider Henderickson, Viviann Spare MD  [facility] Cardiologist: Dr. Loraine Leriche Skains]       Encounter Date: 12/26/2023  Check In:  Session Check In - 12/26/23 1133       Check-In   Supervising physician immediately available to respond to emergencies See telemetry face sheet for immediately available MD    Staff Present Terrance Mass, RN;Heather Fredric Mare, BS, Exercise Physiologist;Debra Laural Benes, RN, BSN    Virtual Visit No    Medication changes reported     No    Fall or balance concerns reported    No    Tobacco Cessation No Change    Warm-up and Cool-down Performed on first and last piece of equipment    Resistance Training Performed Yes    VAD Patient? No    PAD/SET Patient? No      Pain Assessment   Currently in Pain? No/denies    Multiple Pain Sites No             Capillary Blood Glucose: No results found for this or any previous visit (from the past 24 hours).    Social History   Tobacco Use  Smoking Status Never  Smokeless Tobacco Never    Goals Met:  Independence with exercise equipment Exercise tolerated well Strength training completed today  Goals Unmet:  Not Applicable  Comments:  Pt able to follow exercise prescription today without complaint.  Will continue to monitor for progression.

## 2023-12-26 NOTE — Progress Notes (Signed)
 Daily Session Note  Patient Details  Name: Brodi Kari MRN: [id number] Date of Birth: [birth year] Referring Provider:   Flowsheet Row CARDIAC REHAB PHASE II ORIENTATION from 10/29/2023 in [facility] CARDIAC REHABILITATION  Referring Provider Henderickson, Viviann Spare MD  [facility] Cardiologist: Dr. Loraine Leriche Skains]       Encounter Date: 12/26/2023  Check In:  Session Check In - 12/26/23 1045       Check-In   Supervising physician immediately available to respond to emergencies See telemetry face sheet for immediately available MD    Location [facility]    Staff Present Ross Ludwig, BS, Exercise Physiologist;Anquinette Pierro Laural Benes, RN, Bonney Roussel, RN    Virtual Visit No    Medication changes reported     No    Fall or balance concerns reported    No    Warm-up and Cool-down Performed on first and last piece of equipment    Resistance Training Performed Yes    VAD Patient? No    PAD/SET Patient? No      Pain Assessment   Currently in Pain? No/denies    Pain Score 0-No pain    Multiple Pain Sites No             Capillary Blood Glucose: No results found for this or any previous visit (from the past 24 hours).    Social History   Tobacco Use  Smoking Status Never  Smokeless Tobacco Never    Goals Met:  Independence with exercise equipment Exercise tolerated well No report of concerns or symptoms today Strength training completed today  Goals Unmet:  Not Applicable  Comments: Pt able to follow exercise prescription today without complaint.  Will continue to monitor for progression.

## 2023-12-29 ENCOUNTER — Encounter (HOSPITAL_COMMUNITY)
Admission: RE | Admit: 2023-12-29 | Discharge: 2023-12-29 | Disposition: A | Discharge status: Home / Self Care | Payer: Medicare Other | Source: Ambulatory Visit | Attending: Thoracic Surgery (Cardiothoracic Vascular Surgery) | Admitting: Thoracic Surgery (Cardiothoracic Vascular Surgery)

## 2023-12-29 VITALS — Ht 70.0 in | Wt 187.4 lb

## 2023-12-29 DIAGNOSIS — Z951 Presence of aortocoronary bypass graft: Secondary | ICD-10-CM

## 2023-12-29 NOTE — Progress Notes (Signed)
 Daily Session Note  Patient Details  Name: Jesse Coleman MRN: [id number] Date of Birth: [birth year] Referring Provider:   Flowsheet Row CARDIAC REHAB PHASE II ORIENTATION from 10/29/2023 in [facility] CARDIAC REHABILITATION  Referring Provider Henderickson, Viviann Spare MD  [facility] Cardiologist: Dr. Loraine Leriche Skains]       Encounter Date: 12/29/2023  Check In:  Session Check In - 12/29/23 1045       Check-In   Supervising physician immediately available to respond to emergencies See telemetry face sheet for immediately available ER MD    Location [facility]    Staff Present Ross Ludwig, BS, Exercise Physiologist;Melo Stauber Laural Benes, RN, Thomos Lemons, MA, RCEP, CCRP, CCET    Virtual Visit No    Medication changes reported     No    Fall or balance concerns reported    No    Warm-up and Cool-down Performed on first and last piece of equipment    Resistance Training Performed Yes    VAD Patient? No    PAD/SET Patient? No      Pain Assessment   Currently in Pain? No/denies    Pain Score 0-No pain    Multiple Pain Sites No             Capillary Blood Glucose: No results found for this or any previous visit (from the past 24 hours).    Social History   Tobacco Use  Smoking Status Never  Smokeless Tobacco Never    Goals Met:  Independence with exercise equipment Exercise tolerated well No report of concerns or symptoms today Strength training completed today  Goals Unmet:  Not Applicable  Comments: Pt able to follow exercise prescription today without complaint.  Will continue to monitor for progression.

## 2023-12-29 NOTE — Patient Instructions (Signed)
 Discharge Patient Instructions  Patient Details  Name: Jesse Coleman MRN: [id number] Date of Birth: [birth year] Referring Provider:  Adrian Prince, MD   Number of Visits: 36   Reason for Discharge:  Patient reached a stable level of exercise. Patient independent in their exercise. Patient has met program and personal goals.  Smoking History:  Social History   Tobacco Use  Smoking Status Never  Smokeless Tobacco Never    Diagnosis:  S/P CABG x 4  Initial Exercise Prescription:  Initial Exercise Prescription - 10/29/23 1400       Date of Initial Exercise RX and Referring Provider   Date 10/29/23    Referring Provider Henderickson, Viviann Spare MD   Primary Cardiologist: Dr. Donato Schultz     Oxygen   Maintain Oxygen Saturation 88% or higher      Treadmill   MPH 2    Grade 1    Minutes 15    METs 2.81      REL-XR   Level 3    Speed 50    Minutes 15    METs 2.8      Prescription Details   Frequency (times per week) 3    Duration Progress to 30 minutes of continuous aerobic without signs/symptoms of physical distress      Intensity   THRR 40-80% of Max Heartrate 108-137    Ratings of Perceived Exertion 11-13    Perceived Dyspnea 0-4      Progression   Progression Continue to progress workloads to maintain intensity without signs/symptoms of physical distress.      Resistance Training   Training Prescription Yes    Weight 5 lb    Reps 10-15             Discharge Exercise Prescription (Final Exercise Prescription Changes):  Exercise Prescription Changes - 12/17/23 1300       Response to Exercise   Blood Pressure (Admit) 90/60    Blood Pressure (Exit) 108/62    Heart Rate (Admit) 77 bpm    Heart Rate (Exercise) 86 bpm    Heart Rate (Exit) 68 bpm    Rating of Perceived Exertion (Exercise) 12    Duration Continue with 30 min of aerobic exercise without signs/symptoms of physical distress.    Intensity THRR unchanged      Progression   Progression  Continue to progress workloads to maintain intensity without signs/symptoms of physical distress.      Resistance Training   Training Prescription Yes    Weight 8    Reps 10-15      Treadmill   MPH 2.8    Grade 2    Minutes 15    METs 3.91      REL-XR   Level 4    Speed 69    Minutes 15    METs 5.3      Home Exercise Plan   Plans to continue exercise at Home (comment)    Frequency Add 3 additional days to program exercise sessions.             Nutrition & Weight - Outcomes:  Pre Biometrics - 10/29/23 1413       Pre Biometrics   Height 5\' 10"  (1.778 m)    Weight 83.8 kg    Waist Circumference 41 inches    Hip Circumference 37 inches    Waist to Hip Ratio 1.11 %    BMI (Calculated) 26.52    Grip Strength 40.1 kg    Single Leg  Stand 18.1 seconds             Post Biometrics - 12/29/23 1117        Post  Biometrics   Height 5\' 10"  (1.778 m)    Weight 85 kg    Waist Circumference 41 inches    Hip Circumference 37 inches    Waist to Hip Ratio 1.11 %    BMI (Calculated) 26.89    Grip Strength 40.2 kg    Single Leg Stand 60 seconds              Goals reviewed with patient; copy given to patient.

## 2023-12-31 ENCOUNTER — Other Ambulatory Visit: Payer: Self-pay | Admitting: Physician Assistant

## 2023-12-31 ENCOUNTER — Encounter (HOSPITAL_COMMUNITY)
Admission: RE | Admit: 2023-12-31 | Discharge: 2023-12-31 | Disposition: A | Discharge status: Home / Self Care | Payer: Medicare Other | Source: Ambulatory Visit | Attending: Thoracic Surgery (Cardiothoracic Vascular Surgery) | Admitting: Thoracic Surgery (Cardiothoracic Vascular Surgery)

## 2023-12-31 DIAGNOSIS — Z951 Presence of aortocoronary bypass graft: Secondary | ICD-10-CM | POA: Diagnosis present

## 2023-12-31 NOTE — Progress Notes (Signed)
 Daily Session Note  Patient Details  Name: Jesse Coleman MRN: [id number] Date of Birth: 07-13-1956 Referring Provider:   Flowsheet Row CARDIAC REHAB PHASE II ORIENTATION from 10/29/2023 in [facility] CARDIAC REHABILITATION  Referring Provider Henderickson, Viviann Spare MD  [facility] Cardiologist: Dr. Loraine Leriche Skains]       Encounter Date: 12/31/2023  Check In:  Session Check In - 12/31/23 1029       Check-In   Supervising physician immediately available to respond to emergencies See telemetry face sheet for immediately available MD    Location [facility]    Staff Present Ross Ludwig, BS, Exercise Physiologist;Madden Garron Roseanne Reno, BSN, RN, Sherlyn Hay, MA, RCEP, CCRP, CCET    Virtual Visit No    Medication changes reported     No    Fall or balance concerns reported    No    Tobacco Cessation No Change    Warm-up and Cool-down Performed on first and last piece of equipment    Resistance Training Performed Yes    VAD Patient? No    PAD/SET Patient? No      Pain Assessment   Currently in Pain? No/denies             Capillary Blood Glucose: No results found for this or any previous visit (from the past 24 hours).    Social History   Tobacco Use  Smoking Status Never  Smokeless Tobacco Never    Goals Met:  Independence with exercise equipment Exercise tolerated well No report of concerns or symptoms today Strength training completed today  Goals Unmet:  Not Applicable  Comments: Pt able to follow exercise prescription today without complaint.  Will continue to monitor for progression.

## 2024-01-02 ENCOUNTER — Encounter (HOSPITAL_COMMUNITY)
Admission: RE | Admit: 2024-01-02 | Discharge: 2024-01-02 | Disposition: A | Discharge status: Home / Self Care | Payer: Medicare Other | Source: Ambulatory Visit | Attending: Thoracic Surgery (Cardiothoracic Vascular Surgery) | Admitting: Thoracic Surgery (Cardiothoracic Vascular Surgery)

## 2024-01-02 DIAGNOSIS — Z951 Presence of aortocoronary bypass graft: Secondary | ICD-10-CM | POA: Diagnosis not present

## 2024-01-02 NOTE — Progress Notes (Signed)
 Daily Session Note  Patient Details  Name: Jesse Coleman MRN: [id number] Date of Birth: 07-09-1956 Referring Provider:   Flowsheet Row CARDIAC REHAB PHASE II ORIENTATION from 10/29/2023 in [facility] CARDIAC REHABILITATION  Referring Provider Henderickson, Viviann Spare MD  [facility] Cardiologist: Dr. Loraine Leriche Skains]       Encounter Date: 01/02/2024  Check In:  Session Check In - 01/02/24 1045       Check-In   Supervising physician immediately available to respond to emergencies See telemetry face sheet for immediately available MD    Location [facility]    Staff Present Terrance Mass, RN;Brooke Elwyn Reach, RN;Debra Laural Benes, RN, BSN    Virtual Visit No    Medication changes reported     No    Fall or balance concerns reported    No    Warm-up and Cool-down Performed on first and last piece of equipment    Resistance Training Performed Yes    VAD Patient? No    PAD/SET Patient? No      Pain Assessment   Currently in Pain? No/denies    Multiple Pain Sites No             Capillary Blood Glucose: No results found for this or any previous visit (from the past 24 hours).    Social History   Tobacco Use  Smoking Status Never  Smokeless Tobacco Never    Goals Met:  Independence with exercise equipment Exercise tolerated well Strength training completed today  Goals Unmet:  Not Applicable  Comments: Pt able to follow exercise prescription today without complaint.  Will continue to monitor for progression.

## 2024-01-05 ENCOUNTER — Encounter (HOSPITAL_COMMUNITY): Payer: Medicare Other

## 2024-01-06 NOTE — Progress Notes (Signed)
 Discharge Progress Report  Patient Details  Name: Jesse Coleman MRN: [id number] Date of Birth: 08-Jan-1956 Referring Provider:   Flowsheet Row CARDIAC REHAB PHASE II ORIENTATION from 10/29/2023 in [facility] CARD[facility]AC REHABILITATION  Referring Provider Henderickson, Viviann Spare MD  [Primary Cardiologist: Dr. Loraine Leriche Skains]        Number of Visits: 36  Reason for Discharge:  Patient reached a stable level of exercise. Patient independent in their exercise. Patient has met program and personal goals.  Smoking History:  Social History   Tobacco Use  Smoking Status Never  Smokeless Tobacco Never    Diagnosis:  S/P CABG x 4  ADL UCSD:   Initial Exercise Prescription:  Initial Exercise Prescription - 10/29/23 1400       Date of Initial Exercise RX and Referring Provider   Date 10/29/23    Referring Provider Henderickson, Viviann Spare MD   Primary Cardiologist: Dr. Donato Schultz     Oxygen   Maintain Oxygen Saturation 88% or higher      Treadmill   MPH 2    Grade 1    Minutes 15    METs 2.81      REL-XR   Level 3    Speed 50    Minutes 15    METs 2.8      Prescription Details   Frequency (times per week) 3    Duration Progress to 30 minutes of continuous aerobic without signs/symptoms of physical distress      Intensity   THRR 40-80% of Max Heartrate 108-137    Ratings of Perceived Exertion 11-13    Perceived Dyspnea 0-4      Progression   Progression Continue to progress workloads to maintain intensity without signs/symptoms of physical distress.      Resistance Training   Training Prescription Yes    Weight 5 lb    Reps 10-15             Discharge Exercise Prescription (Final Exercise Prescription Changes):  Exercise Prescription Changes - 12/17/23 1300       Response to Exercise   Blood Pressure (Admit) 90/60    Blood Pressure (Exit) 108/62    Heart Rate (Admit) 77 bpm    Heart Rate (Exercise) 86 bpm    Heart Rate (Exit) 68 bpm    Rating of Perceived  Exertion (Exercise) 12    Duration Continue with 30 min of aerobic exercise without signs/symptoms of physical distress.    Intensity THRR unchanged      Progression   Progression Continue to progress workloads to maintain intensity without signs/symptoms of physical distress.      Resistance Training   Training Prescription Yes    Weight 8    Reps 10-15      Treadmill   MPH 2.8    Grade 2    Minutes 15    METs 3.91      REL-XR   Level 4    Speed 69    Minutes 15    METs 5.3      Home Exercise Plan   Plans to continue exercise at Home (comment)    Frequency Add 3 additional days to program exercise sessions.             Functional Capacity:  6 Minute Walk     Row Name 10/29/23 1408 12/26/23 1115       6 Minute Walk   Phase Initial Discharge    Distance 1010 feet 1705 feet  Distance Feet Change -- 695 ft    Walk Time 6 minutes 6 minutes    # of Rest Breaks 0 0    MPH 1.91 3.23    METS 2.61 3.75    RPE 7 13    VO2 Peak 9.13 13.1    Symptoms No No    Resting HR 79 bpm 68 bpm    Resting BP 126/74 122/60    Resting Oxygen Saturation  98 % 98 %    Exercise Oxygen Saturation  during 6 min walk 98 % 98 %    Max Ex. HR 94 bpm 93 bpm    Max Ex. BP 146/64 150/60    2 Minute Post BP 116/64 136/60             Psychological, QOL, Others - Outcomes: PHQ 2/9:    01/01/2024    3:36 PM 10/29/2023    2:15 PM 08/08/2020    3:32 PM  Depression screen PHQ 2/9  Decreased Interest 1 0 0  Down, Depressed, Hopeless 0 0 0  PHQ - 2 Score 1 0 0  Altered sleeping 0 2   Tired, decreased energy 2 1   Change in appetite 0 2   Feeling bad or failure about yourself  2 2   Trouble concentrating 3 0   Moving slowly or fidgety/restless 2 0   Suicidal thoughts 2 0   PHQ-9 Score 12 7   Difficult doing work/chores  Not difficult at all     Quality of Life:  Quality of Life - 01/01/24 1535       Quality of Life   Select Quality of Life      Quality of Life Scores    Health/Function Pre 17.57 %    Health/Function Post 18.73 %    Health/Function % Change 6.6 %    Socioeconomic Pre 16.31 %    Socioeconomic Post 18.57 %    Socioeconomic % Change  13.86 %    Psych/Spiritual Pre 14.86 %    Psych/Spiritual Post 16.1 %    Psych/Spiritual % Change 8.34 %    Family Pre 21.6 %    Family Post 21.1 %    Family % Change -2.31 %    GLOBAL Pre 17.31 %    GLOBAL Post 18.66 %    GLOBAL % Change 7.8 %                       Nutrition & Weight - Outcomes:  Pre Biometrics - 10/29/23 1413       Pre Biometrics   Height 5\' 10"  (1.778 m)    Weight 83.8 kg    Waist Circumference 41 inches    Hip Circumference 37 inches    Waist to Hip Ratio 1.11 %    BMI (Calculated) 26.52    Grip Strength 40.1 kg    Single Leg Stand 18.1 seconds              Education Questionnaire Score:  Knowledge Questionnaire Score - 01/01/24 1542       Knowledge Questionnaire Score   Post Score 26/26             Goals reviewed with patient; copy given to patient.

## 2024-01-06 NOTE — Progress Notes (Signed)
 Jesse Coleman graduated today from  rehab with 36 sessions completed.  Details of the patient's exercise prescription and what He needs to do in order to continue the prescription and progress were discussed with patient.  Patient was given a copy of prescription and goals.  Patient verbalized understanding. Jesse Coleman plans to continue to exercise by walking at home.

## 2024-01-06 NOTE — Progress Notes (Signed)
 Cardiac Individual Treatment Plan  Patient Details  Name: Deshane Cotroneo MRN: [id number] Date of Birth: 09-29-1956 Referring Provider:   Flowsheet Row CARDIAC REHAB PHASE II ORIENTATION from 10/29/2023 in [facility] CARDIAC REHABILITATION  Referring Provider Henderickson, Viviann Spare MD  [Primary Cardiologist: Dr. Loraine Leriche Skains]       Initial Encounter Date:  Flowsheet Row CARDIAC REHAB PHASE II ORIENTATION from 10/29/2023 in [facility] [place] CARDIAC REHABILITATION  Date 10/29/23       Visit Diagnosis: S/P CABG x 4  Patient's Home Medications on Admission:  Current Outpatient Medications:    alfuzosin (UROXATRAL) 10 MG 24 hr tablet, Take 10 mg by mouth daily., Disp: , Rfl:    amiodarone (PACERONE) 200 MG tablet, Take 400 mg two times daily for 3 days;then take 200 mg two times daily for 7 days;then take 200 mg daily thereafater (Patient taking differently: Take 200 mg by mouth daily. Take 400 mg two times daily for 3 days;then take 200 mg two times daily for 7 days;then take 200 mg daily thereafater), Disp: 60 tablet, Rfl: 1   bromocriptine (PARLODEL) 2.5 MG tablet, Take 1.25 mg by mouth at bedtime. , Disp: , Rfl:    clopidogrel (PLAVIX) 75 MG tablet, Take 1 tablet (75 mg total) by mouth daily., Disp: 30 tablet, Rfl: 1   DULoxetine (CYMBALTA) 30 MG capsule, Take 30 mg by mouth daily., Disp: , Rfl:    ELIQUIS 5 MG TABS tablet, TAKE 1 TABLET BY MOUTH TWICE A DAY. (Patient taking differently: Take 5 mg by mouth 2 (two) times daily.), Disp: 60 tablet, Rfl: 0   EPINEPHrine 0.3 mg/0.3 mL IJ SOAJ injection, Inject 0.3 mLs (0.3 mg total) into the muscle as needed for anaphylaxis., Disp: 1 each, Rfl: 0   LORazepam (ATIVAN) 1 MG tablet, Take 1 mg by mouth every 8 (eight) hours as needed for anxiety., Disp: , Rfl:    metoprolol tartrate (LOPRESSOR) 25 MG tablet, Take 0.5 tablets (12.5 mg total) by mouth 2 (two) times daily., Disp: 30 tablet, Rfl: 1   nitroGLYCERIN (NITROSTAT) 0.4 MG SL tablet, Place 1 tablet  (0.4 mg total) under the tongue every 5 (five) minutes as needed for chest pain., Disp: 25 tablet, Rfl: 3   rosuvastatin (CRESTOR) 40 MG tablet, Take 1 tablet (40 mg total) by mouth every evening., Disp: 30 tablet, Rfl: 1   sildenafil (VIAGRA) 100 MG tablet, TAKE 1 TABLET BY MOUTH 30CMINUTES PRIOR TO 1HR BEFORE RELATIONS., Disp: , Rfl:    traMADol (ULTRAM) 50 MG tablet, Take 1 tablet (50 mg total) by mouth every 6 (six) hours as needed for severe pain (pain score 7-10)., Disp: 30 tablet, Rfl: 0   vitamin B-12 (CYANOCOBALAMIN) 100 MCG tablet, Take 100 mcg by mouth 3 (three) times a week., Disp: , Rfl:   Past Medical History: Past Medical History:  Diagnosis Date   CAD (coronary artery disease)    Erectile dysfunction    Hematospermia    Hyperlipidemia    Hypertension 2014   Overweight(278.02)    Prolactin-secreting pituitary adenoma (HCC) 1999   Medical therapy-1999    Tobacco Use: Social History   Tobacco Use  Smoking Status Never  Smokeless Tobacco Never    Labs: Review Flowsheet  More data may exist      Latest Ref Rng & Units 04/23/2019 09/19/2023 09/20/2023 09/22/2023 09/23/2023  Labs for ITP Cardiac and Pulmonary Rehab  Cholestrol 0 - 200 mg/dL - - 409  - -  LDL (calc) 0 - 99 mg/dL - -  80  - -  HDL-C >40 mg/dL - - 89  - -  Trlycerides <150 mg/dL - - 35  - -  Hemoglobin A1c 4.8 - 5.6 % 5.4  5.2  - 5.1  -  PH, Arterial 7.35 - 7.45 - - - 7.4  7.315  7.306  7.281  7.253  7.373  7.366  7.272  7.330   PCO2 arterial 32 - 48 mmHg - - - 35  37.2  40.8  44.5  48.2  37.8  35.5  50.8  44.9   Bicarbonate 20.0 - 28.0 mmol/L 16.9  - - 22.3  18.9  20.6  21.4  21.3  22.0  20.3  14.2  23.4  23.7   TCO2 22 - 32 mmol/L 18  - - - 20  22  23  23  22  23  23  23  21  23  15  25  19  25  26    Acid-base deficit 0.0 - 2.0 mmol/L 7.0  - - 1.9  7.0  6.0  6.0  6.0  3.0  5.0  12.0  4.0  2.0   O2 Saturation % 87.0  - - 99.2  93  95  93  97  100  100  85  100  100     Details       Multiple  values from one day are sorted in reverse-chronological order         Capillary Blood Glucose: Lab Results  Component Value Date   GLUCAP 112 (H) 09/25/2023   GLUCAP 132 (H) 09/25/2023   GLUCAP 111 (H) 09/25/2023   GLUCAP 131 (H) 09/24/2023   GLUCAP 138 (H) 09/24/2023     Exercise Target Goals: Exercise Program Goal: Individual exercise prescription set using results from initial 6 min walk test and THRR while considering  patient's activity barriers and safety.   Exercise Prescription Goal: Starting with aerobic activity 30 plus minutes a day, 3 days per week for initial exercise prescription. Provide home exercise prescription and guidelines that participant acknowledges understanding prior to discharge.  Activity Barriers & Risk Stratification:  Activity Barriers & Cardiac Risk Stratification - 10/29/23 1411       Activity Barriers & Cardiac Risk Stratification   Activity Barriers None    Cardiac Risk Stratification High             6 Minute Walk:  6 Minute Walk     Row Name 10/29/23 1408 12/26/23 1115       6 Minute Walk   Phase Initial Discharge    Distance 1010 feet 1705 feet    Distance Feet Change -- 695 ft    Walk Time 6 minutes 6 minutes    # of Rest Breaks 0 0    MPH 1.91 3.23    METS 2.61 3.75    RPE 7 13    VO2 Peak 9.13 13.1    Symptoms No No    Resting HR 79 bpm 68 bpm    Resting BP 126/74 122/60    Resting Oxygen Saturation  98 % 98 %    Exercise Oxygen Saturation  during 6 min walk 98 % 98 %    Max Ex. HR 94 bpm 93 bpm    Max Ex. BP 146/64 150/60    2 Minute Post BP 116/64 136/60             Oxygen Initial Assessment:   Oxygen Re-Evaluation:   Oxygen Discharge (  Final Oxygen Re-Evaluation):   Initial Exercise Prescription:  Initial Exercise Prescription - 10/29/23 1400       Date of Initial Exercise RX and Referring Provider   Date 10/29/23    Referring Provider Henderickson, Viviann Spare MD   Primary Cardiologist: Dr. Donato Schultz     Oxygen   Maintain Oxygen Saturation 88% or higher      Treadmill   MPH 2    Grade 1    Minutes 15    METs 2.81      REL-XR   Level 3    Speed 50    Minutes 15    METs 2.8      Prescription Details   Frequency (times per week) 3    Duration Progress to 30 minutes of continuous aerobic without signs/symptoms of physical distress      Intensity   THRR 40-80% of Max Heartrate 108-137    Ratings of Perceived Exertion 11-13    Perceived Dyspnea 0-4      Progression   Progression Continue to progress workloads to maintain intensity without signs/symptoms of physical distress.      Resistance Training   Training Prescription Yes    Weight 5 lb    Reps 10-15             Perform Capillary Blood Glucose checks as needed.  Exercise Prescription Changes:   Exercise Prescription Changes     Row Name 10/29/23 1400 11/05/23 1200 11/07/23 1100 12/01/23 1200 12/17/23 1300     Response to Exercise   Blood Pressure (Admit) 126/74 96/64 -- 110/60 90/60   Blood Pressure (Exercise) 146/64 142/70 -- -- --   Blood Pressure (Exit) 116/64 122/62 -- 104/56 108/62   Heart Rate (Admit) 79 bpm 73 bpm -- 72 bpm 77 bpm   Heart Rate (Exercise) 94 bpm 93 bpm -- 98 bpm 86 bpm   Heart Rate (Exit) 80 bpm 79 bpm -- 80 bpm 68 bpm   Oxygen Saturation (Admit) 98 % -- -- -- --   Oxygen Saturation (Exercise) 98 % -- -- -- --   Rating of Perceived Exertion (Exercise) 7 11 -- 12 12   Symptoms none -- -- -- --   Comments walk test results -- -- -- --   Duration -- Continue with 30 min of aerobic exercise without signs/symptoms of physical distress. -- Continue with 30 min of aerobic exercise without signs/symptoms of physical distress. Continue with 30 min of aerobic exercise without signs/symptoms of physical distress.   Intensity -- THRR unchanged -- THRR unchanged THRR unchanged     Progression   Progression -- Continue to progress workloads to maintain intensity without signs/symptoms  of physical distress. -- Continue to progress workloads to maintain intensity without signs/symptoms of physical distress. Continue to progress workloads to maintain intensity without signs/symptoms of physical distress.     Resistance Training   Training Prescription -- Yes -- Yes Yes   Weight -- 5 -- 8 8   Reps -- 10-15 -- 10-15 10-15     Treadmill   MPH -- 2.5 -- 3 2.8   Grade -- 2 -- 3 2   Minutes -- 15 -- 15 15   METs -- 3.6 -- 4.54 3.91     REL-XR   Level -- 3 -- 5 4   Speed -- 50 -- 63 69   Minutes -- 15 -- 15 15   METs -- 4.9 -- 5.6 5.3     Home Exercise Plan  Plans to continue exercise at -- -- Home (comment)  treadmill, weights, bands Home (comment) Home (comment)   Frequency -- -- Add 3 additional days to program exercise sessions. Add 3 additional days to program exercise sessions. Add 3 additional days to program exercise sessions.   Initial Home Exercises Provided -- -- 11/07/23 -- --            Exercise Comments:   Exercise Comments     Row Name 11/03/23 1116 01/06/24 0813         Exercise Comments First full day of exercise!  Patient was oriented to gym and equipment including functions, settings, policies, and procedures.  Patient's individual exercise prescription and treatment plan were reviewed.  All starting workloads were established based on the results of the 6 minute walk test done at initial orientation visit.  The plan for exercise progression was also introduced and progression will be customized based on patient's performance and goals. Ashad graduated today from  rehab with 36 sessions completed.  Details of the patient's exercise prescription and what He needs to do in order to continue the prescription and progress were discussed with patient.  Patient was given a copy of prescription and goals.  Patient verbalized understanding. Namish plans to continue to exercise by walking at home.               Exercise Goals and Review:    Exercise Goals     Row Name 10/29/23 1413             Exercise Goals   Increase Physical Activity Yes       Intervention Provide advice, education, support and counseling about physical activity/exercise needs.;Develop an individualized exercise prescription for aerobic and resistive training based on initial evaluation findings, risk stratification, comorbidities and participant's personal goals.       Expected Outcomes Short Term: Attend rehab on a regular basis to increase amount of physical activity.;Long Term: Add in home exercise to make exercise part of routine and to increase amount of physical activity.;Long Term: Exercising regularly at least 3-5 days a week.       Increase Strength and Stamina Yes       Intervention Provide advice, education, support and counseling about physical activity/exercise needs.;Develop an individualized exercise prescription for aerobic and resistive training based on initial evaluation findings, risk stratification, comorbidities and participant's personal goals.       Expected Outcomes Short Term: Perform resistance training exercises routinely during rehab and add in resistance training at home;Short Term: Increase workloads from initial exercise prescription for resistance, speed, and METs.;Long Term: Improve cardiorespiratory fitness, muscular endurance and strength as measured by increased METs and functional capacity ( )       Able to understand and use rate of perceived exertion (RPE) scale Yes       Intervention Provide education and explanation on how to use RPE scale       Expected Outcomes Long Term:  Able to use RPE to guide intensity level when exercising independently;Short Term: Able to use RPE daily in rehab to express subjective intensity level       Able to understand and use Dyspnea scale Yes       Intervention Provide education and explanation on how to use Dyspnea scale       Expected Outcomes Long Term: Able to use Dyspnea scale to  guide intensity level when exercising independently;Short Term: Able to use Dyspnea scale daily in rehab to express subjective sense  of shortness of breath during exertion       Knowledge and understanding of Target Heart Rate Range (THRR) Yes       Intervention Provide education and explanation of THRR including how the numbers were predicted and where they are located for reference       Expected Outcomes Short Term: Able to state/look up THRR;Long Term: Able to use THRR to govern intensity when exercising independently;Short Term: Able to use daily as guideline for intensity in rehab       Able to check pulse independently Yes       Intervention Review the importance of being able to check your own pulse for safety during independent exercise;Provide education and demonstration on how to check pulse in carotid and radial arteries.       Expected Outcomes Short Term: Able to explain why pulse checking is important during independent exercise;Long Term: Able to check pulse independently and accurately       Understanding of Exercise Prescription Yes       Intervention Provide education, explanation, and written materials on patient's individual exercise prescription       Expected Outcomes Short Term: Able to explain program exercise prescription;Long Term: Able to explain home exercise prescription to exercise independently                Exercise Goals Re-Evaluation :  Exercise Goals Re-Evaluation     Row Name 11/03/23 1116 11/07/23 0819 11/07/23 1127 12/03/23 1123       Exercise Goal Re-Evaluation   Exercise Goals Review Understanding of Exercise Prescription;Knowledge and understanding of Target Heart Rate Range (THRR);Able to understand and use Dyspnea scale;Able to understand and use rate of perceived exertion (RPE) scale Increase Physical Activity;Increase Strength and Stamina;Understanding of Exercise Prescription Increase Physical Activity;Increase Strength and Stamina;Able to  understand and use rate of perceived exertion (RPE) scale;Able to understand and use Dyspnea scale;Knowledge and understanding of Target Heart Rate Range (THRR);Able to check pulse independently;Understanding of Exercise Prescription Increase Physical Activity;Increase Strength and Stamina;Able to check pulse independently    Comments Reviewed RPE and dyspnea scale, THR and program prescription with pt today.  Pt voiced understanding and was given a copy of goals to take home. Kathlene November has just started rehab this week and is doing well. He is tolerating exercise and has increased his speed and grade on the treadmill already. He is at 2.5 speed with a 2 grade. Will continue to monitor and progress as able. Kathlene November is off to a good start in rehab.  He has already started to use his treadmill at home some. Reviewed home exercise with pt today.  Pt plans to walk on treadmill at home for exercise.  He also has weights and a band at home to use for resistance training. Reviewed THR, pulse, RPE, sign and symptoms, pulse oximetery and when to call 911 or MD.  Also discussed weather considerations and indoor options.  Pt voiced understanding. Kathlene November is doing well in rehab.  He is exercising on his off days using treadmill, weights, and bands.  He is now doing two miles around block each day with his dog.  Before his surgery his he could not even make it around the block without stopping.  He is feeling much better.    Expected Outcomes Short: Use RPE daily to regulate intensity.  Long: Follow program prescription in THR. continue to attend rehab Short: Start to add in more exercise at home on off days Long: Continue to  build strength and stamina SHort: Conitnue to attend rehab to push self. Long: Continue to improve stamina and push              Discharge Exercise Prescription (Final Exercise Prescription Changes):  Exercise Prescription Changes - 12/17/23 1300       Response to Exercise   Blood Pressure (Admit) 90/60     Blood Pressure (Exit) 108/62    Heart Rate (Admit) 77 bpm    Heart Rate (Exercise) 86 bpm    Heart Rate (Exit) 68 bpm    Rating of Perceived Exertion (Exercise) 12    Duration Continue with 30 min of aerobic exercise without signs/symptoms of physical distress.    Intensity THRR unchanged      Progression   Progression Continue to progress workloads to maintain intensity without signs/symptoms of physical distress.      Resistance Training   Training Prescription Yes    Weight 8    Reps 10-15      Treadmill   MPH 2.8    Grade 2    Minutes 15    METs 3.91      REL-XR   Level 4    Speed 69    Minutes 15    METs 5.3      Home Exercise Plan   Plans to continue exercise at Home (comment)    Frequency Add 3 additional days to program exercise sessions.             Nutrition:  Target Goals: Understanding of nutrition guidelines, daily intake of sodium 1500mg , cholesterol 200mg , calories 30% from fat and 7% or less from saturated fats, daily to have 5 or more servings of fruits and vegetables.  Biometrics:  Pre Biometrics - 10/29/23 1413       Pre Biometrics   Height 5\' 10"  (1.778 m)    Weight 83.8 kg    Waist Circumference 41 inches    Hip Circumference 37 inches    Waist to Hip Ratio 1.11 %    BMI (Calculated) 26.52    Grip Strength 40.1 kg    Single Leg Stand 18.1 seconds             Post Biometrics - 12/29/23 1117        Post  Biometrics   Height 5\' 10"  (1.778 m)    Weight 85 kg    Waist Circumference 41 inches    Hip Circumference 37 inches    Waist to Hip Ratio 1.11 %    BMI (Calculated) 26.89    Grip Strength 40.2 kg    Single Leg Stand 60 seconds             Nutrition Therapy Plan and Nutrition Goals:  Nutrition Therapy & Goals - 10/29/23 1414       Intervention Plan   Intervention Prescribe, educate and counsel regarding individualized specific dietary modifications aiming towards targeted core components such as weight,  hypertension, lipid management, diabetes, heart failure and other comorbidities.;Nutrition handout(s) given to patient.    Expected Outcomes Short Term Goal: Understand basic principles of dietary content, such as calories, fat, sodium, cholesterol and nutrients.;Long Term Goal: Adherence to prescribed nutrition plan.             Nutrition Assessments:  MEDIFICTS Score Key: >=70 Need to make dietary changes  40-70 Heart Healthy Diet <= 40 Therapeutic Level Cholesterol Diet  Flowsheet Row CARDIAC REHAB PHASE II EXERCISE from 12/31/2023 in [place] CARDIAC REHABILITATION  Picture Your Plate Total Score on Admission 62  Picture Your Plate Total Score on Discharge 61      Picture Your Plate Scores: <16 Unhealthy dietary pattern with much room for improvement. 41-50 Dietary pattern unlikely to meet recommendations for good health and room for improvement. 51-60 More healthful dietary pattern, with some room for improvement.  >60 Healthy dietary pattern, although there may be some specific behaviors that could be improved.    Nutrition Goals Re-Evaluation:  Nutrition Goals Re-Evaluation     Row Name 11/07/23 1132 12/03/23 1127           Goals   Nutrition Goal Heart Healthy diet Short: Continue to work on reading food labels Long: Continue to focus on heart healthy eating.      Comment Kathlene November adjusted his diet 12 years ago and used to be 275 lb but has worked his way down with adjusting his diet.  He avoids alcohol and red meat.  He eats a good variety of fruits and vegetables.  He no longer uses salt except for what it already in there.  He has started to read food labels and they cleared the pantry.  He was surprised at how much stuff is in food and juices.  He has lived by his 31 yo grandmother's theory to eat as close to the branch as possible. Kathlene November is doing well in rehab.  He has been working on his diet.  He is eating more salmon and fruits and vegetables.  He has been reading  more food labels.  He works on staying on [employer].  He likes strawberries, blueberries and avocado.  He is trying to get enough protein even though he is staying away from red meat more now.      Expected Outcome Short: Continue to work on reading food labels Long: Continue to focus on heart healthy eating. Short: Continue to shop perimeter of store Long: Continue to add in more variety.               Nutrition Goals Discharge (Final Nutrition Goals Re-Evaluation):  Nutrition Goals Re-Evaluation - 12/03/23 1127       Goals   Nutrition Goal Short: Continue to work on reading food labels Long: Continue to focus on heart healthy eating.    Comment Kathlene November is doing well in rehab.  He has been working on his diet.  He is eating more salmon and fruits and vegetables.  He has been reading more food labels.  He works on staying on [employer].  He likes strawberries, blueberries and avocado.  He is trying to get enough protein even though he is staying away from red meat more now.    Expected Outcome Short: Continue to shop perimeter of store Long: Continue to add in more variety.             Psychosocial: Target Goals: Acknowledge presence or absence of significant depression and/or stress, maximize coping skills, provide positive support system. Participant is able to verbalize types and ability to use techniques and skills needed for reducing stress and depression.  Initial Review & Psychosocial Screening:  Initial Psych Review & Screening - 10/23/23 1522       Initial Review   Current issues with Current Psychotropic Meds;Current Anxiety/Panic;Current Sleep Concerns;Current Stress Concerns    Source of Stress Concerns Family      Family Dynamics   Good Support System? Yes      Barriers  Psychosocial barriers to participate in program The patient should benefit from training in stress management and relaxation.      Screening Interventions    Interventions Encouraged to exercise;To provide support and resources with identified psychosocial needs;Provide feedback about the scores to participant    Expected Outcomes Short Term goal: Utilizing psychosocial counselor, staff and physician to assist with identification of specific Stressors or current issues interfering with healing process. Setting desired goal for each stressor or current issue identified.;Long Term Goal: Stressors or current issues are controlled or eliminated.;Short Term goal: Identification and review with participant of any Quality of Life or Depression concerns found by scoring the questionnaire.;Long Term goal: The participant improves quality of Life and PHQ9 Scores as seen by post scores and/or verbalization of changes             Quality of Life Scores:  Quality of Life - 01/01/24 1535       Quality of Life   Select Quality of Life      Quality of Life Scores   Health/Function Pre 17.57 %    Health/Function Post 18.73 %    Health/Function % Change 6.6 %    Socioeconomic Pre 16.31 %    Socioeconomic Post 18.57 %    Socioeconomic % Change  13.86 %    Psych/Spiritual Pre 14.86 %    Psych/Spiritual Post 16.1 %    Psych/Spiritual % Change 8.34 %    Family Pre 21.6 %    Family Post 21.1 %    Family % Change -2.31 %    GLOBAL Pre 17.31 %    GLOBAL Post 18.66 %    GLOBAL % Change 7.8 %            Scores of 19 and below usually indicate a poorer quality of life in these areas.  A difference of  2-3 points is a clinically meaningful difference.  A difference of 2-3 points in the total score of the Quality of Life Index has been associated with significant improvement in overall quality of life, self-image, physical symptoms, and general health in studies assessing change in quality of life.  PHQ-9: Review Flowsheet       01/01/2024 10/29/2023 08/08/2020  Depression screen PHQ 2/9  Decreased Interest 1 0 0  Down, Depressed, Hopeless 0 0 0  PHQ - 2  Score 1 0 0  Altered sleeping 0 2 -  Tired, decreased energy 2 1 -  Change in appetite 0 2 -  Feeling bad or failure about yourself  2 2 -  Trouble concentrating 3 0 -  Moving slowly or fidgety/restless 2 0 -  Suicidal thoughts 2 0 -  PHQ-9 Score 12 7 -  Difficult doing work/chores - Not difficult at all -   Interpretation of Total Score  Total Score Depression Severity:  1-4 = Minimal depression, 5-9 = Mild depression, 10-14 = Moderate depression, 15-19 = Moderately severe depression, 20-27 = Severe depression   Psychosocial Evaluation and Intervention:  Psychosocial Evaluation - 10/23/23 1523       Psychosocial Evaluation & Interventions   Interventions Stress management education;Relaxation education;Encouraged to exercise with the program and follow exercise prescription    Comments Patient was referred to [facility] with CABGx4. He denies any depressions but says since his surgery he does have anxiety about his heath and the future. He also says he has trouble sleeping since his surgery due to the anxiety and surgical site discomfort. He is taking Lorazepam for  sleep which helps some. He was started on Cymbalta a few months ago to help manage the stress of his wife and son's cancer diagnosis. He lives with his wife and adult son who are his support people. 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 Both his wife and son were diagnosed with kideny cancer a few months ago and this and his his own health has been a stressor in his life. He has returned to work some. He was exercising some prior to his surgery and enjoys riding his motorcycle. His goals for the program are to keep his weight stable. He and his wife changed their diet after she was diagnosed with cancer and he has lost 30 lbs and he wants to keep this weight off. He also wants to build his energy back up and improve his muscle tone. He is looking forward to pariticpating in the program. No  barriers identified for him to complete the program.    Expected Outcomes Short Term: Start the program and attend consistently. Long Term: Complete the program meeting personal goals.    Continue Psychosocial Services  Follow up required by staff             Psychosocial Re-Evaluation:  Psychosocial Re-Evaluation     Row Name 11/07/23 1129 12/03/23 1130           Psychosocial Re-Evaluation   Current issues with Current Stress Concerns;Current Sleep Concerns Current Stress Concerns;Current Sleep Concerns      Comments Kathlene November is doing well in rehab.  He does have the family stressor of his 64 yo son living at home without a job.  He would like to fix it, but tries not to dwell on it too much.  He will usually start his day at 6am with the dogs.  He gets up a couple times a night to go to bathroom and will also wake up on occiassion too.  He will usually go to chair to nap and then back to bed.  He does take an afternoon nap to help.  He used to weigh 275 lb 12 years ago but fixed his diet to help lose weight and now feels confident in his body. He is also dealing with a recent diagnosis of kidney cancer in his wife.  They are also still adjusting to the new puppy in the house. Kathlene November is doing well in rehab. He has learned to cope with his stresssors.  He looks to see if it is life threatning or need immediate attention.  If not, he takes a breath and does what he can.  He continues to walk with dog each day. He continues to worry about his son and his health.  His son in dealing with kidney cancer.  He wants to be supportive but not take on extra burden.  He is still doing well with his new puppy.  He gets his 4 hrs each night and then 2 hours and then a nap in the afternoon.  He is getting enough, just not all at one time.      Expected Outcomes Short: Continue to use naps to get enough sleep Long; Conitnue to cope with son. Short: Continue to stay positive Long: Continue cope with son.       Interventions Stress management education;Encouraged to attend Cardiac Rehabilitation for the exercise Stress management education;Encouraged to attend Cardiac Rehabilitation for the exercise      Continue Psychosocial Services  Follow up required by staff Follow up required by staff        Initial Review   Source of Stress Concerns Family --               Psychosocial Discharge (Final Psychosocial Re-Evaluation):  Psychosocial Re-Evaluation - 12/03/23 1130       Psychosocial Re-Evaluation   Current issues with Current Stress Concerns;Current Sleep Concerns    Comments Kathlene November is doing well in rehab. He has learned to cope with his stresssors.  He looks to see if it is life threatning or need immediate attention.  If not, he takes a breath and does what he can.  He continues to walk with dog each day. He continues to worry about his son and his health.  His son in dealing with kidney cancer.  He wants to be supportive but not take on extra burden.  He is still doing well with his new puppy.  He gets his 4 hrs each night and then 2 hours and then a nap in the afternoon.  He is getting enough, just not all at one time.    Expected Outcomes Short: Continue to stay positive Long: Continue cope with son.    Interventions Stress management education;Encouraged to attend Cardiac Rehabilitation for the exercise    Continue Psychosocial Services  Follow up required by staff             Vocational Rehabilitation: Provide vocational rehab assistance to qualifying candidates.   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             Education: Education Goals: Education classes will be provided on a weekly basis, covering required  topics. Participant will state understanding/return demonstration of topics presented.  Learning Barriers/Preferences:  Learning Barriers/Preferences - 10/23/23 1532       Learning Barriers/Preferences   Learning Barriers None    Learning Preferences Skilled Demonstration             Education Topics: Hypertension, Hypertension Reduction -Define heart disease and high blood pressure. Discus how high blood pressure affects the body and ways to reduce high blood pressure.   Exercise and Your Heart -Discuss why it is important to exercise, the FITT principles of exercise, normal and abnormal responses to exercise, and how to exercise safely. Flowsheet Row CARDIAC REHAB PHASE II EXERCISE from 12/31/2023 in [place] CARDIAC REHABILITATION  Date 11/12/23  Educator [facility]  Instruction Review Code 1- Verbalizes Understanding       Angina -Discuss definition of angina, causes of angina, treatment of angina, and how to decrease risk of having angina.   Cardiac Medications -Review what the following cardiac medications are used for, how they affect the body, and side effects that may occur when taking the medications.  Medications include Aspirin, Beta blockers, calcium channel blockers, ACE Inhibitors, angiotensin receptor blockers, diuretics, digoxin, and antihyperlipidemics. Flowsheet Row CARDIAC REHAB PHASE II EXERCISE from 12/31/2023 in [place] CARDIAC REHABILITATION  Date 12/24/23  Educator [facility]  Instruction Review Code 1- Verbalizes Understanding       Congestive Heart Failure -Discuss the definition of CHF, how to live with CHF, the signs and symptoms of CHF, and how keep track of weight and  sodium intake. Flowsheet Row CARDIAC REHAB PHASE II EXERCISE from 12/31/2023 in [place] CARDIAC REHABILITATION  Date 12/17/23  Educator [facility]  Instruction Review Code 1- Verbalizes Understanding       Heart Disease and Intimacy -Discus the effect sexual activity has on the heart, how  changes occur during intimacy as we age, and safety during sexual activity. Flowsheet Row CARDIAC REHAB PHASE II EXERCISE from 12/31/2023 in [place] CARDIAC REHABILITATION  Date 11/19/23  Educator jh  Instruction Review Code 1- Verbalizes Understanding       Smoking Cessation / COPD -Discuss different methods to quit smoking, the health benefits of quitting smoking, and the definition of COPD.   Nutrition I: Fats -Discuss the types of cholesterol, what cholesterol does to the heart, and how cholesterol levels can be controlled. Flowsheet Row CARDIAC REHAB PHASE II EXERCISE from 12/31/2023 in [place] CARDIAC REHABILITATION  Date 12/03/23  Educator jh  Instruction Review Code 1- Verbalizes Understanding       Nutrition II: Labels -Discuss the different components of food labels and how to read food label   Heart Parts/Heart Disease and PAD -Discuss the anatomy of the heart, the pathway of blood circulation through the heart, and these are affected by heart disease.   Stress I: Signs and Symptoms -Discuss the causes of stress, how stress may lead to anxiety and depression, and ways to limit stress. Flowsheet Row CARDIAC REHAB PHASE II EXERCISE from 12/31/2023 in [place] CARDIAC REHABILITATION  Date 11/05/23  Educator jh  Instruction Review Code 1- Verbalizes Understanding       Stress II: Relaxation -Discuss different types of relaxation techniques to limit stress. Flowsheet Row CARDIAC REHAB PHASE II EXERCISE from 12/31/2023 in [place] CARDIAC REHABILITATION  Date 11/26/23  Educator jh  Instruction Review Code 1- Verbalizes Understanding       Warning Signs of Stroke / TIA -Discuss definition of a stroke, what the signs and symptoms are of a stroke, and how to identify when someone is having stroke.   Knowledge Questionnaire Score:  Knowledge Questionnaire Score - 01/01/24 1542       Knowledge Questionnaire Score   Post Score 26/26              Core Components/Risk Factors/Patient Goals at Admission:  Personal Goals and Risk Factors at Admission - 10/29/23 1414       Core Components/Risk Factors/Patient Goals on Admission    Weight Management Yes;Weight Maintenance    Intervention Weight Management: Develop a combined nutrition and exercise program designed to reach desired caloric intake, while maintaining appropriate intake of nutrient and fiber, sodium and fats, and appropriate energy expenditure required for the weight goal.;Weight Management: Provide education and appropriate resources to help participant work on and attain dietary goals.    Admit Weight 184 lb 12.8 oz (83.8 kg)    Goal Weight: Short Term 184 lb (83.5 kg)    Goal Weight: Long Term 184 lb (83.5 kg)    Expected Outcomes Short Term: Continue to assess and modify interventions until short term weight is achieved;Long Term: Adherence to nutrition and physical activity/exercise program aimed toward attainment of established weight goal;Weight Maintenance: Understanding of the daily nutrition guidelines, which includes 25-35% calories from fat, 7% or less cal from saturated fats, less than 200mg  cholesterol, less than 1.5gm of sodium, & 5 or more servings of fruits and vegetables daily    Hypertension Yes    Intervention Provide education on lifestyle modifcations including regular physical  activity/exercise, weight management, moderate sodium restriction and increased consumption of fresh fruit, vegetables, and low fat dairy, alcohol moderation, and smoking cessation.;Monitor prescription use compliance.    Expected Outcomes Short Term: Continued assessment and intervention until BP is < 140/49mm HG in hypertensive participants. < 130/63mm HG in hypertensive participants with diabetes, heart failure or chronic kidney disease.;Long Term: Maintenance of blood pressure at goal levels.    Lipids Yes    Intervention Provide education and support for participant on nutrition  & aerobic/resistive exercise along with prescribed medications to achieve LDL 70mg , HDL >40mg .    Expected Outcomes Short Term: Participant states understanding of desired cholesterol values and is compliant with medications prescribed. Participant is following exercise prescription and nutrition guidelines.;Long Term: Cholesterol controlled with medications as prescribed, with individualized exercise RX and with personalized nutrition plan. Value goals: LDL < 70mg , HDL > 40 mg.             Core Components/Risk Factors/Patient Goals Review:   Goals and Risk Factor Review     Row Name 11/07/23 1135 12/03/23 1129           Core Components/Risk Factors/Patient Goals Review   Personal Goals Review Weight Management/Obesity;Hypertension;Lipids Weight Management/Obesity;Hypertension;Lipids      Review Kathlene November is doing well in rehab.  He now has a blood pressure cuff to monitor his pressures at home and they have been good.  His weight is down and has not weighed this little in 50 yrs.  He is pleased with his progress.  He is doing well on his medications and feeling good overall. Kathlene November is doing well in rehab.  His weight has been stable around 183-185 each morning. Blood pressures have been good.  He continues to do well on his meds.  He is doing better about a healthy snack.  He has a snack after rehab and quick nap and ready for the afternoon.      Expected Outcomes Short: Continue to keep eye on blood pressures Long: Continue to monitor risk factors. Short: Continue to work on healthy eating Long; Continue to monitor risk factors.               Core Components/Risk Factors/Patient Goals at Discharge (Final Review):   Goals and Risk Factor Review - 12/03/23 1129       Core Components/Risk Factors/Patient Goals Review   Personal Goals Review Weight Management/Obesity;Hypertension;Lipids    Review Kathlene November is doing well in rehab.  His weight has been stable around 183-185 each morning. Blood  pressures have been good.  He continues to do well on his meds.  He is doing better about a healthy snack.  He has a snack after rehab and quick nap and ready for the afternoon.    Expected Outcomes Short: Continue to work on healthy eating Long; Continue to monitor risk factors.             ITP Comments:  ITP Comments     Row Name 10/29/23 1406 11/03/23 1116 11/26/23 0830 12/24/23 0844 01/06/24 0813   ITP Comments Patient attend orientation today.  Patient is attending Cardiac Rehabilitation Program.  Documentation for diagnosis can be found in [facility] admission 09/18/23 with surgery on 09/23/23.  Reviewed medical chart, RPE/RPD, gym safety, and program guidelines.  Patient was fitted to equipment they will be using during rehab.  Patient is scheduled to start exercise on Monday 11/03/23 at 1100.   Initial ITP created and sent for review and signature by Dr. Dina Rich,  Medical Director for Cardiac Rehabilitation Program. First full day of exercise!  Patient was oriented to gym and equipment including functions, settings, policies, and procedures.  Patient's individual exercise prescription and treatment plan were reviewed.  All starting workloads were established based on the results of the 6 minute walk test done at initial orientation visit.  The plan for exercise progression was also introduced and progression will be customized based on patient's performance and goals. 30 day review completed. ITP sent to Dr. Dina Rich, Medical Director of Cardiac Rehab. Continue with ITP unless changes are made by physician. 30 day review completed. ITP sent to Dr. Dina Rich, Medical Director of Cardiac Rehab. Continue with ITP unless changes are made by physician. Kenyatte graduated today from  rehab with 36 sessions completed.  Details of the patient's exercise prescription and what He needs to do in order to continue the prescription and progress were discussed with patient.  Patient was given a copy  of prescription and goals.  Patient verbalized understanding. Brailyn plans to continue to exercise by walking at home.            Comments: Discharge ITP.

## 2024-01-07 ENCOUNTER — Encounter (HOSPITAL_COMMUNITY): Payer: Medicare Other

## 2024-01-08 ENCOUNTER — Ambulatory Visit (HOSPITAL_BASED_OUTPATIENT_CLINIC_OR_DEPARTMENT_OTHER): Payer: Medicare Other | Admitting: Cardiology

## 2024-01-09 ENCOUNTER — Encounter (HOSPITAL_COMMUNITY): Payer: Medicare Other

## 2024-01-12 ENCOUNTER — Encounter (HOSPITAL_COMMUNITY): Payer: Medicare Other

## 2024-01-14 ENCOUNTER — Encounter (HOSPITAL_COMMUNITY): Payer: Medicare Other

## 2024-01-16 ENCOUNTER — Encounter (HOSPITAL_COMMUNITY): Payer: Medicare Other

## 2024-01-19 ENCOUNTER — Encounter (HOSPITAL_COMMUNITY): Payer: Medicare Other

## 2024-01-21 ENCOUNTER — Encounter (HOSPITAL_COMMUNITY): Payer: Medicare Other

## 2024-01-23 ENCOUNTER — Encounter (HOSPITAL_COMMUNITY): Payer: Medicare Other

## 2024-01-26 ENCOUNTER — Encounter (HOSPITAL_COMMUNITY): Payer: Medicare Other

## 2024-01-30 ENCOUNTER — Institutional Professional Consult (permissible substitution) (INDEPENDENT_AMBULATORY_CARE_PROVIDER_SITE_OTHER): Payer: Medicare Other

## 2024-02-12 ENCOUNTER — Encounter (HOSPITAL_BASED_OUTPATIENT_CLINIC_OR_DEPARTMENT_OTHER): Payer: Self-pay | Admitting: Cardiology

## 2024-02-12 ENCOUNTER — Ambulatory Visit (HOSPITAL_BASED_OUTPATIENT_CLINIC_OR_DEPARTMENT_OTHER): Admitting: Cardiology

## 2024-02-12 VITALS — BP 144/68 | HR 88 | Ht 70.0 in | Wt 199.4 lb

## 2024-02-12 DIAGNOSIS — I25118 Atherosclerotic heart disease of native coronary artery with other forms of angina pectoris: Secondary | ICD-10-CM

## 2024-02-12 DIAGNOSIS — Z8679 Personal history of other diseases of the circulatory system: Secondary | ICD-10-CM

## 2024-02-12 DIAGNOSIS — E782 Mixed hyperlipidemia: Secondary | ICD-10-CM | POA: Diagnosis not present

## 2024-02-12 DIAGNOSIS — R040 Epistaxis: Secondary | ICD-10-CM

## 2024-02-12 NOTE — Progress Notes (Signed)
 Cardiology Office Note:    Date:  02/12/2024   ID:  Jesse Coleman, DOB 11/25/1955, MRN [id number]  PCP:  Rosslyn Coons, MD   [facility] Providers Cardiologist:  Dorothye Gathers, MD     Referring MD: Rosslyn Coons, MD   Post bypass follow-up  History of Present Illness:    Jesse Coleman is a 68 y.o. male with a hx of non-ST elevation myocardial infarction in late December with subsequent CABG.  Initially started having worsening anginal symptoms and ambulance took him to [facility].  He was discouraged because he knew that he had was not going to be able to perform further cardiac care and he asked the ambulance to take him to [facility] however they said it was protocol for them to take him to [facility] and this frustrated him.  Once they are at any [facility] he was seen by cardiology colleague consultant who recommended transfer to [facility] for further cardiac catheterization.  Thankfully he was not having an ST elevation myocardial infarction.  Spent quite a bit of time discussing the process with him and the difference between STEMI and non-STEMI.  He was appreciative.  He was also a bit frustrated about the time he had to wait for bypass surgery.  I did express to him that occasionally in the setting of heart attack especially, it can be beneficial to allow for him to "cool off/decrease inflammation "prior to bypass.  He ended up having a great outcome.  Feels well.  Small seroma at vein harvest site unremarkable currently.  He appreciated his visit with Neomi Banks, NP.  Was very impressed.  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   Medical therapy-1999    Past Surgical History:  Procedure Laterality Date   CARDIAC CATHETERIZATION   2014   LAD 40%, D1&D2 70-80% (1.67mm), CFX 20%, OM1 & OM2 90% (1.O mm), OM3 50%, RCA 90% (1.5-1.75 mm), EF 40-45%, med rx   COLONOSCOPY  2008   [place], [place]    CORONARY ARTERY BYPASS GRAFT N/A 09/23/2023   Procedure: CORONARY ARTERY BYPASS GRAFTING (CABG) X 4, USING LEFT INTERNAL MAMMARY ARTERY AND ENDOSCOPICALLY HARVESTED RIGHT GREATER SAPHENOUS VEIN;  Surgeon: Zelphia Higashi, MD;  Location: [facility] OR;  Service: Open Heart Surgery;  Laterality: N/A;   Dental implant  2007   HERNIA REPAIR  [address] [place]    KNEE SURGERY     LEFT HEART CATH AND CORONARY ANGIOGRAPHY N/A 09/19/2023   Procedure: LEFT HEART CATH AND CORONARY ANGIOGRAPHY;  Surgeon: Knox Perl, MD;  Location: [facility] INVASIVE CV LAB;  Service: Cardiovascular;  Laterality: N/A;   PATCH ANGIOPLASTY  09/23/2023   Procedure: LEFT ANTERIOR DESCENDING ARTERY ENDARTERECTOMY, VEIN PATCH ANGIOPLASTY;  Surgeon: Zelphia Higashi, MD;  Location: [facility] OR;  Service: Open Heart Surgery;;   REFRACTIVE SURGERY Right 2003   TEE WITHOUT CARDIOVERSION N/A 09/23/2023   Procedure: TRANSESOPHAGEAL ECHOCARDIOGRAM (TEE);  Surgeon: Zelphia Higashi, MD;  Location: [facility] OR;  Service: Open Heart Surgery;  Laterality: N/A;   VASECTOMY  2003    Current Medications: Current Meds  Medication Sig   bromocriptine  (PARLODEL ) 2.5 MG tablet Take 1.25 mg by mouth at bedtime.    clopidogrel  (PLAVIX ) 75 MG  tablet Take 1 tablet (75 mg total) by mouth daily.   DULoxetine  (CYMBALTA ) 30 MG capsule Take 30 mg by mouth daily.   ELIQUIS  5 MG TABS tablet TAKE 1 TABLET BY MOUTH TWICE A DAY. (Patient taking differently: Take 5 mg by mouth 2 (two) times daily.)   EPINEPHrine  0.3 mg/0.3 mL IJ SOAJ injection Inject 0.3 mLs (0.3 mg total) into the muscle as needed for anaphylaxis.   LORazepam  (ATIVAN ) 1 MG tablet Take 1 mg by mouth every 8 (eight) hours as needed for anxiety.   metoprolol  tartrate (LOPRESSOR ) 25 MG tablet Take 0.5 tablets (12.5 mg total) by  mouth 2 (two) times daily.   rosuvastatin  (CRESTOR ) 40 MG tablet Take 1 tablet (40 mg total) by mouth every evening.   sildenafil (VIAGRA) 100 MG tablet TAKE 1 TABLET BY MOUTH 30CMINUTES PRIOR TO 1HR BEFORE RELATIONS.   vitamin B-12 (CYANOCOBALAMIN ) 100 MCG tablet Take 100 mcg by mouth 3 (three) times a week.     Allergies:   Amoxicillin-pot clavulanate, Erythromycin, and Latex   Social History   Socioeconomic History   Marital status: Married    Spouse name: Not on file   Number of children: 2   Years of education: Not on file   Highest education level: Not on file  Occupational History   Occupation: Real estate Investment banker, corporate: [employer]   Tobacco Use   Smoking status: Never   Smokeless tobacco: Never  Substance and Sexual Activity   Alcohol use: Yes    Alcohol/week: 1.0 standard drink of alcohol    Types: 1 drink(s) per week   Drug use: No   Sexual activity: Not on file  Other Topics Concern   Not on file  Social History Narrative   Not on file   Social Drivers of Health   Financial Resource Strain: Low Risk  (08/08/2020)   Overall Financial Resource Strain (CARDIA)    Difficulty of Paying Living Expenses: Not hard at all  Food Insecurity: No Food Insecurity (09/19/2023)   Hunger Vital Sign    Worried About Running Out of Food in the Last Year: Never true    Ran Out of Food in the Last Year: Never true  Transportation Needs: No Transportation Needs (09/19/2023)   PRAPARE - Administrator, Civil Service (Medical): No    Lack of Transportation (Non-Medical): No  Physical Activity: Insufficiently Active (08/08/2020)   Exercise Vital Sign    Days of Exercise per Week: 1 day    Minutes of Exercise per Session: 40 min  Stress: No Stress Concern Present (08/08/2020)   [employer] of Occupational Health - Occupational Stress Questionnaire    Feeling of Stress : Not at all  Social Connections: Moderately Integrated (09/29/2023)   Social  Connection and Isolation Panel [NHANES]    Frequency of Communication with Friends and Family: More than three times a week    Frequency of Social Gatherings with Friends and Family: Once a week    Attends Religious Services: More than 4 times per year    Active Member of [employer] or Organizations: No    Attends Banker Meetings: Never    Marital Status: Married     Family History: The patient's family history includes Coronary artery disease (age of onset: 68) in his father; Emphysema in his mother.  ROS:   Please see the history of present illness.     All other systems reviewed and are negative.  EKGs/Labs/Other  Studies Reviewed:    The following studies were reviewed today: Cardiac catheterization echocardiogram reviewed in epic.  EF 50 to 55%.  Apical hypokinesis without thrombus.      Recent Labs: 09/19/2023: B Natriuretic Peptide 1,553.1 09/21/2023: ALT 10 09/27/2023: Magnesium  1.8 09/28/2023: Hemoglobin 8.2; Platelets 139 09/29/2023: BUN 10; Creatinine, Ser 0.98; Potassium 3.8; Sodium 133  Recent Lipid Panel    Component Value Date/Time   CHOL 176 09/20/2023 0319   TRIG 35 09/20/2023 0319   HDL 89 09/20/2023 0319   CHOLHDL 2.0 09/20/2023 0319   VLDL 7 09/20/2023 0319   LDLCALC 80 09/20/2023 0319     Risk Assessment/Calculations:              Physical Exam:    VS:  BP (!) 144/68   Pulse 88   Ht 5\' 10"  (1.778 m)   Wt 199 lb 6.4 oz (90.4 kg)   SpO2 98%   BMI 28.61 kg/m     Wt Readings from Last 3 Encounters:  02/12/24 199 lb 6.4 oz (90.4 kg)  12/29/23 187 lb 6.3 oz (85 kg)  10/29/23 184 lb 12.8 oz (83.8 kg)     GEN:  Well nourished, well developed in no acute distress HEENT: Normal NECK: No JVD; No carotid bruits LYMPHATICS: No lymphadenopathy CARDIAC: Surgical scar healed well RRR, no murmurs, rubs, gallops RESPIRATORY:  Clear to auscultation without rales, wheezing or rhonchi  ABDOMEN: Soft, non-tender,  non-distended MUSCULOSKELETAL:  No edema; No deformity  SKIN: Warm and dry NEUROLOGIC:  Alert and oriented x 3 PSYCHIATRIC:  Normal affect   ASSESSMENT:    1. History of atrial fibrillation   2. Coronary artery disease of native artery of native heart with stable angina pectoris (HCC)   3. Mixed hyperlipidemia   4. Epistaxis    PLAN:    In order of problems listed above:  Coronary artery disease status post CABG x 4 September 23, 2023 post non-ST elevation myocardial infarction - Overall doing well with goal-directed medical therapy. -I think it is reasonable at this point for him to discontinue his Plavix  now or at least 12 months following bypass surgery.  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  Perhaps he could consider low-dose Eliquis  2.5 mg twice a day in the future.  Postop atrial fibrillation - Current EKG shows sinus rhythm.  We are going to stop his amiodarone .  Hypertension - Overall doing well continue monitoring.  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  Consider decreasing dosage to 2.5 twice a day.  Epistaxis - No further nosebleeds.  Excellent.         Medication Adjustments/Labs and Tests Ordered: Current medicines are reviewed at length with the patient today.  Concerns regarding medicines are outlined above.  Orders Placed This Encounter  Procedures   EKG 12-Lead   No orders of the defined types were placed in this encounter.   Patient Instructions  Medication Instructions:  Your physician recommends that you continue on your current medications as directed. Please refer to the Current Medication list given to you today.   *If you need a refill on your cardiac medications before your next appointment, please call your pharmacy*  Lab Work: NONE  Testing/Procedures: NONE  Follow-Up: At [facility], you and your health needs are our  priority.  As part of our continuing mission to provide you with exceptional heart care, our  providers are all part of one team.  This team includes your primary Cardiologist (physician) and Advanced Practice Providers or APPs (Physician Assistants and Nurse Practitioners) who all work together to provide you with the care you need, when you need it.  Your next appointment:   6 month(s)  Provider:   Dorothye Gathers, MD OR NP/PA    We recommend signing up for the patient portal called "MyChart".  Sign up information is provided on this After Visit Summary.  MyChart is used to connect with patients for Virtual Visits (Telemedicine).  Patients are able to view lab/test results, encounter notes, upcoming appointments, etc.  Non-urgent messages can be sent to your provider as well.   To learn more about what you can do with MyChart, go to [email or web address].       Time: 45 minutes spent with patient, discussion, review of extensive medical records, bypass surgery/cath report/echocardiogram/medications, lab data  Signed, Dorothye Gathers, MD  02/12/2024 5:21 PM    [facility]

## 2024-02-12 NOTE — Patient Instructions (Signed)
 Medication Instructions:  Your physician recommends that you continue on your current medications as directed. Please refer to the Current Medication list given to you today.   *If you need a refill on your cardiac medications before your next appointment, please call your pharmacy*  Lab Work: NONE  Testing/Procedures: NONE  Follow-Up: At [facility], you and your health needs are our priority.  As part of our continuing mission to provide you with exceptional heart care, our providers are all part of one team.  This team includes your primary Cardiologist (physician) and Advanced Practice Providers or APPs (Physician Assistants and Nurse Practitioners) who all work together to provide you with the care you need, when you need it.  Your next appointment:   6 month(s)  Provider:   Dorothye Gathers, MD OR NP/PA    We recommend signing up for the patient portal called "MyChart".  Sign up information is provided on this After Visit Summary.  MyChart is used to connect with patients for Virtual Visits (Telemedicine).  Patients are able to view lab/test results, encounter notes, upcoming appointments, etc.  Non-urgent messages can be sent to your provider as well.   To learn more about what you can do with MyChart, go to [email or web address].

## 2024-03-22 NOTE — Telephone Encounter (Signed)
 Patient called and left message on triage voicemail. He states that he needs to cancel the upcoming appointments with Romualdo Humbles on 03/24/24. He would like to reschedule these for some time in December.

## 2024-03-30 ENCOUNTER — Telehealth: Payer: Self-pay

## 2024-03-30 NOTE — Telephone Encounter (Signed)
   Pre-operative Risk Assessment    Patient Name: Jesse Coleman  DOB: [birth year] MRN: [id number]   Date of last office visit: 02/12/24 ONEIL PARCHMENT, MD Date of next office visit: NONE   Request for Surgical Clearance    Procedure:  INGUINAL HERNIA SURGERY  Date of Surgery:  Clearance TBD                                Surgeon:  DEWARD FOY, MD Surgeon's Group or Practice Name:  [facility] Phone number:  [phone] Fax number:  [phone]  ATTN: ROSELINE ARGYLE, CMA   Type of Clearance Requested:   - Medical  - Pharmacy:  Hold Clopidogrel  (Plavix ) and Apixaban  (Eliquis )     Type of Anesthesia:  General    Additional requests/questions:    Signed, Lucie DELENA Ku   03/30/2024, 12:06 PM

## 2024-03-30 NOTE — Telephone Encounter (Signed)
 Dr. Jeffrie  We have received a surgical clearance request for Mr. Remedios for inguinal hernia surgery. They were seen recently in clinic on 02/12/2024. Can you please comment on surgical clearance and guidance on holding Plavix  prior to his surgical procedure. Please forward you guidance and recommendations to P CV DIV PREOP   Thanks, Jackee Alberts, NP

## 2024-03-30 NOTE — Telephone Encounter (Signed)
   Patient Name: Jesse Coleman  DOB: [birth year] MRN: [id number]  Primary Cardiologist: Oneil Parchment, MD  Chart reviewed as part of pre-operative protocol coverage. Given past medical history and time since last visit, based on ACC/AHA guidelines, Kaj Vasil is at acceptable risk for the planned procedure without further cardiovascular testing.   Patient can hold Plavix  5 days prior to procedure and will need to follow-up with hematology regarding guidance for holding Eliquis .  The patient was advised that if he develops new symptoms prior to surgery to contact our office to arrange for a follow-up visit, and he verbalized understanding.  I will route this recommendation to the requesting party via Epic fax function and remove from pre-op pool.  Please call with questions.  Wyn Raddle, Jackee Shove, NP 03/30/2024, 3:10 PM

## 2024-05-10 ENCOUNTER — Other Ambulatory Visit: Payer: Self-pay | Admitting: Endocrinology

## 2024-05-10 DIAGNOSIS — D352 Benign neoplasm of pituitary gland: Secondary | ICD-10-CM

## 2024-05-18 ENCOUNTER — Ambulatory Visit: Payer: Self-pay | Admitting: Surgery

## 2024-05-18 NOTE — Progress Notes (Signed)
 Surgery orders requested via Epic inbox.

## 2024-05-19 ENCOUNTER — Ambulatory Visit
Admission: RE | Admit: 2024-05-19 | Discharge: 2024-05-19 | Disposition: A | Discharge status: Home / Self Care | Source: Ambulatory Visit | Attending: Endocrinology | Admitting: Endocrinology

## 2024-05-19 DIAGNOSIS — D352 Benign neoplasm of pituitary gland: Secondary | ICD-10-CM

## 2024-05-19 MED ORDER — GADOPICLENOL 0.5 MMOL/ML IV SOLN
9.0000 mL | Freq: Once | INTRAVENOUS | Status: AC | PRN
  Administered 2024-05-19: 9 mL via INTRAVENOUS

## 2024-05-24 NOTE — Patient Instructions (Signed)
 SURGICAL WAITING ROOM VISITATION  Patients having surgery or a procedure may have no more than 2 support people in the waiting area - these visitors may rotate.    Children under the age of 54 must have an adult with them who is not the patient.  Visitors with respiratory illnesses are discouraged from visiting and should remain at home.  If the patient needs to stay at the hospital during part of their recovery, the visitor guidelines for inpatient rooms apply. Pre-op nurse will coordinate an appropriate time for 1 support person to accompany patient in pre-op.  This support person may not rotate.    Please refer to the [facility] website for the visitor guidelines for Inpatients (after your surgery is over and you are in a regular room).       Your procedure is scheduled on: 06/09/24   Report to [facility] Main Entrance    Report to admitting at  6:15 AM   Call this number if you have problems the morning of surgery [phone]   Do not eat food :After Midnight.   After Midnight you may have the following liquids until 5:30 AM DAY OF SURGERY  Water Non-Citrus Juices (without pulp, NO RED-Apple, White grape, White cranberry) Black Coffee (NO MILK/CREAM OR CREAMERS, sugar ok)  Clear Tea (NO MILK/CREAM OR CREAMERS, sugar ok) regular and decaf                             Plain Jell-O (NO RED)                                           Fruit ices (not with fruit pulp, NO RED)                                     Popsicles (NO RED)                                                               Sports drinks like Gatorade (NO RED)                  Oral Hygiene is also important to reduce your risk of infection.                                    Remember - BRUSH YOUR TEETH THE MORNING OF SURGERY WITH YOUR REGULAR TOOTHPASTE  DENTURES WILL BE REMOVED PRIOR TO SURGERY PLEASE DO NOT APPLY Poly grip OR ADHESIVES!!!   Do NOT smoke after Midnight   Stop all vitamins and  herbal supplements 7 days before surgery.   Take these medicines the morning of surgery with A SIP OF WATER: Cariprazine(Vraylar), Ezetimibe (zetia ), Hydrocodone, levothyroxine, ativan .  Bring CPAP mask and tubing day of surgery.                              You may not have any metal on  your body including hair pins, jewelry, and body piercing             Do not wear  lotions, powders, cologne, or deodorant              Men may shave face and neck.   Do not bring valuables to the hospital. [facility] IS NOT             RESPONSIBLE   FOR VALUABLES.   Contacts, glasses, dentures or bridgework may not be worn into surgery.  DO NOT BRING YOUR HOME MEDICATIONS TO THE HOSPITAL. PHARMACY WILL DISPENSE MEDICATIONS LISTED ON YOUR MEDICATION LIST TO YOU DURING YOUR ADMISSION IN THE HOSPITAL!    Patients discharged on the day of surgery will not be allowed to drive home.  Someone NEEDS to stay with you for the first 24 hours after anesthesia.   Special Instructions: Bring a copy of your healthcare power of attorney and living will documents the day of surgery if you haven't scanned them before.              Please read over the following fact sheets you were given: IF YOU HAVE QUESTIONS ABOUT YOUR PRE-OP INSTRUCTIONS PLEASE CALL [phone] [place]   If you received a COVID test during your pre-op visit  it is requested that you wear a mask when out in public, stay away from anyone that may not be feeling well and notify your surgeon if you develop symptoms. If you test positive for Covid or have been in contact with anyone that has tested positive in the last 10 days please notify you surgeon.    [facility] - Preparing for Surgery Before surgery, you can play an important role.  Because skin is not sterile, your skin needs to be as free of germs as possible.  You can reduce the number of germs on your skin by washing with CHG (chlorahexidine gluconate) soap before surgery.  CHG is an antiseptic  cleaner which kills germs and bonds with the skin to continue killing germs even after washing. Please DO NOT use if you have an allergy to CHG or antibacterial soaps.  If your skin becomes reddened/irritated stop using the CHG and inform your nurse when you arrive at Short Stay. Do not shave (including legs and underarms) for at least 48 hours prior to the first CHG shower.  You may shave your face/neck.  Please follow these instructions carefully:  1.  Shower with CHG Soap the night before surgery and the  morning of surgery.  2.  If you choose to wash your hair, wash your hair first as usual with your normal  shampoo.  3.  After you shampoo, rinse your hair and body thoroughly to remove the shampoo.                             4.  Use CHG as you would any other liquid soap.  You can apply chg directly to the skin and wash.  Gently with a scrungie or clean washcloth.  5.  Apply the CHG Soap to your body ONLY FROM THE NECK DOWN.   Do   not use on face/ open                           Wound or open sores. Avoid contact with eyes, ears mouth and   genitals (private parts).  Wash face,  Genitals (private parts) with your normal soap.             6.  Wash thoroughly, paying special attention to the area where your    surgery  will be performed.  7.  Thoroughly rinse your body with warm water from the neck down.  8.  DO NOT shower/wash with your normal soap after using and rinsing off the CHG Soap.                9.  Pat yourself dry with a clean towel.            10.  Wear clean pajamas.            11.  Place clean sheets on your bed the night of your first shower and do not  sleep with pets. Day of Surgery : Do not apply any lotions/deodorants the morning of surgery.  Please wear clean clothes to the hospital/surgery center.  FAILURE TO FOLLOW THESE INSTRUCTIONS MAY RESULT IN THE CANCELLATION OF YOUR SURGERY  PATIENT SIGNATURE_________________________________  NURSE  SIGNATURE__________________________________  ________________________________________________________________________

## 2024-05-24 NOTE — Progress Notes (Signed)
 COVID Vaccine received:  [x]  No []  Yes Date of any COVID positive Test in last 90 days: no PCP - Dr. Garnette Ore Cardiologist - Dr. Oneil Parchment  Cardiac clearance- 03/30/24- Jackee Alberts NP  Chest x-ray - 10/15/23 Epic EKG -  02/12/24 Epic Stress Test -  ECHO - 09/20/23 Epic Cardiac Cath - 09/19/23 Epic  Bowel Prep - [x]  No  []   Yes ______  Pacemaker / ICD device [x]  No []  Yes   Spinal Cord Stimulator:[x]  No []  Yes       History of Sleep Apnea? [x]  No []  Yes   CPAP used?- [x]  No []  Yes    Does the patient monitor blood sugar?          [x]  No []  Yes  []  N/A  Patient has: [x]  NO Hx DM   []  Pre-DM                 []  DM1  []   DM2 Does patient have a [employer] or [employer]? []  No []  Yes   Fasting Blood Sugar Ranges-  Checks Blood Sugar _____ times a day  GLP1 agonist / usual dose - no GLP1 instructions:  SGLT-2 inhibitors / usual dose - no SGLT-2 instructions:   Blood Thinner / Instructions:Eliquis  Q day. Will call prescriber to see when to stop. Aspirin  Instructions:no  Comments:   Activity level: Patient is able  to climb a flight of stairs without difficulty; [x]  No CP  [x]  No SOB,   Patient can perform ADLs without assistance.   Anesthesia review: CAD, HTN,PE, On Eliquis , Denies being on Plavix , MI w/ CABG 09/23/23  Patient denies shortness of breath, fever, cough and chest pain at PAT appointment.  Patient verbalized understanding and agreement to the Pre-Surgical Instructions that were given to them at this PAT appointment. Patient was also educated of the need to review these PAT instructions again prior to his/her surgery.I reviewed the appropriate phone numbers to call if they have any and questions or concerns.

## 2024-05-25 ENCOUNTER — Encounter (HOSPITAL_COMMUNITY): Payer: Self-pay

## 2024-05-25 ENCOUNTER — Other Ambulatory Visit: Payer: Self-pay

## 2024-05-25 ENCOUNTER — Encounter (HOSPITAL_COMMUNITY)
Admission: RE | Admit: 2024-05-25 | Discharge: 2024-05-25 | Disposition: A | Discharge status: Home / Self Care | Source: Ambulatory Visit | Attending: Surgery | Admitting: Surgery

## 2024-05-25 VITALS — BP 156/73 | HR 65 | Temp 98.0°F | Resp 16 | Ht 70.0 in | Wt 213.0 lb

## 2024-05-25 DIAGNOSIS — Z01812 Encounter for preprocedural laboratory examination: Secondary | ICD-10-CM | POA: Insufficient documentation

## 2024-05-25 DIAGNOSIS — Z01818 Encounter for other preprocedural examination: Secondary | ICD-10-CM

## 2024-05-25 DIAGNOSIS — I10 Essential (primary) hypertension: Secondary | ICD-10-CM | POA: Insufficient documentation

## 2024-05-25 HISTORY — DX: Bipolar disorder, unspecified: F31.9

## 2024-05-25 HISTORY — DX: Acute myocardial infarction, unspecified: I21.9

## 2024-05-25 HISTORY — DX: Unspecified osteoarthritis, unspecified site: M19.90

## 2024-05-25 HISTORY — DX: Other complications of anesthesia, initial encounter: T88.59XA

## 2024-05-25 HISTORY — DX: Depression, unspecified: F32.A

## 2024-05-25 HISTORY — DX: Personal history of urinary calculi: Z87.442

## 2024-05-25 LAB — CBC
HCT: 45.8 % (ref 39.0–52.0)
Hemoglobin: 14.6 g/dL (ref 13.0–17.0)
MCH: 31.3 pg (ref 26.0–34.0)
MCHC: 31.9 g/dL (ref 30.0–36.0)
MCV: 98.1 fL (ref 80.0–100.0)
Platelets: 148 K/uL — ABNORMAL LOW (ref 150–400)
RBC: 4.67 MIL/uL (ref 4.22–5.81)
RDW: 13 % (ref 11.5–15.5)
WBC: 4.6 K/uL (ref 4.0–10.5)
nRBC: 0 % (ref 0.0–0.2)

## 2024-05-25 LAB — BASIC METABOLIC PANEL WITH GFR
Anion gap: 9 (ref 5–15)
BUN: 12 mg/dL (ref 8–23)
CO2: 25 mmol/L (ref 22–32)
Calcium: 9.8 mg/dL (ref 8.9–10.3)
Chloride: 107 mmol/L (ref 98–111)
Creatinine, Ser: 1.06 mg/dL (ref 0.61–1.24)
GFR, Estimated: >60 mL/min (ref >60)
Glucose, Bld: 77 mg/dL (ref 70–99)
Potassium: 4.7 mmol/L (ref 3.5–5.1)
Sodium: 141 mmol/L (ref 135–145)

## 2024-05-27 NOTE — Progress Notes (Signed)
 Case: [id number] Date/Time: 06/09/24 0815   Procedure: REPAIR, HERNIA, INGUINAL, LAPAROSCOPIC (Right) - LAP RIGHT INGUINAL HERNIA REPAIR WITH MESH   Anesthesia type: General   Diagnosis: Inguinal hernia without obstruction or gangrene, recurrence not specified, unspecified laterality [K40.90]   Pre-op diagnosis: RIGHT INGUINAL HERNIA   Location: [facility] ROOM 01 / [facility]   Surgeons: Lyndel Deward PARAS, MD       DISCUSSION: Jesse Coleman is a 68 yo male with PMH of HTN, hx of NSTEMI, CAD s/p CABG x4 (08/2023), ischemic cardiomyopathy, hx of PE on Eliquis , pituitary adenoma, bipolar d/o, depression, anxiety.  Prior complication from anesthesia includes prolonged emergence, hypotension.  Patient follows with Cardiology for hx of NSTEMI in 08/2023 followed by CABG x 4 and endarterectomy of LAD with vein patch and angioplasty for known multivessel CAD. Had post op A.fib managed with Amiodarone . Also with ischemic cardiomyopathy with recovered EF 50-55% by last echo on 09/20/2023. Last seen in clinic on 02/12/24 by Dr. Jeffrie. Advised to d/c Amiodarone . Advised to continue Plavix  for 12 months following CABG (however pt states he no longer takes Plavix ). He was cleared for surgery:  Chart reviewed as part of pre-operative protocol coverage. Given past medical history and time since last visit, based on ACC/AHA guidelines, Carnie Bruemmer is at acceptable risk for the planned procedure without further cardiovascular testing.  Patient can hold Plavix  5 days prior to procedure and will need to follow-up with hematology regarding guidance for holding Eliquis .  Patient follows with Hematology for hx of unprovoked PE. Hypercoagulable w/u was negative however he takes Eliquis . Last seen on 09/01/2023. Advised 1 year f/u.  LD Plavix : (Pt stated to PAT RN that he does not take Plavix  anymore)  LD Eliquis : 9/7  VS: BP (!) 156/73   Pulse 65   Temp 36.7 C (Oral)   Resp 16   Ht 5' 10 (1.778 m)   Wt 96.6 kg    SpO2 100%   BMI 30.56 kg/m   PROVIDERS: Nichole Senior, MD   LABS: Labs reviewed: Acceptable for surgery. (all labs ordered are listed, but only abnormal results are displayed)  Labs Reviewed  CBC - Abnormal; Notable for the following components:      Result Value   Platelets 148 (*)    All other components within normal limits  BASIC METABOLIC PANEL WITH GFR     IMAGES:   EKG 02/12/24:  Sinus rhythm with 1st degree A-V block Anterior infarct (cited on or before 12-Feb-2024)  CV:  Echo 09/20/23:  IMPRESSIONS    1. Left ventricular ejection fraction, by estimation, is 50 to 55%. Left ventricular ejection fraction by 3D volume is 52 %. The left ventricle has low normal function. The left ventricle demonstrates regional wall motion abnormalities (see scoring diagram/findings for description). Left ventricular diastolic parameters were normal. apical hypokinesis without thrombus.  2. Right ventricular systolic function is normal. The right ventricular size is normal.  3. The mitral valve is grossly normal. Mild mitral valve regurgitation. No evidence of mitral stenosis.  4. The aortic valve is tricuspid. Aortic valve regurgitation is trivial. No aortic stenosis is present.  Left Heart Catheterization 09/19/23:  Hemodynamic data: LV 122/0, EDP 10 mmHg.  Ao 108/61, mean 79 mmHg.  No pressure gradient across the aortic valve.   Angiographic data: Severe coronary calcification of all the coronary arteries. LV: Marked global hypokinesis.  LVEF 25 to at most 30%. LM: Calcified but large-caliber vessel.  No luminal obstruction. LAD: Severely calcified throughout.  Proximal LAD has eccentric 70 to 80% stenosis. Small D1 and D2 with high-grade ostial stenosis of 90% and diffuse disease.  Moderate size D3 with ostial 99% stenosis.  LAD CTO after D3 origin.  Distal LAD collateralized by ipsilateral and contralateral collaterals.  Distal LAD appears bypassable. LCx:  Dominant.  Moderate disease in the ostium and proximal segment of 40%.  Very tiny OM1 and 2 with severe disease, large OM 3 with ostial 90% stenosis.  Distal CX has mild disease. RI: CTO in the ostium.  Ipsilateral collaterals.  The vessel faintly and appears to be small to moderate caliber vessel however large distribution. RCA: Nondominant with diffuse disease with a proximal 90% focal stenosis.  Gives collaterals to distal LAD.   Impression and recommendations: Severe diffuse calcific three-vessel coronary artery disease.  Severely reduced LVEF.  Will refer for CT surgery for consideration for CABG. Past Medical History:  Diagnosis Date   Arthritis    Bipolar disorder (HCC)    CAD (coronary artery disease)    Complication of anesthesia    Hard to wake up. 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   Medical therapy-1999    Past Surgical History:  Procedure Laterality Date   CARDIAC CATHETERIZATION  2014   LAD 40%, D1&D2 70-80% (1.77mm), CFX 20%, OM1 & OM2 90% (1.O mm), OM3 50%, RCA 90% (1.5-1.75 mm), EF 40-45%, med rx   COLONOSCOPY  2008   [place], [place]    CORONARY ARTERY BYPASS GRAFT N/A 09/23/2023   Procedure: CORONARY ARTERY BYPASS GRAFTING (CABG) X 4, USING LEFT INTERNAL MAMMARY ARTERY AND ENDOSCOPICALLY HARVESTED RIGHT GREATER SAPHENOUS VEIN;  Surgeon: Kerrin Elspeth BROCKS, MD;  Location[facility];  Service: Open Heart Surgery;  Laterality: N/A;   Dental implant  2007   HERNIA REPAIR  [address] [place]    KNEE SURGERY     LEFT HEART CATH AND CORONARY ANGIOGRAPHY N/A 09/19/2023   Procedure: LEFT HEART CATH AND CORONARY ANGIOGRAPHY;  Surgeon: Ladona Heinz, MD;  [facility]VAS[facility] CV LAB;  Service: Cardiovascular;  Laterality: N/A;   PATCH ANGIOPLASTY  09/23/2023    Procedure: LEFT ANTERIOR DESCENDING ARTERY ENDARTERECTOMY, VEIN PATCH ANGIOPLASTY;  Surgeon: Kerrin Elspeth BROCKS, MD;  Location: [facility];  Service: Open Heart Surgery;;   REFRACTIVE SURGERY Right 2003   TEE WITHOUT CARDIOVERSION N/A 09/23/2023   Procedure: TRANSESOPHAGEAL ECHOCARDIOGRAM (TEE);  Surgeon: Kerrin Elspeth BROCKS, MD;  Location: [facility];  Service: Open Heart Surgery;  Laterality: N/A;   VASECTOMY  2003    MEDICATIONS:  bromocriptine  (PARLODEL ) 2.5 MG tablet   cariprazine (VRAYLAR) 1.5 MG capsule   clopidogrel  (PLAVIX ) 75 MG tablet   ELIQUIS  5 MG TABS tablet   EPINEPHrine  0.3 mg/0.3 mL IJ SOAJ injection   ezetimibe  (ZETIA ) 10 MG tablet   HYDROcodone-acetaminophen  (NORCO/VICODIN) 5-325 MG tablet   levothyroxine (SYNTHROID) 100 MCG tablet   LORazepam  (ATIVAN ) 1 MG tablet   metoprolol  tartrate (LOPRESSOR ) 25 MG tablet   nitroGLYCERIN  (NITROSTAT ) 0.4 MG SL tablet   rosuvastatin  (CRESTOR ) 40 MG tablet   sildenafil (VIAGRA) 100 MG tablet   traMADol  (ULTRAM ) 50 MG tablet   vitamin B-12 (CYANOCOBALAMIN ) 100 MCG tablet   No current facility-administered medications for this encounter.   Burnard CHRISTELLA Senna, PA-C [facility]  Surgical Short Stay/Anesthesiology [facility] Phone [phone] 06/07/2024 11:31 AM

## 2024-06-07 NOTE — Anesthesia Preprocedure Evaluation (Signed)
 Anesthesia Evaluation  Patient identified by MRN, date of birth, ID band Patient awake    Reviewed: Allergy & Precautions, NPO status , Patient's Chart, lab work & pertinent test results  History of Anesthesia Complications Negative for: history of anesthetic complications  Airway Mallampati: II  TM Distance: >3 FB Neck ROM: Full    Dental no notable dental hx. (+) Teeth Intact   Pulmonary neg pulmonary ROS, neg sleep apnea, neg COPD, Patient abstained from smoking.Not current smoker   Pulmonary exam normal breath sounds clear to auscultation       Cardiovascular Exercise Tolerance: Good METShypertension, Pt. on medications + angina  + CAD, + Past MI and + CABG  (-) dysrhythmias  Rhythm:Regular Rate:Normal - Systolic murmurs CABG 08/2023. Good METS. No longer on plavix .   Neuro/Psych  PSYCHIATRIC DISORDERS Anxiety Depression Bipolar Disorder   negative neurological ROS     GI/Hepatic ,neg GERD  ,,(+)     (-) substance abuse    Endo/Other  neg diabetes    Renal/GU negative Renal ROS     Musculoskeletal  (+) Arthritis ,    Abdominal   Peds  Hematology   Anesthesia Other Findings Past Medical History: No date: Arthritis No date: Bipolar disorder (HCC) No date: CAD (coronary artery disease) No date: Complication of anesthesia     Comment:  Hard to wake up. Low B/P No date: Depression No date: Erectile dysfunction No date: Hematospermia No date: History of kidney stones No date: Hyperlipidemia 2014: Hypertension No date: Myocardial infarction (HCC) No date: Overweight(278.02) 1999: Prolactin-secreting pituitary adenoma (HCC)     Comment:  Medical therapy-1999  Reproductive/Obstetrics                              Anesthesia Physical Anesthesia Plan  ASA: 3  Anesthesia Plan: General   Post-op Pain Management: Gabapentin  PO (pre-op)* and Ofirmev  IV (intra-op)*   Induction:  Intravenous  PONV Risk Score and Plan: 4 or greater and Ondansetron , Dexamethasone  and Treatment may vary due to age or medical condition  Airway Management Planned: Oral ETT  Additional Equipment: None  Intra-op Plan:   Post-operative Plan: Extubation in OR  Informed Consent: I have reviewed the patients History and Physical, chart, labs and discussed the procedure including the risks, benefits and alternatives for the proposed anesthesia with the patient or authorized representative who has indicated his/her understanding and acceptance.     Dental advisory given  Plan Discussed with: CRNA and Surgeon  Anesthesia Plan Comments: (Discussed risks of anesthesia with patient, including PONV, sore throat, lip/dental/eye damage, post operative cognitive dysfunction. Rare risks discussed as well, such as cardiorespiratory and neurological sequelae, and allergic reactions. Discussed the role of CRNA in patient's perioperative care. Patient understands.)         Anesthesia Quick Evaluation

## 2024-06-09 ENCOUNTER — Ambulatory Visit (HOSPITAL_COMMUNITY): Payer: Self-pay | Admitting: Medical

## 2024-06-09 ENCOUNTER — Other Ambulatory Visit: Payer: Self-pay

## 2024-06-09 ENCOUNTER — Ambulatory Visit (HOSPITAL_COMMUNITY): Admitting: Anesthesiology

## 2024-06-09 ENCOUNTER — Encounter (HOSPITAL_COMMUNITY)
Admission: RE | Disposition: A | Discharge status: Home / Self Care | Payer: Self-pay | Source: Ambulatory Visit | Attending: Surgery

## 2024-06-09 ENCOUNTER — Ambulatory Visit (HOSPITAL_COMMUNITY)
Admission: RE | Admit: 2024-06-09 | Discharge: 2024-06-09 | Disposition: A | Discharge status: Home / Self Care | Source: Ambulatory Visit | Attending: Surgery | Admitting: Surgery

## 2024-06-09 ENCOUNTER — Encounter (HOSPITAL_COMMUNITY): Payer: Self-pay | Admitting: Surgery

## 2024-06-09 DIAGNOSIS — F319 Bipolar disorder, unspecified: Secondary | ICD-10-CM | POA: Diagnosis not present

## 2024-06-09 DIAGNOSIS — F419 Anxiety disorder, unspecified: Secondary | ICD-10-CM | POA: Insufficient documentation

## 2024-06-09 DIAGNOSIS — I25119 Atherosclerotic heart disease of native coronary artery with unspecified angina pectoris: Secondary | ICD-10-CM | POA: Diagnosis not present

## 2024-06-09 DIAGNOSIS — Z951 Presence of aortocoronary bypass graft: Secondary | ICD-10-CM | POA: Insufficient documentation

## 2024-06-09 DIAGNOSIS — K4030 Unilateral inguinal hernia, with obstruction, without gangrene, not specified as recurrent: Secondary | ICD-10-CM | POA: Diagnosis not present

## 2024-06-09 DIAGNOSIS — K4090 Unilateral inguinal hernia, without obstruction or gangrene, not specified as recurrent: Secondary | ICD-10-CM | POA: Diagnosis not present

## 2024-06-09 DIAGNOSIS — I252 Old myocardial infarction: Secondary | ICD-10-CM | POA: Insufficient documentation

## 2024-06-09 DIAGNOSIS — I10 Essential (primary) hypertension: Secondary | ICD-10-CM | POA: Diagnosis not present

## 2024-06-09 DIAGNOSIS — I25110 Atherosclerotic heart disease of native coronary artery with unstable angina pectoris: Secondary | ICD-10-CM | POA: Diagnosis not present

## 2024-06-09 DIAGNOSIS — Z79899 Other long term (current) drug therapy: Secondary | ICD-10-CM | POA: Diagnosis not present

## 2024-06-09 DIAGNOSIS — I255 Ischemic cardiomyopathy: Secondary | ICD-10-CM | POA: Diagnosis not present

## 2024-06-09 HISTORY — PX: INGUINAL HERNIA REPAIR: SHX194

## 2024-06-09 SURGERY — REPAIR, HERNIA, INGUINAL, LAPAROSCOPIC
Anesthesia: General | Site: Abdomen | Laterality: Right

## 2024-06-09 MED ORDER — EPHEDRINE 5 MG/ML INJ
INTRAVENOUS | Status: AC
  Filled 2024-06-09: qty 5

## 2024-06-09 MED ORDER — PROPOFOL 10 MG/ML IV BOLUS
INTRAVENOUS | Status: AC
Start: 2024-06-09 — End: 2024-06-09
  Filled 2024-06-09: qty 20

## 2024-06-09 MED ORDER — LACTATED RINGERS IR SOLN
Status: DC | PRN
  Administered 2024-06-09: 1000 mL

## 2024-06-09 MED ORDER — BUPIVACAINE-EPINEPHRINE (PF) 0.25% -1:200000 IJ SOLN
INTRAMUSCULAR | Status: DC | PRN
  Administered 2024-06-09: 30 mL

## 2024-06-09 MED ORDER — GLYCOPYRROLATE 0.2 MG/ML IJ SOLN
INTRAMUSCULAR | Status: AC
  Filled 2024-06-09: qty 1

## 2024-06-09 MED ORDER — CHLORHEXIDINE GLUCONATE CLOTH 2 % EX PADS
6.0000 | MEDICATED_PAD | Freq: Once | CUTANEOUS | Status: DC
Start: 2024-06-09 — End: 2024-06-09

## 2024-06-09 MED ORDER — ACETAMINOPHEN 500 MG PO TABS
1000.0000 mg | ORAL_TABLET | ORAL | Status: DC
Start: 2024-06-09 — End: 2024-06-09
  Filled 2024-06-09: qty 2

## 2024-06-09 MED ORDER — MIDAZOLAM HCL 2 MG/2ML IJ SOLN
INTRAMUSCULAR | Status: AC
  Filled 2024-06-09: qty 2

## 2024-06-09 MED ORDER — LIDOCAINE HCL (PF) 2 % IJ SOLN
INTRAMUSCULAR | Status: DC | PRN
  Administered 2024-06-09: 100 mg via INTRADERMAL

## 2024-06-09 MED ORDER — PROPOFOL 10 MG/ML IV BOLUS
INTRAVENOUS | Status: DC | PRN
Start: 2024-06-09 — End: 2024-06-09
  Administered 2024-06-09: 150 mg via INTRAVENOUS

## 2024-06-09 MED ORDER — LACTATED RINGERS IV SOLN
INTRAVENOUS | Status: DC
Start: 2024-06-09 — End: 2024-06-09

## 2024-06-09 MED ORDER — LABETALOL HCL 5 MG/ML IV SOLN
5.0000 mg | INTRAVENOUS | Status: DC | PRN
  Administered 2024-06-09: 5 mg via INTRAVENOUS

## 2024-06-09 MED ORDER — SODIUM CHLORIDE 0.9 % IR SOLN
Status: DC | PRN
  Administered 2024-06-09: 1000 mL

## 2024-06-09 MED ORDER — OXYCODONE-ACETAMINOPHEN 5-325 MG PO TABS: 1.0000 | ORAL_TABLET | ORAL | 0 refills | Status: DC | PRN

## 2024-06-09 MED ORDER — MIDAZOLAM HCL 2 MG/2ML IJ SOLN
INTRAMUSCULAR | Status: DC | PRN
  Administered 2024-06-09: 1 mg via INTRAVENOUS

## 2024-06-09 MED ORDER — ACETAMINOPHEN 10 MG/ML IV SOLN: 1000.0000 mg | Freq: Once | INTRAVENOUS | Status: DC | PRN

## 2024-06-09 MED ORDER — SUGAMMADEX SODIUM 200 MG/2ML IV SOLN
INTRAVENOUS | Status: DC | PRN
  Administered 2024-06-09: 200 mg via INTRAVENOUS

## 2024-06-09 MED ORDER — CEFAZOLIN SODIUM-DEXTROSE 2-4 GM/100ML-% IV SOLN
2.0000 g | INTRAVENOUS | Status: AC
  Administered 2024-06-09: 2 g via INTRAVENOUS
  Filled 2024-06-09: qty 100

## 2024-06-09 MED ORDER — DEXAMETHASONE SODIUM PHOSPHATE 10 MG/ML IJ SOLN
INTRAMUSCULAR | Status: AC
  Filled 2024-06-09: qty 1

## 2024-06-09 MED ORDER — LABETALOL HCL 5 MG/ML IV SOLN: 10.0000 mg | INTRAVENOUS | Status: DC | PRN

## 2024-06-09 MED ORDER — CHLORHEXIDINE GLUCONATE 0.12 % MT SOLN
15.0000 mL | Freq: Once | OROMUCOSAL | Status: AC
  Administered 2024-06-09: 15 mL via OROMUCOSAL

## 2024-06-09 MED ORDER — FENTANYL CITRATE (PF) 250 MCG/5ML IJ SOLN
INTRAMUSCULAR | Status: DC | PRN
  Administered 2024-06-09: 50 ug via INTRAVENOUS
  Administered 2024-06-09: 25 ug via INTRAVENOUS
  Administered 2024-06-09 (×2): 50 ug via INTRAVENOUS
  Administered 2024-06-09: 75 ug via INTRAVENOUS

## 2024-06-09 MED ORDER — DEXAMETHASONE SODIUM PHOSPHATE 10 MG/ML IJ SOLN
INTRAMUSCULAR | Status: DC | PRN
Start: 2024-06-09 — End: 2024-06-09
  Administered 2024-06-09: 8 mg via INTRAVENOUS

## 2024-06-09 MED ORDER — SUGAMMADEX SODIUM 200 MG/2ML IV SOLN
INTRAVENOUS | Status: AC
  Filled 2024-06-09: qty 2

## 2024-06-09 MED ORDER — ONDANSETRON HCL 4 MG/2ML IJ SOLN: 4.0000 mg | Freq: Once | INTRAMUSCULAR | Status: DC | PRN

## 2024-06-09 MED ORDER — ONDANSETRON HCL 4 MG/2ML IJ SOLN
INTRAMUSCULAR | Status: AC
  Filled 2024-06-09: qty 2

## 2024-06-09 MED ORDER — GABAPENTIN 300 MG PO CAPS
300.0000 mg | ORAL_CAPSULE | ORAL | Status: AC
  Administered 2024-06-09: 300 mg via ORAL
  Filled 2024-06-09: qty 1

## 2024-06-09 MED ORDER — OXYCODONE HCL 5 MG PO TABS: 5.0000 mg | ORAL_TABLET | Freq: Once | ORAL | Status: DC | PRN

## 2024-06-09 MED ORDER — OXYCODONE HCL 5 MG/5ML PO SOLN: 5.0000 mg | Freq: Once | ORAL | Status: DC | PRN

## 2024-06-09 MED ORDER — ROCURONIUM BROMIDE 100 MG/10ML IV SOLN
INTRAVENOUS | Status: DC | PRN
Start: 2024-06-09 — End: 2024-06-09
  Administered 2024-06-09: 20 mg via INTRAVENOUS
  Administered 2024-06-09: 50 mg via INTRAVENOUS

## 2024-06-09 MED ORDER — CHLORHEXIDINE GLUCONATE CLOTH 2 % EX PADS: 6.0000 | MEDICATED_PAD | Freq: Once | CUTANEOUS | Status: DC

## 2024-06-09 MED ORDER — FENTANYL CITRATE (PF) 250 MCG/5ML IJ SOLN
INTRAMUSCULAR | Status: AC
  Filled 2024-06-09: qty 5

## 2024-06-09 MED ORDER — LABETALOL HCL 5 MG/ML IV SOLN
INTRAVENOUS | Status: AC
  Filled 2024-06-09: qty 4

## 2024-06-09 MED ORDER — ORAL CARE MOUTH RINSE: 15.0000 mL | Freq: Once | OROMUCOSAL | Status: AC

## 2024-06-09 MED ORDER — BUPIVACAINE-EPINEPHRINE (PF) 0.25% -1:200000 IJ SOLN
INTRAMUSCULAR | Status: AC
  Filled 2024-06-09: qty 30

## 2024-06-09 MED ORDER — EPHEDRINE SULFATE-NACL 50-0.9 MG/10ML-% IV SOSY
PREFILLED_SYRINGE | INTRAVENOUS | Status: DC | PRN
Start: 2024-06-09 — End: 2024-06-09
  Administered 2024-06-09: 10 mg via INTRAVENOUS
  Administered 2024-06-09: 5 mg via INTRAVENOUS

## 2024-06-09 MED ORDER — ONDANSETRON HCL 4 MG/2ML IJ SOLN
INTRAMUSCULAR | Status: DC | PRN
  Administered 2024-06-09: 4 mg via INTRAVENOUS

## 2024-06-09 MED ORDER — FENTANYL CITRATE PF 50 MCG/ML IJ SOSY: 25.0000 ug | PREFILLED_SYRINGE | INTRAMUSCULAR | Status: DC | PRN

## 2024-06-09 MED ORDER — LIDOCAINE HCL (PF) 2 % IJ SOLN
INTRAMUSCULAR | Status: AC
  Filled 2024-06-09: qty 5

## 2024-06-09 MED ORDER — ROCURONIUM BROMIDE 10 MG/ML (PF) SYRINGE
PREFILLED_SYRINGE | INTRAVENOUS | Status: AC
  Filled 2024-06-09: qty 10

## 2024-06-09 SURGICAL SUPPLY — 32 items
BAG COUNTER SPONGE SURGICOUNT (BAG) ×1 IMPLANT
CABLE HIGH FREQUENCY MONO STRZ (ELECTRODE) ×1 IMPLANT
CHLORAPREP W/TINT 26 (MISCELLANEOUS) ×1 IMPLANT
COVER SURGICAL LIGHT HANDLE (MISCELLANEOUS) ×1 IMPLANT
DERMABOND ADVANCED .7 DNX12 (GAUZE/BANDAGES/DRESSINGS) ×1 IMPLANT
ELECT REM PT RETURN 15FT ADLT (MISCELLANEOUS) ×1 IMPLANT
GLOVE BIO SURGEON STRL SZ7.5 (GLOVE) ×1 IMPLANT
GLOVE INDICATOR 8.0 STRL GRN (GLOVE) ×1 IMPLANT
GLOVE SURG SYN 7.5 PF PI (GLOVE) IMPLANT
GOWN STRL REUS W/ TWL XL LVL3 (GOWN DISPOSABLE) ×1 IMPLANT
GRASPER SUT TROCAR 14GX15 (MISCELLANEOUS) ×1 IMPLANT
IRRIGATION SUCT STRKRFLW 2 WTP (MISCELLANEOUS) IMPLANT
KIT BASIN OR (CUSTOM PROCEDURE TRAY) ×1 IMPLANT
KIT TURNOVER KIT A (KITS) ×1 IMPLANT
MARKER SKIN DUAL TIP RULER LAB (MISCELLANEOUS) ×1 IMPLANT
MESH 3DMAX 5X7 RT XLRG (Mesh General) IMPLANT
NDL INSUFFLATION 14GA 120MM (NEEDLE) ×1 IMPLANT
NEEDLE INSUFFLATION 14GA 120MM (NEEDLE) ×1 IMPLANT
RELOAD STAPLE 4.0 BLU F/HERNIA (INSTRUMENTS) ×1 IMPLANT
RELOAD STAPLE 4.8 BLK F/HERNIA (STAPLE) IMPLANT
SCISSORS LAP 5X35 DISP (ENDOMECHANICALS) ×1 IMPLANT
SET TUBE SMOKE EVAC HIGH FLOW (TUBING) ×1 IMPLANT
SLEEVE Z-THREAD 5X100MM (TROCAR) ×1 IMPLANT
SPIKE FLUID TRANSFER (MISCELLANEOUS) IMPLANT
STAPLER HERNIA 12 8.5 360D (INSTRUMENTS) ×1 IMPLANT
SUT MNCRL AB 4-0 PS2 18 (SUTURE) ×1 IMPLANT
SUT VICRYL 0 UR6 27IN ABS (SUTURE) IMPLANT
TOWEL OR 17X26 10 PK STRL BLUE (TOWEL DISPOSABLE) ×1 IMPLANT
TRAY FOL W/BAG SLVR 16FR STRL (SET/KITS/TRAYS/PACK) IMPLANT
TRAY LAPAROSCOPIC (CUSTOM PROCEDURE TRAY) ×1 IMPLANT
TROCAR ADV FIXATION 12X100MM (TROCAR) ×1 IMPLANT
TROCAR Z-THREAD FIOS 5X100MM (TROCAR) ×1 IMPLANT

## 2024-06-09 NOTE — Op Note (Signed)
   Patient: Jesse Coleman (1956/09/17, [id number])  Date of Surgery: 06/09/2024  Preoperative Diagnosis: RIGHT INGUINAL HERNIA   Postoperative Diagnosis: INDIRECT RIGHT INGUINAL HERNIA   Surgical Procedure: Transabdominal preperitoneal laparoscopic right inguinal hernia repair with mesh   Operative Team Members:  Surgeons and Role:    * Corbett Moulder, Deward PARAS, MD - Primary   Anesthesiologist: Boone Fess, MD CRNA: Augusta Daved SAILOR, CRNA   Anesthesia: General   Fluids:  Total I/O In: 1200 [I.V.:1100; IV Piggyback:100] Out: -   Complications: None  Drains:  None  Specimen: None  Disposition:  PACU - hemodynamically stable.  Plan of Care: Discharge to home after PACU  Indications for Procedure: Jesse Coleman is a 68 y.o. male who presented with a right inguinal hernia.  I recommended laparoscopic repair.  We discussed the procedure, its risks, benefits and alternatives and the patient granted consent to proceed.  Findings:  Technique: Transabdominal preperitoneal (TAPP) Hernia Location: Indirect inguinal hernia containing incarcerated retroperitoneal fatty tissue Mesh Size &Type:  Bard 3D max extra-large right sided mesh Mesh Fixation: Endo-Universal hernia stapler  Infection status: Patient: Private Patient Elective Case Case: Elective Infection Present At Time Of Surgery (PATOS): None   Description of Procedure:  The patient was positioned supine, padded and secured to the bed, with both arms tucked.  The abdomen was widely prepped and draped.  A time out procedure was performed.  A 1 cm infraumbilical incision was made.  The abdomen was entered without trauma to the underlying viscera.  The abdomen was insufflated to 15 mm of Hg.  A 12 mm trocar was inserted at the periumbilical incision.  Additional 5 mm trocars were placed in the left and right abdomen.  There was no trauma to the underlying viscera.  There was an indirect hernia on the RIGHT.  Utilizing a  transabdominal pre peritoneal technique (TAPP), a horizontal incision was made in the peritoneum, immediately below the umbilicus.  Dissection was carried out in the pre peritoneal space down to the level of the hernia sac which was reduced into the peritoneal cavity completely.  The cord contents were parietalized and preserved.  A large pre peritoneal dissection was performed to uncover the direct, indirect, femoral and obturator spaces.  Cooper's ligament was uncovered medially and the psoas muscle uncovered laterally.  The mesh, as documented above, was opened and advanced into the pre peritoneal position so that it more than adequately covered the indirect, direct, femoral and obturator spaces.  The mesh laid flat, with no inferior folds and covered the entire myopectineal orifice.  The mesh was fixated with the endo-universal hernia stapler to Cooper's ligament and the posterior aspect of the rectus muscle.  The peritoneal flap was closed with the same device.  There were no peritoneal defects or exposed mesh at the conclusion.  The umbilical trocar was removed and the fascial defect was closed with a 0 Vicryl suture.  The peritoneal cavity was completely desufflated, the trocars removed and the skin closed with 4-0 Monocryl subcuticular suture and skin glue.  All sponge and needle counts were correct at the end of the case.  Deward Foy, MD General, Bariatric, & Minimally Invasive Surgery [facility], [name]

## 2024-06-09 NOTE — Discharge Instructions (Signed)

## 2024-06-09 NOTE — H&P (Signed)
 Admitting Physician: Deward PARAS Leshae Mcclay  Service: General Surgery  CC: Right inguinal hernia  Subjective   HPI: Jesse Coleman is an 68 y.o. male who is here for hernia repair  Past Medical History:  Diagnosis Date   Arthritis    Bipolar disorder (HCC)    CAD (coronary artery disease)    Complication of anesthesia    Hard to wake up. 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   Medical therapy-1999    Past Surgical History:  Procedure Laterality Date   CARDIAC CATHETERIZATION  2014   LAD 40%, D1&D2 70-80% (1.48mm), CFX 20%, OM1 & OM2 90% (1.O mm), OM3 50%, RCA 90% (1.5-1.75 mm), EF 40-45%, med rx   COLONOSCOPY  2008   [place], [place]    CORONARY ARTERY BYPASS GRAFT N/A 09/23/2023   Procedure: CORONARY ARTERY BYPASS GRAFTING (CABG) X 4, USING LEFT INTERNAL MAMMARY ARTERY AND ENDOSCOPICALLY HARVESTED RIGHT GREATER SAPHENOUS VEIN;  Surgeon: Kerrin Elspeth BROCKS, MD;  [facility];  Service: Open Heart Surgery;  Laterality: N/A;   Dental implant  2007   HERNIA REPAIR  [address], [place]    KNEE SURGERY     LEFT HEART CATH AND CORONARY ANGIOGRAPHY N/A 09/19/2023   Procedure: LEFT HEART CATH AND CORONARY ANGIOGRAPHY;  Surgeon: Ladona Heinz, MD;  [facility] LAB;  Service: Cardiovascular;  Laterality: N/A;   PATCH ANGIOPLASTY  09/23/2023   Procedure: LEFT ANTERIOR DESCENDING ARTERY ENDARTERECTOMY, VEIN PATCH ANGIOPLASTY;  Surgeon: Kerrin Elspeth BROCKS, MD;  Location[facility];  Service: Open Heart Surgery;;   REFRACTIVE SURGERY Right 2003   TEE WITHOUT CARDIOVERSION N/A 09/23/2023   Procedure: TRANSESOPHAGEAL ECHOCARDIOGRAM (TEE);  Surgeon: Kerrin Elspeth BROCKS, MD;  Location: [facility] OR;  Service: Open Heart Surgery;  Laterality: N/A;   VASECTOMY  2003     Family History  Problem Relation Age of Onset   Coronary artery disease Father 52       sudden cardiac death   Emphysema Mother        smoked    Social:  reports that he has never smoked. He has never used smokeless tobacco. He reports current alcohol use of about 1.0 standard drink of alcohol per week. He reports that he does not use drugs.  Allergies:  Allergies  Allergen Reactions   Erythromycin Nausea And Vomiting   Latex Swelling and Other (See Comments)    Lips swell, but no breathing issues    Medications: Current Outpatient Medications  Medication Instructions   bromocriptine  (PARLODEL ) 1.25 mg, Daily at bedtime   cariprazine (VRAYLAR) 1.5 mg, Oral, Daily   clopidogrel  (PLAVIX ) 75 mg, Oral, Daily   ELIQUIS  5 MG TABS tablet TAKE 1 TABLET BY MOUTH TWICE A DAY.   EPINEPHrine  (EPI-PEN) 0.3 mg, Intramuscular, As needed   ezetimibe  (ZETIA ) 10 mg, Oral, Daily   HYDROcodone-acetaminophen  (NORCO/VICODIN) 5-325 MG tablet 1 tablet, Oral, Every 6 hours PRN   levothyroxine (SYNTHROID) 100 mcg, Oral, Daily before breakfast   LORazepam  (ATIVAN ) 1 mg, Every 8 hours PRN   metoprolol  tartrate (LOPRESSOR ) 12.5 mg, Oral, 2 times daily   nitroGLYCERIN  (NITROSTAT ) 0.4 mg, Sublingual, Every 5 min PRN   rosuvastatin  (CRESTOR ) 40 mg, Oral, Every evening   sildenafil (VIAGRA) 100 mg, As  needed   traMADol  (ULTRAM ) 50 mg, Oral, Every 6 hours PRN   vitamin B-12 (CYANOCOBALAMIN ) 100 mcg, 3 times weekly    ROS - all of the below systems have been reviewed with the patient and positives are indicated with bold text General: chills, fever or night sweats Eyes: blurry vision or double vision ENT: epistaxis or sore throat Allergy/Immunology: itchy/watery eyes or nasal congestion Hematologic/Lymphatic: bleeding problems, blood clots or swollen lymph nodes Endocrine: temperature intolerance or unexpected weight changes Breast: new or changing breast lumps or nipple discharge Resp: cough,  shortness of breath, or wheezing CV: chest pain or dyspnea on exertion GI: as per HPI GU: dysuria, trouble voiding, or hematuria MSK: joint pain or joint stiffness Neuro: TIA or stroke symptoms Derm: pruritus and skin lesion changes Psych: anxiety and depression  Objective   PE Blood pressure 134/83, pulse 63, temperature 97.8 F (36.6 C), temperature source Oral, resp. rate 15, height 5' 10 (1.778 m), weight 96.6 kg, SpO2 99%. Constitutional: NAD; conversant; no deformities Eyes: Moist conjunctiva; no lid lag; anicteric; PERRL Neck: Trachea midline; no thyromegaly Lungs: Normal respiratory effort; no tactile fremitus CV: RRR; no palpable thrills; no pitting edema GI: Abd right inguinal hernia; no palpable hepatosplenomegaly MSK: Normal range of motion of extremities; no clubbing/cyanosis Psychiatric: Appropriate affect; alert and oriented x3 Lymphatic: No palpable cervical or axillary lymphadenopathy  No results found for this or any previous visit (from the past 24 hours).  Imaging Orders  No imaging studies ordered today     Assessment and Plan   Jesse Coleman is an 68 y.o. male with a right inguinal hernia.  I recommended laparoscopic right inguinal hernia repair with mesh.  We discussed the procedure itself as well as its risks, benefits and alternatives and the patient granted consent to proceed.  Deward JINNY Foy, MD  [facility] Use [email or web address] to contact on call provider

## 2024-06-09 NOTE — Anesthesia Procedure Notes (Signed)
 Procedure Name: Intubation Date/Time: 06/09/2024 8:31 AM  Performed by: Augusta Daved SAILOR, CRNAPre-anesthesia Checklist: Patient identified, Emergency Drugs available, Suction available and Patient being monitored Patient Re-evaluated:Patient Re-evaluated prior to induction Oxygen Delivery Method: Circle System Utilized Preoxygenation: Pre-oxygenation with 100% oxygen Induction Type: IV induction Ventilation: Oral airway inserted - appropriate to patient size and Two handed mask ventilation required Laryngoscope Size: 2 and Miller Grade View: Grade I Tube type: Oral Tube size: 7.5 mm Number of attempts: 1 Airway Equipment and Method: Stylet and Oral airway Placement Confirmation: ETT inserted through vocal cords under direct vision, positive ETCO2 and breath sounds checked- equal and bilateral Secured at: 22 (at the lip) cm Tube secured with: Tape Dental Injury: Teeth and Oropharynx as per pre-operative assessment

## 2024-06-09 NOTE — Transfer of Care (Signed)
 Immediate Anesthesia Transfer of Care Note  Patient: Jesse Coleman  Procedure(s) Performed: REPAIR, HERNIA, INGUINAL, LAPAROSCOPIC (Right: Abdomen)  Patient Location: PACU  Anesthesia Type:General  Level of Consciousness: oriented, drowsy, and patient cooperative  Airway & Oxygen Therapy: Patient Spontanous Breathing and Patient connected to face mask oxygen  Post-op Assessment: Report given to RN and Post -op Vital signs reviewed and stable  Post vital signs: Reviewed and stable  Last Vitals:  Vitals Value Taken Time  BP 183/80 06/09/24 10:08  Temp    Pulse 84 06/09/24 10:10  Resp 14 06/09/24 10:10  SpO2 100 % 06/09/24 10:10  Vitals shown include unfiled device data.  Last Pain:  Vitals:   06/09/24 0652  TempSrc:   PainSc: 0-No pain         Complications: No notable events documented.

## 2024-06-09 NOTE — Anesthesia Postprocedure Evaluation (Signed)
 Anesthesia Post Note  Patient: Jesse Coleman  Procedure(s) Performed: REPAIR, HERNIA, INGUINAL, LAPAROSCOPIC (Right: Abdomen)     Patient location during evaluation: PACU Anesthesia Type: General Level of consciousness: awake and alert Pain management: pain level controlled Vital Signs Assessment: post-procedure vital signs reviewed and stable Respiratory status: spontaneous breathing, nonlabored ventilation, respiratory function stable and patient connected to nasal cannula oxygen Cardiovascular status: blood pressure returned to baseline and stable Postop Assessment: no apparent nausea or vomiting Anesthetic complications: no   No notable events documented.  Last Vitals:  Vitals:   06/09/24 1118 06/09/24 1120  BP:  (!) 181/93  Pulse:  72  Resp:    Temp: (!) (P) 36.4 C   SpO2:  97%    Last Pain:  Vitals:   06/09/24 1120  TempSrc:   PainSc: 2                  [place] Ade

## 2024-06-10 ENCOUNTER — Encounter (HOSPITAL_COMMUNITY): Payer: Self-pay | Admitting: Surgery

## 2024-07-30 ENCOUNTER — Encounter (HOSPITAL_COMMUNITY): Payer: Self-pay

## 2024-07-30 ENCOUNTER — Ambulatory Visit (HOSPITAL_COMMUNITY): Attending: Surgery

## 2024-07-30 DIAGNOSIS — Z7409 Other reduced mobility: Secondary | ICD-10-CM | POA: Insufficient documentation

## 2024-07-30 DIAGNOSIS — M6281 Muscle weakness (generalized): Secondary | ICD-10-CM | POA: Diagnosis present

## 2024-07-30 NOTE — Therapy (Signed)
 OUTPATIENT PHYSICAL THERAPY THORACOLUMBAR EVALUATION   Patient Name: Jesse Coleman MRN: [id number] DOB:1956-05-23, 68 y.o., male Today's Date: 07/30/2024  END OF SESSION:  PT End of Session - 07/30/24 0846     Visit Number 1    Number of Visits 10    Date for Recertification  09/24/24    Authorization Type [employer] Medicare    Authorization Time Period seeking auth    Progress Note Due on Visit 10    PT Start Time 0848    PT Stop Time 0945    PT Time Calculation (min) 57 min    Activity Tolerance Patient tolerated treatment well    Behavior During Therapy WFL for tasks assessed/performed          Past Medical History:  Diagnosis Date   Arthritis    Bipolar disorder (HCC)    CAD (coronary artery disease)    Complication of anesthesia    Hard to wake up. 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   Medical therapy-1999   Past Surgical History:  Procedure Laterality Date   CARDIAC CATHETERIZATION  2014   LAD 40%, D1&D2 70-80% (1.7mm), CFX 20%, OM1 & OM2 90% (1.O mm), OM3 50%, RCA 90% (1.5-1.75 mm), EF 40-45%, med rx   COLONOSCOPY  2008   [place], [place]    CORONARY ARTERY BYPASS GRAFT N/A 09/23/2023   Procedure: CORONARY ARTERY BYPASS GRAFTING (CABG) X 4, USING LEFT INTERNAL MAMMARY ARTERY AND ENDOSCOPICALLY HARVESTED RIGHT GREATER SAPHENOUS VEIN;  Surgeon: Kerrin Elspeth BROCKS, MD;  [facility]: [facility];  Service: Open Heart Surgery;  Laterality: N/A;   Dental implant  2007   HERNIA REPAIR  [address] [place]    INGUINAL HERNIA REPAIR Right 06/09/2024   Procedure: REPAIR, HERNIA, INGUINAL, LAPAROSCOPIC;  Surgeon: Lyndel Deward PARAS, MD;  Location: [facility] ORS;  Service: General;  Laterality: Right;  LAP RIGHT INGUINAL HERNIA REPAIR WITH MESH   KNEE SURGERY     LEFT HEART  CATH AND CORONARY ANGIOGRAPHY N/A 09/19/2023   Procedure: LEFT HEART CATH AND CORONARY ANGIOGRAPHY;  Surgeon: Ladona Heinz, MD;  [facility]: [facility] INVASIVE CV LAB;  Service: Cardiovascular;  Laterality: N/A;   PATCH ANGIOPLASTY  09/23/2023   Procedure: LEFT ANTERIOR DESCENDING ARTERY ENDARTERECTOMY, VEIN PATCH ANGIOPLASTY;  Surgeon: Kerrin Elspeth BROCKS, MD;  Location: [facility] OR;  Service: Open Heart Surgery;;   REFRACTIVE SURGERY Right 2003   TEE WITHOUT CARDIOVERSION N/A 09/23/2023   Procedure: TRANSESOPHAGEAL ECHOCARDIOGRAM (TEE);  Surgeon: Kerrin Elspeth BROCKS, MD;  Location: [facility];  Service: Open Heart Surgery;  Laterality: N/A;   VASECTOMY  2003   Patient Active Problem List   Diagnosis Date Noted   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   Unstable angina (HCC) 09/22/2023   Anxiety 09/21/2023   NSTEMI (non-ST elevated myocardial infarction) (HCC) 09/18/2023   History of pulmonary embolism 09/23/2019   Pulmonary embolism (HCC) 06/10/2019   Coronary artery disease involving native coronary artery of native heart without angina pectoris 05/21/2019   Ischemic cardiomyopathy 05/21/2019   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 05/21/2019   Anaphylactic shock 04/23/2019   AKI (acute kidney injury)    Hyperkalemia    Hyperlipidemia LDL goal <70 06/03/2018   Chest pain    Essential hypertension    Prolactin-secreting  pituitary adenoma (HCC)     PCP: Nichole Senior, MD  REFERRING PROVIDER: Stechschulte, Deward PARAS, MD   REFERRING DIAG: Postoperative state   Rationale for Evaluation and Treatment: Rehabilitation  THERAPY DIAG:  Muscle weakness (generalized)  Impaired functional mobility, balance, and endurance  ONSET DATE: 06/09/24  SUBJECTIVE:                                                                                                                                                                                           SUBJECTIVE STATEMENT: Patient reports that he had open hear surgery in  December 2024 and then hernia surgery about 2 months ago. He notes that he is still moving around slow since surgery, but this has been getting better. He feels that his ability to sleep is his biggest problem now. He had PT previously after his heart surgery which really helped. However, he can tell that his balance wasn't as good as it was prior to his surgeries. He has been having blurriness in his right eye since his last surgery. He has noticed that he has lost weight and muscle mass since his surgeries.   PERTINENT HISTORY:  OA, bipolar disorder, history of depression, HTN, and history of a MI  PAIN:  Are you having pain? No  PRECAUTIONS: None  RED FLAGS: None   WEIGHT BEARING RESTRICTIONS: No  FALLS:  Has patient fallen in last 6 months? No  LIVING ENVIRONMENT: Lives with: lives with their spouse Lives in: House/apartment Has following equipment at home: None  OCCUPATION: retired  PLOF: Independent  PATIENT GOALS: improved strength, mobility, and sleep (only about to sleep 5-6 hours at most most due to discomfort and pulling at surgical site)   NEXT MD VISIT: none scheduled  OBJECTIVE:  Note: Objective measures were completed at Evaluation unless otherwise noted.  PATIENT SURVEYS:  LEFS  Extreme difficulty/unable (0), Quite a bit of difficulty (1), Moderate difficulty (2), Little difficulty (3), No difficulty (4) Survey date:  07/30/24  Any of your usual work, housework or school activities 3  2. Usual hobbies, recreational or sporting activities 0  3. Getting into/out of the bath 4  4. Walking between rooms 4  5. Putting on socks/shoes 4  6. Squatting  4  7. Lifting an object, like a bag of groceries from the floor 4  8. Performing light activities around your home 4  9. Performing heavy activities around your home 0  10. Getting into/out of a car 4  11. Walking 2 blocks 4  12. Walking 1 mile 4  13. Going up/down 10 stairs (1 flight) 4  14. Standing for  1  hour 3  15.  sitting for 1 hour 3  16. Running on even ground 1  17. Running on uneven ground 0  18. Making sharp turns while running fast 0  19. Hopping  0  20. Rolling over in bed 3  Score total:  53/80     COGNITION: Overall cognitive status: Within functional limits for tasks assessed     SENSATION: Patient reports numbness in the toes of both feet (L>R)  POSTURE: No Significant postural limitations  LUMBAR ROM:   AROM eval  Flexion 25% limited  Extension WFL   Right lateral flexion WFL   Left lateral flexion WFL   Right rotation 25% limited   Left rotation 25% limited   (Blank rows = not tested)  LOWER EXTREMITY ROM:  WFL for activities assessed  LOWER EXTREMITY MMT:    MMT Right eval Left eval  Hip flexion 4-/5 4/5  Hip extension    Hip abduction    Hip adduction 5/5 5/5  Hip internal rotation    Hip external rotation    Knee flexion 4/5 4+/5  Knee extension 5/5 5/5  Ankle dorsiflexion 4+/5 4+/5  Ankle plantarflexion    Ankle inversion    Ankle eversion     (Blank rows = not tested)  FUNCTIONAL TESTS:  5 times sit to stand: 15.11 seconds 2 minute walk test: 454 feet   GAIT: Distance walked: 454 feet Assistive device utilized: None Level of assistance: Complete Independence Comments: no significant gait deviations observed  TREATMENT DATE:                                                                                                                               07/30/24: PT evaluation, patient education, and HEP    PATIENT EDUCATION:  Education details: HEP, POC, prognosis, healing, objective findings, and goals for physical therapy Person educated: Patient Education method: Explanation, Demonstration, and Handouts Education comprehension: verbalized understanding and returned demonstration  HOME EXERCISE PROGRAM: Access Code: [email or web address] URL: [email or web address] Date: 07/30/2024 Prepared by: Lacinda Fass  Exercises -  Sidelying Thoracic Rotation with Open Book  - 1 x daily - 7 x weekly - 2 sets - 10 reps - Squat with Chair Touch  - 1 x daily - 7 x weekly - 3 sets - 10 reps  ASSESSMENT:  CLINICAL IMPRESSION: Patient is a 68 y.o. male who was seen today for physical therapy evaluation and treatment for deconditioning following an inguinal hernia repair on 06/09/24. He presented with no pain or discomfort with any of today's assessments. However, he did exhibit reduced lower extremity strength and power as evidenced by his MMT and functional testing. Recommend that he continue with skilled physical therapy to address his impairments to return to his prior level of function.   OBJECTIVE IMPAIRMENTS: decreased activity tolerance, decreased mobility, and decreased strength.   ACTIVITY LIMITATIONS: sleeping, bed mobility, and locomotion level  PARTICIPATION LIMITATIONS: community activity and  yard work  PERSONAL FACTORS: Past/current experiences, Time since onset of injury/illness/exacerbation, and 3+ comorbidities: OA, bipolar disorder, history of depression, HTN, and history of a MI are also affecting patient's functional outcome.   REHAB POTENTIAL: Good  CLINICAL DECISION MAKING: Stable/uncomplicated  EVALUATION COMPLEXITY: Low   GOALS: Goals reviewed with patient? Yes  SHORT TERM GOALS: Target date: 08/13/24  Patient will be independent with his initial HEP.  Baseline: Goal status: INITIAL  2.  Patient will report being able to sleep at least 7 hours without being awakened by his familiar symptoms. Baseline:  Goal status: INITIAL  LONG TERM GOALS: Target date: 09/03/24  Patient will improve his LEFS score to at least 65/80 for improved perceived function with his daily activities.  Baseline:  Goal status: INITIAL  2.  Patient will improve his right hip and knee flexor strength to at least 4/5 for improved function with his daily activities.  Baseline:  Goal status: INITIAL  3.  Patient  will improve his five time sit to stand time to 12 seconds or less to reduce his risk of falling.  Baseline:  Goal status: INITIAL  4.  Patient will improve his distance by at least 40 feet for improved community mobility.  Baseline:  Goal status: INITIAL  PLAN:  PT FREQUENCY: 1-2x/week  PT DURATION: other: 5 weeks  PLANNED INTERVENTIONS: [id number]- PT Re-evaluation, 97750- Physical Performance Testing, 97110-Therapeutic exercises, 97530- Therapeutic activity, [id number]- Neuromuscular re-education, 97535- Self Care, [id number]- Manual therapy, [phone]- Gait training, Patient/Family education, Balance training, Stair training, Joint mobilization, Cryotherapy, and Moist heat.  PLAN FOR NEXT SESSION: review and update HEP, and lumbar and lower extremity strengthening   Lacinda JAYSON Fass, PT 07/30/2024, 10:08 AM   [facility] Medicare Auth Request Information Treatment Start Date: 07/30/24  Date of referral: 06/30/24 Referring provider: Lyndel Deward PARAS, MD  Referring diagnosis (ICD 10)? S01.109  Treatment diagnosis (ICD 10)? (if different than referring diagnosis) M62.81, Z74.09   What was this (referring dx) caused by? Surgery (Type: inguinal hernia repair )  Lysle of Condition: Initial Onset (within last 3 months)   Laterality: Rt  Current Functional Measure Score: LEFS 53/80  Objective measurements identify impairments when they are compared to normal values, the uninvolved extremity, and prior level of function.  [x]  Yes  []  No  Objective assessment of functional ability: Minimal functional limitations   Briefly describe symptoms: I want to strengthen my and get back to my daily exercise following surgery  How did symptoms start: August 2025  Average pain intensity:  Last 24 hours: 3/10  Past week: 3/10  How often does the pt experience symptoms? Occasionally  How much have the symptoms interfered with usual daily activities? Moderately  How has condition changed since care  began at this facility? NA - initial visit  In general, how is the patients overall health? Fair   BACK PAIN (STarT Back Screening Tool) No

## 2024-08-30 NOTE — Progress Notes (Unsigned)
 [facility] [address], [place] [address]   CLINIC:  Medical Oncology/Hematology  PCP:  Nichole Senior, MD [address] [place] [address] [phone]   REASON FOR VISIT:  Follow-up for history of unprovoked pulmonary embolism   CURRENT THERAPY: Eliquis   INTERVAL HISTORY:  [name] is seen today for follow-up of unprovoked pulmonary embolism.   He was last evaluated via telemedicine visit by Pleasant Barefoot PA-C on 09/01/2023.  He had quadruple bypass in December 2024. He had inguinal hernia repair in September 2025. At today's visit, he reports feeling fair.   He has 70% energy and 25% appetite.    He has not had any recurrent DVT or PE in the year since his last visit.  No unilateral leg swelling, pain, and erythema.   He denies any shortness of breath, dyspnea on exertion, chest pain, cough, hemoptysis, and palpitations.   He takes Eliquis  as prescribed without any missed doses.  He denies any rectal bleeding or melena.  ASSESSMENT & PLAN:  1.  Unprovoked pulmonary embolism: - Referred by Dr. Theophilus for unprovoked pulmonary embolism. - CT angiogram on 06/10/2019-acute bilateral extensive pulmonary thrombus, no saddle thrombus, suspicion for right heart strain.  No pulmonary infarct/effusion.  Questionable thrombus in the IVC and right inferior pulmonary vein. - 2D echo 10/18/2018 with EF 55-60%.  Right ventricle moderately reduced systolic function.  Cavity moderately enlarged. - Hypercoagulable work-up including factor V Leiden, prothrombin gene mutation, Antithrombin III , lupus anticoagulant, anticardiolipin antibody and beta-2  glycoprotein 1 antibody were negative. - His estranged son also has DVT and some abnormality with protein S. - JAK2 V617F testing was negative. - Colonoscopy in 2007 in [place]  was within normal limits.  He is a non-smoker. - Anaphylactic reaction in July 2020 (approximately 8 weeks prior to PE)  lasting for 4 days with swelling of his abdomen and lower extremities. - He is continuing to tolerate Eliquis  without any major problems.  No bleeding issues.   - Labs today (08/31/2024): D-dimer mildly elevated at 0.66, but no symptoms concerning for VTE.  Normal hemoglobin, normal kidney function. - No recurrent blood clots since being placed on Eliquis  in September 2020. - PLAN: Recommend discharge to PCP at this time.  PCP can continue Eliquis  prescription and lab monitoring.  Recommend indefinite anticoagulation.   PLAN SUMMARY: >> Discharge to PCP     REVIEW OF SYSTEMS:   Review of Systems  Constitutional:  Positive for appetite change and fatigue. Negative for chills, diaphoresis, fever and unexpected weight change.  HENT:   Negative for lump/mass and nosebleeds.   Eyes:  Negative for eye problems.  Respiratory:  Negative for cough, hemoptysis and shortness of breath.   Cardiovascular:  Negative for chest pain, leg swelling and palpitations.  Gastrointestinal:  Negative for abdominal pain, blood in stool, constipation, diarrhea, nausea and vomiting.  Genitourinary:  Positive for frequency. Negative for hematuria.   Skin: Negative.   Neurological:  Positive for numbness. Negative for dizziness, headaches and light-headedness.  Hematological:  Does not bruise/bleed easily.     PHYSICAL EXAM:  ECOG PERFORMANCE STATUS: 1 - Symptomatic but completely ambulatory  Vitals:   08/31/24 0922 08/31/24 0925  BP: (!) 147/75 (!) 143/73  Pulse: (!) 55   Resp: 17   Temp: (!) 97.5 F (36.4 C)   SpO2: 100%    Filed Weights   08/31/24 0922  Weight: 204 lb (92.5 kg)   Physical Exam Vitals reviewed.  Constitutional:  Appearance: Normal appearance.  Cardiovascular:     Rate and Rhythm: Regular rhythm.     Heart sounds: Normal heart sounds.  Pulmonary:     Breath sounds: Normal breath sounds.  Neurological:     Mental Status: He is alert.  Psychiatric:        Mood and Affect:  Mood normal.        Behavior: Behavior normal.     PAST MEDICAL/SURGICAL HISTORY:  Past Medical History:  Diagnosis Date   Arthritis    Bipolar disorder (HCC)    CAD (coronary artery disease)    Complication of anesthesia    Hard to wake up. 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   Medical therapy-1999   Past Surgical History:  Procedure Laterality Date   CARDIAC CATHETERIZATION  2014   LAD 40%, D1&D2 70-80% (1.36mm), CFX 20%, OM1 & OM2 90% (1.O mm), OM3 50%, RCA 90% (1.5-1.75 mm), EF 40-45%, med rx   COLONOSCOPY  2008   [place], [place]    CORONARY ARTERY BYPASS GRAFT N/A 09/23/2023   Procedure: CORONARY ARTERY BYPASS GRAFTING (CABG) X 4, USING LEFT INTERNAL MAMMARY ARTERY AND ENDOSCOPICALLY HARVESTED RIGHT GREATER SAPHENOUS VEIN;  Surgeon: Kerrin Elspeth BROCKS, MD;  Location: [facility] OR;  Service: Open Heart Surgery;  Laterality: N/A;   Dental implant  2007   HERNIA REPAIR  [address], [place]    INGUINAL HERNIA REPAIR Right 06/09/2024   Procedure: REPAIR, HERNIA, INGUINAL, LAPAROSCOPIC;  Surgeon: Lyndel Deward PARAS, MD;  Location: [facility] ORS;  Service: General;  Laterality: Right;  LAP RIGHT INGUINAL HERNIA REPAIR WITH MESH   KNEE SURGERY     LEFT HEART CATH AND CORONARY ANGIOGRAPHY N/A 09/19/2023   Procedure: LEFT HEART CATH AND CORONARY ANGIOGRAPHY;  Surgeon: Ladona Heinz, MD;  [facility] INVASIVE CV LAB;  Service: Cardiovascular;  Laterality: N/A;   PATCH ANGIOPLASTY  09/23/2023   Procedure: LEFT ANTERIOR DESCENDING ARTERY ENDARTERECTOMY, VEIN PATCH ANGIOPLASTY;  Surgeon: Kerrin Elspeth BROCKS, MD;  Location: [facility] OR;  Service: Open Heart Surgery;;   REFRACTIVE SURGERY Right 2003   TEE WITHOUT CARDIOVERSION N/A 09/23/2023   Procedure: TRANSESOPHAGEAL ECHOCARDIOGRAM (TEE);   Surgeon: Kerrin Elspeth BROCKS, MD;  Location: [facility] OR;  Service: Open Heart Surgery;  Laterality: N/A;   VASECTOMY  2003    SOCIAL HISTORY:  Social History   Socioeconomic History   Marital status: Married    Spouse name: Not on file   Number of children: 2   Years of education: Not on file   Highest education level: Not on file  Occupational History   Occupation: Real estate Investment Banker, Corporate: [employer]   Tobacco Use   Smoking status: Never   Smokeless tobacco: Never  Vaping Use   Vaping status: Never Used  Substance and Sexual Activity   Alcohol use: Yes    Alcohol/week: 1.0 standard drink of alcohol    Types: 1 drink(s) per week    Comment: occas   Drug use: No   Sexual activity: Not on file  Other Topics Concern   Not on file  Social History Narrative   Not on file   Social Drivers of Health   Financial Resource Strain: Low Risk  (08/08/2020)   Overall  Financial Resource Strain (CARDIA)    Difficulty of Paying Living Expenses: Not hard at all  Food Insecurity: No Food Insecurity (09/19/2023)   Hunger Vital Sign    Worried About Running Out of Food in the Last Year: Never true    Ran Out of Food in the Last Year: Never true  Transportation Needs: No Transportation Needs (09/19/2023)   PRAPARE - Administrator, Civil Service (Medical): No    Lack of Transportation (Non-Medical): No  Physical Activity: Insufficiently Active (08/08/2020)   Exercise Vital Sign    Days of Exercise per Week: 1 day    Minutes of Exercise per Session: 40 min  Stress: No Stress Concern Present (08/08/2020)   [employer] of Occupational Health - Occupational Stress Questionnaire    Feeling of Stress : Not at all  Social Connections: Moderately Integrated (09/29/2023)   Social Connection and Isolation Panel    Frequency of Communication with Friends and Family: More than three times a week    Frequency of Social Gatherings with Friends and Family: Once a week     Attends Religious Services: More than 4 times per year    Active Member of [employer] or Organizations: No    Attends Banker Meetings: Never    Marital Status: Married  Catering Manager Violence: Not At Risk (09/19/2023)   Humiliation, Afraid, Rape, and Kick questionnaire    Fear of Current or Ex-Partner: No    Emotionally Abused: No    Physically Abused: No    Sexually Abused: No    FAMILY HISTORY:  Family History  Problem Relation Age of Onset   Coronary artery disease Father 74       sudden cardiac death   Emphysema Mother        smoked    CURRENT MEDICATIONS:  Outpatient Encounter Medications as of 08/31/2024  Medication Sig Note   bromocriptine  (PARLODEL ) 2.5 MG tablet Take 1.25 mg by mouth at bedtime.     cariprazine (VRAYLAR) 1.5 MG capsule Take 1.5 mg by mouth daily.    clopidogrel  (PLAVIX ) 75 MG tablet Take 1 tablet (75 mg total) by mouth daily.    ELIQUIS  5 MG TABS tablet TAKE 1 TABLET BY MOUTH TWICE A DAY. (Patient taking differently: Take 2.5 mg by mouth daily.)    EPINEPHrine  0.3 mg/0.3 mL IJ SOAJ injection Inject 0.3 mLs (0.3 mg total) into the muscle as needed for anaphylaxis.    ezetimibe  (ZETIA ) 10 MG tablet Take 10 mg by mouth daily.    HYDROcodone-acetaminophen  (NORCO/VICODIN) 5-325 MG tablet Take 1 tablet by mouth every 6 (six) hours as needed for moderate pain (pain score 4-6).    levothyroxine (SYNTHROID) 100 MCG tablet Take 100 mcg by mouth daily before breakfast.    lisinopril  (ZESTRIL ) 5 MG tablet TAKE 1 TABLET BY MOUTH EVERY DAY; Duration: 90    LORazepam  (ATIVAN ) 1 MG tablet Take 1 mg by mouth every 8 (eight) hours as needed for anxiety.    metoprolol  tartrate (LOPRESSOR ) 25 MG tablet Take 0.5 tablets (12.5 mg total) by mouth 2 (two) times daily. (Patient taking differently: Take 12.5 mg by mouth every evening.)    oxyCODONE -acetaminophen  (PERCOCET) 5-325 MG tablet Take 1 tablet by mouth every 4 (four) hours as needed for severe pain (pain score  7-10).    sildenafil (VIAGRA) 100 MG tablet Take 100 mg by mouth as needed for erectile dysfunction.    thiamine (VITAMIN B1) 100 MG tablet Take 100 mg by  mouth.    vitamin B-12 (CYANOCOBALAMIN ) 100 MCG tablet Take 100 mcg by mouth 3 (three) times a week.    [DISCONTINUED] nitroGLYCERIN  (NITROSTAT ) 0.4 MG SL tablet Place 1 tablet (0.4 mg total) under the tongue every 5 (five) minutes as needed for chest pain. 06/09/2024: 09-23-2023   [DISCONTINUED] rosuvastatin  (CRESTOR ) 40 MG tablet Take 1 tablet (40 mg total) by mouth every evening. (Patient not taking: Reported on 05/21/2024)    [DISCONTINUED] traMADol  (ULTRAM ) 50 MG tablet Take 1 tablet (50 mg total) by mouth every 6 (six) hours as needed for severe pain (pain score 7-10). (Patient not taking: Reported on 05/21/2024)    No facility-administered encounter medications on file as of 08/31/2024.    ALLERGIES:  Allergies  Allergen Reactions   Erythromycin Nausea And Vomiting   Latex Swelling and Other (See Comments)    Lips swell, but no breathing issues    LABORATORY DATA:  I have reviewed the labs as listed.  CBC    Component Value Date/Time   WBC 4.8 08/31/2024 0816   RBC 4.74 08/31/2024 0816   HGB 14.7 08/31/2024 0816   HCT 45.0 08/31/2024 0816   PLT 154 08/31/2024 0816   MCV 94.9 08/31/2024 0816   MCH 31.0 08/31/2024 0816   MCHC 32.7 08/31/2024 0816   RDW 13.2 08/31/2024 0816   LYMPHSABS 1.9 08/31/2024 0816   MONOABS 0.5 08/31/2024 0816   EOSABS 0.1 08/31/2024 0816   BASOSABS 0.0 08/31/2024 0816      Latest Ref Rng & Units 08/31/2024    8:16 AM 05/25/2024   11:59 AM 09/29/2023    3:19 AM  CMP  Glucose 70 - 99 mg/dL 87  77  896   BUN 8 - 23 mg/dL 13  12  10    Creatinine 0.61 - 1.24 mg/dL 8.99  8.93  9.01   Sodium 135 - 145 mmol/L 140  141  133   Potassium 3.5 - 5.1 mmol/L 4.0  4.7  3.8   Chloride 98 - 111 mmol/L 106  107  98   CO2 22 - 32 mmol/L 27  25  26    Calcium  8.9 - 10.3 mg/dL 9.8  9.8  8.7   Total Protein 6.5 -  8.1 g/dL 6.5     Total Bilirubin 0.0 - 1.2 mg/dL 0.5     Alkaline Phos 38 - 126 U/L 83     AST 15 - 41 U/L 27     ALT 0 - 44 U/L 8       DIAGNOSTIC IMAGING:  I have independently reviewed the relevant imaging and discussed with the patient.   WRAP UP:  All questions were answered. The patient knows to call the clinic with any problems, questions or concerns.  Medical decision making: Moderate  Time spent on visit: I spent 20 minutes counseling the patient face to face. The total time spent in the appointment was 30 minutes and more than 50% was on counseling.  Pleasant CHRISTELLA Barefoot, PA-C  08/31/24 10:03 AM

## 2024-08-31 ENCOUNTER — Inpatient Hospital Stay: Payer: Medicare Other | Attending: Physician Assistant

## 2024-08-31 ENCOUNTER — Inpatient Hospital Stay: Payer: Medicare Other | Admitting: Physician Assistant

## 2024-08-31 VITALS — BP 143/73 | HR 55 | Temp 97.5°F | Resp 17 | Ht 70.0 in | Wt 204.0 lb

## 2024-08-31 DIAGNOSIS — Z7901 Long term (current) use of anticoagulants: Secondary | ICD-10-CM | POA: Diagnosis not present

## 2024-08-31 DIAGNOSIS — Z86711 Personal history of pulmonary embolism: Secondary | ICD-10-CM | POA: Insufficient documentation

## 2024-08-31 DIAGNOSIS — I2699 Other pulmonary embolism without acute cor pulmonale: Secondary | ICD-10-CM

## 2024-08-31 LAB — COMPREHENSIVE METABOLIC PANEL WITH GFR
ALT: 8 U/L (ref 0–44)
AST: 27 U/L (ref 15–41)
Albumin: 4.3 g/dL (ref 3.5–5.0)
Alkaline Phosphatase: 83 U/L (ref 38–126)
Anion gap: 7 (ref 5–15)
BUN: 13 mg/dL (ref 8–23)
CO2: 27 mmol/L (ref 22–32)
Calcium: 9.8 mg/dL (ref 8.9–10.3)
Chloride: 106 mmol/L (ref 98–111)
Creatinine, Ser: 1 mg/dL (ref 0.61–1.24)
GFR, Estimated: >60 mL/min
Glucose, Bld: 87 mg/dL (ref 70–99)
Potassium: 4 mmol/L (ref 3.5–5.1)
Sodium: 140 mmol/L (ref 135–145)
Total Bilirubin: 0.5 mg/dL (ref 0.0–1.2)
Total Protein: 6.5 g/dL (ref 6.5–8.1)

## 2024-08-31 LAB — CBC WITH DIFFERENTIAL/PLATELET
Abs Immature Granulocytes: 0 K/uL (ref 0.00–0.07)
Basophils Absolute: 0 K/uL (ref 0.0–0.1)
Basophils Relative: 0 %
Eosinophils Absolute: 0.1 K/uL (ref 0.0–0.5)
Eosinophils Relative: 3 %
HCT: 45 % (ref 39.0–52.0)
Hemoglobin: 14.7 g/dL (ref 13.0–17.0)
Immature Granulocytes: 0 %
Lymphocytes Relative: 39 %
Lymphs Abs: 1.9 K/uL (ref 0.7–4.0)
MCH: 31 pg (ref 26.0–34.0)
MCHC: 32.7 g/dL (ref 30.0–36.0)
MCV: 94.9 fL (ref 80.0–100.0)
Monocytes Absolute: 0.5 K/uL (ref 0.1–1.0)
Monocytes Relative: 11 %
Neutro Abs: 2.2 K/uL (ref 1.7–7.7)
Neutrophils Relative %: 47 %
Platelets: 154 K/uL (ref 150–400)
RBC: 4.74 MIL/uL (ref 4.22–5.81)
RDW: 13.2 % (ref 11.5–15.5)
WBC: 4.8 K/uL (ref 4.0–10.5)
nRBC: 0 % (ref 0.0–0.2)

## 2024-08-31 LAB — D-DIMER, QUANTITATIVE: D-Dimer, Quant: 0.66 ug{FEU}/mL — ABNORMAL HIGH (ref 0.00–0.50)

## 2024-08-31 NOTE — Patient Instructions (Signed)
 [facility] at [facility] **VISIT SUMMARY & IMPORTANT INSTRUCTIONS **   You were seen today by Pleasant Barefoot PA-C for your follow-up visit.    HISTORY OF PULMONARY EMBOLISM Since the blood clot in your lungs was unprovoked, we recommend that you remain on indefinite blood thinner with Eliquis . At this time, you do not have any symptoms concerning for a new blood clot. Since you are overall stable, we recommend that you follow-up with your primary care provider going forward.  Your primary care provider can prescribe your Eliquis  and check your labs once a year. You do not need another appointment here at the hematology clinic [facility]), unless you have a new breakthrough blood clot despite being on Eliquis , or are considering stopping Eliquis .   ** Thank you for trusting me with your healthcare!  I strive to provide all of my patients with quality care at each visit.  If you receive a survey for this visit, I would be so grateful to you for taking the time to provide feedback.  Thank you in advance!  ~ Shakyia Bosso                                        Dr. Mickiel Davonna Pleasant Barefoot, PA-C        Delon Hope, NP   - - - - - - - - - - - - - - - - - -    Thank you for choosing [facility] at [facility] to provide your oncology and hematology care.  To afford each patient quality time with our provider, please arrive at least 15 minutes before your scheduled appointment time.   If you have a lab appointment with the Cancer Center please come in thru the Main Entrance and check in at the main information desk.  You need to re-schedule your appointment should you arrive 10 or more minutes late.  We strive to give you quality time with our providers, and arriving late affects you and other patients whose appointments are after yours.  Also, if you no show three or more times for appointments you may be dismissed from the clinic at  the providers discretion.     Again, thank you for choosing [facility].  Our hope is that these requests will decrease the amount of time that you wait before being seen by our physicians.       _____________________________________________________________  Should you have questions after your visit to [facility], please contact our office at [phone] and follow the prompts.  Our office hours are 8:00 a.m. and 4:30 p.m. Monday - Friday.  Please note that voicemails left after 4:00 p.m. may not be returned until the following business day.  We are closed weekends and major holidays.  You do have access to a nurse 24-7, just call the main number to the clinic [phone] and do not press any options, hold on the line and a nurse will answer the phone.    For prescription refill requests, have your pharmacy contact our office and allow 72 hours.

## 2024-09-01 ENCOUNTER — Ambulatory Visit (HOSPITAL_COMMUNITY)

## 2024-09-08 ENCOUNTER — Ambulatory Visit (HOSPITAL_COMMUNITY): Attending: Surgery | Admitting: Physical Therapy

## 2024-09-08 DIAGNOSIS — M6281 Muscle weakness (generalized): Secondary | ICD-10-CM | POA: Diagnosis present

## 2024-09-08 DIAGNOSIS — Z7409 Other reduced mobility: Secondary | ICD-10-CM | POA: Diagnosis present

## 2024-09-08 NOTE — Therapy (Signed)
 OUTPATIENT PHYSICAL THERAPY THORACOLUMBAR TREATMENT   Patient Name: Jesse Coleman MRN: [id number] DOB:October 10, 1955, 68 y.o., male Today's Date: 09/08/2024  END OF SESSION:  PT End of Session - 09/08/24 1254     Visit Number 2    Number of Visits 10    Date for Recertification  09/24/24    Authorization Type U[employer] Medicare    Authorization Time Period seeking shara (was approved 10 visits 10/31-12/05; today is outside of that)    Progress Note Due on Visit 10    PT Start Time 1035    PT Stop Time 1118    PT Time Calculation (min) 43 min    Activity Tolerance Patient tolerated treatment well    Behavior During Therapy WFL for tasks assessed/performed           Past Medical History:  Diagnosis Date   Arthritis    Bipolar disorder (HCC)    CAD (coronary artery disease)    Complication of anesthesia    Hard to wake up. 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   Medical therapy-1999   Past Surgical History:  Procedure Laterality Date   CARDIAC CATHETERIZATION  2014   LAD 40%, D1&D2 70-80% (1.43mm), CFX 20%, OM1 & OM2 90% (1.O mm), OM3 50%, RCA 90% (1.5-1.75 mm), EF 40-45%, med rx   COLONOSCOPY  2008   [place], [place]    CORONARY ARTERY BYPASS GRAFT N/A 09/23/2023   Procedure: CORONARY ARTERY BYPASS GRAFTING (CABG) X 4, USING LEFT INTERNAL MAMMARY ARTERY AND ENDOSCOPICALLY HARVESTED RIGHT GREATER SAPHENOUS VEIN;  Surgeon: Kerrin Elspeth BROCKS, MD;  Location: [facility] OR;  Service: Open Heart Surgery;  Laterality: N/A;   Dental implant  2007   HERNIA REPAIR  [address], [place]    INGUINAL HERNIA REPAIR Right 06/09/2024   Procedure: REPAIR, HERNIA, INGUINAL, LAPAROSCOPIC;  Surgeon: Lyndel Deward PARAS, MD;  Location: [facility] ORS;  Service: General;  Laterality: Right;  LAP RIGHT  INGUINAL HERNIA REPAIR WITH MESH   KNEE SURGERY     LEFT HEART CATH AND CORONARY ANGIOGRAPHY N/A 09/19/2023   Procedure: LEFT HEART CATH AND CORONARY ANGIOGRAPHY;  Surgeon: Ladona Heinz, MD;  Location: [facility] INVASIVE CV LAB;  Service: Cardiovascular;  Laterality: N/A;   PATCH ANGIOPLASTY  09/23/2023   Procedure: LEFT ANTERIOR DESCENDING ARTERY ENDARTERECTOMY, VEIN PATCH ANGIOPLASTY;  Surgeon: Kerrin Elspeth BROCKS, MD;  Location: [facility] OR;  Service: Open Heart Surgery;;   REFRACTIVE SURGERY Right 2003   TEE WITHOUT CARDIOVERSION N/A 09/23/2023   Procedure: TRANSESOPHAGEAL ECHOCARDIOGRAM (TEE);  Surgeon: Kerrin Elspeth BROCKS, MD;  Location: [facility] OR;  Service: Open Heart Surgery;  Laterality: N/A;   VASECTOMY  2003   Patient Active Problem List   Diagnosis Date Noted   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   Unstable angina (HCC) 09/22/2023   Anxiety 09/21/2023   NSTEMI (non-ST elevated myocardial infarction) (HCC) 09/18/2023   History of pulmonary embolism 09/23/2019   Pulmonary embolism (HCC) 06/10/2019   Coronary artery disease involving native coronary artery of native heart without angina pectoris 05/21/2019   Ischemic cardiomyopathy 05/21/2019   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 05/21/2019   Anaphylactic shock 04/23/2019   AKI (acute kidney injury)    Hyperkalemia    Hyperlipidemia LDL goal <70 06/03/2018  Chest pain    Essential hypertension    Prolactin-secreting pituitary adenoma (HCC)     PCP: Nichole Senior, MD  REFERRING PROVIDER: Stechschulte, Deward PARAS, MD   REFERRING DIAG: Postoperative state   Rationale for Evaluation and Treatment: Rehabilitation  THERAPY DIAG:  Muscle weakness (generalized)  Impaired functional mobility, balance, and endurance  ONSET DATE: 06/09/24  SUBJECTIVE:                                                                                                                                                                                            SUBJECTIVE STATEMENT: Pt states he has not been here due to conflicts with his other appointments.  Presents with frustrations due to lack of vision in Rt eye since the surgery.  Otherwise, he is doing well and completing his HEP.  Evaluation: Patient reports that he had open heart surgery in December 2024 and then hernia surgery about 2 months ago. He notes that he is still moving around slow since surgery, but this has been getting better. He feels that his ability to sleep is his biggest problem now. He had PT previously after his heart surgery which really helped. However, he can tell that his balance wasn't as good as it was prior to his surgeries. He has been having blurriness in his right eye since his last surgery. He has noticed that he has lost weight and muscle mass since his surgeries.   PERTINENT HISTORY:  OA, bipolar disorder, history of depression, HTN, and history of a MI  PAIN:  Are you having pain? No  PRECAUTIONS: None  RED FLAGS: None   WEIGHT BEARING RESTRICTIONS: No  FALLS:  Has patient fallen in last 6 months? No  LIVING ENVIRONMENT: Lives with: lives with their spouse Lives in: House/apartment Has following equipment at home: None  OCCUPATION: retired  PLOF: Independent  PATIENT GOALS: improved strength, mobility, and sleep (only about to sleep 5-6 hours at most most due to discomfort and pulling at surgical site)   NEXT MD VISIT: none scheduled  OBJECTIVE:  Note: Objective measures were completed at Evaluation unless otherwise noted.  PATIENT SURVEYS:  LEFS  Extreme difficulty/unable (0), Quite a bit of difficulty (1), Moderate difficulty (2), Little difficulty (3), No difficulty (4) Survey date:  07/30/24 09/08/24  Any of your usual work, housework or school activities 3   2. Usual hobbies, recreational or sporting activities 0   3. Getting into/out of the bath 4   4. Walking between rooms 4   5. Putting on socks/shoes 4   6. Squatting  4    7. Lifting an object,  like a bag of groceries from the floor 4   8. Performing light activities around your home 4   9. Performing heavy activities around your home 0   10. Getting into/out of a car 4   11. Walking 2 blocks 4   12. Walking 1 mile 4   13. Going up/down 10 stairs (1 flight) 4   14. Standing for 1 hour 3   15.  sitting for 1 hour 3   16. Running on even ground 1   17. Running on uneven ground 0   18. Making sharp turns while running fast 0   19. Hopping  0   20. Rolling over in bed 3   Score total:  53/80 : 56 / 80 = 70.0 %     COGNITION: Overall cognitive status: Within functional limits for tasks assessed     SENSATION: Patient reports numbness in the toes of both feet (L>R)  POSTURE: No Significant postural limitations  LUMBAR ROM:   AROM eval  Flexion 25% limited  Extension WFL   Right lateral flexion WFL   Left lateral flexion WFL   Right rotation 25% limited   Left rotation 25% limited   (Blank rows = not tested)  LOWER EXTREMITY ROM:  WFL for activities assessed  LOWER EXTREMITY MMT:    MMT Right eval Left eval  Hip flexion 4-/5 4/5  Hip extension    Hip abduction    Hip adduction 5/5 5/5  Hip internal rotation    Hip external rotation    Knee flexion 4/5 4+/5  Knee extension 5/5 5/5  Ankle dorsiflexion 4+/5 4+/5  Ankle plantarflexion    Ankle inversion    Ankle eversion     (Blank rows = not tested)  FUNCTIONAL TESTS:  5 times sit to stand: 15.11 seconds 2 minute walk test: 454 feet   GAIT: Distance walked: 454 feet Assistive device utilized: None Level of assistance: Complete Independence Comments: no significant gait deviations observed  TREATMENT DATE:                                                                                                                               09/08/24 Goal review LEFS: : 56 / 80 = 70.0 % Standing:  GTB 2X10 scap retraction  GTB 2X10 Rows  GTB palloff press outs core stab  2X10 Standing heelraises 20X  Alternating high march hold   07/30/24: PT evaluation, patient education, and HEP    PATIENT EDUCATION:  Education details: HEP, POC, prognosis, healing, objective findings, and goals for physical therapy Person educated: Patient Education method: Explanation, Demonstration, and Handouts Education comprehension: verbalized understanding and returned demonstration  HOME EXERCISE PROGRAM: Access Code: [email or web address] URL: [email or web address] Date: 07/30/2024 Prepared by: Lacinda Fass  Exercises - Sidelying Thoracic Rotation with Open Book  - 1 x daily - 7 x weekly - 2 sets - 10 reps - Squat with Chair Touch  - 1 x  daily - 7 x weekly - 3 sets - 10 reps  ASSESSMENT:  CLINICAL IMPRESSION: Pt returns today following 1.5 months since evaluation.  States he had conflicting appts and was unable to come, however continued with 2 exercises he was given for HEP and overall doing better.  Completed LEFS with slight improvement compared to initial evaluation and reviewed goals.  Educated on core recruitment/contraction and utilized during session today.  Began with postural strengthening using theraband as well as palloff activity.  Added these and given green band with instruction for use in door jam at home . Completed several LE strengthening exericses in standing today as well.  Pt tolerated well without complaints of pain or losses of balance.     OBJECTIVE IMPAIRMENTS: decreased activity tolerance, decreased mobility, and decreased strength.   ACTIVITY LIMITATIONS: sleeping, bed mobility, and locomotion level  PARTICIPATION LIMITATIONS: community activity and yard work  PERSONAL FACTORS: Past/current experiences, Time since onset of injury/illness/exacerbation, and 3+ comorbidities: OA, bipolar disorder, history of depression, HTN, and history of a MI are also affecting patient's functional outcome.   REHAB POTENTIAL: Good  CLINICAL DECISION  MAKING: Stable/uncomplicated  EVALUATION COMPLEXITY: Low   GOALS: Goals reviewed with patient? Yes  SHORT TERM GOALS: Target date: 08/13/24  Patient will be independent with his initial HEP.  Baseline: Goal status: MET  2.  Patient will report being able to sleep at least 7 hours without being awakened by his familiar symptoms. Baseline: 09/08/24:  3-4 hours Goal status: IN PROGRESS  LONG TERM GOALS: Target date: 09/03/24  Patient will improve his LEFS score to at least 65/80 for improved perceived function with his daily activities.  Baseline: : 09/08/24: 56 / 80 = 70.0 % Goal status: IN PROGRESS  2.  Patient will improve his right hip and knee flexor strength to at least 4/5 for improved function with his daily activities.  Baseline:  Goal status: IN PROGRESS  3.  Patient will improve his five time sit to stand time to 12 seconds or less to reduce his risk of falling.  Baseline:  Goal status: IN PROGRESS  4.  Patient will improve his distance by at least 40 feet for improved community mobility.  Baseline:  Goal status: IN PROGRESS  PLAN:  PT FREQUENCY: 1-2x/week  PT DURATION: other: 5 weeks  PLANNED INTERVENTIONS: 97164- PT Re-evaluation, 97750- Physical Performance Testing, 97110-Therapeutic exercises, 97530- Therapeutic activity, [id number]- Neuromuscular re-education, 97535- Self Care, 02859- Manual therapy, [phone]- Gait training, Patient/Family education, Balance training, Stair training, Joint mobilization, Cryotherapy, and Moist heat.  PLAN FOR NEXT SESSION: progress lower extremity strengthening and core stabilization.   Next session progress with more static balance activities progressing to dynamic.  Begin lunges, vectors, etc.   Vivian Greig NOVAK, PTA 09/08/2024, 12:55 PM   [employer] Medicare Auth Request Information Treatment Start Date: 07/30/24  Date of referral: 06/30/24 Referring provider: Stechschulte, Deward PARAS, MD  Referring diagnosis (ICD 10)? S01.109   Treatment diagnosis (ICD 10)? (if different than referring diagnosis) M62.81, Z74.09   What was this (referring dx) caused by? Surgery (Type: inguinal hernia repair )  Lysle of Condition: Initial Onset (within last 3 months)   Laterality: Rt  Current Functional Measure Score: : LEFS:  56 / 80 = 70.0 %  Objective measurements identify impairments when they are compared to normal values, the uninvolved extremity, and prior level of function.  [x]  Yes  []  No  Objective assessment of functional ability: Minimal functional limitations   Briefly describe  symptoms: I want to strengthen my legs and core to return to my daily exercise following surgery  How did symptoms start: August 2025 following surgery  Average pain intensity:  Last 24 hours: 0/10  Past week: 3/10  How often does the pt experience symptoms? Occasionally  How much have the symptoms interfered with usual daily activities? Moderately  How has condition changed since care began at this facility? NA - initial visit  In general, how is the patients overall health? Fair   BACK PAIN (STarT Back Screening Tool) No

## 2024-09-14 ENCOUNTER — Ambulatory Visit (HOSPITAL_COMMUNITY): Admitting: Physical Therapy

## 2024-09-16 ENCOUNTER — Ambulatory Visit (HOSPITAL_COMMUNITY): Admitting: Physical Therapy

## 2024-09-16 DIAGNOSIS — M6281 Muscle weakness (generalized): Secondary | ICD-10-CM | POA: Diagnosis not present

## 2024-09-16 DIAGNOSIS — Z7409 Other reduced mobility: Secondary | ICD-10-CM

## 2024-09-16 NOTE — Therapy (Signed)
 OUTPATIENT PHYSICAL THERAPY THORACOLUMBAR TREATMENT   Patient Name: Jesse Coleman MRN: [id number] DOB:April 17, 1956, 68 y.o., male Today's Date: 09/16/2024  END OF SESSION:  PT End of Session - 09/16/24 1202     Visit Number 3    Number of Visits 10    Date for Recertification  09/24/24    Authorization Type [employer] Medicare    Authorization Time Period seeking shara (was approved 10 visits 10/31-12/05; today is outside of that)    Progress Note Due on Visit 10    PT Start Time 1119    PT Stop Time 1200    PT Time Calculation (min) 41 min    Activity Tolerance Patient tolerated treatment well    Behavior During Therapy WFL for tasks assessed/performed            Past Medical History:  Diagnosis Date   Arthritis    Bipolar disorder (HCC)    CAD (coronary artery disease)    Complication of anesthesia    Hard to wake up. 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   Medical therapy-1999   Past Surgical History:  Procedure Laterality Date   CARDIAC CATHETERIZATION  2014   LAD 40%, D1&D2 70-80% (1.4mm), CFX 20%, OM1 & OM2 90% (1.O mm), OM3 50%, RCA 90% (1.5-1.75 mm), EF 40-45%, med rx   COLONOSCOPY  2008   [place], [place]    CORONARY ARTERY BYPASS GRAFT N/A 09/23/2023   Procedure: CORONARY ARTERY BYPASS GRAFTING (CABG) X 4, USING LEFT INTERNAL MAMMARY ARTERY AND ENDOSCOPICALLY HARVESTED RIGHT GREATER SAPHENOUS VEIN;  Surgeon: Kerrin Elspeth BROCKS, MD;  Location: [facility] OR;  Service: Open Heart Surgery;  Laterality: N/A;   Dental implant  2007   HERNIA REPAIR  [address] [place]    INGUINAL HERNIA REPAIR Right 06/09/2024   Procedure: REPAIR, HERNIA, INGUINAL, LAPAROSCOPIC;  Surgeon: Lyndel Deward PARAS, MD;  Location: [facility] ORS;  Service: General;  Laterality: Right;  LAP RIGHT  INGUINAL HERNIA REPAIR WITH MESH   KNEE SURGERY     LEFT HEART CATH AND CORONARY ANGIOGRAPHY N/A 09/19/2023   Procedure: LEFT HEART CATH AND CORONARY ANGIOGRAPHY;  Surgeon: Ladona Heinz, MD;  Location: [facility] INVASIVE CV LAB;  Service: Cardiovascular;  Laterality: N/A;   PATCH ANGIOPLASTY  09/23/2023   Procedure: LEFT ANTERIOR DESCENDING ARTERY ENDARTERECTOMY, VEIN PATCH ANGIOPLASTY;  Surgeon: Kerrin Elspeth BROCKS, MD;  Location: [facility] OR;  Service: Open Heart Surgery;;   REFRACTIVE SURGERY Right 2003   TEE WITHOUT CARDIOVERSION N/A 09/23/2023   Procedure: TRANSESOPHAGEAL ECHOCARDIOGRAM (TEE);  Surgeon: Kerrin Elspeth BROCKS, MD;  Location: [facility] OR;  Service: Open Heart Surgery;  Laterality: N/A;   VASECTOMY  2003   Patient Active Problem List   Diagnosis Date Noted   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   Unstable angina (HCC) 09/22/2023   Anxiety 09/21/2023   NSTEMI (non-ST elevated myocardial infarction) (HCC) 09/18/2023   History of pulmonary embolism 09/23/2019   Pulmonary embolism (HCC) 06/10/2019   Coronary artery disease involving native coronary artery of native heart without angina pectoris 05/21/2019   Ischemic cardiomyopathy 05/21/2019   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 05/21/2019   Anaphylactic shock 04/23/2019   AKI (acute kidney injury)    Hyperkalemia    Hyperlipidemia LDL goal <70 06/03/2018  Chest pain    Essential hypertension    Prolactin-secreting pituitary adenoma (HCC)     PCP: Nichole Senior, MD  REFERRING PROVIDER: Stechschulte, Deward PARAS, MD   REFERRING DIAG: Postoperative state   Rationale for Evaluation and Treatment: Rehabilitation  THERAPY DIAG:  Muscle weakness (generalized)  Impaired functional mobility, balance, and endurance  ONSET DATE: 06/09/24  SUBJECTIVE:                                                                                                                                                                                            SUBJECTIVE STATEMENT: Pt reports no pain no issues, doing his HEP and using other equipment he had at home.    Evaluation: Patient reports that he had open heart surgery in December 2024 and then hernia surgery about 2 months ago. He notes that he is still moving around slow since surgery, but this has been getting better. He feels that his ability to sleep is his biggest problem now. He had PT previously after his heart surgery which really helped. However, he can tell that his balance wasn't as good as it was prior to his surgeries. He has been having blurriness in his right eye since his last surgery. He has noticed that he has lost weight and muscle mass since his surgeries.   PERTINENT HISTORY:  OA, bipolar disorder, history of depression, HTN, and history of a MI  PAIN:  Are you having pain? No  PRECAUTIONS: None  RED FLAGS: None   WEIGHT BEARING RESTRICTIONS: No  FALLS:  Has patient fallen in last 6 months? No  LIVING ENVIRONMENT: Lives with: lives with their spouse Lives in: House/apartment Has following equipment at home: None  OCCUPATION: retired  PLOF: Independent  PATIENT GOALS: improved strength, mobility, and sleep (only about to sleep 5-6 hours at most most due to discomfort and pulling at surgical site)   NEXT MD VISIT: none scheduled  OBJECTIVE:  Note: Objective measures were completed at Evaluation unless otherwise noted.  PATIENT SURVEYS:  LEFS  Extreme difficulty/unable (0), Quite a bit of difficulty (1), Moderate difficulty (2), Little difficulty (3), No difficulty (4) Survey date:  07/30/24 09/08/24  Any of your usual work, housework or school activities 3   2. Usual hobbies, recreational or sporting activities 0   3. Getting into/out of the bath 4   4. Walking between rooms 4   5. Putting on socks/shoes 4   6. Squatting  4   7. Lifting an object, like a bag of groceries from the floor 4   8. Performing light activities around your home 4  9. Performing heavy activities around your home 0   10. Getting into/out of a car 4   11. Walking 2 blocks 4   12. Walking 1 mile 4   13. Going up/down 10 stairs (1 flight) 4   14. Standing for 1 hour 3   15.  sitting for 1 hour 3   16. Running on even ground 1   17. Running on uneven ground 0   18. Making sharp turns while running fast 0   19. Hopping  0   20. Rolling over in bed 3   Score total:  53/80 : 56 / 80 = 70.0 %     COGNITION: Overall cognitive status: Within functional limits for tasks assessed     SENSATION: Patient reports numbness in the toes of both feet (L>R)  POSTURE: No Significant postural limitations  LUMBAR ROM:   AROM eval  Flexion 25% limited  Extension WFL   Right lateral flexion WFL   Left lateral flexion WFL   Right rotation 25% limited   Left rotation 25% limited   (Blank rows = not tested)  LOWER EXTREMITY ROM:  WFL for activities assessed  LOWER EXTREMITY MMT:    MMT Right eval Left eval  Hip flexion 4-/5 4/5  Hip extension    Hip abduction    Hip adduction 5/5 5/5  Hip internal rotation    Hip external rotation    Knee flexion 4/5 4+/5  Knee extension 5/5 5/5  Ankle dorsiflexion 4+/5 4+/5  Ankle plantarflexion    Ankle inversion    Ankle eversion     (Blank rows = not tested)  FUNCTIONAL TESTS:  5 times sit to stand: 15.11 seconds 2 minute walk test: 454 feet   GAIT: Distance walked: 454 feet Assistive device utilized: None Level of assistance: Complete Independence Comments: no significant gait deviations observed  TREATMENT DATE:                                                                                                                               09/16/24 Standing:  heelraises 2X10  Alternating high march holds 2X10  SLS max holds without UE Rt: 27, Lt: 15  Vectors 5X5 each side with 1 UE assist  Forward lunges onto 4 step  Bodycraft leg press 2X10 3 pl Nustep EOS UE/LE level 3 seat 10, 6  minutes    09/08/24 Goal review LEFS: : 56 / 80 = 70.0 % Standing:  GTB 2X10 scap retraction  GTB 2X10 Rows  GTB palloff press outs core stab 2X10 Standing heelraises 20X  Alternating high march hold   07/30/24: PT evaluation, patient education, and HEP    PATIENT EDUCATION:  Education details: HEP, POC, prognosis, healing, objective findings, and goals for physical therapy Person educated: Patient Education method: Explanation, Demonstration, and Handouts Education comprehension: verbalized understanding and returned demonstration  HOME EXERCISE PROGRAM: Access Code: [email or web address] URL: [email or web address] Date: 07/30/2024 Prepared by: Lacinda  Barts  Exercises - Sidelying Thoracic Rotation with Open Book  - 1 x daily - 7 x weekly - 2 sets - 10 reps - Squat with Chair Touch  - 1 x daily - 7 x weekly - 3 sets - 10 reps  ASSESSMENT:  CLINICAL IMPRESSION: Pt doing well overall without symtoms, being compliant with HEP. Continued with LE strengthening and stabilization exercises.  Progressed these today with lunges and leg press.  Pt originally only able to maintain SLS for 6 seconds prior to needing UE assist but after several tries able to maintain 25 seconds.  Pt did rely on UE movements and weight shifting to maintain.  Added vector activity to help improve glut stab.  Finished session on nustep.  Remained pain free.      OBJECTIVE IMPAIRMENTS: decreased activity tolerance, decreased mobility, and decreased strength.   ACTIVITY LIMITATIONS: sleeping, bed mobility, and locomotion level  PARTICIPATION LIMITATIONS: community activity and yard work  PERSONAL FACTORS: Past/current experiences, Time since onset of injury/illness/exacerbation, and 3+ comorbidities: OA, bipolar disorder, history of depression, HTN, and history of a MI are also affecting patient's functional outcome.   REHAB POTENTIAL: Good  CLINICAL DECISION MAKING: Stable/uncomplicated  EVALUATION  COMPLEXITY: Low   GOALS: Goals reviewed with patient? Yes  SHORT TERM GOALS: Target date: 08/13/24  Patient will be independent with his initial HEP.  Baseline: Goal status: MET  2.  Patient will report being able to sleep at least 7 hours without being awakened by his familiar symptoms. Baseline: 09/08/24:  3-4 hours Goal status: IN PROGRESS  LONG TERM GOALS: Target date: 09/03/24  Patient will improve his LEFS score to at least 65/80 for improved perceived function with his daily activities.  Baseline: : 09/08/24: 56 / 80 = 70.0 % Goal status: IN PROGRESS  2.  Patient will improve his right hip and knee flexor strength to at least 4/5 for improved function with his daily activities.  Baseline:  Goal status: IN PROGRESS  3.  Patient will improve his five time sit to stand time to 12 seconds or less to reduce his risk of falling.  Baseline:  Goal status: IN PROGRESS  4.  Patient will improve his distance by at least 40 feet for improved community mobility.  Baseline:  Goal status: IN PROGRESS  PLAN:  PT FREQUENCY: 1-2x/week  PT DURATION: other: 5 weeks  PLANNED INTERVENTIONS: 97164- PT Re-evaluation, 97750- Physical Performance Testing, 97110-Therapeutic exercises, 97530- Therapeutic activity, [id number]- Neuromuscular re-education, 97535- Self Care, 02859- Manual therapy, [phone]- Gait training, Patient/Family education, Balance training, Stair training, Joint mobilization, Cryotherapy, and Moist heat.  PLAN FOR NEXT SESSION: progress lower extremity strengthening and core stabilization.   Next session progress with more dynamic balance activities adding balance beam and side stepping with bands.    Vivian No B, PTA 09/16/2024, 12:03 PM

## 2024-09-21 ENCOUNTER — Ambulatory Visit (HOSPITAL_COMMUNITY): Admitting: Physical Therapy

## 2024-09-21 DIAGNOSIS — M6281 Muscle weakness (generalized): Secondary | ICD-10-CM

## 2024-09-21 DIAGNOSIS — Z7409 Other reduced mobility: Secondary | ICD-10-CM

## 2024-09-21 NOTE — Therapy (Signed)
 " OUTPATIENT PHYSICAL THERAPY THORACOLUMBAR TREATMENT   Patient Name: Jesse Coleman MRN: [id number] DOB:January 13, 1956, 68 y.o., male Today's Date: 09/21/2024  END OF SESSION:  PT End of Session - 09/21/24 1122     Visit Number 5    Number of Visits 10    Date for Recertification  09/24/24    Authorization Type [employer]HC Medicare    Authorization Time Period 6 visits 12/5-01/9/26    Authorization - Visit Number 4    Authorization - Number of Visits 6    Progress Note Due on Visit 10    PT Start Time 1119    PT Stop Time 1200    PT Time Calculation (min) 41 min    Activity Tolerance Patient tolerated treatment well    Behavior During Therapy WFL for tasks assessed/performed            Past Medical History:  Diagnosis Date   Arthritis    Bipolar disorder (HCC)    CAD (coronary artery disease)    Complication of anesthesia    Hard to wake up. 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   Medical therapy-1999   Past Surgical History:  Procedure Laterality Date   CARDIAC CATHETERIZATION  2014   LAD 40%, D1&D2 70-80% (1.23mm), CFX 20%, OM1 & OM2 90% (1.O mm), OM3 50%, RCA 90% (1.5-1.75 mm), EF 40-45%, med rx   COLONOSCOPY  2008   [place], [place]    CORONARY ARTERY BYPASS GRAFT N/A 09/23/2023   Procedure: CORONARY ARTERY BYPASS GRAFTING (CABG) X 4, USING LEFT INTERNAL MAMMARY ARTERY AND ENDOSCOPICALLY HARVESTED RIGHT GREATER SAPHENOUS VEIN;  Surgeon: Kerrin Elspeth BROCKS, MD;  Location: [facility] OR;  Service: Open Heart Surgery;  Laterality: N/A;   Dental implant  2007   HERNIA REPAIR  [address], [place]    INGUINAL HERNIA REPAIR Right 06/09/2024   Procedure: REPAIR, HERNIA, INGUINAL, LAPAROSCOPIC;  Surgeon: Lyndel Deward PARAS, MD;  Location: [facility] ORS;  Service: General;   Laterality: Right;  LAP RIGHT INGUINAL HERNIA REPAIR WITH MESH   KNEE SURGERY     LEFT HEART CATH AND CORONARY ANGIOGRAPHY N/A 09/19/2023   Procedure: LEFT HEART CATH AND CORONARY ANGIOGRAPHY;  Surgeon: Ladona Heinz, MD;  [facility]: [facility] INVASIVE CV LAB;  Service: Cardiovascular;  Laterality: N/A;   PATCH ANGIOPLASTY  09/23/2023   Procedure: LEFT ANTERIOR DESCENDING ARTERY ENDARTERECTOMY, VEIN PATCH ANGIOPLASTY;  Surgeon: Kerrin Elspeth BROCKS, MD;  Location: [facility] OR;  Service: Open Heart Surgery;;   REFRACTIVE SURGERY Right 2003   TEE WITHOUT CARDIOVERSION N/A 09/23/2023   Procedure: TRANSESOPHAGEAL ECHOCARDIOGRAM (TEE);  Surgeon: Kerrin Elspeth BROCKS, MD;  Location: [facility] OR;  Service: Open Heart Surgery;  Laterality: N/A;   VASECTOMY  2003   Patient Active Problem List   Diagnosis Date Noted   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   Unstable angina (HCC) 09/22/2023   Anxiety 09/21/2023   NSTEMI (non-ST elevated myocardial infarction) (HCC) 09/18/2023   History of pulmonary embolism 09/23/2019   Pulmonary embolism (HCC) 06/10/2019   Coronary artery disease involving native coronary artery of native heart without angina pectoris 05/21/2019   Ischemic cardiomyopathy 05/21/2019   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 05/21/2019   Anaphylactic shock 04/23/2019   AKI (acute kidney injury)    Hyperkalemia  Hyperlipidemia LDL goal <70 06/03/2018   Chest pain    Essential hypertension    Prolactin-secreting pituitary adenoma (HCC)     PCP: Nichole Senior, MD  REFERRING PROVIDER: Stechschulte, Deward PARAS, MD   REFERRING DIAG: Postoperative state   Rationale for Evaluation and Treatment: Rehabilitation  THERAPY DIAG:  Muscle weakness (generalized)  Impaired functional mobility, balance, and endurance  ONSET DATE: 06/09/24  SUBJECTIVE:                                                                                                                                                                                            SUBJECTIVE STATEMENT: Pt reports no pain.  States he can tell an improvement in his balance by increased ease in putting on his pants/ ability to stand on one leg.      Evaluation: Patient reports that he had open heart surgery in December 2024 and then hernia surgery about 2 months ago. He notes that he is still moving around slow since surgery, but this has been getting better. He feels that his ability to sleep is his biggest problem now. He had PT previously after his heart surgery which really helped. However, he can tell that his balance wasn't as good as it was prior to his surgeries. He has been having blurriness in his right eye since his last surgery. He has noticed that he has lost weight and muscle mass since his surgeries.   PERTINENT HISTORY:  OA, bipolar disorder, history of depression, HTN, and history of a MI  PAIN:  Are you having pain? No  PRECAUTIONS: None  RED FLAGS: None   WEIGHT BEARING RESTRICTIONS: No  FALLS:  Has patient fallen in last 6 months? No  LIVING ENVIRONMENT: Lives with: lives with their spouse Lives in: House/apartment Has following equipment at home: None  OCCUPATION: retired  PLOF: Independent  PATIENT GOALS: improved strength, mobility, and sleep (only about to sleep 5-6 hours at most most due to discomfort and pulling at surgical site)   NEXT MD VISIT: none scheduled  OBJECTIVE:  Note: Objective measures were completed at Evaluation unless otherwise noted.  PATIENT SURVEYS:  LEFS  Extreme difficulty/unable (0), Quite a bit of difficulty (1), Moderate difficulty (2), Little difficulty (3), No difficulty (4) Survey date:  07/30/24 09/08/24  Any of your usual work, housework or school activities 3   2. Usual hobbies, recreational or sporting activities 0   3. Getting into/out of the bath 4   4. Walking between rooms 4   5. Putting on socks/shoes 4   6. Squatting  4   7. Lifting an object, like  a bag of groceries  from the floor 4   8. Performing light activities around your home 4   9. Performing heavy activities around your home 0   10. Getting into/out of a car 4   11. Walking 2 blocks 4   12. Walking 1 mile 4   13. Going up/down 10 stairs (1 flight) 4   14. Standing for 1 hour 3   15.  sitting for 1 hour 3   16. Running on even ground 1   17. Running on uneven ground 0   18. Making sharp turns while running fast 0   19. Hopping  0   20. Rolling over in bed 3   Score total:  53/80 : 56 / 80 = 70.0 %     COGNITION: Overall cognitive status: Within functional limits for tasks assessed     SENSATION: Patient reports numbness in the toes of both feet (L>R)  POSTURE: No Significant postural limitations  LUMBAR ROM:   AROM eval  Flexion 25% limited  Extension WFL   Right lateral flexion WFL   Left lateral flexion WFL   Right rotation 25% limited   Left rotation 25% limited   (Blank rows = not tested)  LOWER EXTREMITY ROM:  WFL for activities assessed  LOWER EXTREMITY MMT:    MMT Right eval Left eval  Hip flexion 4-/5 4/5  Hip extension    Hip abduction    Hip adduction 5/5 5/5  Hip internal rotation    Hip external rotation    Knee flexion 4/5 4+/5  Knee extension 5/5 5/5  Ankle dorsiflexion 4+/5 4+/5  Ankle plantarflexion    Ankle inversion    Ankle eversion     (Blank rows = not tested)  FUNCTIONAL TESTS:  5 times sit to stand: 15.11 seconds 2 minute walk test: 454 feet   GAIT: Distance walked: 454 feet Assistive device utilized: None Level of assistance: Complete Independence Comments: no significant gait deviations observed  TREATMENT DATE:                                                                                                                               09/21/24 Standing:  heelraises 2X10  6 forward step ups with 1 UE assist 2X10  6 lateral step downs with 1 UE 2X10 each  Vectors 5X5 each side with 1 UE assist  Forward lunges onto 4 step  2X10 Sidestepping squats with GTB around thighs 20 foot line 2RT Airex balance beam lateral stepping 2RT, tandem 2RT Bodycraft leg press 2X10 4 pl Nustep EOS UE/LE level 3 seat 10, 6 minutes  09/16/24 Standing:  heelraises 2X10  Alternating high march holds 2X10  SLS max holds without UE Rt: 27, Lt: 15  Vectors 5X5 each side with 1 UE assist  Forward lunges onto 4 step  Bodycraft leg press 2X10 3 pl Nustep EOS UE/LE level 3 seat 10, 6 minutes  09/08/24 Goal review LEFS: : 56 / 80 = 70.0 % Standing:  GTB 2X10 scap retraction  GTB 2X10 Rows  GTB palloff press outs core stab 2X10 Standing heelraises 20X  Alternating high march hold   07/30/24: PT evaluation, patient education, and HEP    PATIENT EDUCATION:  Education details: HEP, POC, prognosis, healing, objective findings, and goals for physical therapy Person educated: Patient Education method: Explanation, Demonstration, and Handouts Education comprehension: verbalized understanding and returned demonstration  HOME EXERCISE PROGRAM: Access Code: [email or web address] URL: [email or web address] Date: 07/30/2024 Prepared by: Lacinda Fass  Exercises - Sidelying Thoracic Rotation with Open Book  - 1 x daily - 7 x weekly - 2 sets - 10 reps - Squat with Chair Touch  - 1 x daily - 7 x weekly - 3 sets - 10 reps  ASSESSMENT:  CLINICAL IMPRESSION: Focus remains on LE strength, core stabilization and improving balance.  Progressed to dynamic balance activities on airex balance beam and also completed resisted side stepping with squats. No gross loss of balance noted, several incidences of using UE to establish balance, mostly with lateral stepping.  Increased to 4 plates with leg press and additional set was added.  Pt able to complete all activities today without pain and with minimal cues for form.  Pt is overall improving and progressing well towards goals.        OBJECTIVE IMPAIRMENTS: decreased activity tolerance,  decreased mobility, and decreased strength.   ACTIVITY LIMITATIONS: sleeping, bed mobility, and locomotion level  PARTICIPATION LIMITATIONS: community activity and yard work  PERSONAL FACTORS: Past/current experiences, Time since onset of injury/illness/exacerbation, and 3+ comorbidities: OA, bipolar disorder, history of depression, HTN, and history of a MI are also affecting patient's functional outcome.   REHAB POTENTIAL: Good  CLINICAL DECISION MAKING: Stable/uncomplicated  EVALUATION COMPLEXITY: Low   GOALS: Goals reviewed with patient? Yes  SHORT TERM GOALS: Target date: 08/13/24  Patient will be independent with his initial HEP.  Baseline: Goal status: MET  2.  Patient will report being able to sleep at least 7 hours without being awakened by his familiar symptoms. Baseline: 09/08/24:  3-4 hours Goal status: IN PROGRESS  LONG TERM GOALS: Target date: 09/03/24  Patient will improve his LEFS score to at least 65/80 for improved perceived function with his daily activities.  Baseline: : 09/08/24: 56 / 80 = 70.0 % Goal status: IN PROGRESS  2.  Patient will improve his right hip and knee flexor strength to at least 4/5 for improved function with his daily activities.  Baseline:  Goal status: IN PROGRESS  3.  Patient will improve his five time sit to stand time to 12 seconds or less to reduce his risk of falling.  Baseline:  Goal status: IN PROGRESS  4.  Patient will improve his distance by at least 40 feet for improved community mobility.  Baseline:  Goal status: IN PROGRESS  PLAN:  PT FREQUENCY: 1-2x/week  PT DURATION: other: 5 weeks  PLANNED INTERVENTIONS: 97164- PT Re-evaluation, 97750- Physical Performance Testing, 97110-Therapeutic exercises, 97530- Therapeutic activity, [id number]- Neuromuscular re-education, 97535- Self Care, 02859- Manual therapy, [phone]- Gait training, Patient/Family education, Balance training, Stair training, Joint mobilization,  Cryotherapy, and Moist heat.  PLAN FOR NEXT SESSION: progress lower extremity strengthening and core stabilization.    Vivian No B, PTA 09/21/2024, 11:23 AM   "

## 2024-09-27 ENCOUNTER — Encounter: Payer: Self-pay | Admitting: *Deleted

## 2024-09-28 ENCOUNTER — Encounter (HOSPITAL_COMMUNITY): Payer: Self-pay

## 2024-09-28 ENCOUNTER — Ambulatory Visit (HOSPITAL_COMMUNITY)

## 2024-09-28 DIAGNOSIS — Z7409 Other reduced mobility: Secondary | ICD-10-CM

## 2024-09-28 DIAGNOSIS — M6281 Muscle weakness (generalized): Secondary | ICD-10-CM | POA: Diagnosis not present

## 2024-09-28 NOTE — Therapy (Signed)
 " OUTPATIENT PHYSICAL THERAPY THORACOLUMBAR TREATMENT   Patient Name: Jesse Coleman MRN: [id number] DOB:1956/06/14, 68 y.o., male Today's Date: 09/28/2024  END OF SESSION:  PT End of Session - 09/28/24 1115     Visit Number 6    Number of Visits 10    Date for Recertification  09/24/24    Authorization Type [employer]    Authorization Time Period 6 visits 12/5-01/9/26    Authorization - Visit Number 5    Authorization - Number of Visits 6    Progress Note Due on Visit 10    PT Start Time 1115    PT Stop Time 1148    PT Time Calculation (min) 33 min    Activity Tolerance Patient tolerated treatment well    Behavior During Therapy WFL for tasks assessed/performed             Past Medical History:  Diagnosis Date   Arthritis    Bipolar disorder (HCC)    CAD (coronary artery disease)    Complication of anesthesia    Hard to wake up. 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   Medical therapy-1999   Past Surgical History:  Procedure Laterality Date   CARDIAC CATHETERIZATION  2014   LAD 40%, D1&D2 70-80% (1.19mm), CFX 20%, OM1 & OM2 90% (1.O mm), OM3 50%, RCA 90% (1.5-1.75 mm), EF 40-45%, med rx   COLONOSCOPY  2008   [place], [place]    CORONARY ARTERY BYPASS GRAFT N/A 09/23/2023   Procedure: CORONARY ARTERY BYPASS GRAFTING (CABG) X 4, USING LEFT INTERNAL MAMMARY ARTERY AND ENDOSCOPICALLY HARVESTED RIGHT GREATER SAPHENOUS VEIN;  Surgeon: Kerrin Elspeth BROCKS, MD;  Location: [facility] OR;  Service: Open Heart Surgery;  Laterality: N/A;   Dental implant  2007   HERNIA REPAIR  [address], [place]    INGUINAL HERNIA REPAIR Right 06/09/2024   Procedure: REPAIR, HERNIA, INGUINAL, LAPAROSCOPIC;  Surgeon: Lyndel Deward PARAS, MD;  Location: [facility] ORS;  Service: General;   Laterality: Right;  LAP RIGHT INGUINAL HERNIA REPAIR WITH MESH   KNEE SURGERY     LEFT HEART CATH AND CORONARY ANGIOGRAPHY N/A 09/19/2023   Procedure: LEFT HEART CATH AND CORONARY ANGIOGRAPHY;  Surgeon: Ladona Heinz, MD;  Location: [facility] INVASIVE CV LAB;  Service: Cardiovascular;  Laterality: N/A;   PATCH ANGIOPLASTY  09/23/2023   Procedure: LEFT ANTERIOR DESCENDING ARTERY ENDARTERECTOMY, VEIN PATCH ANGIOPLASTY;  Surgeon: Kerrin Elspeth BROCKS, MD;  Location: [facility] OR;  Service: Open Heart Surgery;;   REFRACTIVE SURGERY Right 2003   TEE WITHOUT CARDIOVERSION N/A 09/23/2023   Procedure: TRANSESOPHAGEAL ECHOCARDIOGRAM (TEE);  Surgeon: Kerrin Elspeth BROCKS, MD;  Location: [facility] OR;  Service: Open Heart Surgery;  Laterality: N/A;   VASECTOMY  2003   Patient Active Problem List   Diagnosis Date Noted   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   Unstable angina (HCC) 09/22/2023   Anxiety 09/21/2023   NSTEMI (non-ST elevated myocardial infarction) (HCC) 09/18/2023   History of pulmonary embolism 09/23/2019   Pulmonary embolism (HCC) 06/10/2019   Coronary artery disease involving native coronary artery of native heart without angina pectoris 05/21/2019   Ischemic cardiomyopathy 05/21/2019   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 05/21/2019   Anaphylactic shock 04/23/2019   AKI (acute kidney injury)  Hyperkalemia    Hyperlipidemia LDL goal <70 06/03/2018   Chest pain    Essential hypertension    Prolactin-secreting pituitary adenoma (HCC)     PCP: Nichole Senior, MD  REFERRING PROVIDER: Stechschulte, Deward PARAS, MD   REFERRING DIAG: Postoperative state   Rationale for Evaluation and Treatment: Rehabilitation  THERAPY DIAG:  Muscle weakness (generalized)  Impaired functional mobility, balance, and endurance  ONSET DATE: 06/09/24  SUBJECTIVE:                                                                                                                                                                                            SUBJECTIVE STATEMENT: Patient reports that he feels good today.   Evaluation: Patient reports that he had open heart surgery in December 2024 and then hernia surgery about 2 months ago. He notes that he is still moving around slow since surgery, but this has been getting better. He feels that his ability to sleep is his biggest problem now. He had PT previously after his heart surgery which really helped. However, he can tell that his balance wasn't as good as it was prior to his surgeries. He has been having blurriness in his right eye since his last surgery. He has noticed that he has lost weight and muscle mass since his surgeries.   PERTINENT HISTORY:  OA, bipolar disorder, history of depression, HTN, and history of a MI  PAIN:  Are you having pain? No  PRECAUTIONS: None  RED FLAGS: None   WEIGHT BEARING RESTRICTIONS: No  FALLS:  Has patient fallen in last 6 months? No  LIVING ENVIRONMENT: Lives with: lives with their spouse Lives in: House/apartment Has following equipment at home: None  OCCUPATION: retired  PLOF: Independent  PATIENT GOALS: improved strength, mobility, and sleep (only about to sleep 5-6 hours at most most due to discomfort and pulling at surgical site)   NEXT MD VISIT: none scheduled  OBJECTIVE:  Note: Objective measures were completed at Evaluation unless otherwise noted.  PATIENT SURVEYS:  LEFS  Extreme difficulty/unable (0), Quite a bit of difficulty (1), Moderate difficulty (2), Little difficulty (3), No difficulty (4) Survey date:  07/30/24 09/08/24 09/28/24  Any of your usual work, housework or school activities 3  4  2. Usual hobbies, recreational or sporting activities 0  0  3. Getting into/out of the bath 4  4  4. Walking between rooms 4  4  5. Putting on socks/shoes 4  4  6. Squatting  4  4  7. Lifting an object, like a bag of groceries from the floor 4  4  8. Performing  light activities around your home 4  4  9.  Performing heavy activities around your home 0  4  10. Getting into/out of a car 4  4  11. Walking 2 blocks 4  4  12. Walking 1 mile 4  4  13. Going up/down 10 stairs (1 flight) 4  4  14. Standing for 1 hour 3  4  15.  sitting for 1 hour 3  4  16. Running on even ground 1  1  17. Running on uneven ground 0  0  18. Making sharp turns while running fast 0  0  19. Hopping  0  0  20. Rolling over in bed 3  4  Score total:  53/80 : 56 / 80 = 70.0 % 61/80     COGNITION: Overall cognitive status: Within functional limits for tasks assessed     SENSATION: Patient reports numbness in the toes of both feet (L>R)  POSTURE: No Significant postural limitations  LUMBAR ROM:   AROM eval  Flexion 25% limited  Extension WFL   Right lateral flexion WFL   Left lateral flexion WFL   Right rotation 25% limited   Left rotation 25% limited   (Blank rows = not tested)  LOWER EXTREMITY ROM:  WFL for activities assessed  LOWER EXTREMITY MMT:    MMT Right eval Right 09/28/24 Left eval  Hip flexion 4-/5 4/5 4/5  Hip extension     Hip abduction     Hip adduction 5/5  5/5  Hip internal rotation     Hip external rotation     Knee flexion 4/5 4+/5 4+/5  Knee extension 5/5  5/5  Ankle dorsiflexion 4+/5  4+/5  Ankle plantarflexion     Ankle inversion     Ankle eversion      (Blank rows = not tested)  FUNCTIONAL TESTS:  5 times sit to stand: 15.11 seconds 2 minute walk test: 454 feet   GAIT: Distance walked: 454 feet Assistive device utilized: None Level of assistance: Complete Independence Comments: no significant gait deviations observed  TREATMENT DATE:                                                                                                                                                                 09/28/24 EXERCISE LOG  Exercise Repetitions and Resistance Comments  Lunges onto BOSU  Ball up x 1 minutes each    Rocker board   1.5 minutes each  AP and lateral;  without UE support   Stairs  1 x 4 steps  1 railing  Goal assessment  See below        Blank cell = exercise not performed today   09/21/24 Standing:  heelraises 2X10  6 forward step  ups with 1 UE assist 2X10  6 lateral step downs with 1 UE 2X10 each  Vectors 5X5 each side with 1 UE assist  Forward lunges onto 4 step 2X10 Sidestepping squats with GTB around thighs 20 foot line 2RT Airex balance beam lateral stepping 2RT, tandem 2RT Bodycraft leg press 2X10 4 pl Nustep EOS UE/LE level 3 seat 10, 6 minutes  09/16/24 Standing:  heelraises 2X10  Alternating high march holds 2X10  SLS max holds without UE Rt: 27, Lt: 15  Vectors 5X5 each side with 1 UE assist  Forward lunges onto 4 step  Bodycraft leg press 2X10 3 pl Nustep EOS UE/LE level 3 seat 10, 6 minutes  PATIENT EDUCATION:  Education details: HEP, progress with therapy, and benefits of exercise Person educated: Patient Education method: Explanation and Handouts Education comprehension: verbalized understanding  HOME EXERCISE PROGRAM: Access Code: [email or web address] URL: [email or web address] Date: 09/28/2024 Prepared by: Lacinda Fass  Exercises - Side Stepping with Resistance at Thighs  - 1 x daily - 7 x weekly - 3 sets - 10 reps - Side Stepping with Resistance at Ankles  - 1 x daily - 7 x weekly - 3 sets - 10 reps - Band Walks  - 1 x daily - 7 x weekly - 3 sets - 10 reps - Standard Lunge  - 1 x daily - 7 x weekly - 3 sets - 10 reps - Tandem Stance  - 1 x daily - 7 x weekly - 3 sets - 10 reps - Marching with Resistance  - 1 x daily - 7 x weekly - 3 sets - 10 reps  Access Code: [email or web address] URL: [email or web address] Date: 07/30/2024 Prepared by: Lacinda Fass  Exercises - Sidelying Thoracic Rotation with Open Book  - 1 x daily - 7 x weekly - 2 sets - 10 reps - Squat with Chair Touch  - 1 x daily - 7 x weekly - 3 sets - 10 reps  ASSESSMENT:  CLINICAL IMPRESSION: Patient has made  excellent progress with skilled physical therapy as evidenced by his subjective reports, objective measures, functional mobility, and progress toward his goals. He was able to meet or nearly meet all of his goals for physical therapy. He was able to demonstrate a significant improvement in his lower extremity muscular strength, five time sit to stand time, and two minute walk test distance. He was able to achieve improvements in his LEFS score and report improved sleep without being awakened by his familiar symptoms. However, he was unable to meet these goals. His HEP was reviewed and updated. He reported feeling comfortable with these interventions. He reported feeling comfortable being discharged at this time.   PHYSICAL THERAPY DISCHARGE SUMMARY  Visits from Start of Care: 6  Current functional level related to goals / functional outcomes: Patient was able to meet most of his goals for skilled physical therapy.    Remaining deficits: None    Education / Equipment: HEP   Patient agrees to discharge. Patient goals were partially met. Patient is being discharged due to being pleased with the current functional level.       OBJECTIVE IMPAIRMENTS: decreased activity tolerance, decreased mobility, and decreased strength.   ACTIVITY LIMITATIONS: sleeping, bed mobility, and locomotion level  PARTICIPATION LIMITATIONS: community activity and yard work  PERSONAL FACTORS: Past/current experiences, Time since onset of injury/illness/exacerbation, and 3+ comorbidities: OA, bipolar disorder, history of depression, HTN, and history of a MI are also affecting patient's functional outcome.   REHAB POTENTIAL: Good  CLINICAL DECISION MAKING: Stable/uncomplicated  EVALUATION COMPLEXITY: Low   GOALS: Goals reviewed with patient? Yes  SHORT TERM GOALS: Target date: 08/13/24  Patient will be independent with his initial HEP.  Baseline: Goal status: MET  2.  Patient will report being able to  sleep at least 7 hours without being awakened by his familiar symptoms. Baseline: 09/08/24:  3-4 hours 09/28/24: 6 hours Goal status: NEARLY MET  LONG TERM GOALS: Target date: 09/03/24  Patient will improve his LEFS score to at least 65/80 for improved perceived function with his daily activities.  Baseline: : 09/08/24: 56 / 80 = 70.0 % 09/28/24: 61/80 Goal status: NEARLY MET   2.  Patient will improve his right hip and knee flexor strength to at least 4/5 for improved function with his daily activities.  Baseline:  Goal status: MET  3.  Patient will improve his five time sit to stand time to 12 seconds or less to reduce his risk of falling.  Baseline: 8.53 seconds Goal status: MET  4.  Patient will improve his distance by at least 40 feet for improved community mobility.  Baseline: 596 feet on 09/28/24 Goal status: MET   PLAN:  PT FREQUENCY: 1-2x/week  PT DURATION: other: 5 weeks  PLANNED INTERVENTIONS: 97164- PT Re-evaluation, 97750- Physical Performance Testing, 97110-Therapeutic exercises, 97530- Therapeutic activity, [id number]- Neuromuscular re-education, 97535- Self Care, 02859- Manual therapy, [phone]- Gait training, Patient/Family education, Balance training, Stair training, Joint mobilization, Cryotherapy, and Moist heat.  PLAN FOR NEXT SESSION: progress lower extremity strengthening and core stabilization.    Lacinda JAYSON Fass, PT 09/28/2024, 12:09 PM   "

## 2024-10-05 ENCOUNTER — Ambulatory Visit (HOSPITAL_COMMUNITY)

## 2024-10-06 ENCOUNTER — Other Ambulatory Visit: Payer: Self-pay | Admitting: Family Medicine

## 2024-10-06 DIAGNOSIS — R222 Localized swelling, mass and lump, trunk: Secondary | ICD-10-CM

## 2024-10-07 ENCOUNTER — Ambulatory Visit (HOSPITAL_COMMUNITY)

## 2024-10-12 ENCOUNTER — Ambulatory Visit (HOSPITAL_COMMUNITY)

## 2024-10-12 ENCOUNTER — Encounter (HOSPITAL_BASED_OUTPATIENT_CLINIC_OR_DEPARTMENT_OTHER): Payer: Self-pay | Admitting: Family

## 2024-10-12 ENCOUNTER — Ambulatory Visit (HOSPITAL_BASED_OUTPATIENT_CLINIC_OR_DEPARTMENT_OTHER): Admitting: Family

## 2024-10-12 VITALS — BP 118/64 | HR 65 | Ht 70.0 in | Wt 203.3 lb

## 2024-10-12 DIAGNOSIS — Z86711 Personal history of pulmonary embolism: Secondary | ICD-10-CM

## 2024-10-12 DIAGNOSIS — D6859 Other primary thrombophilia: Secondary | ICD-10-CM

## 2024-10-12 DIAGNOSIS — I25118 Atherosclerotic heart disease of native coronary artery with other forms of angina pectoris: Secondary | ICD-10-CM | POA: Diagnosis not present

## 2024-10-12 DIAGNOSIS — E785 Hyperlipidemia, unspecified: Secondary | ICD-10-CM | POA: Diagnosis not present

## 2024-10-12 MED ORDER — APIXABAN 2.5 MG PO TABS: 2.5000 mg | ORAL_TABLET | Freq: Two times a day (BID) | ORAL | 3 refills | Status: AC

## 2024-10-12 NOTE — Patient Instructions (Signed)
 Medication Instructions:   CHANGE Eliquis  one (1) tablet by mouth ( 2.5 mg) twice daily.   *If you need a refill on your cardiac medications before your next appointment, please call your pharmacy*  Lab Work:  None ordered.  If you have labs (blood work) drawn today and your tests are completely normal, you will receive your results only by: MyChart Message (if you have MyChart) OR A paper copy in the mail If you have any lab test that is abnormal or we need to change your treatment, we will call you to review the results.  Testing/Procedures:  None ordered.  Follow-Up: At [facility], you and your health needs are our priority.  As part of our continuing mission to provide you with exceptional heart care, our providers are all part of one team.  This team includes your primary Cardiologist (physician) and Advanced Practice Providers or APPs (Physician Assistants and Nurse Practitioners) who all work together to provide you with the care you need, when you need it.  Your next appointment:   1 year(s)  Provider:   Reche Finder, NP    We recommend signing up for the patient portal called MyChart.  Sign up information is provided on this After Visit Summary.  MyChart is used to connect with patients for Virtual Visits (Telemedicine).  Patients are able to view lab/test results, encounter notes, upcoming appointments, etc.  Non-urgent messages can be sent to your provider as well.   To learn more about what you can do with MyChart, go to [email or web address].   Other Instructions  Your physician wants you to follow-up in: 1 year.  You will receive a reminder letter in the mail two months in advance. If you don't receive a letter, please call our office to schedule the follow-up appointment.

## 2024-10-12 NOTE — Progress Notes (Signed)
 " Cardiology Office Note:  .   Date:  10/12/2024  ID:  Jesse Coleman, DOB Apr 28, 1956, MRN [id number] PCP: Nichole Senior, MD  [facility] Providers Cardiologist:  Oneil Parchment, MD Cardiology APP:  Vannie Reche RAMAN, NP    History of Present Illness: Jesse   Haider Coleman is a 69 y.o. male with hx of CAD s/p CABG x4 09/23/23, HTN, HLD, ICM, pituitary adenoma managed medically, unprovoked PE 2020 on OAC with hypercoagulable workup unremarkable, postoperative atrial fibrillation, LBBB.   Initial cardiac workup 2013 due to chest pain. LHC May 2014 showed a small caliber, non-dominant RCA, diagonal, and obtuse marginal which were not amenable to PCI and was recommended for medical management. Prior intolerance to Amlodipine with LE edema. Echo 06/2019 LVEF 66-60%, moderate biatrial enlargement.   Admission 12/19-12/31/24 with NSTEMI and on 09/23/23 underwent CABG x4 along with endarterectomy of of LAD with vein patch and angioplasty. He did develop blurriness and quadrant vision loss in right eye with MRI no acute intracranial process. 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 CTA no concern in major arteries of head/neck. There was mediastinal hematoma adjacant to LIMA graft. Has post operative atrial fibrillation and put on Amiodarone . Plan to consider outpatient DCCV if did not self-convert. Echo during admission LVEF 50-55% (prio 2014 LVEF 40-45%).   Presents today for follow up. Reports episode of mid-sternal chest pain with reassuring EKG  by PCP and diagnosis of musculoskeletal chest pain. This has improved with Tylenol . Checks blood pressure at home with readings 120s/70s. No palpitations. Reports no shortness of breath nor dyspnea on exertion. No orthopnea, PND. Reports no palpitations.  Eats predominantly at home. Previous intolerant to Rosuvastatin  with myalgias and swelling. 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 Reports he is taking Eliquis  5mg   daily.  ROS: Please see the history of present illness.    All other systems reviewed and are negative.   Studies Reviewed: .        Cardiac Studies & Procedures   ______________________________________________________________________________________________ CARDIAC CATHETERIZATION  CARDIAC CATHETERIZATION 09/19/2023  Conclusion Images from the original result were not included. Left Heart Catheterization 09/19/23: Hemodynamic data: LV 122/0, EDP 10 mmHg.  Ao 108/61, mean 79 mmHg.  No pressure gradient across the aortic valve.  Angiographic data: Severe coronary calcification of all the coronary arteries. LV: Marked global hypokinesis.  LVEF 25 to at most 30%. LM: Calcified but large-caliber vessel.  No luminal obstruction. LAD: Severely calcified throughout.  Proximal LAD has eccentric 70 to 80% stenosis. Small D1 and D2 with high-grade ostial stenosis of 90% and diffuse disease.  Moderate size D3 with ostial 99% stenosis.  LAD CTO after D3 origin.  Distal LAD collateralized by ipsilateral and contralateral collaterals.  Distal LAD appears bypassable. LCx: Dominant.  Moderate disease in the ostium and proximal segment of 40%.  Very tiny OM1 and 2 with severe disease, large OM 3 with ostial 90% stenosis.  Distal CX has mild disease. RI: CTO in the ostium.  Ipsilateral collaterals.  The vessel faintly and appears to be small to moderate caliber vessel however large distribution. RCA: Nondominant with diffuse disease with a proximal 90% focal stenosis.  Gives collaterals to distal LAD.    Impression and recommendations: Severe diffuse calcific three-vessel coronary artery disease.  Severely reduced LVEF.  Will refer for CT surgery for consideration for CABG.  Findings Coronary Findings Diagnostic  Dominance: Left  Left Main Dist LM to Prox LAD lesion is 80% stenosed.  Left  Anterior Descending Collaterals Dist LAD filled by collaterals from RV Branch.  Collaterals Dist LAD  filled by collaterals from Acute Mrg.  Collaterals Dist LAD filled by collaterals from Dist RCA.  Mid LAD lesion is 95% stenosed. Mid LAD to Dist LAD lesion is 100% stenosed.  First Diagonal Branch Vessel is small in size. There is severe disease in the vessel.  Second Diagonal Branch There is moderate disease in the vessel.  Third Diagonal Branch 3rd Diag lesion is 95% stenosed.  Ramus Intermedius Collaterals Ramus filled by collaterals from Mid LAD.  Collaterals Ramus filled by collaterals from Mid LAD.  Collaterals Ramus filled by collaterals from 3rd Mrg.  Ramus lesion is 100% stenosed.  Left Circumflex Ost Cx to Mid Cx lesion is 40% stenosed.  First Obtuse Marginal Branch 1st Mrg lesion is 99% stenosed.  Second Obtuse Marginal Branch 2nd Mrg lesion is 99% stenosed.  Third Obtuse Marginal Branch 3rd Mrg lesion is 90% stenosed.  Right Coronary Artery There is moderate diffuse disease throughout the vessel. Prox RCA lesion is 90% stenosed.  Intervention  No interventions have been documented.     ECHOCARDIOGRAM  ECHOCARDIOGRAM COMPLETE 09/20/2023  Narrative ECHOCARDIOGRAM REPORT    Patient Name:   Jesse Coleman Date of Exam: 09/20/2023 Medical Rec #:  [id number]      Height:       70.0 in Accession #:    [id number]     Weight:       197.0 lb Date of Birth:  Feb 16, 1956      BSA:          2.074 m Patient Age:    24 years       BP:           141/72 mmHg Patient Gender: M              HR:           80 bpm. Exam Location:  Inpatient  Procedure: 2D Echo, 3D Echo, Cardiac Doppler and Color Doppler  Indications:    Dyspnea R06.00  History:        Patient has prior history of Echocardiogram examinations, most recent 06/10/2019. Cardiomyopathy, Previous Myocardial Infarction and CAD, Signs/Symptoms:Chest Pain; Risk Factors:Dyslipidemia.  Sonographer:    Thea Norlander RCS Referring Phys: [id number] GORDY BERGAMO  IMPRESSIONS   1. Left ventricular  ejection fraction, by estimation, is 50 to 55%. Left ventricular ejection fraction by 3D volume is 52 %. The left ventricle has low normal function. The left ventricle demonstrates regional wall motion abnormalities (see scoring diagram/findings for description). Left ventricular diastolic parameters were normal. apical hypokinesis without thrombus. 2. Right ventricular systolic function is normal. The right ventricular size is normal. 3. The mitral valve is grossly normal. Mild mitral valve regurgitation. No evidence of mitral stenosis. 4. The aortic valve is tricuspid. Aortic valve regurgitation is trivial. No aortic stenosis is present.  Comparison(s): Prior images reviewed side by side. New apical wall motion abnormalities possible, apex not as well evaluated in 2020 study.  FINDINGS Left Ventricle: Left ventricular ejection fraction, by estimation, is 50 to 55%. Left ventricular ejection fraction by 3D volume is 52 %. The left ventricle has low normal function. The left ventricle demonstrates regional wall motion abnormalities. The left ventricular internal cavity size was normal in size. Suboptimal image quality limits for assessment of left ventricular hypertrophy. Left ventricular diastolic parameters were normal.   LV Wall Scoring: The apical lateral segment, apical septal segment, apical anterior segment, and  apical inferior segment are hypokinetic. The anterior wall, antero-lateral wall, anterior septum, inferior wall, posterior wall, mid inferoseptal segment, and basal inferoseptal segment are normal. Apical hypokinesis without thrombus.  Right Ventricle: The right ventricular size is normal. No increase in right ventricular wall thickness. Right ventricular systolic function is normal.  Left Atrium: Left atrial size was normal in size.  Right Atrium: Right atrial size was normal in size.  Pericardium: There is no evidence of pericardial effusion.  Mitral Valve: The mitral  valve is grossly normal. Mild mitral valve regurgitation. No evidence of mitral valve stenosis.  Tricuspid Valve: The tricuspid valve is normal in structure. Tricuspid valve regurgitation is trivial. No evidence of tricuspid stenosis.  Aortic Valve: The aortic valve is tricuspid. Aortic valve regurgitation is trivial. No aortic stenosis is present. Aortic valve peak gradient measures 6.0 mmHg.  Pulmonic Valve: The pulmonic valve was not well visualized. Pulmonic valve regurgitation is not visualized. No evidence of pulmonic stenosis.  Aorta: The aortic root is normal in size and structure.  IAS/Shunts: The atrial septum is grossly normal.   LEFT VENTRICLE PLAX 2D LVIDd:         4.80 cm         Diastology LVIDs:         3.50 cm         LV e' medial:    7.51 cm/s LV PW:         1.10 cm         LV E/e' medial:  8.7 LV IVS:        1.40 cm         LV e' lateral:   9.03 cm/s LVOT diam:     2.00 cm         LV E/e' lateral: 7.3 LV SV:         52 LV SV Index:   25 LVOT Area:     3.14 cm        3D Volume EF LV 3D EF:    Left ventricul ar ejection fraction by 3D volume is 52 %.  3D Volume EF: 3D EF:        52 % LV EDV:       184 ml LV ESV:       88 ml LV SV:        96 ml  RIGHT VENTRICLE RV S prime:     10.80 cm/s TAPSE (M-mode): 1.5 cm  LEFT ATRIUM             Index        RIGHT ATRIUM           Index LA diam:        4.80 cm 2.31 cm/m   RA Area:     11.40 cm LA Vol (A2C):   51.6 ml 24.88 ml/m  RA Volume:   18.20 ml  8.78 ml/m LA Vol (A4C):   49.7 ml 23.96 ml/m LA Biplane Vol: 52.9 ml 25.51 ml/m AORTIC VALVE AV Area (Vmax): 2.58 cm AV Vmax:        122.00 cm/s AV Peak Grad:   6.0 mmHg LVOT Vmax:      100.23 cm/s LVOT Vmean:     68.733 cm/s LVOT VTI:       0.165 m  AORTA Ao Root diam: 3.40 cm  MITRAL VALVE MV Area (PHT): 6.07 cm    SHUNTS MV Decel Time: 125 msec    Systemic VTI:  0.17  m MR Peak grad: 67.9 mmHg    Systemic Diam: 2.00 cm MR Vmax:      412.00  cm/s MV E velocity: 65.50 cm/s  Stanly Leavens MD Electronically signed by Stanly Leavens MD Signature Date/Time: 09/20/2023/5:17:41 PM    Final          ______________________________________________________________________________________________         Risk Assessment/Calculations:    CHA2DS2-VASc Score = 3   This indicates a 3.2% annual risk of stroke. The patient's score is based upon: CHF History: 0 HTN History: 1 Diabetes History: 0 Stroke History: 0 Vascular Disease History: 1 Age Score: 1 Gender Score: 0           Physical Exam:   VS:  BP 118/64 (BP Location: Right Arm, Patient Position: Sitting, Cuff Size: Normal)   Pulse 65   Ht 5' 10 (1.778 m)   Wt 203 lb 4.8 oz (92.2 kg)   SpO2 96%   BMI 29.17 kg/m    Wt Readings from Last 3 Encounters:  10/12/24 203 lb 4.8 oz (92.2 kg)  08/31/24 204 lb (92.5 kg)  06/09/24 213 lb (96.6 kg)    GEN: Well nourished, well developed in no acute distress NECK: No JVD; No carotid bruits CARDIAC: RRR, no murmurs, rubs, gallops. Midsternal incision with approximated edges. RESPIRATORY:  Clear to auscultation without rales, wheezing or rhonchi  ABDOMEN: Soft, non-tender, non-distended EXTREMITIES:  No edema; No deformity.   ASSESSMENT AND PLAN: .    CAD s/p NSTEMI and CABG x4 09/23/23 - Stable with no anginal symptoms. No indication for ischemic evaluation.  GDMT Plavix  75mg  daily, Metoprolol  tartrate 25mg  daily (no evening papitations, prefers to keep daily dosing) , Zetia  10mg  daily. Intolerance to Rosuvastatin  with myalgias. Recommend aiming for 150 minutes of moderate intensity activity per week and following a heart healthy diet.    Post operative atrial fibrillation - no longer on amiodarone , no recurrent PAF.   History of unprovoked PE / Hypercoagualble state - Continue OAC.Recommend adjust Eliquis  from 5mg  daily to 2.5mg  BID. Rx sent.  HLD, LDL goal <55 - 12/2023 LDL 64. Elevated Lp(a) consistent  with familial hyperlipidemia. Intolerant to Rosuvastatin . Previously uninterested in PCSK9i or Leqvio. Continue zetia  10mg  daily. Has follow up labs with primary care in a couple months.   HTN -  BP well controlled. Continue current antihypertensive regimen.          Dispo: follow up in 1 year with Reche GORMAN Finder, NP   Signed, Reche GORMAN Finder, NP   "

## 2024-10-13 ENCOUNTER — Inpatient Hospital Stay: Admission: RE | Admit: 2024-10-13 | Discharge: 2024-10-13 | Attending: Family Medicine | Admitting: Family Medicine

## 2024-10-13 DIAGNOSIS — R222 Localized swelling, mass and lump, trunk: Secondary | ICD-10-CM

## 2024-10-14 ENCOUNTER — Ambulatory Visit (HOSPITAL_COMMUNITY)

## 2024-11-02 ENCOUNTER — Encounter: Payer: Self-pay | Admitting: Endocrinology
